# Patient Record
Sex: Female | Born: 1950
Health system: Southern US, Community
[De-identification: ages and names within clinical notes are randomized; demographics above are authoritative.]

## PROBLEM LIST (undated history)

## (undated) DIAGNOSIS — I4510 Unspecified right bundle-branch block: Secondary | ICD-10-CM

## (undated) DIAGNOSIS — J45909 Unspecified asthma, uncomplicated: Secondary | ICD-10-CM

## (undated) DIAGNOSIS — F419 Anxiety disorder, unspecified: Secondary | ICD-10-CM

## (undated) DIAGNOSIS — M1990 Unspecified osteoarthritis, unspecified site: Secondary | ICD-10-CM

## (undated) DIAGNOSIS — E039 Hypothyroidism, unspecified: Secondary | ICD-10-CM

## (undated) DIAGNOSIS — H1010 Acute atopic conjunctivitis, unspecified eye: Secondary | ICD-10-CM

## (undated) DIAGNOSIS — F330 Major depressive disorder, recurrent, mild: Secondary | ICD-10-CM

## (undated) DIAGNOSIS — G4730 Sleep apnea, unspecified: Secondary | ICD-10-CM

## (undated) DIAGNOSIS — G2581 Restless legs syndrome: Secondary | ICD-10-CM

## (undated) DIAGNOSIS — I10 Essential (primary) hypertension: Secondary | ICD-10-CM

## (undated) DIAGNOSIS — E7800 Pure hypercholesterolemia, unspecified: Secondary | ICD-10-CM

## (undated) DIAGNOSIS — K5792 Diverticulitis of intestine, part unspecified, without perforation or abscess without bleeding: Secondary | ICD-10-CM

## (undated) DIAGNOSIS — H409 Unspecified glaucoma: Secondary | ICD-10-CM

## (undated) DIAGNOSIS — G43909 Migraine, unspecified, not intractable, without status migrainosus: Secondary | ICD-10-CM

## (undated) DIAGNOSIS — H269 Unspecified cataract: Secondary | ICD-10-CM

## (undated) DIAGNOSIS — R413 Other amnesia: Secondary | ICD-10-CM

## (undated) DIAGNOSIS — C4490 Unspecified malignant neoplasm of skin, unspecified: Secondary | ICD-10-CM

## (undated) DIAGNOSIS — R079 Chest pain, unspecified: Secondary | ICD-10-CM

## (undated) DIAGNOSIS — K219 Gastro-esophageal reflux disease without esophagitis: Secondary | ICD-10-CM

## (undated) DIAGNOSIS — A64 Unspecified sexually transmitted disease: Secondary | ICD-10-CM

## (undated) DIAGNOSIS — R011 Cardiac murmur, unspecified: Secondary | ICD-10-CM

## (undated) DIAGNOSIS — L4050 Arthropathic psoriasis, unspecified: Secondary | ICD-10-CM

## (undated) DIAGNOSIS — F329 Major depressive disorder, single episode, unspecified: Secondary | ICD-10-CM

## (undated) DIAGNOSIS — M8580 Other specified disorders of bone density and structure, unspecified site: Secondary | ICD-10-CM

## (undated) DIAGNOSIS — F32A Depression, unspecified: Secondary | ICD-10-CM

## (undated) DIAGNOSIS — G4700 Insomnia, unspecified: Secondary | ICD-10-CM

## (undated) DIAGNOSIS — J309 Allergic rhinitis, unspecified: Secondary | ICD-10-CM

## (undated) HISTORY — DX: Essential (primary) hypertension: I10

## (undated) HISTORY — DX: Restless legs syndrome: G25.81

## (undated) HISTORY — DX: Gastro-esophageal reflux disease without esophagitis: K21.9

## (undated) HISTORY — DX: Chest pain, unspecified: R07.9

## (undated) HISTORY — PX: SKIN CANCER EXCISION: SHX779

## (undated) HISTORY — DX: Pure hypercholesterolemia, unspecified: E78.00

## (undated) HISTORY — DX: Anxiety disorder, unspecified: F41.9

## (undated) HISTORY — DX: Arthropathic psoriasis, unspecified: L40.50

## (undated) HISTORY — DX: Unspecified malignant neoplasm of skin, unspecified: C44.90

## (undated) HISTORY — DX: Unspecified sexually transmitted disease: A64

## (undated) HISTORY — DX: Unspecified osteoarthritis, unspecified site: M19.90

## (undated) HISTORY — DX: Migraine, unspecified, not intractable, without status migrainosus: G43.909

## (undated) HISTORY — DX: Hypothyroidism, unspecified: E03.9

## (undated) HISTORY — DX: Other amnesia: R41.3

## (undated) HISTORY — PX: GANGLION CYST EXCISION: SHX1691

## (undated) HISTORY — DX: Unspecified cataract: H26.9

## (undated) HISTORY — DX: Unspecified right bundle-branch block: I45.10

## (undated) HISTORY — DX: Major depressive disorder, recurrent, mild: F33.0

## (undated) HISTORY — DX: Unspecified glaucoma: H40.9

## (undated) HISTORY — PX: BELPHAROPTOSIS REPAIR: SHX369

## (undated) HISTORY — DX: Diverticulitis of intestine, part unspecified, without perforation or abscess without bleeding: K57.92

## (undated) HISTORY — DX: Other specified disorders of bone density and structure, unspecified site: M85.80

## (undated) HISTORY — DX: Major depressive disorder, single episode, unspecified: F32.9

## (undated) HISTORY — DX: Depression, unspecified: F32.A

## (undated) HISTORY — PX: BREAST SURGERY: SHX581

## (undated) HISTORY — DX: Acute atopic conjunctivitis, unspecified eye: H10.10

## (undated) HISTORY — DX: Sleep apnea, unspecified: G47.30

## (undated) HISTORY — PX: FOOT SURGERY: SHX648

## (undated) HISTORY — DX: Insomnia, unspecified: G47.00

## (undated) HISTORY — DX: Unspecified asthma, uncomplicated: J45.909

## (undated) HISTORY — PX: HEMORRHOID SURGERY: SHX153

## (undated) HISTORY — DX: Allergic rhinitis, unspecified: J30.9

---

## 1999-01-05 ENCOUNTER — Ambulatory Visit (HOSPITAL_COMMUNITY): Admission: RE | Admit: 1999-01-05 | Discharge: 1999-01-05 | Payer: Self-pay | Admitting: *Deleted

## 2000-06-07 ENCOUNTER — Ambulatory Visit (HOSPITAL_COMMUNITY): Admission: RE | Admit: 2000-06-07 | Discharge: 2000-06-07 | Payer: Self-pay | Admitting: Obstetrics and Gynecology

## 2000-06-07 ENCOUNTER — Other Ambulatory Visit: Admission: RE | Admit: 2000-06-07 | Discharge: 2000-06-07 | Payer: Self-pay | Admitting: Obstetrics and Gynecology

## 2000-06-07 ENCOUNTER — Encounter: Payer: Self-pay | Admitting: Obstetrics and Gynecology

## 2001-06-12 ENCOUNTER — Ambulatory Visit (HOSPITAL_COMMUNITY): Admission: RE | Admit: 2001-06-12 | Discharge: 2001-06-12 | Payer: Self-pay | Admitting: Obstetrics and Gynecology

## 2001-06-12 ENCOUNTER — Encounter: Payer: Self-pay | Admitting: Obstetrics and Gynecology

## 2001-06-19 ENCOUNTER — Other Ambulatory Visit: Admission: RE | Admit: 2001-06-19 | Discharge: 2001-06-19 | Payer: Self-pay | Admitting: Obstetrics and Gynecology

## 2001-10-01 ENCOUNTER — Encounter: Admission: RE | Admit: 2001-10-01 | Discharge: 2001-10-01 | Payer: Self-pay | Admitting: Obstetrics and Gynecology

## 2001-10-01 ENCOUNTER — Encounter: Payer: Self-pay | Admitting: Obstetrics and Gynecology

## 2001-10-08 ENCOUNTER — Ambulatory Visit (HOSPITAL_BASED_OUTPATIENT_CLINIC_OR_DEPARTMENT_OTHER): Admission: RE | Admit: 2001-10-08 | Discharge: 2001-10-08 | Payer: Self-pay | Admitting: General Surgery

## 2001-10-08 ENCOUNTER — Encounter (INDEPENDENT_AMBULATORY_CARE_PROVIDER_SITE_OTHER): Payer: Self-pay | Admitting: *Deleted

## 2002-07-24 ENCOUNTER — Other Ambulatory Visit: Admission: RE | Admit: 2002-07-24 | Discharge: 2002-07-24 | Payer: Self-pay | Admitting: Obstetrics and Gynecology

## 2002-10-02 ENCOUNTER — Ambulatory Visit (HOSPITAL_COMMUNITY): Admission: RE | Admit: 2002-10-02 | Discharge: 2002-10-02 | Payer: Self-pay | Admitting: Obstetrics and Gynecology

## 2002-10-02 ENCOUNTER — Encounter: Payer: Self-pay | Admitting: Obstetrics and Gynecology

## 2003-10-07 ENCOUNTER — Other Ambulatory Visit: Admission: RE | Admit: 2003-10-07 | Discharge: 2003-10-07 | Payer: Self-pay | Admitting: Obstetrics and Gynecology

## 2003-10-07 ENCOUNTER — Ambulatory Visit (HOSPITAL_COMMUNITY): Admission: RE | Admit: 2003-10-07 | Discharge: 2003-10-07 | Payer: Self-pay | Admitting: Obstetrics and Gynecology

## 2004-12-07 ENCOUNTER — Ambulatory Visit (HOSPITAL_COMMUNITY): Admission: RE | Admit: 2004-12-07 | Discharge: 2004-12-07 | Payer: Self-pay | Admitting: Obstetrics and Gynecology

## 2004-12-07 ENCOUNTER — Other Ambulatory Visit: Admission: RE | Admit: 2004-12-07 | Discharge: 2004-12-07 | Payer: Self-pay | Admitting: Obstetrics and Gynecology

## 2005-12-21 ENCOUNTER — Other Ambulatory Visit: Admission: RE | Admit: 2005-12-21 | Discharge: 2005-12-21 | Payer: Self-pay | Admitting: Obstetrics & Gynecology

## 2005-12-21 ENCOUNTER — Ambulatory Visit (HOSPITAL_COMMUNITY): Admission: RE | Admit: 2005-12-21 | Discharge: 2005-12-21 | Payer: Self-pay | Admitting: Obstetrics & Gynecology

## 2006-11-15 ENCOUNTER — Emergency Department (HOSPITAL_COMMUNITY): Admission: EM | Admit: 2006-11-15 | Discharge: 2006-11-16 | Payer: Self-pay | Admitting: Emergency Medicine

## 2007-05-30 ENCOUNTER — Other Ambulatory Visit: Admission: RE | Admit: 2007-05-30 | Discharge: 2007-05-30 | Payer: Self-pay | Admitting: Obstetrics & Gynecology

## 2007-09-10 ENCOUNTER — Ambulatory Visit (HOSPITAL_COMMUNITY): Admission: RE | Admit: 2007-09-10 | Discharge: 2007-09-10 | Payer: Self-pay | Admitting: Obstetrics & Gynecology

## 2008-06-05 ENCOUNTER — Other Ambulatory Visit: Admission: RE | Admit: 2008-06-05 | Discharge: 2008-06-05 | Payer: Self-pay | Admitting: Obstetrics & Gynecology

## 2008-10-02 ENCOUNTER — Ambulatory Visit (HOSPITAL_COMMUNITY): Admission: RE | Admit: 2008-10-02 | Discharge: 2008-10-02 | Payer: Self-pay | Admitting: Obstetrics & Gynecology

## 2009-10-06 ENCOUNTER — Ambulatory Visit (HOSPITAL_COMMUNITY): Admission: RE | Admit: 2009-10-06 | Discharge: 2009-10-06 | Payer: Self-pay | Admitting: Obstetrics & Gynecology

## 2009-10-22 ENCOUNTER — Ambulatory Visit (HOSPITAL_COMMUNITY): Admission: RE | Admit: 2009-10-22 | Discharge: 2009-10-22 | Payer: Self-pay | Admitting: Obstetrics & Gynecology

## 2010-10-29 ENCOUNTER — Other Ambulatory Visit: Payer: Self-pay | Admitting: Obstetrics & Gynecology

## 2010-10-29 DIAGNOSIS — Z1231 Encounter for screening mammogram for malignant neoplasm of breast: Secondary | ICD-10-CM

## 2010-11-25 ENCOUNTER — Ambulatory Visit (HOSPITAL_COMMUNITY)
Admission: RE | Admit: 2010-11-25 | Discharge: 2010-11-25 | Disposition: A | Discharge status: Home / Self Care | Payer: BC Managed Care – PPO | Source: Ambulatory Visit | Attending: Obstetrics & Gynecology | Admitting: Obstetrics & Gynecology

## 2010-11-25 DIAGNOSIS — Z1231 Encounter for screening mammogram for malignant neoplasm of breast: Secondary | ICD-10-CM | POA: Insufficient documentation

## 2011-01-07 NOTE — Op Note (Signed)
[place]. [facility]  Patient:    AMITY, ROES Visit Number: [id number] MRN: [id number]          Service Type: DSU Location: [facility] Attending Physician:  Janalyn Rouse Dictated by:   Rose Phi. Maple Hudson, M.D. Proc. Date: 10/08/01 Admit Date:  10/08/2001   CC:         Cynthia P. Ashley Royalty, M.D.                           Operative Report  PREOPERATIVE DIAGNOSIS:  Intraductal papilloma of the left breast.  POSTOPERATIVE DIAGNOSIS:  Intraductal papilloma of the left breast.  PROCEDURE:  Excision of duct system at the 6 oclock position of the left breast.  SURGEON:  Rose Phi. Maple Hudson, M.D.  ANESTHESIA:  MAC.  INDICATIONS:  The patient had presented with a nipple discharge from the 6 oclock position of the left breast and an intraductal papilloma, subareolar in position, had been identified on a ductogram.  She was scheduled for excision.  DESCRIPTION OF PROCEDURE:  The patient was placed on the operating table with the left arm extended on the arm board.  The left breast was prepped and draped in the usual fashion.  A circumareolar incision centered at the 6 oclock position was then outlined and infiltrated with 1% Xylocaine with adrenalin.  The incision was made and the duct system going to the 6 oclock position was excised.  One could then see the papilloma extruding from the duct.  No others could be identified.  Hemostasis was obtained with the cautery.  A subcuticular closure of 4-0 Monocryl and Steri-Strips were carried out.  Dressing was applied.  The patient was transferred to the recovery room in satisfactory condition having tolerated the procedure well. Dictated by: Rose Phi. Maple Hudson, M.D. Attending Physician:  Janalyn Rouse DD:  10/08/01 TD:  10/08/01 Job: [id number]

## 2012-11-16 ENCOUNTER — Telehealth: Payer: Self-pay | Admitting: Certified Nurse Midwife

## 2012-11-16 NOTE — Telephone Encounter (Signed)
PT IS REQUESTING REFILL FOR ESTRACE BE CALLED INTO A DIFFERENT PHARMACY PLEASE CALL THIS INTO [facility] @  [phone]

## 2012-11-16 NOTE — Telephone Encounter (Signed)
11/16/12 left message for patient to callback. Pt should be able to call previous pharmacy & just have rx transferred

## 2012-11-16 NOTE — Telephone Encounter (Signed)
11/16/12 @ 4:34pm left message for patient letting her know that she can call the pharmacy her rx is currently at & have it transferred to her pharmacy in [place]. Pt told to callback if she has any problems with this.

## 2012-11-21 ENCOUNTER — Telehealth: Payer: Self-pay | Admitting: Certified Nurse Midwife

## 2012-11-21 NOTE — Telephone Encounter (Signed)
Pt needs to have her MMG completed. She wants to go to [facility] in [place]. She needs an order put in to have it completed.

## 2012-11-22 NOTE — Telephone Encounter (Signed)
SEE NOTE BELOW, IS THIS OK TO FAX FORMS TO [facility]. CHART ON YOUR DESK. LAST MAMMOGRAM   11/2011. LAST AEX 02/14/2012  SUE

## 2012-11-23 ENCOUNTER — Telehealth: Payer: Self-pay | Admitting: *Deleted

## 2012-11-26 ENCOUNTER — Telehealth: Payer: Self-pay | Admitting: *Deleted

## 2012-11-26 NOTE — Telephone Encounter (Signed)
Patient request mammogram to be done at [facility] which is closer to her home. Signed order form per Presley Raddle, and faxed to [facility]. Fax confirmed 11/23/2012  sue

## 2012-11-26 NOTE — Telephone Encounter (Signed)
Patient scheduled for digital Screening Mammogram on 12/08/2012 @ 9:30am @ [facility]. Patient aware of this. Chart in file till report comes back. Olivia Cruz

## 2013-02-14 ENCOUNTER — Encounter: Payer: Self-pay | Admitting: *Deleted

## 2013-02-18 ENCOUNTER — Ambulatory Visit (INDEPENDENT_AMBULATORY_CARE_PROVIDER_SITE_OTHER): Payer: BC Managed Care – PPO | Admitting: Certified Nurse Midwife

## 2013-02-18 ENCOUNTER — Encounter: Payer: Self-pay | Admitting: Certified Nurse Midwife

## 2013-02-18 VITALS — BP 102/62 | HR 64 | Resp 16 | Ht 61.25 in | Wt 134.0 lb

## 2013-02-18 DIAGNOSIS — A6000 Herpesviral infection of urogenital system, unspecified: Secondary | ICD-10-CM

## 2013-02-18 DIAGNOSIS — N952 Postmenopausal atrophic vaginitis: Secondary | ICD-10-CM

## 2013-02-18 DIAGNOSIS — Z0000 Encounter for general adult medical examination without abnormal findings: Secondary | ICD-10-CM

## 2013-02-18 DIAGNOSIS — A6009 Herpesviral infection of other urogenital tract: Secondary | ICD-10-CM

## 2013-02-18 DIAGNOSIS — Z01419 Encounter for gynecological examination (general) (routine) without abnormal findings: Secondary | ICD-10-CM

## 2013-02-18 LAB — LIPID PANEL
Cholesterol: 123 mg/dL (ref 0–200)
HDL: 59 mg/dL (ref >39)
LDL Cholesterol: 48 mg/dL (ref 0–99)
Total CHOL/HDL Ratio: 2.1 Ratio
Triglycerides: 82 mg/dL (ref <150)
VLDL: 16 mg/dL (ref 0–40)

## 2013-02-18 LAB — COMPREHENSIVE METABOLIC PANEL
ALT: 16 U/L (ref 0–35)
AST: 26 U/L (ref 0–37)
Albumin: 4.3 g/dL (ref 3.5–5.2)
Alkaline Phosphatase: 73 U/L (ref 39–117)
BUN: 14 mg/dL (ref 6–23)
CO2: 27 mEq/L (ref 19–32)
Calcium: 9 mg/dL (ref 8.4–10.5)
Chloride: 106 mEq/L (ref 96–112)
Creat: 0.71 mg/dL (ref 0.50–1.10)
Glucose, Bld: 90 mg/dL (ref 70–99)
Potassium: 4.1 mEq/L (ref 3.5–5.3)
Sodium: 141 mEq/L (ref 135–145)
Total Bilirubin: 0.4 mg/dL (ref 0.3–1.2)
Total Protein: 6.6 g/dL (ref 6.0–8.3)

## 2013-02-18 LAB — POCT URINALYSIS DIPSTICK
Bilirubin, UA: NEGATIVE
Blood, UA: NEGATIVE
Glucose, UA: NEGATIVE
Ketones, UA: NEGATIVE
Leukocytes, UA: NEGATIVE
Nitrite, UA: NEGATIVE
Protein, UA: NEGATIVE
Urobilinogen, UA: NEGATIVE
pH, UA: 5

## 2013-02-18 LAB — TSH: TSH: 3.489 u[IU]/mL (ref 0.350–4.500)

## 2013-02-18 LAB — HEMOGLOBIN, FINGERSTICK: Hemoglobin, fingerstick: 14.6 g/dL (ref 12.0–16.0)

## 2013-02-18 MED ORDER — VALACYCLOVIR HCL 500 MG PO TABS: 500.0000 mg | ORAL_TABLET | Freq: Two times a day (BID) | ORAL | Status: DC

## 2013-02-18 MED ORDER — NITROFURANTOIN MONOHYD MACRO 100 MG PO CAPS: 100.0000 mg | ORAL_CAPSULE | ORAL | Status: DC | PRN

## 2013-02-18 MED ORDER — ESTRADIOL 0.1 MG/GM VA CREA: 1.0000 g | TOPICAL_CREAM | VAGINAL | Status: DC

## 2013-02-18 NOTE — Progress Notes (Signed)
62 y.o. G4P2 Married Caucasian Fe here for annual exam.  Menopausal no HRT. Denies vaginal bleeding.  Estrace cream working well for vaginal dryness and UTI prevention. Using Macrobid for post coital use with last UTI 8/13! Spouse with new diagnosis of diabetes, but patient working with him more since retiring this year. Patient had dog bite earlier this year with treatment(doing rescue dogs now), so saw PCP. No health issues today. Sees PCP prn only.  No LMP recorded. Patient is postmenopausal.          Sexually active: yes  The current method of family planning is vasectomy.    Exercising: yes  walking & bike Smoker:  no  Health Maintenance: Pap: 02-14-12 neg HPV HR NEG MMG:  12/04/12 normal Colonoscopy:  2004 BMD:   2011 TDaP:  4/14 Labs: Poct urine-neg , Hgb-14.6 Self breast exam: not done     Past Medical History  Diagnosis Date  . STD (sexually transmitted disease)     HSV II  . Insomnia   . Osteoarthritis   . Anxiety   . Depression   . Migraines     No past surgical history on file.  Current Outpatient Prescriptions  Medication Sig Dispense Refill  . calcium-vitamin D (OSCAL WITH D) 500-200 MG-UNIT per tablet Take 1 tablet by mouth.      . citalopram (CELEXA) 10 MG tablet Take 10 mg by mouth daily.      Marland Kitchen estradiol (ESTRACE) 0.1 MG/GM vaginal cream Place 2 g vaginally daily.      . fish oil-omega-3 fatty acids 1000 MG capsule Take 2 g by mouth daily.      . Multiple Vitamin (MULTIVITAMIN) capsule Take 1 capsule by mouth daily.      . nitrofurantoin, macrocrystal-monohydrate, (MACROBID) 100 MG capsule Take 100 mg by mouth 2 (two) times daily.      . TRAZODONE HCL PO Take by mouth.      . valACYclovir (VALTREX) 500 MG tablet Take 500 mg by mouth 2 (two) times daily.       No current facility-administered medications for this visit.    Family History  Problem Relation Age of Onset  . Thyroid disease Sister   . Cancer Maternal Grandmother     leukemia  . Multiple  births Paternal Grandmother     ROS:  Pertinent items are noted in HPI.  Otherwise, a comprehensive ROS was negative.  Exam:   There were no vitals taken for this visit.    Ht Readings from Last 3 Encounters:  No data found for Ht    General appearance: alert, cooperative and appears stated age Head: Normocephalic, without obvious abnormality, atraumatic Neck: no adenopathy, supple, symmetrical, trachea midline and thyroid normal to inspection and palpation Lungs: clear to auscultation bilaterally Breasts: normal appearance, no masses or tenderness, No nipple retraction or dimpling, No nipple discharge or bleeding, No axillary or supraclavicular adenopathy Heart: regular rate and rhythm Abdomen: soft, non-tender; no masses,  no organomegaly Extremities: extremities normal, atraumatic, no cyanosis or edema Skin: Skin color, texture, turgor normal. No rashes or lesions Lymph nodes: Cervical, supraclavicular, and axillary nodes normal. No abnormal inguinal nodes palpated Neurologic: Grossly normal   Pelvic: External genitalia:  no lesions              Urethra:  normal appearing urethra with no masses, tenderness or lesions              Bartholin's and Skene's: normal  Vagina: normal appearing vagina with normal color and discharge, no lesions              Cervix: normal, non tender              Pap taken: no Bimanual Exam:  Uterus:  normal size, contour, position, consistency, mobility, non-tender and anteflexed              Adnexa: normal adnexa and no mass, fullness, tenderness               Rectovaginal: Confirms               Anus:  normal sphincter tone, no lesions  A:  Well Woman with normal exam  Menopausal no HRT  Atrophic Vaginitis using Estrace cream with improvement  History of post-coital UTI, Macrobid working well  Family history of breast cancer(sister 60)  History of HSV II on valtrex for outbreak only needs Rx  Social stress with spouse  diabetes  P:  Reviewed health and wellness pertinent to exam  Aware of need to notify if any vaginal bleeding  Rx Estrace Cream see order  Discussed continued use post coital as needed  Rx Macrobid see order  Stressed importance of mammogram and self breast exam  Rx Valtrex  Discussed working with spouse and care provider. Seek support with  family  Fasting labs: CMP,Lipid panel, Vit.D, TSH   Pap smear as per guidelines Pap next year  Mammogram yearly  Due for colonoscopy this year has received her card to schedule,plans to call pap smear not taken today  counseled on breast self exam, mammography screening, menopause, adequate intake of calcium and vitamin D, diet and exercise  return annually or prn  An After Visit Summary was printed and given to the patient.

## 2013-02-18 NOTE — Patient Instructions (Signed)

## 2013-02-19 LAB — VITAMIN D 25 HYDROXY (VIT D DEFICIENCY, FRACTURES): Vit D, 25-Hydroxy: 60 ng/mL (ref 30–89)

## 2013-02-19 NOTE — Progress Notes (Signed)
Reviewed CPRomine, MD 

## 2013-02-20 ENCOUNTER — Telehealth: Payer: Self-pay

## 2013-02-20 NOTE — Telephone Encounter (Signed)
Message copied by Eliezer Bottom on Wed Feb 20, 2013  9:09 AM ------      Message from: Verner Chol      Created: Wed Feb 20, 2013  8:00 AM       Notify lipid panel normal, liver, kidney and glucose panel normal      TSH normal, Vitamin D normal ------

## 2013-02-20 NOTE — Telephone Encounter (Signed)
lmtcb

## 2013-02-20 NOTE — Telephone Encounter (Signed)
Patient notified of results.

## 2013-06-03 HISTORY — PX: COLONOSCOPY: SHX174

## 2013-11-25 ENCOUNTER — Encounter: Payer: Self-pay | Admitting: Gastroenterology

## 2013-11-25 ENCOUNTER — Telehealth: Payer: Self-pay | Admitting: Certified Nurse Midwife

## 2013-11-25 NOTE — Telephone Encounter (Signed)
Spoke with patient. Advised of Estrace cream savings card. Patient would like to try savings card before switching medications stating that "Estrace has really worked well for me but I just can't afford to pay for it every month." Advised she could try savings card and give [place] a call back if she would like to take a different route.Went on [employer] and printed out Estrace savings card for patient. Offered to leave at front desk for pick up or that patient could get one online. Patient states that she would try to come by today and pick it up from our office. Savings card left at front desk.   Routing to provider for final review. Patient agreeable to disposition. Will close encounter

## 2013-11-25 NOTE — Telephone Encounter (Signed)
Patient wants to know if there is another medication for estrace it is too high and she cant afford it.

## 2013-11-26 ENCOUNTER — Telehealth: Payer: Self-pay | Admitting: Emergency Medicine

## 2013-11-26 NOTE — Telephone Encounter (Signed)
Encounter opened erroneously.   Closed encounter.   

## 2013-11-26 NOTE — Telephone Encounter (Signed)
Agree with plan 

## 2013-11-27 ENCOUNTER — Telehealth: Payer: Self-pay | Admitting: Certified Nurse Midwife

## 2013-11-27 NOTE — Telephone Encounter (Signed)
There is nothing else that will be less expensive that I am aware of.

## 2013-11-27 NOTE — Telephone Encounter (Signed)
Called [employer] and [employer] savings plans. Patient has high deducible plan from [facility] with 11,000.00 deductible and has so far met $204.16 of deductible. Will advise patient that Estrace is a 90 day supply.   Message left to return call to [place] at [phone].

## 2013-11-27 NOTE — Telephone Encounter (Signed)
Pt called to see what kind of discount card we had for her. She went to the pharmacy with a discount card she printed from the Internet and was told this discount card is based off of the kind of insurance you have. The discount is still not enough because she will have to pay$159.00. What else can she use?

## 2013-11-27 NOTE — Telephone Encounter (Signed)
Regina Eck CNM, Is there a similar medication we could offer patient to reduce cost as the savings card we have for her is the same one she used at pharmacy?

## 2013-11-28 NOTE — Telephone Encounter (Signed)
Spoke with patient. Advised that we contacted both [employer]bs and [employer] and this choice with savings cards with be most cost effective as patient has high deductible plan. Advised that Estrace tube will be 90 supply and she will need to pay at cost until she meets deductible. She is agreeable to plan and will be seen for AEX to discuss further options with Regina Eck CNM  Routing to provider for final review. Patient agreeable to disposition. Will close encounter

## 2014-02-19 ENCOUNTER — Encounter: Payer: Self-pay | Admitting: Certified Nurse Midwife

## 2014-02-19 ENCOUNTER — Ambulatory Visit (INDEPENDENT_AMBULATORY_CARE_PROVIDER_SITE_OTHER): Payer: BC Managed Care – PPO | Admitting: Certified Nurse Midwife

## 2014-02-19 VITALS — BP 110/64 | HR 64 | Resp 16 | Ht 61.0 in | Wt 133.0 lb

## 2014-02-19 DIAGNOSIS — N393 Stress incontinence (female) (male): Secondary | ICD-10-CM

## 2014-02-19 DIAGNOSIS — IMO0002 Reserved for concepts with insufficient information to code with codable children: Secondary | ICD-10-CM

## 2014-02-19 DIAGNOSIS — N8111 Cystocele, midline: Secondary | ICD-10-CM

## 2014-02-19 DIAGNOSIS — N952 Postmenopausal atrophic vaginitis: Secondary | ICD-10-CM

## 2014-02-19 DIAGNOSIS — Z01419 Encounter for gynecological examination (general) (routine) without abnormal findings: Secondary | ICD-10-CM

## 2014-02-19 DIAGNOSIS — N390 Urinary tract infection, site not specified: Secondary | ICD-10-CM

## 2014-02-19 DIAGNOSIS — Z0000 Encounter for general adult medical examination without abnormal findings: Secondary | ICD-10-CM

## 2014-02-19 DIAGNOSIS — Z124 Encounter for screening for malignant neoplasm of cervix: Secondary | ICD-10-CM

## 2014-02-19 LAB — POCT URINALYSIS DIPSTICK
Bilirubin, UA: NEGATIVE
Blood, UA: NEGATIVE
Glucose, UA: NEGATIVE
Ketones, UA: NEGATIVE
Leukocytes, UA: NEGATIVE
Nitrite, UA: NEGATIVE
Protein, UA: NEGATIVE
Urobilinogen, UA: NEGATIVE
pH, UA: 5

## 2014-02-19 LAB — HEMOGLOBIN, FINGERSTICK: Hemoglobin, fingerstick: 14.2 g/dL (ref 12.0–16.0)

## 2014-02-19 MED ORDER — ESTRADIOL 0.1 MG/GM VA CREA: 1.0000 g | TOPICAL_CREAM | VAGINAL | Status: DC

## 2014-02-19 MED ORDER — NITROFURANTOIN MONOHYD MACRO 100 MG PO CAPS: 100.0000 mg | ORAL_CAPSULE | ORAL | Status: DC | PRN

## 2014-02-19 NOTE — Patient Instructions (Signed)

## 2014-02-19 NOTE — Progress Notes (Signed)
63 y.o. [id number] Married Caucasian Fe here for annual exam. Menopausal vaginal bleeding or vaginal dryness. Patient using Estrace cream with good results. Patient using Macrobid for post coital UTI prevention, with no UTI in 6 months. Patient now being treated for URI/sinus from visit with PCP. Sees PCP for labs and aex. Stress incontinence has increased with coughing only, usually just occasionally. She has been working on her kegel exercise to help and has noticed improvement..No other health issues today. Has new grand daughter in [place]!!  Patient's last menstrual period was 08/22/2005.          Sexually active: Yes.    The current method of family planning is vasectomy.    Exercising: Yes.    walking Smoker:  no  Health Maintenance: Pap: 02-14-12 neg HPV HR neg MMG: 01-22-14 density category c, bi-rads category 1: neg Colonoscopy:  2014 repeat 5 yrs BMD:   2011 TDaP:  4/14 Labs: Poct urine-neg, Hgb-14.2 Self breast exam: done every other month   reports that she has quit smoking. She does not have any smokeless tobacco history on file. She reports that she does not drink alcohol or use illicit drugs.  Past Medical History  Diagnosis Date  . STD (sexually transmitted disease)     HSV II  . Insomnia   . Osteoarthritis   . Anxiety   . Depression   . Migraines     Past Surgical History  Procedure Laterality Date  . Breast surgery Left     times 2  . Ganglion cyst excision Left     foot  . Hemorrhoid surgery      Current Outpatient Prescriptions  Medication Sig Dispense Refill  . Calcium Carbonate-Vitamin D (CALCIUM + D PO) Take 1,200 mg by mouth daily.      Marland Kitchen estradiol (ESTRACE) 0.1 MG/GM vaginal cream Place 5.46 Applicatorfuls vaginally 2 (two) times a week. Use 1 gram Twice a week  42.5 g  4  . Multiple Vitamin (MULTIVITAMIN) capsule Take 1 capsule by mouth daily.      . nitrofurantoin, macrocrystal-monohydrate, (MACROBID) 100 MG capsule Take 1 capsule (100 mg total) by  mouth as needed. One  post coital as directed  30 capsule  2  . valACYclovir (VALTREX) 500 MG tablet Take 1 tablet (500 mg total) by mouth 2 (two) times daily. Take bid x 3 days at onset  30 tablet  1   No current facility-administered medications for this visit.    Family History  Problem Relation Age of Onset  . Breast cancer Sister     lung  . Cancer Maternal Grandmother   . Multiple births Maternal Grandmother   . Multiple births Paternal Grandmother   . Cancer Paternal Grandmother   . Thyroid disease Mother   . Heart failure Mother   . Parkinson's disease Father     ROS:  Pertinent items are noted in HPI.  Otherwise, a comprehensive ROS was negative.  Exam:   BP 110/64  Pulse 64  Resp 16  Ht 5\' 1"  (1.549 m)  Wt 133 lb (60.328 kg)  BMI 25.14 kg/m2  LMP 08/22/2005 Height: 5\' 1"  (154.9 cm)  Ht Readings from Last 3 Encounters:  02/19/14 5\' 1"  (1.549 m)  02/18/13 5' 1.25" (1.556 m)    General appearance: alert, cooperative and appears stated age Head: Normocephalic, without obvious abnormality, atraumatic Neck: no adenopathy, supple, symmetrical, trachea midline and thyroid normal to inspection and palpation and non-palpable Lungs: clear to auscultation bilaterally Breasts: normal  appearance, no masses or tenderness, No nipple retraction or dimpling, No nipple discharge or bleeding, No axillary or supraclavicular adenopathy Heart: regular rate and rhythm Abdomen: soft, non-tender; no masses,  no organomegaly Extremities: extremities normal, atraumatic, no cyanosis or edema Skin: Skin color, texture, turgor normal. No rashes or lesions Lymph nodes: Cervical, supraclavicular, and axillary nodes normal. No abnormal inguinal nodes palpated Neurologic: Grossly normal   Pelvic: External genitalia:  no lesions              Urethra:  normal appearing urethra with no masses, tenderness or lesions              Bartholin's and Skene's: normal                 Vagina: normal  appearing vagina with normal color and discharge, no lesions, mild cystocele noted              Cervix: normal, non tender              Pap taken: Yes.   Bimanual Exam:  Uterus:  normal size, contour, position, consistency, mobility, non-tender and anteverted              Adnexa: normal adnexa and no mass, fullness, tenderness               Rectovaginal: Confirms               Anus:  normal sphincter tone, no lesions  A:  Well Woman with normal exam  Menopausal no HRT  Atrophic vaginitis using Estrace cream 2 x weekly with good success  Post Coital UTI history with Macrobid use with good response  Category C density breasts on recent mammogram  URI/sinusitis unde treatment with PCP  Mild cystocele  P:   Reviewed health and wellness pertinent to exam  Aware of need to evaluate if vaginal bleeding  Rx Estrace see order  Rx Macrobid see order  Discussed importance of 3 D mammogram for better visualization with dense breast and etiology of.  Continue follow up as indicated  Discussed findings and need to continue to work with kegels, which were appropriate support on exam today. Limit holding urine for long periods of time which increases stress incontinence. Will advise if problems  Pap smear taken today with HPV reflex  Plans BMD with next mammogram   counseled on breast self exam, mammography screening, adequate intake of calcium and vitamin D, diet and exercise, Kegel's exercises  return annually or prn  An After Visit Summary was printed and given to the patient.

## 2014-02-24 LAB — IPS PAP TEST WITH REFLEX TO HPV

## 2014-02-24 NOTE — Progress Notes (Signed)
Reviewed personally.  M. Suzanne Shresta Risden, MD.  

## 2014-06-23 ENCOUNTER — Encounter: Payer: Self-pay | Admitting: Certified Nurse Midwife

## 2014-08-18 ENCOUNTER — Other Ambulatory Visit: Payer: Self-pay | Admitting: Certified Nurse Midwife

## 2014-08-18 DIAGNOSIS — A6009 Herpesviral infection of other urogenital tract: Secondary | ICD-10-CM

## 2014-08-18 MED ORDER — VALACYCLOVIR HCL 500 MG PO TABS: 500.0000 mg | ORAL_TABLET | Freq: Two times a day (BID) | ORAL | Status: DC

## 2014-08-18 NOTE — Telephone Encounter (Signed)
Pt is requesting a refill for valtrex. Pt has some outdated medication and will not have insurance for the next 5 months so she would like to request a refill now.

## 2014-08-18 NOTE — Telephone Encounter (Signed)
Medication refill request: Valtrex 500 mg  Last AEX:  02/19/14 with Ms. Debbie  Next AEX: 02/24/15 with Ms. Debbie Last MMG (if hormonal medication request): N/A Refill authorized: #[id number] rfs?, please advise.  Routed to [facility] since DL is out office today.

## 2014-08-19 NOTE — Telephone Encounter (Signed)
LM on patient's voicemail that rx has been sent.

## 2015-02-24 ENCOUNTER — Ambulatory Visit: Payer: BC Managed Care – PPO | Admitting: Certified Nurse Midwife

## 2015-03-16 ENCOUNTER — Telehealth: Payer: Self-pay | Admitting: Certified Nurse Midwife

## 2015-03-16 NOTE — Telephone Encounter (Signed)
Spoke with patient. Patient states that her and her husband had intercourse yesterday which was mildly painful. Went to the restroom after intercourse and noticed "Dark red" bleeding. Denies any current discomfort or pain. States she is still having spotting today. Advised will need to be seen in office for further evaluation. Offered appointment today but patient declines due to a prior engagement she has scheduled. Appointment scheduled for tomorrow 7/26 at 12:45pm with Regina Eck CNM. Patient is agreeable to date and time.  Routing to provider for final review. Patient agreeable to disposition. Will close encounter.   Patient aware provider will review message and nurse will return call if any additional advice or change of disposition.

## 2015-03-16 NOTE — Telephone Encounter (Signed)
Patient calling with "post menopausal bleeding after sexual intercourse yesterday."

## 2015-03-17 ENCOUNTER — Ambulatory Visit (INDEPENDENT_AMBULATORY_CARE_PROVIDER_SITE_OTHER): Payer: PPO | Admitting: Certified Nurse Midwife

## 2015-03-17 ENCOUNTER — Encounter: Payer: Self-pay | Admitting: Certified Nurse Midwife

## 2015-03-17 VITALS — BP 112/78 | HR 70 | Resp 16 | Ht 61.0 in | Wt 136.0 lb

## 2015-03-17 DIAGNOSIS — Z124 Encounter for screening for malignant neoplasm of cervix: Secondary | ICD-10-CM | POA: Diagnosis not present

## 2015-03-17 DIAGNOSIS — N950 Postmenopausal bleeding: Secondary | ICD-10-CM | POA: Diagnosis not present

## 2015-03-17 NOTE — Progress Notes (Signed)
64 y.o.Olivia Cruz female complaining of 2 episodes of vaginal bleeding. Noted last one after sexual activity, just brown color 2 days ago. She describes it as spotting and no pain when occurs..  She is not  on  HRT .  She has not had previous episodes of menopausal bleeding. She has been menopausal for  9 years.. Patient has not noted any vaginal dryness, but spouse is on medication to help with ED. Patient recently treated for sinus infection and still has some fatigue with this. Started on Vesicare per PCP for urinary stress incontinence at times. Not happy with medication, although it has helped. Patient would like to have Ileana Roup information. No other health issues.    reports that she has quit smoking. She does not have any smokeless tobacco history on file. She reports that she does not drink alcohol or use illicit drugs.  There are no active problems to display for this patient.   Past Medical History  Diagnosis Date  . STD (sexually transmitted disease)     HSV II  . Insomnia   . Osteoarthritis   . Anxiety   . Depression   . Migraines   . Hypothyroid     Past Surgical History  Procedure Laterality Date  . Breast surgery Left     times 2  . Ganglion cyst excision Left     foot  . Hemorrhoid surgery      Current Outpatient Prescriptions  Medication Sig Dispense Refill  . Calcium Carbonate-Vitamin D (CALCIUM + D PO) Take 1,200 mg by mouth daily.    Marland Kitchen estradiol (ESTRACE) 0.1 MG/GM vaginal cream Place 0.25 Applicatorfuls vaginally 2 (two) times a week. Use 1 gram Twice a week 42.5 g 4  . levothyroxine (SYNTHROID, LEVOTHROID) 50 MCG tablet TK 1 T PO D  1  . Multiple Vitamin (MULTIVITAMIN) capsule Take 1 capsule by mouth daily.    . VESICARE 10 MG tablet TK 1 T PO D  5  . nitrofurantoin, macrocrystal-monohydrate, (MACROBID) 100 MG capsule Take 1 capsule (100 mg total) by mouth as needed. One  post coital as directed (Patient not taking: Reported on 03/17/2015) 30 capsule  2  . valACYclovir (VALTREX) 500 MG tablet Take 1 tablet (500 mg total) by mouth 2 (two) times daily. Take bid x 3 days at onset (Patient not taking: Reported on 03/17/2015) 60 tablet 6   No current facility-administered medications for this visit.    [id number] items are noted in HPI.  Exam:    BP 112/78 mmHg  Pulse 70  Resp 16  Ht 5\' 1"  (1.549 m)  Wt 136 lb (61.689 kg)  BMI 25.71 kg/m2  LMP 08/22/2005  General appearance: alert, cooperative and appears stated age Abdomen: non tender,no masses no inguinal nodes palpated  Pelvic: External genitalia:  no lesions and normal female              Bartholins and Skenes: Bartholin's, Urethra, Skene's normal                 Vagina: normal appearing vagina with normal color and blood noted in vagina,no lesions              Cervix: normal appearance and red blood noted from cervix and in posterior fornix of vagina, pap smear taken                      Bimanual Exam:  Uterus:  uterus is normal size, shape, consistency and nontender  Adnexa:    normal adnexa in size, nontender and no masses                                      Rectovaginal: Confirms                                      Anus:  defer exam  A:  Post menopausal bleeding Normal pelvic exam Stress incontinence requests PT information for evaluation  P: Discussed need for evaluation with endometrial biopsy and PUS. Questions addressed regarding evaluation.Patient agreeable. Patient aware she will be called with insurance info and scheduled. Warnings of vaginal bleeding given. Lab: Pap taken with HPV reflex Given Ileana Roup information at [facility] urology.  Rv as above           .

## 2015-03-17 NOTE — Patient Instructions (Signed)
Postmenopausal Bleeding  Postmenopausal bleeding is any bleeding a woman has after she has entered into menopause. Menopause is the end of a woman's fertile years. After menopause, a woman no longer ovulates or has menstrual periods.   Postmenopausal bleeding can be caused by various things. Any type of postmenopausal bleeding, even if it appears to be a typical menstrual period, is concerning. This should be evaluated by your health care provider. Any treatment will depend on the cause of the bleeding.  HOME CARE INSTRUCTIONS  Monitor your condition for any changes. The following actions may help to alleviate any discomfort you are experiencing:  · Avoid the use of tampons and douches as directed by your health care provider.   · Change your pads frequently.  · Get regular pelvic exams and Pap tests.  · Keep all follow-up appointments for diagnostic tests as directed by your health care provider.  SEEK MEDICAL CARE IF:   · Your bleeding lasts more than 1 week.  · You have abdominal pain.  · You have bleeding with sexual intercourse.  SEEK IMMEDIATE MEDICAL CARE IF:   · You have a fever, chills, headache, dizziness, muscle aches, and bleeding.  · You have severe pain with bleeding.  · You are passing blood clots.  · You have bleeding and need more than 1 pad an hour.  · You feel faint.  MAKE SURE YOU:  · Understand these instructions.  · Will watch your condition.  · Will get help right away if you are not doing well or get worse.  Document Released: 11/16/2005 Document Revised: 05/29/2013 Document Reviewed: 03/07/2013  ExitCare® Patient Information ©2015 [employer]. This information is not intended to replace advice given to you by your health care provider. Make sure you discuss any questions you have with your health care provider.

## 2015-03-18 ENCOUNTER — Telehealth: Payer: Self-pay | Admitting: Certified Nurse Midwife

## 2015-03-18 DIAGNOSIS — N950 Postmenopausal bleeding: Secondary | ICD-10-CM

## 2015-03-18 NOTE — Telephone Encounter (Signed)
Patient wanted to give alternate number for nurse when someone calls her to schedule tests. If calling in the morning please call her work number at [phone] and cell is ok after 12:00.

## 2015-03-18 NOTE — Telephone Encounter (Addendum)
Call to patient. She would like to schedule procedures ordered yesterday by Regina Eck CNM for evaluation of post menopausal bleeding. She has a grand baby coming in September and would like to plan early.   Scheduled Pelvic ultrasound  And Endometrial biopsy with Dr. Sabra Heck for tomorrow at 1430. Patient agreeable. She is advised she will receive return call from insurance department regarding coverage of procedure and patient agreeable.   Routing to provider for final review. Patient agreeable to disposition. Will close encounter.    cc Kerry Hough

## 2015-03-19 ENCOUNTER — Encounter: Payer: Self-pay | Admitting: Obstetrics and Gynecology

## 2015-03-19 ENCOUNTER — Ambulatory Visit (INDEPENDENT_AMBULATORY_CARE_PROVIDER_SITE_OTHER): Payer: PPO

## 2015-03-19 ENCOUNTER — Other Ambulatory Visit: Payer: PPO

## 2015-03-19 ENCOUNTER — Other Ambulatory Visit: Payer: Self-pay

## 2015-03-19 ENCOUNTER — Other Ambulatory Visit: Payer: PPO | Admitting: Obstetrics & Gynecology

## 2015-03-19 ENCOUNTER — Ambulatory Visit (INDEPENDENT_AMBULATORY_CARE_PROVIDER_SITE_OTHER): Payer: PPO | Admitting: Obstetrics and Gynecology

## 2015-03-19 VITALS — BP 100/60 | HR 80 | Resp 16 | Wt 136.0 lb

## 2015-03-19 DIAGNOSIS — N950 Postmenopausal bleeding: Secondary | ICD-10-CM

## 2015-03-19 MED ORDER — LORAZEPAM 1 MG PO TABS: ORAL_TABLET | ORAL | Status: DC

## 2015-03-19 MED ORDER — MISOPROSTOL 200 MCG PO TABS: ORAL_TABLET | ORAL | Status: DC

## 2015-03-19 NOTE — Progress Notes (Signed)
Reviewed personally.  M. Suzanne Cleatis Fandrich, MD.  

## 2015-03-19 NOTE — Patient Instructions (Signed)
Place the 2 tablets of misoprostol (cytotec) in your vagina 6-12 hours prior to your appointment, it may make you crampy or spot  Take ativan and ibuprofen 1 hour prior to your appointment  Eat before your appointment

## 2015-03-19 NOTE — Progress Notes (Signed)
      The patient reports 2 episodes of vaginal bleeding in the last several years. She recently had a 4 day episode of dark brown spotting. She had intercourse around the time it started, thinks she started bleeding before she had sex. She uses estrace, olive oil for lubrication. She had some entry pain with intercourse. Prior episode of bleeding was random, bright red blood.   O: normal external genitalia, normal vaginal mucosa, stenotic cervical os.  Attempted endometrial biopsy.  The risks of endometrial biopsy were reviewed and a consent was obtained.  A speculum was placed in the vagina and the cervix was cleansed with betadine. A tenaculum was placed on the cervix and the mini-pipelle was could not be placed into the endometrial cavity. Able to pass the smallest mini-dilator into the uterine cavity. Patient very tender with attempt to dilate with the next dilator. Paracervical block placed with 1% lidocaine, total of 10 cc placed at 2,4,8,10 o'clock. Still too uncomfortable to tolerate further dilation. The tenaculum and speculum were removed. There were no complications.     A/P Post menopausal bleeding, this is her second episode of bleeding in the last few years.  Thin stripe on ultrasound, but + fluid. Stenotic os, patient unable to tolerate dilation Reviewed options of pretreating with cytotec, ativan and ibuprofen and trying again, vs hysteroscopy, D&C in the OR She would like to try again in the office Will pre-treat with cytotec, ativan and ibuprofen  Cc: Melvia Heaps, CNM, Edwinna Areola, MD  Spent 15 minutes face to face in counseling with the patient (husband present for part of it)

## 2015-03-20 ENCOUNTER — Telehealth: Payer: Self-pay | Admitting: Obstetrics and Gynecology

## 2015-03-20 LAB — IPS PAP TEST WITH REFLEX TO HPV

## 2015-03-20 NOTE — Telephone Encounter (Signed)
Spoke with patient. Patient states that she has decided she does not want to try to have an EMB here is the office. Instead would like to proceed with hysteroscopy D&C in OR depending on the cost. Advised I will have to send a message to [place] and billing department for this to be checked for her. She will receive a phone call to discuss costs and we will go forward with getting scheduled if she would like. Patient is agreeable.  Cc: Ivar Drape

## 2015-03-20 NOTE — Telephone Encounter (Signed)
Left message to call Kaitlyn at [phone]. 

## 2015-03-20 NOTE — Telephone Encounter (Signed)
Patient calling to speak with nurse regarding having a procedure done at hospital versus having an endometrial biopsy done in office.

## 2015-03-20 NOTE — Telephone Encounter (Signed)
Is this in your wq yet?

## 2015-03-23 NOTE — Telephone Encounter (Signed)
The only procedure received for pre-cert was PUS w/endometrial biopsy. Completed and scheduled on 03/19/15. Appointment history shows later cancelled. Due to surgical nature, forwarding to [facility].

## 2015-03-23 NOTE — Telephone Encounter (Signed)
Spoke with patient this afternoon and explained benefits and payment requirements. Patient understood and agreeable. Patient will call tomorrow morning with payment to move forward with surgery scheduling. Surgery sheet held on referrals desk until patient calls.

## 2015-03-23 NOTE — Telephone Encounter (Signed)
Patient checking if insurance has been called regarding having procedure done at hospital.

## 2015-03-23 NOTE — Telephone Encounter (Signed)
Call to patient. Brief discussion on surgery date options. Patient prefers to proceed with first available date. Will schedule and call patient back. Patient is aware that precert for surgery benefits is in process.  Hysteroscopy/D&C at [facility]/Outpatient/Dr Miler.

## 2015-03-24 NOTE — Telephone Encounter (Signed)
Return call to Sally. °

## 2015-03-24 NOTE — Telephone Encounter (Signed)
Call to patient to review surgery date and instructions. Left message to call back.

## 2015-03-25 NOTE — Telephone Encounter (Signed)
Call to patient. Confirmed surgery scheduled for Monday 04-06-15 at 1045 at [facility]. Surgery instruction sheet reviewed and printed copy mailed to patient. See scanned copy. Consult with Dr Sabra Heck on 03-27-15.  Routing to provider for final review. Patient agreeable to disposition. Will close encounter.

## 2015-03-26 ENCOUNTER — Other Ambulatory Visit: Payer: Self-pay | Admitting: Obstetrics & Gynecology

## 2015-03-27 ENCOUNTER — Other Ambulatory Visit: Payer: Self-pay | Admitting: Obstetrics & Gynecology

## 2015-03-27 ENCOUNTER — Ambulatory Visit: Payer: PPO | Admitting: Obstetrics & Gynecology

## 2015-03-27 ENCOUNTER — Telehealth: Payer: Self-pay | Admitting: Emergency Medicine

## 2015-03-27 MED ORDER — DIAZEPAM 5 MG PO TABS: 5.0000 mg | ORAL_TABLET | Freq: Four times a day (QID) | ORAL | Status: DC | PRN

## 2015-03-27 MED ORDER — MISOPROSTOL 100 MCG PO TABS: ORAL_TABLET | ORAL | Status: DC

## 2015-03-27 NOTE — Telephone Encounter (Signed)
Patient on the line with Becky with insurance. Dr. Sabra Heck advised that patient can have option to have Endometrial biopsy in office with pretreatment medications. Patient was on the line with her insurance company who advised her that cost for procedure is $170.00.   Patient states "I know I can't do that in office again, I just simply can't take that." Advised per Dr. Sabra Heck we need a diagnostic biopsy.  Patient aware of this evaluation required. She verbalizes she would like to continue with plan for outpatient procedure and the line is passed back to [place] at this time.

## 2015-03-30 ENCOUNTER — Telehealth: Payer: Self-pay | Admitting: Obstetrics & Gynecology

## 2015-03-30 NOTE — Telephone Encounter (Signed)
Spoke with patient. Patient states that she went to pick up her rx for Cytotec for her EMB and there was one rx for 100 mcg and one for 200 mcg. Patient is unsure which to have filled. Rx sent by Dr.Miller is Cytotec 100 mcg one tablet vaginally one night before the procedure and one vaginal the morning of the procedure. Advised will check with Dr.Miller regarding dosage and return call. Patient is agreeable.

## 2015-03-30 NOTE — Telephone Encounter (Signed)
Patient says a prescription was sent by Dr.Jertson then [place] for the same prescription with different strengths. Patient believes the prescription Dr.Miller sent is the correct strength. Please call.

## 2015-04-02 NOTE — Telephone Encounter (Signed)
Patient called to confirm her endometrial biopsy 04/06/15. I told patient her appointment is 04/09/15. Patient said "Oh dear I already have Monday 04/06/15 off from work, but I think Thursday may be okay. I told patient I would have a nurse call her.

## 2015-04-02 NOTE — Telephone Encounter (Signed)
Call to patient. She reviewed her calendar and realized her dates were incorrect. She confirms appointment for 04/09/15 at 1230 arrive at 1215.  Reviewed cytotec instructions. One tab vaginally the night before and morning of the procedure.  Patient confirms she understands pre-procedure instructions. She will keep appointment as scheduled.  Routing to provider for final review. Patient agreeable to disposition. Will close encounter.

## 2015-04-05 MED ORDER — METRONIDAZOLE IN NACL 5-0.79 MG/ML-% IV SOLN
500.0000 mg | Freq: Once | INTRAVENOUS | Status: DC
  Filled 2015-04-05: qty 100

## 2015-04-06 ENCOUNTER — Encounter (HOSPITAL_COMMUNITY): Admission: RE | Payer: Self-pay | Source: Ambulatory Visit

## 2015-04-06 ENCOUNTER — Ambulatory Visit (HOSPITAL_COMMUNITY): Admission: RE | Admit: 2015-04-06 | Payer: PPO | Source: Ambulatory Visit | Admitting: Obstetrics & Gynecology

## 2015-04-06 SURGERY — DILATATION AND CURETTAGE /HYSTEROSCOPY
Anesthesia: Choice

## 2015-04-08 ENCOUNTER — Telehealth: Payer: Self-pay | Admitting: Obstetrics & Gynecology

## 2015-04-08 NOTE — Telephone Encounter (Signed)
Thank you for the update.  Encounter closed. 

## 2015-04-08 NOTE — Telephone Encounter (Signed)
Spoke with patient. Advised of message as seen below from [place]. Patient is agreeable and verbalizes understanding. States she was mistaken and the rx for Valium is 5 mg but received 10 tablets. Has nine tablets left. Patient will arrive tomorrow at 11:30am to sign consent forms and receive recommendations from [place] regarding dosage of Valium. Will take 800 mg of Motrin at 11:30 am as well. Patient will have her husband bring and take her home. Advised if Dr.Miller has any further recommendations prior to her appointment tomorrow I will call to let her know. Patient is agreeable.  Cc: Dr.Miller

## 2015-04-08 NOTE — Telephone Encounter (Signed)
Left message to call Kaitlyn at [phone]. 

## 2015-04-08 NOTE — Telephone Encounter (Signed)
Patient calling with questions for the nurse regarding medication she is taking for her procedure tomorrow.

## 2015-04-08 NOTE — Telephone Encounter (Signed)
Spoke with patient. Patient states that Dr.Miller prescribed her Valium to take before EMB scheduled for tomorrow. Patient states that she took Valium 10 mg the other night to see if it would help reduce her anxiety and states she felt no different. Is asking if she can increase the dosage to help reduce anxiety about the procedure tomorrow. Advised per note in EPIC MAR Valium was given as 5 mg tablets. Patient states she was given Valium 10 mg. Patient is very anxious about the procedure and states "I don't want to be so anxious that I get up there and don't have it done." Advised will need to speak with provider regarding premedication for appointment and return call. Patient is agreeable.  Routing to Dr.Silva for review as Dr.Miller is out of the office today.  Cc:Dr.Miller

## 2015-04-08 NOTE — Telephone Encounter (Signed)
Patient returning call.

## 2015-04-08 NOTE — Telephone Encounter (Signed)
My recommendation is for the patient to bring her valium Rx with her tomorrow to her visit and come at least one hour prior to her appointment time.  She will need to sign any consent forms prior to taking the medication, dosage to be defined by Dr. Sabra Heck in the office tomorrow, and she will need a driver to take her home. Take Motrin 800 mg po one hour prior to procedure if patient is able to take NSAIDs. I hope this is helpful for the patient.   Cc- Dr. Sabra Heck

## 2015-04-09 ENCOUNTER — Ambulatory Visit (INDEPENDENT_AMBULATORY_CARE_PROVIDER_SITE_OTHER): Payer: PPO | Admitting: Obstetrics & Gynecology

## 2015-04-09 VITALS — BP 102/62 | HR 72 | Resp 14 | Wt 136.2 lb

## 2015-04-09 DIAGNOSIS — N882 Stricture and stenosis of cervix uteri: Secondary | ICD-10-CM | POA: Diagnosis not present

## 2015-04-09 DIAGNOSIS — N950 Postmenopausal bleeding: Secondary | ICD-10-CM | POA: Diagnosis not present

## 2015-04-09 DIAGNOSIS — N952 Postmenopausal atrophic vaginitis: Secondary | ICD-10-CM | POA: Diagnosis not present

## 2015-04-09 HISTORY — DX: Stricture and stenosis of cervix uteri: N88.2

## 2015-04-09 HISTORY — DX: Postmenopausal atrophic vaginitis: N95.2

## 2015-04-09 MED ORDER — ESTRADIOL 0.1 MG/GM VA CREA: TOPICAL_CREAM | VAGINAL | Status: DC

## 2015-04-09 MED ORDER — NITROFURANTOIN MONOHYD MACRO 100 MG PO CAPS: 100.0000 mg | ORAL_CAPSULE | ORAL | Status: DC | PRN

## 2015-04-09 NOTE — Progress Notes (Signed)
64 y.o. G4P2 Married Caucasian female with hx of brownish, blood-like discharge in late July.  Was seen 03/19/15 by Dr. Talbert Nan who saw pt after PUS was performed showing uteru measuring 5.8 x 4.5 x 2.5cm with 15mm endometrium and sliver of endometrial fluid.  Attempt made at endometrial biopsy but was unsuccessful due to pt's inability to tolerate procedure.  Pt was scheduled for hysteroscopy with poss D&C but was uncomfortable with type of anesthesia she would need.  After discussing with pt, decided to try again.  Pretreated with Cytotec and Valium.  Pt does have a couple of unrelated questions.  Needs RFs for macrobid and estrace cream.  Pt did have MMG 03/17/14 at [facility].  RFs will be done.  Pt does have dense breasts and [facility] does not have 3D equipment.  D/w pt considering doing in [place] for next MMG.  Locations for 3D MMG discussed.  Pt will consider.      reports that she has quit smoking. She has never used smokeless tobacco. She reports that she does not drink alcohol or use illicit drugs.  There are no active problems to display for this patient.  ROS:A comprehensive review of systems was negative.  Exam:    BP 102/62 mmHg  Pulse 72  Resp 14  Wt 136 lb 3.2 oz (61.78 kg)  LMP 08/22/2005  General appearance: alert and cooperative Abdomen: soft, non-tender; bowel sounds normal; no masses,  no organomegaly Lynph:  No inguinal LAD noted  Pelvic: External genitalia:  no lesions              Bartholins and Skenes: Bartholin's, Urethra, Skene's normal                 Vagina: normal appearing vagina with normal color and discharge, no lesions              Cervix: almost flush with vagina except for small anterior lip                      Bimanual Exam:  Uterus:  uterus is normal size, shape, consistency and nontender                                      Adnexa:    no masses                                   Endometrial biopsy recommended.  Discussed with patient.  Verbal and  written consent obtained.   Procedure:  Speculum placed.  Cytotec removed from vagina (what was not dissolved).  Cervix visualized and cleansed with betadine prep.  A single toothed tenaculum was applied to the anterior lip of the cervix.  Dilation with milex and pratt dilators required.  Endometrial pipelle was advanced through the cervix into the endometrial cavity without difficulty.  Pipelle passed to 6.5cm.  Suction applied and pipelle removed with good tissue sample obtained.  Tenculum removed.  No bleeding noted.  Patient tolerated procedure well.  She did have cramping with the procedure but this resolved very quickly once procedure was ended.   A:  Post menopausal bleeding Stenotic cervical os Atrophic vaginal tissue Grade 3 breast density  P:  Endometrial biopsy completed Estrace vaginal cream rx to pharmacy.  1gm pv twice weekly. Macrobid 100mg  before intercourse.  #  30/3RF D/W pt 3D MMGs.  She will consider for next year.

## 2015-04-15 ENCOUNTER — Telehealth: Payer: Self-pay | Admitting: Obstetrics & Gynecology

## 2015-04-15 NOTE — Telephone Encounter (Signed)
Attempted to reach Olivia Cruz at [facility] [phone]. There was no answer and voicemail box does not verify name. Did not leave voicemail as I am unsure if the voicemail box is secure. Will try again later.

## 2015-04-15 NOTE — Telephone Encounter (Signed)
Patient's pharmacy calling requesting clarification on a diagnosis code related to a prior authorization for nitrofurantin.

## 2015-04-16 NOTE — Telephone Encounter (Signed)
Patient returned call. Will be in and out of the house this afternoon for a return call.

## 2015-04-16 NOTE — Telephone Encounter (Signed)
Lmtcb//kn 

## 2015-04-16 NOTE — Telephone Encounter (Signed)
-----   Message from Megan Salon, MD sent at 04/14/2015 12:29 PM EDT ----- Does she have a formulary book or way to access this on line?  If so, she should look to see if Premarin cream is on the list.  Can switch to this and dosage is very similar.

## 2015-04-17 MED ORDER — TRIMETHOPRIM 100 MG PO TABS: ORAL_TABLET | ORAL | Status: DC

## 2015-04-17 NOTE — Telephone Encounter (Signed)
Patient is returning a call to Kelly °

## 2015-04-17 NOTE — Addendum Note (Signed)
Addended by: Megan Salon on: 04/17/2015 05:00 PM   Modules accepted: Orders

## 2015-04-20 NOTE — Telephone Encounter (Signed)
Spoke with patient-see results note.//kn

## 2015-04-21 ENCOUNTER — Ambulatory Visit: Payer: PPO | Admitting: Obstetrics & Gynecology

## 2015-04-24 ENCOUNTER — Telehealth: Payer: Self-pay | Admitting: Obstetrics & Gynecology

## 2015-04-24 MED ORDER — ESTROGENS, CONJUGATED 0.625 MG/GM VA CREA: 1.0000 | TOPICAL_CREAM | Freq: Every day | VAGINAL | Status: DC

## 2015-04-24 NOTE — Telephone Encounter (Signed)
Spoke with patient. Advised of message as seen below from [place]. Patient is agreeable and would like to try Premarin cream at this time. Advised I will let Dr.Miller know so rx can be sent. Patient is agreeable. Premarin order pending. Dr.Miller please verify instructions for use.  Notes Recorded by Robley Fries, CMA on 04/21/2015 at 5:21 PM Left message on cell#, ok per DPR, with this information and that the Premarin does come in a 30 gram tube, so this will last awhile. Also, Dr Sabra Heck has sent in a RX for Trimethoprim and we are just waiting to hear if it is approved.//kn Notes Recorded by Megan Salon, MD on 04/20/2015 at 5:33 PM That is an anti-fungal and not a substitution for Premarin or estrace. I'm sorry. Notes Recorded by Robley Fries, CMA on 04/20/2015 at 4:55 PM Patient called back with coverage information for Premarin-it is a Tier III, 45.00 monthly, which is still a little too much for her. She was told that there is a generic for Premarin cream that is Terconazole 0.4% and it is only 4.00. Aware I do not think this is correct, but I will check with Dr Sabra Heck. Please advise.//kn

## 2015-04-24 NOTE — Telephone Encounter (Signed)
Order signed. Thanks.

## 2015-04-24 NOTE — Telephone Encounter (Signed)
Patient says she is returning a call to Kelly. No open tc note? °

## 2015-04-29 ENCOUNTER — Telehealth: Payer: Self-pay | Admitting: Obstetrics & Gynecology

## 2015-04-29 MED ORDER — ESTROGENS, CONJUGATED 0.625 MG/GM VA CREA: TOPICAL_CREAM | VAGINAL | Status: DC

## 2015-04-29 NOTE — Telephone Encounter (Signed)
Patient is asking to talk with a nurse regarding the prescription she picked up yesterday.

## 2015-04-29 NOTE — Telephone Encounter (Signed)
Spoke with patient. Advised of message as seen below from [place]. Patient is agreeable and verbalizes understanding of instructions. New rx for Premarin vaginal cream 1 gram per vagina at bedtime twice per week 30 g 3RF sent to pharmacy with note not to fill until notified by patient.   Routing to provider for final review. Patient agreeable to disposition. Will close encounter.

## 2015-04-29 NOTE — Telephone Encounter (Signed)
Left message to call Roddie Riegler at [phone]. 

## 2015-04-29 NOTE — Telephone Encounter (Signed)
I am sorry for the confusion for the patient.  Please correct the prescription with her and the pharmacy.  It should be Premarin vaginal cream, 1 gram, per vagina, at bedtime, twice per week.  Refill number as authorized by Dr. Sabra Heck. Please cancel any orders for the Estrace cream so that there is not confusion that we are prescribing both medications.   Thank you!

## 2015-04-29 NOTE — Telephone Encounter (Signed)
Spoke with patient. Patient states that she picked up Premarin yesterday and is unsure about how often to use the rx. "The directions say to use one applicator full daily. That will not last me very long." Advised most often rx is used once to twice per week. Advised I will need to speak with covering MD to clarify directions for use and return call. Patient is agreeable.

## 2015-06-22 ENCOUNTER — Telehealth: Payer: Self-pay | Admitting: Emergency Medicine

## 2015-06-22 NOTE — Telephone Encounter (Signed)
Message left to return call to [place] at [phone].   Calling patient with response from [facility]. Coverage for Macrobid through 08/22/15.

## 2015-09-03 DIAGNOSIS — N3942 Incontinence without sensory awareness: Secondary | ICD-10-CM | POA: Diagnosis not present

## 2015-09-03 DIAGNOSIS — Z0000 Encounter for general adult medical examination without abnormal findings: Secondary | ICD-10-CM | POA: Diagnosis not present

## 2015-09-03 DIAGNOSIS — N3946 Mixed incontinence: Secondary | ICD-10-CM | POA: Diagnosis not present

## 2015-09-03 DIAGNOSIS — N3020 Other chronic cystitis without hematuria: Secondary | ICD-10-CM | POA: Diagnosis not present

## 2015-09-12 DIAGNOSIS — R0602 Shortness of breath: Secondary | ICD-10-CM | POA: Diagnosis not present

## 2015-09-12 DIAGNOSIS — R05 Cough: Secondary | ICD-10-CM | POA: Diagnosis not present

## 2015-09-12 DIAGNOSIS — H60539 Acute contact otitis externa, unspecified ear: Secondary | ICD-10-CM | POA: Diagnosis not present

## 2015-09-17 DIAGNOSIS — J208 Acute bronchitis due to other specified organisms: Secondary | ICD-10-CM | POA: Diagnosis not present

## 2015-09-17 DIAGNOSIS — J410 Simple chronic bronchitis: Secondary | ICD-10-CM | POA: Diagnosis not present

## 2015-09-25 DIAGNOSIS — Z780 Asymptomatic menopausal state: Secondary | ICD-10-CM | POA: Diagnosis not present

## 2015-09-25 DIAGNOSIS — M8589 Other specified disorders of bone density and structure, multiple sites: Secondary | ICD-10-CM | POA: Diagnosis not present

## 2015-09-29 DIAGNOSIS — R0789 Other chest pain: Secondary | ICD-10-CM | POA: Insufficient documentation

## 2015-09-29 DIAGNOSIS — R0602 Shortness of breath: Secondary | ICD-10-CM | POA: Diagnosis not present

## 2015-09-29 DIAGNOSIS — I10 Essential (primary) hypertension: Secondary | ICD-10-CM | POA: Diagnosis not present

## 2015-09-29 DIAGNOSIS — R42 Dizziness and giddiness: Secondary | ICD-10-CM | POA: Diagnosis not present

## 2015-09-29 DIAGNOSIS — R0609 Other forms of dyspnea: Secondary | ICD-10-CM | POA: Diagnosis not present

## 2015-10-05 DIAGNOSIS — R42 Dizziness and giddiness: Secondary | ICD-10-CM | POA: Diagnosis not present

## 2015-10-05 DIAGNOSIS — R0789 Other chest pain: Secondary | ICD-10-CM | POA: Diagnosis not present

## 2015-10-05 DIAGNOSIS — Z87891 Personal history of nicotine dependence: Secondary | ICD-10-CM | POA: Diagnosis not present

## 2015-10-05 DIAGNOSIS — R0602 Shortness of breath: Secondary | ICD-10-CM | POA: Diagnosis not present

## 2015-10-05 DIAGNOSIS — I10 Essential (primary) hypertension: Secondary | ICD-10-CM | POA: Diagnosis not present

## 2015-10-05 DIAGNOSIS — Z79899 Other long term (current) drug therapy: Secondary | ICD-10-CM | POA: Diagnosis not present

## 2015-10-05 HISTORY — PX: CARDIAC CATHETERIZATION: SHX172

## 2015-10-08 DIAGNOSIS — R918 Other nonspecific abnormal finding of lung field: Secondary | ICD-10-CM | POA: Diagnosis not present

## 2015-10-08 DIAGNOSIS — R0602 Shortness of breath: Secondary | ICD-10-CM | POA: Diagnosis not present

## 2015-10-15 DIAGNOSIS — J45991 Cough variant asthma: Secondary | ICD-10-CM | POA: Diagnosis not present

## 2015-10-15 DIAGNOSIS — R05 Cough: Secondary | ICD-10-CM | POA: Diagnosis not present

## 2015-10-15 DIAGNOSIS — K219 Gastro-esophageal reflux disease without esophagitis: Secondary | ICD-10-CM | POA: Diagnosis not present

## 2015-10-20 DIAGNOSIS — R0789 Other chest pain: Secondary | ICD-10-CM | POA: Diagnosis not present

## 2015-11-02 DIAGNOSIS — J342 Deviated nasal septum: Secondary | ICD-10-CM | POA: Diagnosis not present

## 2015-11-02 DIAGNOSIS — R05 Cough: Secondary | ICD-10-CM | POA: Diagnosis not present

## 2015-11-02 DIAGNOSIS — R490 Dysphonia: Secondary | ICD-10-CM | POA: Diagnosis not present

## 2015-11-06 ENCOUNTER — Encounter: Payer: Self-pay | Admitting: Allergy and Immunology

## 2015-11-06 ENCOUNTER — Ambulatory Visit (INDEPENDENT_AMBULATORY_CARE_PROVIDER_SITE_OTHER): Payer: PPO | Admitting: Allergy and Immunology

## 2015-11-06 VITALS — BP 138/72 | HR 64 | Temp 98.0°F | Resp 18 | Ht 60.04 in | Wt 144.2 lb

## 2015-11-06 DIAGNOSIS — J387 Other diseases of larynx: Secondary | ICD-10-CM | POA: Diagnosis not present

## 2015-11-06 DIAGNOSIS — J4541 Moderate persistent asthma with (acute) exacerbation: Secondary | ICD-10-CM

## 2015-11-06 DIAGNOSIS — H1010 Acute atopic conjunctivitis, unspecified eye: Secondary | ICD-10-CM

## 2015-11-06 DIAGNOSIS — J4540 Moderate persistent asthma, uncomplicated: Secondary | ICD-10-CM | POA: Diagnosis not present

## 2015-11-06 DIAGNOSIS — J309 Allergic rhinitis, unspecified: Secondary | ICD-10-CM | POA: Diagnosis not present

## 2015-11-06 DIAGNOSIS — K219 Gastro-esophageal reflux disease without esophagitis: Secondary | ICD-10-CM

## 2015-11-06 MED ORDER — ALBUTEROL SULFATE HFA 108 (90 BASE) MCG/ACT IN AERS: INHALATION_SPRAY | RESPIRATORY_TRACT | Status: DC

## 2015-11-06 MED ORDER — RANITIDINE HCL 300 MG PO TABS: ORAL_TABLET | ORAL | Status: DC

## 2015-11-06 MED ORDER — OMEPRAZOLE 40 MG PO CPDR: DELAYED_RELEASE_CAPSULE | ORAL | Status: DC

## 2015-11-06 MED ORDER — BUDESONIDE-FORMOTEROL FUMARATE 160-4.5 MCG/ACT IN AERO: INHALATION_SPRAY | RESPIRATORY_TRACT | Status: DC

## 2015-11-06 NOTE — Progress Notes (Signed)
Dear Dr.Cox,  Thank you for referring Rich Number Rosenbach to the [facility] on 11/06/2015.   Below is a summation of this patient's evaluation and recommendations.  Thank you for your referral. I will keep you informed about this patient's response to treatment.   If you have any questions please to do hestitate to contact me.   Sincerely,  Jiles Prows, MD [place]   ______________________________________________________________________    NEW PATIENT NOTE  Referring Provider: Rochel Brome, MD Primary Provider: Rochel Brome, MD Date of office visit: 11/06/2015    Subjective:   Chief Complaint:  Olivia Cruz (DOB: 28-Feb-1951) is a 65 y.o. female with a chief complaint of Shortness of Breath; Hoarse; and Cough  who presents to the clinic on 11/06/2015 with the following problems:  HPI Comments: Perley presents this clinic in evaluation of multiple respiratory tract problems. She has a long history of intermittent asthma that would only present itself under specific circumstances usually when being exposed to some type of irritant exposure. She's had a dry hacking cough for the past 18 months and has had several bouts of "bronchitis" with one bout in October and another bout when visiting her daughter in December and another bout somewhere at the end of January or February. Her "bronchitis" is manifested as unrelenting coughing that's occasionally associated with clear sputum production and chest pain. Her chest pain was so significant at one time that she underwent a cardiac catheterization the spring which apparently was normal. She has had 3 chest x-rays all of which have been relatively normal. She was given Symbicort for the past month which may have helped her cough somewhat.  Her major complaints revolve around the fact that she does have this hacking dry cough but she also is  very hoarse and raspy. She has throat clearing and she has a tickle in her throat. She did see Dr. Gaylyn Cheers in evaluation of this issue who performed rhinoscopy and told her that there was some evidence reflux was playing a role and she was placed on omeprazole twice a day for the past month. She's not sure that this actually helped her very much but interestingly when she stopped this agent this week she developed heartburn. She's also been having a lot of hiccups for the past 3 months.  She also has had an issue with intermittent strangling when eating to the point where she has an explosive cough and expels her food. This happens about 1 time per month but she's had to these episodes in the past 2 weeks. She does not have any other neurological issues specifically, she does not have any weakness and she does not have any swallowing problems. Food does not get hung up in her chest. This appears to be more of a coordination issue because she does not really realize it is happening until she is in one of her strangling coughing episodes.  Kashlyn drinks on average about 4 cups of coffee a day.     Past Medical History  Diagnosis Date  . STD (sexually transmitted disease)     HSV II  . Insomnia   . Osteoarthritis   . Anxiety   . Depression   . Migraines   . Hypothyroid     Past Surgical History  Procedure Laterality Date  . Breast surgery Left     times 2  . Ganglion cyst excision Left  foot  . Hemorrhoid surgery     Current outpatient prescriptions:  .  budesonide-formoterol (SYMBICORT) 160-4.5 MCG/ACT inhaler, Inhale 2 puffs into the lungs 2 (two) times daily., Disp: , Rfl:  .  Cetirizine HCl (ZYRTEC ALLERGY PO), Take 10 mg by mouth daily., Disp: , Rfl:  .  citalopram (CELEXA) 20 MG tablet, Take 20 mg by mouth daily., Disp: , Rfl:  .  omeprazole (PRILOSEC) 20 MG capsule, Take 20 mg by mouth 2 (two) times daily., Disp: , Rfl:  .  Calcium Carbonate-Vitamin D (CALCIUM + D PO), Take 1,200  mg by mouth daily., Disp: , Rfl:  .  levothyroxine (SYNTHROID, LEVOTHROID) 50 MCG tablet, TK 1 T PO D, Disp: , Rfl: 1 .  Multiple Vitamin (MULTIVITAMIN) capsule, Take 1 capsule by mouth daily., Disp: , Rfl:  .  nitrofurantoin, macrocrystal-monohydrate, (MACROBID) 100 MG capsule, Take 1 capsule (100 mg total) by mouth as needed. One  post coital as directed, Disp: 30 capsule, Rfl: 2 .  valACYclovir (VALTREX) 500 MG tablet, Take 1 tablet (500 mg total) by mouth 2 (two) times daily. Take bid x 3 days at onset, Disp: 60 tablet, Rfl: 6 .  VESICARE 10 MG tablet, TK 1 T PO D, Disp: , Rfl: 5    Allergies  Allergen Reactions  . Augmentin [Amoxicillin-Pot Clavulanate] Nausea And Vomiting  . Codeine Nausea Only  . Oxycodone   . Prednisone   . Sulfa Antibiotics     Review of systems negative except as noted in HPI / PMHx or noted below:  Review of Systems  Constitutional: Negative.   HENT: Negative.   Eyes: Negative.   Respiratory: Negative.   Cardiovascular: Negative.   Gastrointestinal: Negative.   Genitourinary: Negative.   Musculoskeletal: Negative.   Skin: Negative.   Neurological: Negative.   Endo/Heme/Allergies: Negative.   Psychiatric/Behavioral: Negative.     Family History  Problem Relation Age of Onset  . Breast cancer Sister     lung  . Cancer Maternal Grandmother   . Multiple births Maternal Grandmother   . Multiple births Paternal Grandmother   . Cancer Paternal Grandmother   . Thyroid disease Mother   . Heart failure Mother   . Parkinson's disease Father     Social History   Social History  . Marital Status: Married    Spouse Name: N/A  . Number of Children: N/A  . Years of Education: N/A   Occupational History  . Not on file.   Social History Main Topics  . Smoking status: Former Research scientist (life sciences)  . Smokeless tobacco: Never Used  . Alcohol Use: No  . Drug Use: No  . Sexual Activity:    Partners: Male     Comment: husband vasectomy   Other Topics Concern    . Not on file   Social History Narrative    Environmental and Social history  Lives in a house with a dry environment, dogs located inside the household, carpeting in the bedroom, no plastic for the bed or pillow, and no smokers located inside the household. She works as a Marketing executive at the [employer]   Objective:   Filed Vitals:   11/06/15 0943  BP: 138/72  Pulse: 64  Temp: 98 F (36.7 C)  Resp: 18   Height: 5' 0.04" (152.5 cm) Weight: 144 lb 2.9 oz (65.4 kg)  Physical Exam  Constitutional: She is well-developed, well-nourished, and in no distress.  Raspy voice  HENT:  Head: Normocephalic.  Right Ear: Tympanic membrane, external  ear and ear canal normal.  Left Ear: Tympanic membrane, external ear and ear canal normal.  Nose: Nose normal. No mucosal edema or rhinorrhea.  Mouth/Throat: Uvula is midline, oropharynx is clear and moist and mucous membranes are normal. No oropharyngeal exudate.  Eyes: Conjunctivae are normal.  Neck: Trachea normal. No tracheal tenderness present. No tracheal deviation present. No thyromegaly present.  Cardiovascular: Normal rate, regular rhythm, S1 normal, S2 normal and normal heart sounds.   No murmur heard. Pulmonary/Chest: Breath sounds normal. No stridor. No respiratory distress. She has no wheezes. She has no rales.  Musculoskeletal: She exhibits no edema.  Lymphadenopathy:       Head (right side): No tonsillar adenopathy present.       Head (left side): No tonsillar adenopathy present.    She has no cervical adenopathy.    She has no axillary adenopathy.  Neurological: She is alert. Gait normal.  Skin: No rash noted. She is not diaphoretic. No erythema. Nails show no clubbing.  Psychiatric: Mood and affect normal.     Diagnostics: Allergy skin tests were performed. She demonstrated hypersensitivity against house dust mite and mold  Spirometry was performed and demonstrated an FEV1 of 2.36 @ 116 % of predicted.Following the  administration of nebulized albuterol her FEV1 did not change significantly  The patient had an Asthma Control Test with the following results:  .     Assessment and Plan:    1. Asthma, not well controlled, moderate persistent, with acute exacerbation   2. Allergic rhinoconjunctivitis   3. LPRD (laryngopharyngeal reflux disease)     1. Allergen avoidance measures  2. Treat and prevent inflammation:   A. Symbicort 160 2 inhalations twice a day with spacer  B. OTC Rhinocort one spray each nostril one time per day  3. Treat and prevent reflux:   A. dramatically consolidate caffeine use slowly over next 4 weeks  B. increase omeprazole 40 mg twice a day  C. start ranitidine 300 mg in evening  4. If needed:   A. ProAir HFA 2 puffs every 4-6 hours  C. over-the-counter antihistamine  5. Replace all throat clearing with swallowing maneuver  6. Review previous chest x-ray reports  7. Return to clinic in 4 weeks or earlier if problem  Arleny's history is consistent with significant irritation directed at her respiratory tract most likely from a reflux trigger. We'll get her to increase her therapy for reflux as noted above and she will need to perform behavioral modification including elimination of her caffeine use as this is no doubt contributing to her reflux. I'll keep her on anti-inflammatory medications for her respiratory tract, the possibility of some degree of eosinophilic inflammation. Her episodes of choking and strangling certainly suggest the possibility of a motor neuron disease or other neurological abnormality but at this point in time it does not warrant further investigation until we can see what type of response we get with therapy noted above directed against both inflammation and reflux. I'll regroup with her in a possibly 4 weeks or earlier if there is a problem.  Jiles Prows, MD [facility]

## 2015-11-06 NOTE — Patient Instructions (Addendum)
  1. Allergen avoidance measures  2. Treat and prevent inflammation:   A. Symbicort 160 2 inhalations twice a day with spacer  B. OTC Rhinocort one spray each nostril one time per day  3. Treat and prevent reflux:   A. dramatically consolidate caffeine use slowly over next 4 weeks  B. increase omeprazole 40 mg twice a day  C. start ranitidine 300 mg in evening  4. If needed:   A. ProAir HFA 2 puffs every 4-6 hours  C. over-the-counter antihistamine  5. Replace all throat clearing with swallowing maneuver  6. Review previous chest x-ray reports  7. Return to clinic in 4 weeks or earlier if problem

## 2015-11-19 ENCOUNTER — Ambulatory Visit: Payer: PPO | Admitting: Allergy and Immunology

## 2015-11-29 DIAGNOSIS — J209 Acute bronchitis, unspecified: Secondary | ICD-10-CM | POA: Diagnosis not present

## 2015-12-10 ENCOUNTER — Ambulatory Visit (INDEPENDENT_AMBULATORY_CARE_PROVIDER_SITE_OTHER): Payer: PPO | Admitting: Allergy and Immunology

## 2015-12-10 ENCOUNTER — Encounter: Payer: Self-pay | Admitting: Allergy and Immunology

## 2015-12-10 VITALS — BP 110/72 | HR 72 | Resp 12

## 2015-12-10 DIAGNOSIS — H1010 Acute atopic conjunctivitis, unspecified eye: Secondary | ICD-10-CM | POA: Diagnosis not present

## 2015-12-10 DIAGNOSIS — J387 Other diseases of larynx: Secondary | ICD-10-CM | POA: Diagnosis not present

## 2015-12-10 DIAGNOSIS — J4540 Moderate persistent asthma, uncomplicated: Secondary | ICD-10-CM | POA: Diagnosis not present

## 2015-12-10 DIAGNOSIS — J309 Allergic rhinitis, unspecified: Secondary | ICD-10-CM

## 2015-12-10 DIAGNOSIS — K219 Gastro-esophageal reflux disease without esophagitis: Secondary | ICD-10-CM

## 2015-12-10 NOTE — Progress Notes (Signed)
Follow-up Note  Referring Provider: Rochel Brome, MD Primary Provider: Rochel Brome, MD Date of Office Visit: 12/10/2015  Subjective:   Olivia Cruz (DOB: 08/13/1951) is a 65 y.o. female who returns to the [facility] on 12/10/2015 in re-evaluation of the following:  HPI: Donica returns to this clinic in reevaluation of her respiratory tract symptoms including a combination of asthma, LPR, and allergic rhinoconjunctivitis. She has had a rather dramatic response to medical therapy with improvement regarding both her lungs and throat. She no longer has any coughing and throat clearing and drainage in her throat have decreased dramatically. She's had very little issues with her upper airways. She has performed allergen avoidance measures directed against house dust mite and has consolidate her caffeine to 1/2 cup of coffee per day. She's been consistently using Symbicort and Rhinocort and omeprazole and ranitidine.    Medication List           albuterol 108 (90 Base) MCG/ACT inhaler  Commonly known as:  PROAIR HFA  INHALE TWO PUFFS EVERY 4-6 HOURS IF NEEDED FOR COUGH OR WHEEZE     budesonide-formoterol 160-4.5 MCG/ACT inhaler  Commonly known as:  SYMBICORT  INHALE TWO PUFFS TWICE DAILY TO PREVENT COUGH OR WHEEZE. RINSE MOUTH AFTER USE.     CALCIUM + D PO  Take 1,200 mg by mouth daily.     citalopram 20 MG tablet  Commonly known as:  CELEXA  Take 10 mg by mouth daily.     levothyroxine 50 MCG tablet  Commonly known as:  SYNTHROID, LEVOTHROID  TK 1 T PO D     multivitamin capsule  Take 1 capsule by mouth daily.     nitrofurantoin (macrocrystal-monohydrate) 100 MG capsule  Commonly known as:  MACROBID  Take 1 capsule (100 mg total) by mouth as needed. One  post coital as directed     omeprazole 40 MG capsule  Commonly known as:  PRILOSEC  TAKE ONE CAPSULE TWICE DAILY     ranitidine 300 MG tablet  Commonly known as:  ZANTAC  TAKE ONE TABLET EACH  EVENING     valACYclovir 500 MG tablet  Commonly known as:  VALTREX  Take 1 tablet (500 mg total) by mouth 2 (two) times daily. Take bid x 3 days at onset        Past Medical History  Diagnosis Date  . STD (sexually transmitted disease)     HSV II  . Insomnia   . Osteoarthritis   . Anxiety   . Depression   . Migraines   . Hypothyroid     Past Surgical History  Procedure Laterality Date  . Breast surgery Left     times 2  . Ganglion cyst excision Left     foot  . Hemorrhoid surgery      Allergies  Allergen Reactions  . Augmentin [Amoxicillin-Pot Clavulanate] Nausea And Vomiting  . Codeine Nausea Only  . Oxycodone   . Sulfa Antibiotics     Review of systems negative except as noted in HPI / PMHx or noted below:  Review of Systems  Constitutional: Negative.   HENT: Negative.   Eyes: Negative.   Respiratory: Negative.   Cardiovascular: Negative.   Gastrointestinal: Negative.   Genitourinary: Negative.   Musculoskeletal: Negative.   Skin: Negative.   Neurological: Negative.   Endo/Heme/Allergies: Negative.   Psychiatric/Behavioral: Negative.      Objective:   Filed Vitals:   12/10/15 1126  BP: 110/72  Pulse:  72  Resp: 12          Physical Exam  Constitutional: She is well-developed, well-nourished, and in no distress.  HENT:  Head: Normocephalic.  Right Ear: Tympanic membrane, external ear and ear canal normal.  Left Ear: Tympanic membrane, external ear and ear canal normal.  Nose: Nose normal. No mucosal edema or rhinorrhea.  Mouth/Throat: Uvula is midline, oropharynx is clear and moist and mucous membranes are normal. No oropharyngeal exudate.  Eyes: Conjunctivae are normal.  Neck: Trachea normal. No tracheal tenderness present. No tracheal deviation present. No thyromegaly present.  Cardiovascular: Normal rate, regular rhythm, S1 normal, S2 normal and normal heart sounds.   No murmur heard. Pulmonary/Chest: Breath sounds normal. No stridor.  No respiratory distress. She has no wheezes. She has no rales.  Musculoskeletal: She exhibits no edema.  Lymphadenopathy:       Head (right side): No tonsillar adenopathy present.       Head (left side): No tonsillar adenopathy present.    She has no cervical adenopathy.  Neurological: She is alert. Gait normal.  Skin: No rash noted. She is not diaphoretic. No erythema. Nails show no clubbing.  Psychiatric: Mood and affect normal.    Diagnostics:    Spirometry was performed and demonstrated an FEV1 of 2.25 at 110 % of predicted.  The patient had an Asthma Control Test with the following results: ACT Total Score: 17.    Assessment and Plan:   1. Asthma, moderate persistent, well-controlled   2. Allergic rhinoconjunctivitis   3. LPRD (laryngopharyngeal reflux disease)     1. Continue Allergen avoidance measures  2. Continue to Treat and prevent inflammation:   A. Symbicort 160 2 inhalations twice a day with spacer  B. OTC Rhinocort one spray each nostril one time per day  3. Continue to Treat and prevent reflux:   A. consolidate caffeine use slowly over next 4 weeks  B. continue omeprazole 40 mg twice a day  C. continue ranitidine 300 mg in evening  4. If needed:   A. ProAir HFA 2 puffs every 4-6 hours  C. over-the-counter antihistamine  5. Return to clinic in 8 weeks or earlier if problem  Shelvia has had a very good response to medical therapy I would like to keep her on this plan for a full 12 weeks and thus I'll see her back in this clinic in 8 weeks. If she continues to do well there may be an opportunity to consolidate her medical therapy without visit.  Allena Katz, MD [place]

## 2015-12-10 NOTE — Patient Instructions (Addendum)
   1. Continue Allergen avoidance measures  2. Continue to Treat and prevent inflammation:   A. Symbicort 160 2 inhalations twice a day with spacer  B. OTC Rhinocort one spray each nostril one time per day  3. Continue to Treat and prevent reflux:   A. consolidate caffeine use slowly over next 4 weeks  B. continue omeprazole 40 mg twice a day  C. continue ranitidine 300 mg in evening  4. If needed:   A. ProAir HFA 2 puffs every 4-6 hours  C. over-the-counter antihistamine  5. Return to clinic in 8 weeks or earlier if problem

## 2015-12-24 ENCOUNTER — Telehealth: Payer: Self-pay | Admitting: *Deleted

## 2015-12-24 ENCOUNTER — Other Ambulatory Visit: Payer: Self-pay

## 2015-12-24 MED ORDER — DEXLANSOPRAZOLE 60 MG PO CPDR: 60.0000 mg | DELAYED_RELEASE_CAPSULE | Freq: Every morning | ORAL | Status: DC

## 2015-12-24 NOTE — Telephone Encounter (Signed)
Dr .Kozlow, please advise.  

## 2015-12-24 NOTE — Telephone Encounter (Signed)
PT IS WONDERING IF THERE IS ANY OTHER ACID REFLUX MEDICATION THAT IS STRONGER THAN WHAT SHE IS CURRENTLY TAKING (OMPERAZOLE 40MG  BID). PH[facility]ACY [facility] [place].

## 2015-12-24 NOTE — Telephone Encounter (Signed)
Left message for patient to call the office. Rx sent to [facility].

## 2015-12-24 NOTE — Telephone Encounter (Signed)
Please informed patient that she can try Dexilant 60 mg one tablet one time per day plus continue ranitidine

## 2015-12-25 NOTE — Telephone Encounter (Signed)
ADVISED PT OF INSTRUCTIONS

## 2015-12-28 ENCOUNTER — Telehealth: Payer: Self-pay

## 2015-12-28 NOTE — Telephone Encounter (Signed)
Pt states that Dexilant is not helping at all. She is just as bad as before with cough and voice hoarsness. Had [employer] last week and it set the reflux issue off.

## 2015-12-28 NOTE — Telephone Encounter (Signed)
Can try dexilant two times a day and let us know how this works in a week or so.

## 2015-12-28 NOTE — Telephone Encounter (Signed)
PT ADVISED OF INSTRUCTIONS

## 2016-01-07 DIAGNOSIS — R5383 Other fatigue: Secondary | ICD-10-CM | POA: Diagnosis not present

## 2016-01-07 DIAGNOSIS — E663 Overweight: Secondary | ICD-10-CM | POA: Diagnosis not present

## 2016-01-07 DIAGNOSIS — E038 Other specified hypothyroidism: Secondary | ICD-10-CM | POA: Diagnosis not present

## 2016-01-12 DIAGNOSIS — J45991 Cough variant asthma: Secondary | ICD-10-CM | POA: Diagnosis not present

## 2016-01-12 DIAGNOSIS — E038 Other specified hypothyroidism: Secondary | ICD-10-CM | POA: Diagnosis not present

## 2016-01-12 DIAGNOSIS — F320 Major depressive disorder, single episode, mild: Secondary | ICD-10-CM | POA: Diagnosis not present

## 2016-01-12 DIAGNOSIS — K219 Gastro-esophageal reflux disease without esophagitis: Secondary | ICD-10-CM | POA: Diagnosis not present

## 2016-01-12 DIAGNOSIS — F5101 Primary insomnia: Secondary | ICD-10-CM | POA: Diagnosis not present

## 2016-01-14 DIAGNOSIS — N3942 Incontinence without sensory awareness: Secondary | ICD-10-CM | POA: Diagnosis not present

## 2016-01-14 DIAGNOSIS — Z0000 Encounter for general adult medical examination without abnormal findings: Secondary | ICD-10-CM | POA: Diagnosis not present

## 2016-01-14 DIAGNOSIS — N3946 Mixed incontinence: Secondary | ICD-10-CM | POA: Diagnosis not present

## 2016-01-26 DIAGNOSIS — R32 Unspecified urinary incontinence: Secondary | ICD-10-CM | POA: Diagnosis not present

## 2016-01-26 DIAGNOSIS — R350 Frequency of micturition: Secondary | ICD-10-CM | POA: Diagnosis not present

## 2016-01-26 DIAGNOSIS — N3942 Incontinence without sensory awareness: Secondary | ICD-10-CM | POA: Diagnosis not present

## 2016-02-04 ENCOUNTER — Encounter: Payer: Self-pay | Admitting: Allergy and Immunology

## 2016-02-04 ENCOUNTER — Ambulatory Visit (INDEPENDENT_AMBULATORY_CARE_PROVIDER_SITE_OTHER): Payer: PPO | Admitting: Allergy and Immunology

## 2016-02-04 VITALS — BP 128/82 | HR 84 | Resp 20

## 2016-02-04 DIAGNOSIS — J4540 Moderate persistent asthma, uncomplicated: Secondary | ICD-10-CM | POA: Diagnosis not present

## 2016-02-04 DIAGNOSIS — K219 Gastro-esophageal reflux disease without esophagitis: Secondary | ICD-10-CM

## 2016-02-04 DIAGNOSIS — J309 Allergic rhinitis, unspecified: Secondary | ICD-10-CM

## 2016-02-04 DIAGNOSIS — J387 Other diseases of larynx: Secondary | ICD-10-CM

## 2016-02-04 DIAGNOSIS — H1010 Acute atopic conjunctivitis, unspecified eye: Secondary | ICD-10-CM | POA: Diagnosis not present

## 2016-02-04 DIAGNOSIS — G479 Sleep disorder, unspecified: Secondary | ICD-10-CM | POA: Diagnosis not present

## 2016-02-04 NOTE — Patient Instructions (Signed)
   1. Continue Allergen avoidance measures  2. Continue to Treat and prevent inflammation:   A. Hold off on using Symbicort   2. OTC Rhinocort one spray each nostril one time per day  3. Continue to Treat and prevent reflux:   A. Continue to remain away from caffeine and chocolate  B. continue Dexilant 60 mg in AM  C. continue ranitidine 300 mg in evening  4. If needed:   A. ProAir HFA 2 puffs every 4-6 hours  C. over-the-counter antihistamine  5. Obtain a sleep study  6. Upper endoscopy with Dr. Lyndel Safe.  7. Nissen fundoplication?  8. Return to clinic in 12 weeks or earlier if problem

## 2016-02-04 NOTE — Progress Notes (Signed)
Follow-up Note  Referring Provider: Rochel Brome, MD Primary Provider: Rochel Brome, MD Date of Office Visit: 02/04/2016  Subjective:   Olivia Cruz (DOB: 01/14/1951) is a 65 y.o. female who returns to the [facility] on 02/04/2016 in re-evaluation of the following:  HPI: Olivia Cruz returns to this clinic in reevaluation of her asthma, LPR, and allergic rhinoconjunctivitis. I last saw her in his clinic in April 2017.  Her asthma has been under excellent control and she's had very little issues with shortness of breath or chest tightness or need to use a bronchodilator. She has stopped her Symbicort over the course the past 2 weeks and she does not really think that there is been any difference with her airway with discontinuation of this agent.  However, she still coughs, and her cough is certainly tied up with lots of throat clearing and intermittent raspy voice and hoarseness. In review, she did have evaluation by Dr. Gaylyn Cheers who felt that her laryngeal appearance was very consistent with reflux-induced respiratory disease. She's now using a combination of Dexilant and ranitidine which is definitely working a lot better regarding her classic reflux symptoms and may also be helping her throat somewhat. However, it should be noted that she has very bad reflux and if she eats the wrong type of food she can have regurgitation up into her throat. This can happen several times a week. She is no longer drinking any caffeine or eating any chocolate.  Her nose has been doing quite well. She's had very little problems with her airway and has not required any antibiotic to treat an infection.  The other issue that we discussed today was her sleep. She has very poor maintenance of sleep. She is up 12 times a night for some unknown reason. She finds it hard to go back to sleep. She feels unrefreshed in the morning and is sleepy through the day. She will take a two-hour nap occasionally after  work.    Medication List           albuterol 108 (90 Base) MCG/ACT inhaler  Commonly known as:  PROAIR HFA  INHALE TWO PUFFS EVERY 4-6 HOURS IF NEEDED FOR COUGH OR WHEEZE     budesonide-formoterol 160-4.5 MCG/ACT inhaler  Commonly known as:  SYMBICORT  INHALE TWO PUFFS TWICE DAILY TO PREVENT COUGH OR WHEEZE. RINSE MOUTH AFTER USE.     CALCIUM + D PO  Take 1,200 mg by mouth daily.     citalopram 20 MG tablet  Commonly known as:  CELEXA  Take 10 mg by mouth daily.     dexlansoprazole 60 MG capsule  Commonly known as:  DEXILANT  Take 1 capsule (60 mg total) by mouth every morning.     eszopiclone 1 MG Tabs tablet  Commonly known as:  LUNESTA  Take 1 tablet by mouth at bedtime.     levothyroxine 50 MCG tablet  Commonly known as:  SYNTHROID, LEVOTHROID  TK 1 T PO D     multivitamin capsule  Take 1 capsule by mouth daily.     MYRBETRIQ 25 MG Tb24 tablet  Generic drug:  mirabegron ER  Take 25 mg by mouth daily.     ranitidine 300 MG tablet  Commonly known as:  ZANTAC  TAKE ONE TABLET EACH EVENING     RHINOCORT ALLERGY 32 MCG/ACT nasal spray  Generic drug:  budesonide  Place 1 spray into both nostrils daily. Reported on 02/04/2016  valACYclovir 500 MG tablet  Commonly known as:  VALTREX  Take 1 tablet (500 mg total) by mouth 2 (two) times daily. Take bid x 3 days at onset        Past Medical History  Diagnosis Date  . STD (sexually transmitted disease)     HSV II  . Insomnia   . Osteoarthritis   . Anxiety   . Depression   . Migraines   . Hypothyroid     Past Surgical History  Procedure Laterality Date  . Breast surgery Left     times 2  . Ganglion cyst excision Left     foot  . Hemorrhoid surgery      Allergies  Allergen Reactions  . Augmentin [Amoxicillin-Pot Clavulanate] Nausea And Vomiting  . Codeine Nausea Only  . Oxycodone   . Sulfa Antibiotics     Review of systems negative except as noted in HPI / PMHx or noted below:  Review of  Systems  Constitutional: Negative.   HENT: Negative.   Eyes: Negative.   Respiratory: Negative.   Cardiovascular: Negative.   Gastrointestinal: Negative.   Genitourinary: Negative.   Musculoskeletal: Negative.   Skin: Negative.   Neurological: Negative.   Endo/Heme/Allergies: Negative.   Psychiatric/Behavioral: Negative.      Objective:   Filed Vitals:   02/04/16 1102  BP: 128/82  Pulse: 84  Resp: 20          Physical Exam  Constitutional: She is well-developed, well-nourished, and in no distress.  Raspy, throat clearing  HENT:  Head: Normocephalic.  Right Ear: Tympanic membrane, external ear and ear canal normal.  Left Ear: Tympanic membrane, external ear and ear canal normal.  Nose: Nose normal. No mucosal edema or rhinorrhea.  Mouth/Throat: Uvula is midline, oropharynx is clear and moist and mucous membranes are normal. No oropharyngeal exudate.  Eyes: Conjunctivae are normal.  Neck: Trachea normal. No tracheal tenderness present. No tracheal deviation present. No thyromegaly present.  Cardiovascular: Normal rate, regular rhythm, S1 normal, S2 normal and normal heart sounds.   No murmur heard. Pulmonary/Chest: Breath sounds normal. No stridor. No respiratory distress. She has no wheezes. She has no rales.  Musculoskeletal: She exhibits no edema.  Lymphadenopathy:       Head (right side): No tonsillar adenopathy present.       Head (left side): No tonsillar adenopathy present.    She has no cervical adenopathy.  Neurological: She is alert. Gait normal.  Skin: No rash noted. She is not diaphoretic. No erythema. Nails show no clubbing.  Psychiatric: Mood and affect normal.    Diagnostics:    Spirometry was performed and demonstrated an FEV1 of 2.41 at 120 % of predicted.  The patient had an Asthma Control Test with the following results: ACT Total Score: 18.    Assessment and Plan:   1. Asthma, moderate persistent, well-controlled   2. Allergic  rhinoconjunctivitis   3. LPRD (laryngopharyngeal reflux disease)   4. Sleep disorder     1. Continue Allergen avoidance measures  2. Continue to Treat and prevent inflammation:   A. Hold off on using Symbicort   2. OTC Rhinocort one spray each nostril one time per day  3. Continue to Treat and prevent reflux:   A. Continue to remain away from caffeine and chocolate  B. continue Dexilant 60 mg in AM  C. continue ranitidine 300 mg in evening  4. If needed:   A. ProAir HFA 2 puffs every 4-6 hours  C.  over-the-counter antihistamine  5. Obtain a sleep study  6. Upper endoscopy with Dr. Lyndel Safe.  7. Nissen fundoplication?  8. Return to clinic in 12 weeks or earlier if problem  Olivia Cruz has 2 issues. I think that her reflux and her reflux-induced respiratory disease is still active and I would like for her to regroup to see Dr. Lyndel Safe to consider the possibility of using a Nissen fundoplication to address this issue. Even in the face of very good behavioral modification and using 2 agents for reflux it still is an active issue. The second major issue that needs to be addressed is the fact that she probably has sleep apnea and we'll obtain a sleep study to investigate this possibility. Concerning her asthma, I'm going to see how she does without a controller agent at this point in time as I suspect that a fair amount of her respiratory tract symptoms that were present over the course of the past several years are probably tied up with reflux more than anything else. Certainly if this is inaccurate then we will need to have her restart a controller agent and will just see what happens as we go through the next several weeks to months.  Allena Katz, MD [place]

## 2016-03-09 ENCOUNTER — Ambulatory Visit (HOSPITAL_BASED_OUTPATIENT_CLINIC_OR_DEPARTMENT_OTHER): Payer: PPO | Attending: Allergy and Immunology | Admitting: Internal Medicine

## 2016-03-09 VITALS — Ht 61.0 in | Wt 140.0 lb

## 2016-03-09 DIAGNOSIS — I493 Ventricular premature depolarization: Secondary | ICD-10-CM | POA: Insufficient documentation

## 2016-03-09 DIAGNOSIS — R0683 Snoring: Secondary | ICD-10-CM | POA: Insufficient documentation

## 2016-03-09 DIAGNOSIS — R5383 Other fatigue: Secondary | ICD-10-CM | POA: Insufficient documentation

## 2016-03-09 DIAGNOSIS — Z79899 Other long term (current) drug therapy: Secondary | ICD-10-CM | POA: Diagnosis not present

## 2016-03-13 DIAGNOSIS — R5383 Other fatigue: Secondary | ICD-10-CM

## 2016-03-13 NOTE — Procedures (Signed)
   Patient Name: Olivia Cruz, Olivia Cruz Date: 03/09/2016 Gender: Female D.O.B: May 16, 1951 Age (years): 65 Referring Provider: Allena Katz Height (inches): 61 Interpreting Physician: Baird Lyons MD, ABSM Weight (lbs): 140 RPSGT: Carolin Coy BMI: 26 MRN: [id number] Neck Size: 13.50 CLINICAL INFORMATION Sleep Study Type: NPSG   Indication for sleep study: Fatigue   Epworth Sleepiness Score: 14   SLEEP STUDY TECHNIQUE As per the AASM Manual for the Scoring of Sleep and Associated Events v2.3 (April 2016) with a hypopnea requiring 4% desaturations. The channels recorded and monitored were frontal, central and occipital EEG, electrooculogram (EOG), submentalis EMG (chin), nasal and oral airflow, thoracic and abdominal wall motion, anterior tibialis EMG, snore microphone, electrocardiogram, and pulse oximetry.  MEDICATIONS Patient's medications include: charted for review Medications self-administered by patient during sleep study : .MELATONIN, calicium D, celex, myrbetriq, DAILY MULTIPLE VITAMIN, ZANTAC.  SLEEP ARCHITECTURE The study was initiated at 10:55:21 PM and ended at 4:50:07 AM. Sleep onset time was 30.7 minutes and the sleep efficiency was 77.8%. The total sleep time was 276.0 minutes. Stage REM latency was 181.0 minutes. The patient spent 11.41% of the night in stage N1 sleep, 78.08% in stage N2 sleep, 0.00% in stage N3 and 10.51% in REM. Alpha intrusion was absent. Supine sleep was 51.11%. Wake after sleep onset 48 minutes  RESPIRATORY PARAMETERS The overall apnea/hypopnea index (AHI) was 2.0 per hour. There were 2 total apneas, including 0 obstructive, 1 central and 1 mixed apneas. There were 7 hypopneas and 34 RERAs. The AHI during Stage REM sleep was 0.0 per hour. AHI while supine was 3.4 per hour. The mean oxygen saturation was 92.62%. The minimum SpO2 during sleep was 88.00%. Moderate snoring was noted during this study.  CARDIAC DATA The 2 lead EKG  demonstrated sinus rhythm. The mean heart rate was 67.04 beats per minute. Other EKG findings include: PVCs.  LEG MOVEMENT DATA The total PLMS were 0 with a resulting PLMS index of 0.00. Associated arousal with leg movement index was 0.0 .  IMPRESSIONS - No significant obstructive sleep apnea occurred during this study (AHI = 2.0/h). - No significant central sleep apnea occurred during this study (CAI = 0.2/h). - The patient had minimal or no oxygen desaturation during the study (Min O2 = 88.00%) - The patient snored with Moderate snoring volume. - EKG findings include PVCs. - Clinically significant periodic limb movements did not occur during sleep. No significant associated arousals.  DIAGNOSIS - Normal study - Primary Snoring (786.09 [R06.83 ICD-10])  RECOMMENDATIONS - Avoid alcohol, sedatives and other CNS depressants that may worsen sleep apnea and disrupt normal sleep architecture. - Sleep hygiene should be reviewed to assess factors that may improve sleep quality. - Weight management and regular exercise should be initiated or continued if appropriate.  [Electronically signed] 03/13/2016 05:32 PM  Baird Lyons MD, [place], American Board of Sleep Medicine   NPI: [id number]  [place], American Board of Sleep Medicine  ELECTRONICALLY SIGNED ON:  03/13/2016, 5:27 PM [place] PH: [phone]   FX: [phone] [place]

## 2016-03-14 NOTE — Progress Notes (Signed)
Please inform patient that sleep study did not identify any significant sleep apnea

## 2016-03-22 DIAGNOSIS — K219 Gastro-esophageal reflux disease without esophagitis: Secondary | ICD-10-CM | POA: Diagnosis not present

## 2016-03-22 DIAGNOSIS — R0789 Other chest pain: Secondary | ICD-10-CM | POA: Diagnosis not present

## 2016-03-23 ENCOUNTER — Encounter: Payer: Self-pay | Admitting: Gastroenterology

## 2016-03-23 ENCOUNTER — Telehealth: Payer: Self-pay

## 2016-03-23 DIAGNOSIS — Z79899 Other long term (current) drug therapy: Secondary | ICD-10-CM | POA: Diagnosis not present

## 2016-03-23 DIAGNOSIS — K219 Gastro-esophageal reflux disease without esophagitis: Secondary | ICD-10-CM | POA: Diagnosis not present

## 2016-03-23 DIAGNOSIS — E039 Hypothyroidism, unspecified: Secondary | ICD-10-CM | POA: Diagnosis not present

## 2016-03-23 DIAGNOSIS — D131 Benign neoplasm of stomach: Secondary | ICD-10-CM | POA: Diagnosis not present

## 2016-03-23 DIAGNOSIS — K2900 Acute gastritis without bleeding: Secondary | ICD-10-CM | POA: Diagnosis not present

## 2016-03-23 DIAGNOSIS — R0789 Other chest pain: Secondary | ICD-10-CM | POA: Diagnosis not present

## 2016-03-23 DIAGNOSIS — F418 Other specified anxiety disorders: Secondary | ICD-10-CM | POA: Diagnosis not present

## 2016-03-23 DIAGNOSIS — K317 Polyp of stomach and duodenum: Secondary | ICD-10-CM | POA: Diagnosis not present

## 2016-03-23 DIAGNOSIS — K2950 Unspecified chronic gastritis without bleeding: Secondary | ICD-10-CM | POA: Diagnosis not present

## 2016-03-23 HISTORY — PX: UPPER GI ENDOSCOPY: SHX6162

## 2016-03-23 NOTE — Telephone Encounter (Signed)
Pt called. Stated that she had her Upper GI with Dr. Lyndel Safe. He reported to her that there were several small polyps for which he biopsied, otherwise no erosions or abnormalities. What would be the next step? Still not able to sleep at night.

## 2016-03-23 NOTE — Telephone Encounter (Signed)
Pt advised that sleep study showed no significant sleep abnormalities per Dr. Neldon Mc.

## 2016-03-24 DIAGNOSIS — N3942 Incontinence without sensory awareness: Secondary | ICD-10-CM | POA: Diagnosis not present

## 2016-03-24 DIAGNOSIS — R351 Nocturia: Secondary | ICD-10-CM | POA: Diagnosis not present

## 2016-03-28 MED ORDER — CYPROHEPTADINE HCL 4 MG PO TABS: ORAL_TABLET | ORAL | 3 refills | Status: DC

## 2016-03-28 NOTE — Telephone Encounter (Signed)
Please have patient start Periactin at nighttime. 4 mg tablet. May start with one half tablet and build up to 2 tablets at bedtime. Needs to contact us in 2 weeks with the response.

## 2016-03-28 NOTE — Telephone Encounter (Signed)
Patient advised of instructions and rx sent 

## 2016-04-05 ENCOUNTER — Other Ambulatory Visit: Payer: Self-pay | Admitting: *Deleted

## 2016-04-05 ENCOUNTER — Telehealth: Payer: Self-pay | Admitting: Allergy and Immunology

## 2016-04-05 MED ORDER — DEXLANSOPRAZOLE 60 MG PO CPDR: 60.0000 mg | DELAYED_RELEASE_CAPSULE | Freq: Every morning | ORAL | 1 refills | Status: DC

## 2016-04-05 MED ORDER — RANITIDINE HCL 300 MG PO TABS: ORAL_TABLET | ORAL | 1 refills | Status: DC

## 2016-04-05 NOTE — Telephone Encounter (Signed)
Rx sent to pharmacy   

## 2016-04-05 NOTE — Telephone Encounter (Signed)
Needs 90 day supply of ranitidine and dexlansoprazole.

## 2016-04-07 ENCOUNTER — Other Ambulatory Visit: Payer: Self-pay | Admitting: *Deleted

## 2016-04-07 MED ORDER — OMEPRAZOLE 40 MG PO CPDR
DELAYED_RELEASE_CAPSULE | ORAL | 1 refills | Status: DC
Start: 2016-04-07 — End: 2016-11-17

## 2016-04-21 DIAGNOSIS — H2513 Age-related nuclear cataract, bilateral: Secondary | ICD-10-CM | POA: Diagnosis not present

## 2016-05-02 ENCOUNTER — Ambulatory Visit (INDEPENDENT_AMBULATORY_CARE_PROVIDER_SITE_OTHER): Payer: PPO | Admitting: Allergy and Immunology

## 2016-05-02 ENCOUNTER — Encounter: Payer: Self-pay | Admitting: Allergy and Immunology

## 2016-05-02 VITALS — BP 110/80 | HR 80 | Resp 18

## 2016-05-02 DIAGNOSIS — H1010 Acute atopic conjunctivitis, unspecified eye: Secondary | ICD-10-CM | POA: Diagnosis not present

## 2016-05-02 DIAGNOSIS — J309 Allergic rhinitis, unspecified: Secondary | ICD-10-CM | POA: Diagnosis not present

## 2016-05-02 DIAGNOSIS — J4540 Moderate persistent asthma, uncomplicated: Secondary | ICD-10-CM | POA: Diagnosis not present

## 2016-05-02 DIAGNOSIS — J387 Other diseases of larynx: Secondary | ICD-10-CM | POA: Diagnosis not present

## 2016-05-02 DIAGNOSIS — G479 Sleep disorder, unspecified: Secondary | ICD-10-CM

## 2016-05-02 DIAGNOSIS — K219 Gastro-esophageal reflux disease without esophagitis: Secondary | ICD-10-CM

## 2016-05-02 NOTE — Progress Notes (Signed)
Follow-up Note  Referring Provider: Rochel Brome, MD Primary Provider: Rochel Brome, MD Date of Office Visit: 05/02/2016  Subjective:   Olivia Cruz (DOB: 04-22-1951) is a 66 y.o. female who returns to the [facility] on 05/02/2016 in re-evaluation of the following:  HPI: Olivia Cruz returns to this clinic in evaluation of her asthma and LPR and allergic rhinoconjunctivitis and sleep dysfunction. I've not seen her in his clinic since June 2017.  Her nose has doing very well at this point in time. She has not had any issues involving her asthma and does not use a short acting bronchodilator and can exercise without any difficulty although she did have some musculoskeletal issues involving her knees that has limited her ability to exercise.  Her throat is doing much better at this point in time. She occasionally has some issues with intermittent throat clearing and some hoarseness. If she eats chocolate she gets very hoarse. She's had no coughing at all.  Olivia Cruz did visit with Dr. Lyndel Safe who performed an upper endoscopy and felt as though there was no significant sequela from her reflux disease and did not change her therapy. Apparently she did have some gastric polyps which were benign.  Her sleep is going quite well at this point in time. She is now using Periactin 4 mg tablet one half tablet at bedtime. Her daytime sleepiness is much less as well now that she's sleeping better.    Medication List      albuterol 108 (90 Base) MCG/ACT inhaler Commonly known as:  PROAIR HFA INHALE TWO PUFFS EVERY 4-6 HOURS IF NEEDED FOR COUGH OR WHEEZE   ALLEGRA ALLERGY 180 MG tablet Generic drug:  fexofenadine Take 180 mg by mouth as needed for allergies or rhinitis.   budesonide-formoterol 160-4.5 MCG/ACT inhaler Commonly known as:  SYMBICORT INHALE TWO PUFFS TWICE DAILY TO PREVENT COUGH OR WHEEZE. RINSE MOUTH AFTER USE.   CALCIUM + D PO Take 1,200 mg by mouth daily.     citalopram 20 MG tablet Commonly known as:  CELEXA Take 10 mg by mouth daily.   cyproheptadine 4 MG tablet Commonly known as:  PERIACTIN Take 1/2 up to 2 tablets at bedtime as directed   HAIR SKIN NAILS PO Take by mouth.   levothyroxine 50 MCG tablet Commonly known as:  SYNTHROID, LEVOTHROID TK 1 T PO D   multivitamin capsule Take 1 capsule by mouth daily.   MYRBETRIQ 50 MG Tb24 tablet Generic drug:  mirabegron ER   nitroGLYCERIN 0.4 MG SL tablet Commonly known as:  NITROSTAT Place 0.4 mg under the tongue.   omeprazole 40 MG capsule Commonly known as:  PRILOSEC Take one capsule twice daily   ranitidine 300 MG tablet Commonly known as:  ZANTAC TAKE ONE TABLET EACH EVENING   RHINOCORT ALLERGY 32 MCG/ACT nasal spray Generic drug:  budesonide Place 1 spray into both nostrils daily. Reported on 02/04/2016   valACYclovir 500 MG tablet Commonly known as:  VALTREX Take 1 tablet (500 mg total) by mouth 2 (two) times daily. Take bid x 3 days at onset       Past Medical History:  Diagnosis Date  . Allergic rhinoconjunctivitis   . Anxiety   . Asthma   . Depression   . Hypothyroid   . Insomnia   . Laryngopharyngeal reflux (LPR)   . Migraines   . Osteoarthritis   . STD (sexually transmitted disease)    HSV II    Past Surgical History:  Procedure Laterality Date  . BREAST SURGERY Left    times 2  . GANGLION CYST EXCISION Left    foot  . HEMORRHOID SURGERY      Allergies  Allergen Reactions  . Codeine Anaphylaxis and Nausea Only  . Augmentin [Amoxicillin-Pot Clavulanate] Nausea And Vomiting  . Oxycodone   . Sulfa Antibiotics     Review of systems negative except as noted in HPI / PMHx or noted below:  Review of Systems  Constitutional: Negative.   HENT: Negative.   Eyes: Negative.   Respiratory: Negative.   Cardiovascular: Negative.   Gastrointestinal: Negative.   Genitourinary: Negative.   Musculoskeletal: Negative.   Skin: Negative.    Neurological: Negative.   Endo/Heme/Allergies: Negative.   Psychiatric/Behavioral: Negative.      Objective:   Vitals:   05/02/16 1646  BP: 110/80  Pulse: 80  Resp: 18          Physical Exam  Constitutional: She is well-developed, well-nourished, and in no distress.  HENT:  Head: Normocephalic.  Right Ear: Tympanic membrane, external ear and ear canal normal.  Left Ear: Tympanic membrane, external ear and ear canal normal.  Nose: Nose normal. No mucosal edema or rhinorrhea.  Mouth/Throat: Uvula is midline, oropharynx is clear and moist and mucous membranes are normal. No oropharyngeal exudate.  Eyes: Conjunctivae are normal.  Neck: Trachea normal. No tracheal tenderness present. No tracheal deviation present. No thyromegaly present.  Cardiovascular: Normal rate, regular rhythm, S1 normal, S2 normal and normal heart sounds.   No murmur heard. Pulmonary/Chest: Breath sounds normal. No stridor. No respiratory distress. She has no wheezes. She has no rales.  Musculoskeletal: She exhibits no edema.  Lymphadenopathy:       Head (right side): No tonsillar adenopathy present.       Head (left side): No tonsillar adenopathy present.    She has no cervical adenopathy.  Neurological: She is alert. Gait normal.  Skin: No rash noted. She is not diaphoretic. No erythema. Nails show no clubbing.  Psychiatric: Mood and affect normal.    Diagnostics: Results of a sleep study obtained on 03/09/2016 did not identify any sleep apnea.   Spirometry was performed and demonstrated an FEV1 of 2.31 at 115 % of predicted.  The patient had an Asthma Control Test with the following results: ACT Total Score: 23.    Assessment and Plan:   1. Asthma, moderate persistent, well-controlled   2. Allergic rhinoconjunctivitis   3. LPRD (laryngopharyngeal reflux disease)   4. Sleep disorder      1. Continue Allergen avoidance measures  2. Continue to Treat and prevent inflammation:   A. OTC  Rhinocort one spray each nostril one time per day  3. Continue to Treat and prevent reflux:   A. Continue to remain away from caffeine and chocolate  B. continue Dexilant 60 mg in AM  C. continue ranitidine 300 mg in evening  4. Continue to treat sleep dysfunction:   A. Periactin 4 mg tablet - 1/2 to 1 tablet at bedtime  5. If needed:   A. ProAir HFA 2 puffs every 4-6 hours  C. over-the-counter antihistamine  6  Obtain fall flu vaccine  7. Return to clinic in 12 weeks or earlier if problem  Infantof is doing quite well at this point in time while consistently using therapy directed against inflammation of her upper airway and reflux and Periactin for her sleep dysfunction. I will continue to have her use this therapy and see her back  in this clinic in approximately 6 months or earlier if there is a problem. There is no need for having her use her rescue medications at this point as she has done well without the use of any anti-inflammatory medications delivered to her lower respiratory tract.  Allena Katz, MD [place]

## 2016-05-02 NOTE — Patient Instructions (Signed)
   1. Continue Allergen avoidance measures  2. Continue to Treat and prevent inflammation:   A. OTC Rhinocort one spray each nostril one time per day  3. Continue to Treat and prevent reflux:   A. Continue to remain away from caffeine and chocolate  B. continue Dexilant 60 mg in AM  C. continue ranitidine 300 mg in evening  4. Continue to treat sleep dysfunction:   A. Periactin 4 mg tablet - 1/2 to 1 tablet at bedtime  5. If needed:   A. ProAir HFA 2 puffs every 4-6 hours  C. over-the-counter antihistamine  6  Obtain fall flu vaccine  7. Return to clinic in 12 weeks or earlier if problem

## 2016-05-23 DIAGNOSIS — Z1231 Encounter for screening mammogram for malignant neoplasm of breast: Secondary | ICD-10-CM | POA: Diagnosis not present

## 2016-06-14 DIAGNOSIS — M25562 Pain in left knee: Secondary | ICD-10-CM | POA: Diagnosis not present

## 2016-07-19 DIAGNOSIS — D2239 Melanocytic nevi of other parts of face: Secondary | ICD-10-CM | POA: Diagnosis not present

## 2016-07-19 DIAGNOSIS — L301 Dyshidrosis [pompholyx]: Secondary | ICD-10-CM | POA: Diagnosis not present

## 2016-07-19 DIAGNOSIS — D225 Melanocytic nevi of trunk: Secondary | ICD-10-CM | POA: Diagnosis not present

## 2016-07-19 DIAGNOSIS — L820 Inflamed seborrheic keratosis: Secondary | ICD-10-CM | POA: Diagnosis not present

## 2016-07-19 DIAGNOSIS — L821 Other seborrheic keratosis: Secondary | ICD-10-CM | POA: Diagnosis not present

## 2016-07-22 ENCOUNTER — Ambulatory Visit: Payer: PPO | Admitting: Obstetrics & Gynecology

## 2016-08-08 DIAGNOSIS — R413 Other amnesia: Secondary | ICD-10-CM | POA: Diagnosis not present

## 2016-08-08 DIAGNOSIS — R51 Headache: Secondary | ICD-10-CM | POA: Diagnosis not present

## 2016-08-08 DIAGNOSIS — R202 Paresthesia of skin: Secondary | ICD-10-CM | POA: Diagnosis not present

## 2016-08-09 DIAGNOSIS — R413 Other amnesia: Secondary | ICD-10-CM | POA: Diagnosis not present

## 2016-08-09 DIAGNOSIS — R51 Headache: Secondary | ICD-10-CM | POA: Diagnosis not present

## 2016-08-12 DIAGNOSIS — J018 Other acute sinusitis: Secondary | ICD-10-CM | POA: Diagnosis not present

## 2016-08-25 DIAGNOSIS — R200 Anesthesia of skin: Secondary | ICD-10-CM | POA: Diagnosis not present

## 2016-08-29 DIAGNOSIS — R202 Paresthesia of skin: Secondary | ICD-10-CM | POA: Diagnosis not present

## 2016-08-29 DIAGNOSIS — Z6828 Body mass index (BMI) 28.0-28.9, adult: Secondary | ICD-10-CM | POA: Diagnosis not present

## 2016-08-29 DIAGNOSIS — Z0001 Encounter for general adult medical examination with abnormal findings: Secondary | ICD-10-CM | POA: Diagnosis not present

## 2016-08-29 DIAGNOSIS — Z23 Encounter for immunization: Secondary | ICD-10-CM | POA: Diagnosis not present

## 2016-08-29 DIAGNOSIS — R413 Other amnesia: Secondary | ICD-10-CM | POA: Diagnosis not present

## 2016-08-30 ENCOUNTER — Telehealth: Payer: Self-pay | Admitting: Allergy and Immunology

## 2016-08-30 NOTE — Telephone Encounter (Signed)
Left message for patient to call office.  

## 2016-08-30 NOTE — Telephone Encounter (Signed)
Please inform Chie that the side effects although possibly from Periactin are somewhat unusual for Periactin use. The only way to determine if they are secondary to Periactin is to stop this medication. In fact, it would probably be best not to use any medication that affects sleep until she can determine whether or not Periactin or similar medications are responsible for her issues.

## 2016-08-30 NOTE — Telephone Encounter (Signed)
Patient informed of Dr. Bruna Potter instructions. She will try to discontinue the Periactin to see if that helps.

## 2016-08-30 NOTE — Telephone Encounter (Signed)
Olivia Cruz went to Dr. Tobie Poet office yesterday for a physical. She talked to Dr.Cox about having some confusion and tingling in her feet.   Dr. Tobie Poet said that periactin has these side effects. Dr. Tobie Poet took Olivia Cruz off this medication for 2 weeks.   Olivia Cruz is concerned because periactin helps her sleep and she doesn't want to start missing sleep like before.   Also Olivia Cruz said she would give Dr. Tobie Poet an update in 2 weeks but wants to know if she should let Dr.Kozlow know what's going on since he prescribed the medication.

## 2016-09-05 ENCOUNTER — Encounter: Payer: Self-pay | Admitting: Obstetrics & Gynecology

## 2016-09-05 ENCOUNTER — Ambulatory Visit (INDEPENDENT_AMBULATORY_CARE_PROVIDER_SITE_OTHER): Payer: PPO | Admitting: Obstetrics & Gynecology

## 2016-09-05 VITALS — BP 122/76 | HR 70 | Resp 16 | Ht 61.5 in | Wt 146.0 lb

## 2016-09-05 DIAGNOSIS — Z205 Contact with and (suspected) exposure to viral hepatitis: Secondary | ICD-10-CM

## 2016-09-05 DIAGNOSIS — Z0000 Encounter for general adult medical examination without abnormal findings: Secondary | ICD-10-CM

## 2016-09-05 DIAGNOSIS — A6009 Herpesviral infection of other urogenital tract: Secondary | ICD-10-CM

## 2016-09-05 DIAGNOSIS — Z124 Encounter for screening for malignant neoplasm of cervix: Secondary | ICD-10-CM

## 2016-09-05 DIAGNOSIS — Z01419 Encounter for gynecological examination (general) (routine) without abnormal findings: Secondary | ICD-10-CM

## 2016-09-05 LAB — POCT URINALYSIS DIPSTICK
Bilirubin, UA: NEGATIVE
Blood, UA: NEGATIVE
Glucose, UA: NEGATIVE
Ketones, UA: NEGATIVE
Leukocytes, UA: NEGATIVE
Nitrite, UA: NEGATIVE
Protein, UA: NEGATIVE
Urobilinogen, UA: NEGATIVE
pH, UA: 5

## 2016-09-05 LAB — HEPATITIS C ANTIBODY: HCV Ab: NEGATIVE

## 2016-09-05 MED ORDER — NITROFURANTOIN MONOHYD MACRO 100 MG PO CAPS: 100.0000 mg | ORAL_CAPSULE | ORAL | 2 refills | Status: DC | PRN

## 2016-09-05 MED ORDER — VALACYCLOVIR HCL 500 MG PO TABS: 500.0000 mg | ORAL_TABLET | Freq: Every day | ORAL | 2 refills | Status: DC

## 2016-09-05 NOTE — Progress Notes (Signed)
66 y.o. [id number] Married Caucasian F here for annual exam.  Doing well, now.  Had chest pain issues thi spast year and after "all sorts of testing" she was diagnosed with reflux related asthma.  Had cardiac, pulmonology, ENT, and allergy evaluation.  Also saw PCP several times this year.    Patient's last menstrual period was 08/22/2005.          Sexually active: No.  The current method of family planning is vasectomy.    Exercising: Yes.    walking, gym workouts Smoker:  Former smoker  Health Maintenance: Pap:  03/18/15 negative History of abnormal Pap:  yes MMG:  10/17 at [facility] per patient normal  Colonoscopy:  2014 per patient- repeat 5 years BMD:   10/23/09, pt thinks she's had another one TDaP:  12/03/12  Pneumonia vaccine(s):  2016 & 1/18 with PCP Zostavax:   02/19/11  Hep C testing: drawn today Screening Labs: PCP, Hb today: PCP, Urine today: normal    reports that she has quit smoking. She has never used smokeless tobacco. She reports that she does not drink alcohol or use drugs.  Past Medical History:  Diagnosis Date  . Allergic rhinoconjunctivitis   . Anxiety   . Asthma   . Depression   . Hypothyroid   . Insomnia   . Laryngopharyngeal reflux (LPR)   . Migraines   . Osteoarthritis   . STD (sexually transmitted disease)    HSV II    Past Surgical History:  Procedure Laterality Date  . BREAST SURGERY Left    times 2  . GANGLION CYST EXCISION Left    foot  . HEMORRHOID SURGERY      Current Outpatient Prescriptions  Medication Sig Dispense Refill  . budesonide (RHINOCORT ALLERGY) 32 MCG/ACT nasal spray Place 1 spray into both nostrils daily. Reported on 02/04/2016    . Calcium Carbonate-Vitamin D (CALCIUM + D PO) Take 1,200 mg by mouth daily.    . citalopram (CELEXA) 20 MG tablet Take 10 mg by mouth daily.     . fexofenadine (ALLEGRA ALLERGY) 180 MG tablet Take 180 mg by mouth as needed for allergies or rhinitis.    Marland Kitchen levothyroxine (SYNTHROID, LEVOTHROID)  50 MCG tablet TK 1 T PO D  1  . Multiple Vitamin (MULTIVITAMIN) capsule Take 1 capsule by mouth daily.    . Multiple Vitamins-Minerals (HAIR SKIN NAILS PO) Take by mouth.    Marland Kitchen MYRBETRIQ 50 MG TB24 tablet     . omeprazole (PRILOSEC) 40 MG capsule Take one capsule twice daily 180 capsule 1  . ranitidine (ZANTAC) 300 MG tablet TAKE ONE TABLET EACH EVENING 90 tablet 1  . valACYclovir (VALTREX) 500 MG tablet Take 1 tablet (500 mg total) by mouth 2 (two) times daily. Take bid x 3 days at onset 60 tablet 6   No current facility-administered medications for this visit.     Family History  Problem Relation Age of Onset  . Breast cancer Sister     lung  . Cancer Maternal Grandmother   . Multiple births Maternal Grandmother   . Multiple births Paternal Grandmother   . Cancer Paternal Grandmother   . Thyroid disease Mother   . Heart failure Mother   . Parkinson's disease Father     ROS:  Pertinent items are noted in HPI.  Otherwise, a comprehensive ROS was negative.  Exam:   BP 122/76 (BP Location: Right Arm, Patient Position: Sitting, Cuff Size: Normal)   Pulse 70  Resp 16   Ht 5' 1.5" (1.562 m)   Wt 146 lb (66.2 kg)   LMP 08/22/2005   BMI 27.14 kg/m   Height: 5' 1.5" (156.2 cm)  Ht Readings from Last 3 Encounters:  09/05/16 5' 1.5" (1.562 m)  03/09/16 5\' 1"  (1.549 m)  11/06/15 5' 0.04" (1.525 m)    General appearance: alert, cooperative and appears stated age Head: Normocephalic, without obvious abnormality, atraumatic Neck: no adenopathy, supple, symmetrical, trachea midline and thyroid normal to inspection and palpation Lungs: clear to auscultation bilaterally Breasts: normal appearance, no masses or tenderness Heart: regular rate and rhythm Abdomen: soft, non-tender; bowel sounds normal; no masses,  no organomegaly Extremities: extremities normal, atraumatic, no cyanosis or edema Skin: Skin color, texture, turgor normal. No rashes or lesions Lymph nodes: Cervical,  supraclavicular, and axillary nodes normal. No abnormal inguinal nodes palpated Neurologic: Grossly normal   Pelvic: External genitalia:  no lesions              Urethra:  normal appearing urethra with no masses, tenderness or lesions              Bartholins and Skenes: normal                 Vagina: normal appearing vagina with normal color and discharge, no lesions              Cervix: no lesions              Pap taken: Yes.   Bimanual Exam:  Uterus:  normal size, contour, position, consistency, mobility, non-tender              Adnexa: normal adnexa and no mass, fullness, tenderness               Rectovaginal: Confirms               Anus:  normal sphincter tone, no lesions  Chaperone was present for exam.  A:  Well Woman with normal exam PMP, no HRT Atrophic vaginitis, using estrace cream twice weekly H/O recurrent UTI OAB with urge incontinence Dense breast tissue Cystocele Reflux induced asthma  P:        Mammogram guidelines reviewed Pap obtained today Valtrex 500mg  bid x 3 days with outbreaks.  Rx given to pt today Macrobid 100mg  pox coitally.  #30/3 Hep c antibody obtained today Other blood work done with Pap AEX 1 year or follow up prn

## 2016-09-07 LAB — IPS PAP SMEAR ONLY

## 2016-10-19 DIAGNOSIS — R2681 Unsteadiness on feet: Secondary | ICD-10-CM | POA: Diagnosis not present

## 2016-10-19 DIAGNOSIS — F320 Major depressive disorder, single episode, mild: Secondary | ICD-10-CM | POA: Diagnosis not present

## 2016-10-19 DIAGNOSIS — K219 Gastro-esophageal reflux disease without esophagitis: Secondary | ICD-10-CM | POA: Diagnosis not present

## 2016-10-19 DIAGNOSIS — E038 Other specified hypothyroidism: Secondary | ICD-10-CM | POA: Diagnosis not present

## 2016-10-19 DIAGNOSIS — F5101 Primary insomnia: Secondary | ICD-10-CM | POA: Diagnosis not present

## 2016-10-19 DIAGNOSIS — R911 Solitary pulmonary nodule: Secondary | ICD-10-CM | POA: Diagnosis not present

## 2016-10-19 DIAGNOSIS — J45991 Cough variant asthma: Secondary | ICD-10-CM | POA: Diagnosis not present

## 2016-10-20 ENCOUNTER — Other Ambulatory Visit: Payer: Self-pay | Admitting: Allergy and Immunology

## 2016-10-31 ENCOUNTER — Ambulatory Visit: Payer: PPO | Admitting: Allergy and Immunology

## 2016-11-07 ENCOUNTER — Ambulatory Visit: Payer: PPO | Admitting: Allergy and Immunology

## 2016-11-10 ENCOUNTER — Ambulatory Visit: Payer: PPO | Admitting: Allergy and Immunology

## 2016-11-15 DIAGNOSIS — R938 Abnormal findings on diagnostic imaging of other specified body structures: Secondary | ICD-10-CM | POA: Diagnosis not present

## 2016-11-15 DIAGNOSIS — R918 Other nonspecific abnormal finding of lung field: Secondary | ICD-10-CM | POA: Diagnosis not present

## 2016-11-16 DIAGNOSIS — E034 Atrophy of thyroid (acquired): Secondary | ICD-10-CM | POA: Diagnosis not present

## 2016-11-16 DIAGNOSIS — E782 Mixed hyperlipidemia: Secondary | ICD-10-CM | POA: Diagnosis not present

## 2016-11-16 DIAGNOSIS — R5383 Other fatigue: Secondary | ICD-10-CM | POA: Diagnosis not present

## 2016-11-16 DIAGNOSIS — E038 Other specified hypothyroidism: Secondary | ICD-10-CM | POA: Diagnosis not present

## 2016-11-16 DIAGNOSIS — F5101 Primary insomnia: Secondary | ICD-10-CM | POA: Diagnosis not present

## 2016-11-17 ENCOUNTER — Ambulatory Visit (INDEPENDENT_AMBULATORY_CARE_PROVIDER_SITE_OTHER): Payer: PPO | Admitting: Allergy and Immunology

## 2016-11-17 ENCOUNTER — Encounter: Payer: Self-pay | Admitting: Allergy and Immunology

## 2016-11-17 VITALS — BP 120/82 | HR 68 | Resp 14

## 2016-11-17 DIAGNOSIS — K219 Gastro-esophageal reflux disease without esophagitis: Secondary | ICD-10-CM | POA: Diagnosis not present

## 2016-11-17 DIAGNOSIS — J3089 Other allergic rhinitis: Secondary | ICD-10-CM

## 2016-11-17 DIAGNOSIS — J4520 Mild intermittent asthma, uncomplicated: Secondary | ICD-10-CM

## 2016-11-17 MED ORDER — MUPIROCIN 2 % EX OINT: 1.0000 "application " | TOPICAL_OINTMENT | Freq: Three times a day (TID) | CUTANEOUS | 0 refills | Status: DC

## 2016-11-17 MED ORDER — OMEPRAZOLE 40 MG PO CPDR: DELAYED_RELEASE_CAPSULE | ORAL | 1 refills | Status: DC

## 2016-11-17 NOTE — Progress Notes (Signed)
Follow-up Note  Referring Provider: Rochel Brome, MD Primary Provider: Rochel Brome, MD Date of Office Visit: 11/17/2016  Subjective:   Olivia Cruz (DOB: 1950-11-13) is a 66 y.o. female who returns to the [facility] on 11/17/2016 in re-evaluation of the following:  HPI: Olivia Cruz presents to this clinic in evaluation of her asthma and allergic rhinoconjunctivitis and LPR and sleep dysfunction. I've not seen her in this clinic since September 2017.  She was doing wonderful regarding her respiratory tract until she visited [place] a few weeks back and developed very significant sneezing and nose blowing and coughing. Although she is better now her nose is really raw and bleeding and she notices that some of the mucus coming out is very thick. Prior to this point in time she rarely had any issues with coughing and her throat was doing quite well and her nose was doing well while consistently using therapy directed against inflammation of her upper airway with a nasal steroid and utilizing aggressive therapy directed against laryngopharyngeal reflux. Rarely if ever does she use a short acting bronchodilator and she has no problems exercising.  As well, she apparently developed a "foggy head" and memory problems and feet burning sometime this winter that apparently was evaluated with a head CT scan which was normal and it was recommended that she discontinue her Periactin. She did discontinue that agent and maybe her feet got better but her memory issue did not and she could not sleep without Periactin and thus she was placed on Lunesta by her primary care doctor. She is sleeping a little bit better but she never really did clear her memory issue.  She did obtain the flu vaccine this year.  Allergies as of 11/17/2016      Reactions   Codeine Anaphylaxis, Nausea Only   Ambien [zolpidem Tartrate] Other (See Comments)   Memory loss per patient   Augmentin [amoxicillin-pot  Clavulanate] Nausea And Vomiting   Oxycodone    Sulfa Antibiotics       Medication List      ALLEGRA ALLERGY 180 MG tablet Generic drug:  fexofenadine Take 180 mg by mouth as needed for allergies or rhinitis.   CALCIUM + D PO Take 1,200 mg by mouth daily.   citalopram 20 MG tablet Commonly known as:  CELEXA Take 10 mg by mouth daily.   eszopiclone 2 MG Tabs tablet Commonly known as:  LUNESTA   HAIR SKIN NAILS PO Take by mouth.   levothyroxine 50 MCG tablet Commonly known as:  SYNTHROID, LEVOTHROID TK 1 T PO D   multivitamin capsule Take 1 capsule by mouth daily.   mupirocin ointment 2 % Commonly known as:  BACTROBAN Place 1 application into the nose 3 (three) times daily.   MYRBETRIQ 50 MG Tb24 tablet Generic drug:  mirabegron ER   nitrofurantoin (macrocrystal-monohydrate) 100 MG capsule Commonly known as:  MACROBID Take 1 capsule (100 mg total) by mouth as needed.   omeprazole 40 MG capsule Commonly known as:  PRILOSEC Take one capsule twice daily   ranitidine 300 MG tablet Commonly known as:  ZANTAC TAKE ONE TABLET BY MOUTH EVERY EVENING   RHINOCORT ALLERGY 32 MCG/ACT nasal spray Generic drug:  budesonide Place 1 spray into both nostrils daily. Reported on 02/04/2016   valACYclovir 500 MG tablet Commonly known as:  VALTREX Take 1 tablet (500 mg total) by mouth daily.       Past Medical History:  Diagnosis Date  . Allergic  rhinoconjunctivitis   . Anxiety   . Asthma   . Depression   . Hypothyroid   . Insomnia   . Laryngopharyngeal reflux (LPR)   . Migraines   . Osteoarthritis   . STD (sexually transmitted disease)    HSV II    Past Surgical History:  Procedure Laterality Date  . BREAST SURGERY Left    times 2  . GANGLION CYST EXCISION Left    foot  . HEMORRHOID SURGERY      Review of systems negative except as noted in HPI / PMHx or noted below:  Review of Systems  Constitutional: Negative.   HENT: Negative.   Eyes: Negative.     Respiratory: Negative.   Cardiovascular: Negative.   Gastrointestinal: Negative.   Genitourinary: Negative.   Musculoskeletal: Negative.   Skin: Negative.   Neurological: Negative.   Endo/Heme/Allergies: Negative.   Psychiatric/Behavioral: Negative.      Objective:   Vitals:   11/17/16 1549  BP: 120/82  Pulse: 68  Resp: 14          Physical Exam  Constitutional: She is well-developed, well-nourished, and in no distress.  HENT:  Head: Normocephalic.  Right Ear: Tympanic membrane, external ear and ear canal normal.  Left Ear: Tympanic membrane, external ear and ear canal normal.  Nose: Mucosal edema (Erythematous, diffuse areas of punctate bleeding, thick purulent coating) present.  Mouth/Throat: Uvula is midline, oropharynx is clear and moist and mucous membranes are normal. No oropharyngeal exudate.  Eyes: Conjunctivae are normal.  Neck: Trachea normal. No tracheal tenderness present. No tracheal deviation present. No thyromegaly present.  Cardiovascular: Normal rate, regular rhythm, S1 normal, S2 normal and normal heart sounds.   No murmur heard. Pulmonary/Chest: Breath sounds normal. No stridor. No respiratory distress. She has no wheezes. She has no rales.  Musculoskeletal: She exhibits no edema.  Lymphadenopathy:       Head (right side): No tonsillar adenopathy present.       Head (left side): No tonsillar adenopathy present.    She has no cervical adenopathy.  Neurological: She is alert. Gait normal.  Skin: No rash noted. She is not diaphoretic. No erythema. Nails show no clubbing.  Psychiatric: Mood and affect normal.    Diagnostics:    Spirometry was performed and demonstrated an FEV1 of 2.29 at 105 % of predicted.  Assessment and Plan:   1. Asthma, mild intermittent, well-controlled   2. Other allergic rhinitis   3. LPRD (laryngopharyngeal reflux disease)      1. Continue Allergen avoidance measures  2. Continue to Treat and prevent  inflammation:   A. OTC Rhinocort one spray each nostril 1-7 times per week  3. Continue to Treat and prevent reflux:   A. Continue to remain away from caffeine and chocolate  B. DECREASE omeprazole 40 mg in AM  C. continue ranitidine 300 mg in evening  4. For recent nasal issue, use nasal saline followed by bactroban ointment three times a day until nose better (usually 7-10 days)  5. If needed:   A. ProAir HFA 2 puffs every 4-6 hours  C. over-the-counter antihistamine  6. Return to clinic in 6 months or earlier if problem  I think that Olivia Cruz contracted a viral respiratory tract infection while she was visiting [place] that appears to be improving but unfortunately she appears have developed some significant rhinitis which may actually be bacterial in nature and I'm going to treat her with topical Bactroban at the same time that she stops her  Rhinocort for at least a week or so. We will see if she can decrease her omeprazole to 1 time per day use. I'll see her back in this clinic in 6 months or earlier if there is a problem.  Allena Katz, MD Allergy / Immunology [place]

## 2016-11-17 NOTE — Patient Instructions (Addendum)
   1. Continue Allergen avoidance measures  2. Continue to Treat and prevent inflammation:   A. OTC Rhinocort one spray each nostril 1-7 times per week  3. Continue to Treat and prevent reflux:   A. Continue to remain away from caffeine and chocolate  B. DECREASE omeprazole 40 mg in AM  C. continue ranitidine 300 mg in evening  4. For recent nasal issue, use nasal saline followed by bactroban ointment three times a day until nose better (usually 7-10 days)  5. If needed:   A. ProAir HFA 2 puffs every 4-6 hours  C. over-the-counter antihistamine  6. Return to clinic in 6 months or earlier if problem

## 2016-11-22 DIAGNOSIS — H1132 Conjunctival hemorrhage, left eye: Secondary | ICD-10-CM | POA: Diagnosis not present

## 2016-12-19 ENCOUNTER — Ambulatory Visit (INDEPENDENT_AMBULATORY_CARE_PROVIDER_SITE_OTHER): Payer: PPO | Admitting: Allergy and Immunology

## 2016-12-19 ENCOUNTER — Encounter: Payer: Self-pay | Admitting: Allergy and Immunology

## 2016-12-19 VITALS — BP 124/78 | HR 80 | Resp 18

## 2016-12-19 DIAGNOSIS — J0100 Acute maxillary sinusitis, unspecified: Secondary | ICD-10-CM | POA: Diagnosis not present

## 2016-12-19 DIAGNOSIS — J3089 Other allergic rhinitis: Secondary | ICD-10-CM | POA: Diagnosis not present

## 2016-12-19 DIAGNOSIS — K219 Gastro-esophageal reflux disease without esophagitis: Secondary | ICD-10-CM | POA: Diagnosis not present

## 2016-12-19 DIAGNOSIS — J4520 Mild intermittent asthma, uncomplicated: Secondary | ICD-10-CM | POA: Diagnosis not present

## 2016-12-19 NOTE — Patient Instructions (Signed)
   1. Continue Allergen avoidance measures  2. Continue to Treat and prevent inflammation:   A. OTC Rhinocort one spray each nostril 1-7 times per week  3. Continue to Treat and prevent reflux:   A. Continue to remain away from caffeine and chocolate  B. omeprazole 40 mg in AM  C. ranitidine 300 mg in evening  4. For recent nasal issue, use the following:   A. nasal saline several times per day  B. OTC Mucinex DM 2 tablets twice a day  C. increase omeprazole to twice a day  5. If needed:   A. ProAir HFA 2 puffs every 4-6 hours  C. over-the-counter antihistamine  6. Return to clinic in 6 months or earlier if problem

## 2016-12-19 NOTE — Progress Notes (Signed)
Follow-up Note  Referring Provider: Rochel Brome, MD Primary Provider: Rochel Brome, MD Date of Office Visit: 12/19/2016  Subjective:   Olivia Cruz (DOB: 1951/02/01) is a 66 y.o. female who returns to the [facility] on 12/19/2016 in re-evaluation of the following:  HPI: Olivia Cruz presents to this clinic in evaluation of an event that occurred over the course of the past 3 days. She is followed in this clinic for asthma and allergic rhinoconjunctivitis and LPR and sleep dysfunction and her last visit to this clinic was on 11/17/2016 at which point in time she was doing relatively well on a large collection of medical therapy utilized to address these issues.  This past Friday she developed runny nose and blowing and sneezing and developed a nosebleed. She has had some cough and some dark postnasal drip came out of her throat this morning. Her nasal congestion and her cough is disturbing her sleep. She has not had any high fever. She has not had any unusual exposure. She believes that her reflux is under pretty good control. Her asthma has not been bothering her and she has not had to use a short acting bronchodilator. She can smell and taste without any problem. She did developed these symptoms while visiting a [place] environment and staying in a mobile home that might of had mold contamination.  Allergies as of 12/19/2016      Reactions   Codeine Anaphylaxis, Nausea Only   Ambien [zolpidem Tartrate] Other (See Comments)   Memory loss per patient   Augmentin [amoxicillin-pot Clavulanate] Nausea And Vomiting   Oxycodone    Sulfa Antibiotics       Medication List      ALLEGRA ALLERGY 180 MG tablet Generic drug:  fexofenadine Take 180 mg by mouth as needed for allergies or rhinitis.   CALCIUM + D PO Take 1,200 mg by mouth daily.   citalopram 20 MG tablet Commonly known as:  CELEXA Take 10 mg by mouth daily.   eszopiclone 2 MG Tabs tablet Commonly known as:   LUNESTA   HAIR SKIN NAILS PO Take by mouth.   levothyroxine 50 MCG tablet Commonly known as:  SYNTHROID, LEVOTHROID TK 1 T PO D   multivitamin capsule Take 1 capsule by mouth daily.   mupirocin ointment 2 % Commonly known as:  BACTROBAN Place 1 application into the nose 3 (three) times daily.   nitrofurantoin (macrocrystal-monohydrate) 100 MG capsule Commonly known as:  MACROBID Take 1 capsule (100 mg total) by mouth as needed.   omeprazole 40 MG capsule Commonly known as:  PRILOSEC Take one capsule by mouth every morning.   PROAIR HFA 108 (90 Base) MCG/ACT inhaler Generic drug:  albuterol Inhale 2 puffs into the lungs every 4 (four) hours as needed for wheezing or shortness of breath.   ranitidine 300 MG tablet Commonly known as:  ZANTAC TAKE ONE TABLET BY MOUTH EVERY EVENING   RHINOCORT ALLERGY 32 MCG/ACT nasal spray Generic drug:  budesonide Place 1 spray into both nostrils daily. Reported on 02/04/2016   valACYclovir 500 MG tablet Commonly known as:  VALTREX Take 1 tablet (500 mg total) by mouth daily.       Past Medical History:  Diagnosis Date  . Allergic rhinoconjunctivitis   . Anxiety   . Asthma   . Depression   . Hypothyroid   . Insomnia   . Laryngopharyngeal reflux (LPR)   . Migraines   . Osteoarthritis   . STD (sexually transmitted  disease)    HSV II    Past Surgical History:  Procedure Laterality Date  . BREAST SURGERY Left    times 2  . GANGLION CYST EXCISION Left    foot  . HEMORRHOID SURGERY      Review of systems negative except as noted in HPI / PMHx or noted below:  Review of Systems  Constitutional: Negative.   HENT: Negative.   Eyes: Negative.   Respiratory: Negative.   Cardiovascular: Negative.   Gastrointestinal: Negative.   Genitourinary: Negative.   Musculoskeletal: Negative.   Skin: Negative.   Neurological: Negative.   Endo/Heme/Allergies: Negative.   Psychiatric/Behavioral: Negative.      Objective:    Vitals:   12/19/16 1534  BP: 124/78  Pulse: 80  Resp: 18          Physical Exam  Constitutional: She is well-developed, well-nourished, and in no distress.  HENT:  Head: Normocephalic.  Right Ear: Tympanic membrane, external ear and ear canal normal.  Left Ear: Tympanic membrane, external ear and ear canal normal.  Nose: Mucosal edema (erythematous) present. No rhinorrhea.  Mouth/Throat: Uvula is midline, oropharynx is clear and moist and mucous membranes are normal. No oropharyngeal exudate.  Eyes: Conjunctivae are normal.  Neck: Trachea normal. No tracheal tenderness present. No tracheal deviation present. No thyromegaly present.  Cardiovascular: Normal rate, regular rhythm, S1 normal, S2 normal and normal heart sounds.   No murmur heard. Pulmonary/Chest: Breath sounds normal. No stridor. No respiratory distress. She has no wheezes. She has no rales.  Musculoskeletal: She exhibits no edema.  Lymphadenopathy:       Head (right side): No tonsillar adenopathy present.       Head (left side): No tonsillar adenopathy present.    She has no cervical adenopathy.  Neurological: She is alert. Gait normal.  Skin: No rash noted. She is not diaphoretic. No erythema. Nails show no clubbing.  Psychiatric: Mood and affect normal.    Diagnostics: none   Assessment and Plan:   1. Asthma, mild intermittent, well-controlled   2. Other allergic rhinitis   3. LPRD (laryngopharyngeal reflux disease)   4. Acute non-recurrent maxillary sinusitis      1. Continue Allergen avoidance measures  2. Continue to Treat and prevent inflammation:   A. OTC Rhinocort one spray each nostril 1-7 times per week  3. Continue to Treat and prevent reflux:   A. Continue to remain away from caffeine and chocolate  B. omeprazole 40 mg in AM  C. ranitidine 300 mg in evening  4. For recent nasal issue, use the following:   A. nasal saline several times per day  B. OTC Mucinex DM 2 tablets twice a  day  C. increase omeprazole to twice a day  5. If needed:   A. ProAir HFA 2 puffs every 4-6 hours  C. over-the-counter antihistamine  6. Return to clinic in 6 months or earlier if problem  I think that Olivia Cruz has contracted a viral respiratory tract infection and we will treat her conservatively as noted above and assume she will do well and she will contact me should she have significant problems in the face of this therapy. I have asked her to increase her omeprazole to twice a day given the fact that we know she has LPR. I will keep her on anti-inflammatory agents as noted above for her respiratory tract inflammation. If she does well I will see her back in this clinic in 6 months or earlier if there is  a problem.  Allena Katz, MD Allergy / Immunology [place]

## 2016-12-30 DIAGNOSIS — N3946 Mixed incontinence: Secondary | ICD-10-CM | POA: Diagnosis not present

## 2016-12-30 DIAGNOSIS — N3020 Other chronic cystitis without hematuria: Secondary | ICD-10-CM | POA: Diagnosis not present

## 2017-01-06 DIAGNOSIS — J209 Acute bronchitis, unspecified: Secondary | ICD-10-CM | POA: Diagnosis not present

## 2017-01-09 DIAGNOSIS — J111 Influenza due to unidentified influenza virus with other respiratory manifestations: Secondary | ICD-10-CM | POA: Diagnosis not present

## 2017-01-09 DIAGNOSIS — J208 Acute bronchitis due to other specified organisms: Secondary | ICD-10-CM | POA: Diagnosis not present

## 2017-01-17 ENCOUNTER — Other Ambulatory Visit: Payer: Self-pay | Admitting: Allergy and Immunology

## 2017-01-17 ENCOUNTER — Encounter: Payer: Self-pay | Admitting: Allergy

## 2017-01-17 ENCOUNTER — Ambulatory Visit (INDEPENDENT_AMBULATORY_CARE_PROVIDER_SITE_OTHER): Payer: PPO | Admitting: Allergy

## 2017-01-17 VITALS — BP 130/84 | HR 68 | Temp 98.6°F | Resp 18

## 2017-01-17 DIAGNOSIS — J3089 Other allergic rhinitis: Secondary | ICD-10-CM

## 2017-01-17 DIAGNOSIS — K219 Gastro-esophageal reflux disease without esophagitis: Secondary | ICD-10-CM | POA: Diagnosis not present

## 2017-01-17 DIAGNOSIS — J040 Acute laryngitis: Secondary | ICD-10-CM

## 2017-01-17 DIAGNOSIS — J4520 Mild intermittent asthma, uncomplicated: Secondary | ICD-10-CM

## 2017-01-17 MED ORDER — AZELASTINE HCL 0.1 % NA SOLN: 2.0000 | Freq: Two times a day (BID) | NASAL | 5 refills | Status: DC | PRN

## 2017-01-17 NOTE — Patient Instructions (Signed)
   1. Continue Allergen avoidance measures  2. Continue to Treat and prevent inflammation:   A. OTC Rhinocort one spray each nostril 1-7 times per week  B.  Start Astelin antihistamine nasal spray 2 sprays each nostril twice a day (if nose become to dry decrease to once a day use)  3. Continue to Treat and prevent reflux:   A. Continue to remain away from caffeine and chocolate  B. omeprazole 40 mg in AM  C. ranitidine 300 mg in evening  4. For recent nasal and throat issues, use the following:   A. nasal saline several times per day  B. OTC Mucinex DM 2 tablets twice a day  C. increase omeprazole to twice a day  D. Warm salt water gargling at least daily and drink warm/hot teas with honey, soups to help soothe throat           E.  Rest voice as much as possible and drink plenty of fluids  5. If needed:   A. ProAir HFA 2 puffs every 4-6 hours  C. over-the-counter antihistamine  6. Return to clinic in 6 months or earlier if problem

## 2017-01-17 NOTE — Progress Notes (Signed)
Follow-up Note  RE: Olivia Cruz MRN: [id number] DOB: 06-07-1951 Date of Office Visit: 01/17/2017   History of present illness: Olivia Cruz is a 66 y.o. female presenting today for sick visit.  She was last seen in the office on 12/19/16 by Dr. Neldon Mc at which time it was thought she had a viral URI.  She reports she improved from that illness.   She states that she has been sick since around the 18th of this month.  She was seen at an urgent care for issues with cough and hoarseness and was swabbed and was told she was positive for flu and was treated with Tamiflu which she doesn't feel any difference. She was also recommended to take ibuprofen and continue Mucinex. She followed up with her PCP who told her to continue what she was doing and also prescribed a Z-Pak which she completed yesterday.  She feels like she has something stuck in her throat.  She is having hoarseness which is exacerbated by talking.  She reports if she laughs or talks loud she starts coughing and then she gets short of breath due to the cough.  Symptoms are worse at night.  She has been having fatigue with this as well.  She called out sick at the end of last week and was able to rest which she reports did help. She denies any fevers. She is still taking Mucinex, omeprazole twice a day, nasal saline rinse twice a day, rhinocort 3 times a week (due to nose bleeds).  She is also taking a cough syrup with phenergan that she had left over from about a year ago.    Review of systems: Review of Systems  Constitutional: Negative for chills, fever and malaise/fatigue.  HENT: Positive for congestion and sore throat. Negative for ear discharge, ear pain, nosebleeds, sinus pain and tinnitus.   Eyes: Negative for discharge and redness.  Respiratory: Positive for cough and shortness of breath. Negative for sputum production, wheezing and stridor.   Cardiovascular: Negative for chest pain.  Gastrointestinal: Negative for  abdominal pain, diarrhea, heartburn, nausea and vomiting.  Musculoskeletal: Negative for joint pain and myalgias.  Skin: Negative for itching and rash.  Neurological: Negative for headaches.    All other systems negative unless noted above in HPI  Past medical/social/surgical/family history have been reviewed and are unchanged unless specifically indicated below.  No changes  Medication List: Allergies as of 01/17/2017      Reactions   Codeine Anaphylaxis, Nausea Only   Ambien [zolpidem Tartrate] Other (See Comments)   Memory loss per patient   Augmentin [amoxicillin-pot Clavulanate] Nausea And Vomiting   Oxycodone    Sulfa Antibiotics       Medication List       Accurate as of 01/17/17 12:14 PM. Always use your most recent med list.          ALLEGRA ALLERGY 180 MG tablet Generic drug:  fexofenadine Take 180 mg by mouth as needed for allergies or rhinitis.   azelastine 0.1 % nasal spray Commonly known as:  ASTELIN Place 2 sprays into both nostrils 2 (two) times daily as needed for rhinitis. Use in each nostril as directed   CALCIUM + D PO Take 1,200 mg by mouth daily.   citalopram 20 MG tablet Commonly known as:  CELEXA Take 10 mg by mouth daily.   eszopiclone 2 MG Tabs tablet Commonly known as:  LUNESTA   guaiFENesin 600 MG 12 hr tablet Commonly known  as:  MUCINEX Take 600 mg by mouth 2 (two) times daily as needed.   HAIR SKIN NAILS PO Take by mouth.   levothyroxine 50 MCG tablet Commonly known as:  SYNTHROID, LEVOTHROID TK 1 T PO D   multivitamin capsule Take 1 capsule by mouth daily.   nitrofurantoin (macrocrystal-monohydrate) 100 MG capsule Commonly known as:  MACROBID Take 1 capsule (100 mg total) by mouth as needed.   omeprazole 40 MG capsule Commonly known as:  PRILOSEC Take one capsule by mouth every morning.   PROAIR HFA 108 (90 Base) MCG/ACT inhaler Generic drug:  albuterol Inhale 2 puffs into the lungs every 4 (four) hours as needed  for wheezing or shortness of breath.   promethazine 6.25 MG/5ML syrup Commonly known as:  PHENERGAN Take 6.25 mg by mouth 2 (two) times daily as needed for nausea or vomiting.   ranitidine 300 MG tablet Commonly known as:  ZANTAC TAKE ONE TABLET BY MOUTH EVERY EVENING   RHINOCORT ALLERGY 32 MCG/ACT nasal spray Generic drug:  budesonide Place 1 spray into both nostrils daily. Reported on 02/04/2016   valACYclovir 500 MG tablet Commonly known as:  VALTREX Take 1 tablet (500 mg total) by mouth daily.       Known medication allergies: Allergies  Allergen Reactions  . Codeine Anaphylaxis and Nausea Only  . Ambien [Zolpidem Tartrate] Other (See Comments)    Memory loss per patient  . Augmentin [Amoxicillin-Pot Clavulanate] Nausea And Vomiting  . Oxycodone   . Sulfa Antibiotics      Physical examination: Blood pressure 130/84, pulse 68, temperature 98.6 F (37 C), temperature source Tympanic, resp. rate 18, last menstrual period 08/22/2005.  General: Alert, interactive, in no acute distress. HEENT: PERRLA, TMs pearly gray, turbinates moderately edematous with erythemawithout discharge, post-pharynx mildly erythematous with significant clear drainage in the oropharynx Neck: Supple without lymphadenopathy. Lungs: Clear to auscultation without wheezing, rhonchi or rales. {no increased work of breathing. CV: Normal S1, S2 without murmurs. Abdomen: Nondistended, nontender. Skin: Warm and dry, without lesions or rashes. Extremities:  No clubbing, cyanosis or edema. Neuro:   Grossly intact.  Diagnositics/Labs:  Spirometry: FEV1: 2.37L  109%, FVC: 2.79L  97%, ratio consistent with non-obstructive pattern  Assessment and plan:    Acute laryngitis  Allergic rhinitis  LPRD  Mild intermittent asthma, well-controlled  Believe the majority of her symptoms are related to postnasal drainage either from recent URI or from allergies.  She was treated with a Z-Pak and Tamiflu within  the past several weeks which have not seemed to alter symptoms thus do not feel she currently has a bacterial or viral illness at this time that requires additional treatment. He does appear that she has significant postnasal drainage and thus will put on Astelin to her Rhinocort to help decrease this drainage and hopefully decrease irritation in the back of the throat and an vocal cords.  Recommend she rest her voice as much as possible.   Do not feel she has an asthma exacerbation or lower respiratory tract infection as she has normal spirometry and denies any wheezing and she has other reasons to have cough.    1. Continue Allergen avoidance measures  2. Continue to Treat and prevent inflammation:   A. OTC Rhinocort one spray each nostril 1-7 times per week  B.  Start Astelin antihistamine nasal spray 2 sprays each nostril twice a day (if nose become to dry decrease to once a day use)  3. Continue to Treat and prevent reflux:  A. Continue to remain away from caffeine and chocolate  B. omeprazole 40 mg in AM  C. ranitidine 300 mg in evening  4. For recent nasal and throat issues, use the following:   A. nasal saline several times per day  B. OTC Mucinex DM 2 tablets twice a day  C. increase omeprazole to twice a day  D. Warm salt water gargling at least daily and drink warm/hot teas with honey, soups to help soothe throat           E.  Rest voice as much as possible and drink plenty of fluids  5. If needed:   A. ProAir HFA 2 puffs every 4-6 hours  C. over-the-counter antihistamine  6. Return to clinic in 6 months or earlier if problem   I appreciate the opportunity to take part in Kayann's care. Please do not hesitate to contact me with questions.  Sincerely,   Prudy Feeler, MD Allergy/Immunology [facility]

## 2017-03-23 ENCOUNTER — Other Ambulatory Visit: Payer: Self-pay | Admitting: Allergy and Immunology

## 2017-03-27 ENCOUNTER — Other Ambulatory Visit: Payer: Self-pay | Admitting: Allergy and Immunology

## 2017-03-27 MED ORDER — OMEPRAZOLE 40 MG PO CPDR: 40.0000 mg | DELAYED_RELEASE_CAPSULE | Freq: Every morning | ORAL | 1 refills | Status: DC

## 2017-03-27 NOTE — Telephone Encounter (Signed)
Prescription has been sent. Will notify patient.

## 2017-03-27 NOTE — Telephone Encounter (Signed)
Needs a refill on her PRILOSEC.

## 2017-03-28 ENCOUNTER — Other Ambulatory Visit: Payer: Self-pay | Admitting: *Deleted

## 2017-03-28 MED ORDER — OMEPRAZOLE 40 MG PO CPDR: 40.0000 mg | DELAYED_RELEASE_CAPSULE | Freq: Two times a day (BID) | ORAL | 0 refills | Status: DC

## 2017-04-26 ENCOUNTER — Encounter: Payer: Self-pay | Admitting: Podiatry

## 2017-04-26 ENCOUNTER — Ambulatory Visit (INDEPENDENT_AMBULATORY_CARE_PROVIDER_SITE_OTHER): Payer: PPO | Admitting: Podiatry

## 2017-04-26 ENCOUNTER — Ambulatory Visit (INDEPENDENT_AMBULATORY_CARE_PROVIDER_SITE_OTHER): Payer: PPO

## 2017-04-26 DIAGNOSIS — M205X1 Other deformities of toe(s) (acquired), right foot: Secondary | ICD-10-CM | POA: Diagnosis not present

## 2017-04-26 DIAGNOSIS — M79671 Pain in right foot: Secondary | ICD-10-CM

## 2017-04-26 DIAGNOSIS — M7751 Other enthesopathy of right foot: Secondary | ICD-10-CM

## 2017-04-26 NOTE — Progress Notes (Signed)
Patient ID: Olivia Cruz, female   DOB: 28-Dec-1950, 66 y.o.   MRN: [id number]   HPI: C58-year-old otherwise healthy female presents the office today for evaluation of a lump to the right forefoot. Patient weighs she has a bunion that has been painful for several years now. She is tried conservative modalities including shoe gear modifications and they have not alleviate any symptoms with the patient. She presents today for further treatment and evaluation. Patient denies trauma. Aggravated by tight shoe gear and excessive walking. Alleviated by rest and Tylenol     Past Medical History:  Diagnosis Date  . Allergic rhinoconjunctivitis   . Anxiety   . Asthma   . Depression   . Hypothyroid   . Insomnia   . Laryngopharyngeal reflux (LPR)   . Migraines   . Osteoarthritis   . STD (sexually transmitted disease)    HSV II     Physical Exam: General: The patient is alert and oriented x3 in no acute distress.  Dermatology: Skin is warm, dry and supple bilateral lower extremities. Negative for open lesions or macerations.  Vascular: Palpable pedal pulses bilaterally. No edema or erythema noted. Capillary refill within normal limits.  Neurological: Epicritic and protective threshold grossly intact bilaterally.   Musculoskeletal Exam: There is a large prominent spurring noted to the dorsal and medial aspect of the metatarsal head right foot. Range of motion within normal limits to all pedal and ankle joints bilateral. Muscle strength 5/5 in all groups bilateral.   Radiographic Exam:  Normal osseous mineralization. Joint space narrowing with bone spur formation periarticular noted to the first MTPJ right foot. No fracture identified.  Assessment: 1. DJD with bone spur formation right first MPJ   Plan of Care:  1. Patient was evaluated. X-rays reviewed today today we discussed conservative versus surgical management of the presenting pathology. The patient opts for surgical  intervention. All possible complications and details the procedure were explained. No guarantees were expressed or implied. All patient questions answered. 2. Authorization for surgery initiated today. Surgery will consist of cheilectomy first MPJ right foot. 3. Postoperative shoe dispensed today 4. Return to clinic 1 week postop   Edrick Kins, DPM [facility]  Dr. Edrick Kins, DPM    [address], [place] [address]                Office [phone]  Fax [phone]

## 2017-04-26 NOTE — Patient Instructions (Signed)
Pre-Operative Instructions  Congratulations, you have decided to take an important step towards improving your quality of life.  You can be assured that the doctors and staff at [facility] will be with you every step of the way.  Here are some important things you should know:  1. Plan to be at the surgery center/hospital at least 1 (one) hour prior to your scheduled time, unless otherwise directed by the surgical center/hospital staff.  You must have a responsible adult accompany you, remain during the surgery and drive you home.  Make sure you have directions to the surgical center/hospital to ensure you arrive on time. 2. If you are having surgery at [facility] or [facility] hospitals, you will need a copy of your medical history and physical form from your family physician within one month prior to the date of surgery. We will give you a form for your primary physician to complete.  3. We make every effort to accommodate the date you request for surgery.  However, there are times where surgery dates or times have to be moved.  We will contact you as soon as possible if a change in schedule is required.   4. No aspirin/ibuprofen for one week before surgery.  If you are on aspirin, any non-steroidal anti-inflammatory medications (Mobic, Aleve, Ibuprofen) should not be taken seven (7) days prior to your surgery.  You make take Tylenol for pain prior to surgery.  5. Medications - If you are taking daily heart and blood pressure medications, seizure, reflux, allergy, asthma, anxiety, pain or diabetes medications, make sure you notify the surgery center/hospital before the day of surgery so they can tell you which medications you should take or avoid the day of surgery. 6. No food or drink after midnight the night before surgery unless directed otherwise by surgical center/hospital staff. 7. No alcoholic beverages 24-hours prior to surgery.  No smoking 24-hours prior or 24-hours after  surgery. 8. Wear loose pants or shorts. They should be loose enough to fit over bandages, boots, and casts. 9. Don't wear slip-on shoes. Sneakers are preferred. 10. Bring your boot with you to the surgery center/hospital.  Also bring crutches or a walker if your physician has prescribed it for you.  If you do not have this equipment, it will be provided for you after surgery. 11. If you have not been contacted by the surgery center/hospital by the day before your surgery, call to confirm the date and time of your surgery. 12. Leave-time from work may vary depending on the type of surgery you have.  Appropriate arrangements should be made prior to surgery with your employer. 13. Prescriptions will be provided immediately following surgery by your doctor.  Fill these as soon as possible after surgery and take the medication as directed. Pain medications will not be refilled on weekends and must be approved by the doctor. 14. Remove nail polish on the operative foot and avoid getting pedicures prior to surgery. 15. Wash the night before surgery.  The night before surgery wash the foot and leg well with water and the antibacterial soap provided. Be sure to pay special attention to beneath the toenails and in between the toes.  Wash for at least three (3) minutes. Rinse thoroughly with water and dry well with a towel.  Perform this wash unless told not to do so by your physician.  Enclosed: 1 Ice pack (please put in freezer the night before surgery)   1 Hibiclens skin cleaner     Pre-op instructions  If you have any questions regarding the instructions, please do not hesitate to call our office.  [place]: [address], [place], [place] [address] -- [phone]  [place]: [address] [place], [place] [address] -- [phone]  [place]: [address]  [place], [place] [address] -- [phone]  [place]: [address], [address], [place], [place] [address] -- [phone]  Website:  [email or web address] 

## 2017-04-26 NOTE — Progress Notes (Signed)
   Subjective:    Patient ID: Olivia Cruz, female    DOB: July 18, 1951, 66 y.o.   MRN: [id number]  HPI    Review of Systems  Respiratory: Positive for cough and shortness of breath.   Musculoskeletal: Positive for arthralgias.  All other systems reviewed and are negative.      Objective:   Physical Exam        Assessment & Plan:

## 2017-04-27 ENCOUNTER — Telehealth: Payer: Self-pay | Admitting: *Deleted

## 2017-04-27 NOTE — Telephone Encounter (Signed)
I was there yesterday and scheduled surgery for October 4.  I want to reschedule it to November 1.  I realized last night that I have a busy schedule with church the month of October."  November 1 is available, I will get rescheduled.

## 2017-05-01 ENCOUNTER — Telehealth: Payer: Self-pay | Admitting: *Deleted

## 2017-05-01 NOTE — Telephone Encounter (Signed)
"  I need to reschedule my surgery."  I spoke to patient on 04/28/2017.  Surgery was moved from 05/25/2017 to 06/22/2017.

## 2017-05-04 DIAGNOSIS — K5901 Slow transit constipation: Secondary | ICD-10-CM | POA: Diagnosis not present

## 2017-05-04 DIAGNOSIS — K5732 Diverticulitis of large intestine without perforation or abscess without bleeding: Secondary | ICD-10-CM | POA: Diagnosis not present

## 2017-05-15 ENCOUNTER — Telehealth: Payer: Self-pay | Admitting: Obstetrics & Gynecology

## 2017-05-15 NOTE — Telephone Encounter (Signed)
error 

## 2017-05-17 DIAGNOSIS — K5730 Diverticulosis of large intestine without perforation or abscess without bleeding: Secondary | ICD-10-CM | POA: Diagnosis not present

## 2017-05-17 DIAGNOSIS — Z23 Encounter for immunization: Secondary | ICD-10-CM | POA: Diagnosis not present

## 2017-05-22 ENCOUNTER — Ambulatory Visit: Payer: PPO | Admitting: Allergy and Immunology

## 2017-05-22 DIAGNOSIS — K5732 Diverticulitis of large intestine without perforation or abscess without bleeding: Secondary | ICD-10-CM | POA: Diagnosis not present

## 2017-05-22 DIAGNOSIS — N3001 Acute cystitis with hematuria: Secondary | ICD-10-CM | POA: Diagnosis not present

## 2017-05-22 DIAGNOSIS — R10814 Left lower quadrant abdominal tenderness: Secondary | ICD-10-CM | POA: Diagnosis not present

## 2017-05-25 ENCOUNTER — Telehealth: Payer: Self-pay | Admitting: *Deleted

## 2017-05-25 NOTE — Telephone Encounter (Signed)
"  Please give me a call about my surgery I have scheduled on November 1."

## 2017-05-26 NOTE — Telephone Encounter (Signed)
I'm returning your call.  How can I help you?  "Never mind, I am going to keep my surgery scheduled for November 1.  My co-workers were giving me a time about being off then and wanted me to change it but my boss told them no, because I had put in for my time first.  So, leave me for November 1."

## 2017-06-19 ENCOUNTER — Encounter: Payer: Self-pay | Admitting: Allergy and Immunology

## 2017-06-19 ENCOUNTER — Ambulatory Visit (INDEPENDENT_AMBULATORY_CARE_PROVIDER_SITE_OTHER): Payer: PPO | Admitting: Allergy and Immunology

## 2017-06-19 VITALS — BP 142/80 | HR 68 | Resp 16

## 2017-06-19 DIAGNOSIS — J3089 Other allergic rhinitis: Secondary | ICD-10-CM

## 2017-06-19 DIAGNOSIS — K219 Gastro-esophageal reflux disease without esophagitis: Secondary | ICD-10-CM | POA: Diagnosis not present

## 2017-06-19 DIAGNOSIS — J4520 Mild intermittent asthma, uncomplicated: Secondary | ICD-10-CM | POA: Diagnosis not present

## 2017-06-19 MED ORDER — OMEPRAZOLE 40 MG PO CPDR: DELAYED_RELEASE_CAPSULE | ORAL | 1 refills | Status: DC

## 2017-06-19 NOTE — Progress Notes (Signed)
Follow-up Note  Referring Provider: Rochel Brome, MD Primary Provider: Rochel Brome, MD Date of Office Visit: 06/19/2017  Subjective:   Olivia Cruz (DOB: 23-May-1951) is a 66 y.o. female who returns to the [facility] on 06/19/2017 in re-evaluation of the following:  HPI: Olivia Cruz returns to this clinic in reevaluation of her LPR and allergic rhinitis and mild intermittent asthma. I have not seen her in this clinic since April 2018.  In May 2018 she contracted influenza and after a two-week episode resolved that issue and for the most part has done well since that point in time and has not required a systemic steroid to treat an exacerbation of asthma and rarely uses a short acting bronchodilator and can exercise without any problem.  Her nose is doing relatively well while utilizing Rhinocort just a few times per week.  Her reflux and her throat issue is under pretty good control as long she is very careful about caffeine and chocolate consumption and continues on omeprazole and ranitidine.  She did receive the flu vaccine this year.  Allergies as of 06/19/2017      Reactions   Codeine Anaphylaxis, Nausea Only   Ambien [zolpidem Tartrate] Other (See Comments)   Memory loss per patient   Augmentin [amoxicillin-pot Clavulanate] Nausea And Vomiting   Oxycodone    Sulfa Antibiotics       Medication List      ALLEGRA ALLERGY 180 MG tablet Generic drug:  fexofenadine Take 180 mg by mouth as needed for allergies or rhinitis.   CALCIUM + D PO Take 1,200 mg by mouth daily.   HAIR SKIN NAILS PO Take by mouth.   levothyroxine 50 MCG tablet Commonly known as:  SYNTHROID, LEVOTHROID TK 1 T PO D   multivitamin capsule Take 1 capsule by mouth daily.   nitrofurantoin (macrocrystal-monohydrate) 100 MG capsule Commonly known as:  MACROBID Take 1 capsule (100 mg total) by mouth as needed.   omeprazole 40 MG capsule Commonly known as:  PRILOSEC Take 1  capsule (40 mg total) by mouth 2 (two) times daily.   PROAIR HFA 108 (90 Base) MCG/ACT inhaler Generic drug:  albuterol Inhale 2 puffs into the lungs every 4 (four) hours as needed for wheezing or shortness of breath.   ranitidine 300 MG tablet Commonly known as:  ZANTAC TAKE ONE TABLET BY MOUTH EVERY EVENING   RHINOCORT ALLERGY 32 MCG/ACT nasal spray Generic drug:  budesonide Place 1 spray into both nostrils daily. Reported on 02/04/2016   valACYclovir 500 MG tablet Commonly known as:  VALTREX Take 1 tablet (500 mg total) by mouth daily.       Past Medical History:  Diagnosis Date  . Allergic rhinoconjunctivitis   . Anxiety   . Asthma   . Depression   . Diverticulitis   . Hypothyroid   . Insomnia   . Laryngopharyngeal reflux (LPR)   . Migraines   . Osteoarthritis   . STD (sexually transmitted disease)    HSV II    Past Surgical History:  Procedure Laterality Date  . BREAST SURGERY Left    times 2  . GANGLION CYST EXCISION Left    foot  . HEMORRHOID SURGERY      Review of systems negative except as noted in HPI / PMHx or noted below:  Review of Systems  Constitutional: Negative.   HENT: Negative.   Eyes: Negative.   Respiratory: Negative.   Cardiovascular: Negative.   Gastrointestinal: Negative.  Genitourinary: Negative.   Musculoskeletal: Negative.   Skin: Negative.   Neurological: Negative.   Endo/Heme/Allergies: Negative.   Psychiatric/Behavioral: Negative.      Objective:   Vitals:   06/19/17 1716  BP: (!) 142/80  Pulse: 68  Resp: 16          Physical Exam  Constitutional: She is well-developed, well-nourished, and in no distress.  HENT:  Head: Normocephalic.  Right Ear: Tympanic membrane, external ear and ear canal normal.  Left Ear: Tympanic membrane, external ear and ear canal normal.  Nose: Nose normal. No mucosal edema or rhinorrhea.  Mouth/Throat: Uvula is midline, oropharynx is clear and moist and mucous membranes are  normal. No oropharyngeal exudate.  Eyes: Conjunctivae are normal.  Neck: Trachea normal. No tracheal tenderness present. No tracheal deviation present. No thyromegaly present.  Cardiovascular: Normal rate, regular rhythm, S1 normal, S2 normal and normal heart sounds.   No murmur heard. Pulmonary/Chest: Breath sounds normal. No stridor. No respiratory distress. She has no wheezes. She has no rales.  Musculoskeletal: She exhibits no edema.  Lymphadenopathy:       Head (right side): No tonsillar adenopathy present.       Head (left side): No tonsillar adenopathy present.    She has no cervical adenopathy.  Neurological: She is alert. Gait normal.  Skin: No rash noted. She is not diaphoretic. No erythema. Nails show no clubbing.  Psychiatric: Mood and affect normal.    Diagnostics:    Spirometry was performed and demonstrated an FEV1 of 2.34 at 109 % of predicted.  Assessment and Plan:   1. Asthma, mild intermittent, well-controlled   2. Other allergic rhinitis   3. LPRD (laryngopharyngeal reflux disease)      1. Continue Allergen avoidance measures  2. Continue to Treat and prevent inflammation:   A. OTC Rhinocort one spray each nostril 1-7 times per week  3. Continue to Treat and prevent reflux:   A. Continue to remain away from caffeine and chocolate  B. omeprazole 40 mg in AM  C. ranitidine 300 mg in evening  4. If needed:   A. ProAir HFA 2 puffs every 4-6 hours  B. OTC Mucinex DM 2 tablets twice a day  C. over-the-counter antihistamine  5. Return to clinic in 6 months or earlier if problem  Overall Anberlin has done relatively well on her current plan and she will continue to address airway inflammation and reflux-induced respiratory disease as noted above and I will see her back in this clinic in 6 months or earlier if there is a problem.    Allena Katz, MD Allergy / Immunology [facility]

## 2017-06-19 NOTE — Patient Instructions (Signed)
   1. Continue Allergen avoidance measures  2. Continue to Treat and prevent inflammation:   A. OTC Rhinocort one spray each nostril 1-7 times per week  3. Continue to Treat and prevent reflux:   A. Continue to remain away from caffeine and chocolate  B. omeprazole 40 mg in AM  C. ranitidine 300 mg in evening  4. If needed:   A. ProAir HFA 2 puffs every 4-6 hours  B. OTC Mucinex DM 2 tablets twice a day  C. over-the-counter antihistamine  5. Return to clinic in 6 months or earlier if problem

## 2017-06-20 ENCOUNTER — Telehealth: Payer: Self-pay | Admitting: *Deleted

## 2017-06-20 NOTE — Telephone Encounter (Signed)
"  I'm scheduled for surgery on Thursday.  I take a thyroid medicine and an acid reflux medication.  Is it okay for me to take it the morning of surgery?"  Someone from the surgical center normally answers that question.  Someone from the surgical normally calls you a day or two prior to the surgery date.  "I am not supposed to be there until 11 am.  That's a long time to go without eating or drinking anything."  They normally tell people nothing to eat or drink 6-8 hours prior to surgery time.  "Okay, that sounds better but you don't know about the medication?"  No, I do not.

## 2017-06-22 ENCOUNTER — Encounter: Payer: Self-pay | Admitting: Podiatry

## 2017-06-22 DIAGNOSIS — K219 Gastro-esophageal reflux disease without esophagitis: Secondary | ICD-10-CM | POA: Diagnosis not present

## 2017-06-22 DIAGNOSIS — M2021 Hallux rigidus, right foot: Secondary | ICD-10-CM | POA: Diagnosis not present

## 2017-06-22 DIAGNOSIS — M205X1 Other deformities of toe(s) (acquired), right foot: Secondary | ICD-10-CM | POA: Diagnosis not present

## 2017-06-22 DIAGNOSIS — M2011 Hallux valgus (acquired), right foot: Secondary | ICD-10-CM | POA: Diagnosis not present

## 2017-06-27 ENCOUNTER — Telehealth: Payer: Self-pay | Admitting: Podiatry

## 2017-06-27 NOTE — Telephone Encounter (Signed)
Dr. Amalia Hailey did my foot surgery last Thursday. I'm supposed to come in tomorrow for my first post-operative appointment. However, this morning I noticed that of the 4 toes sticking out of the dressing, three of them are purple. I didn't know if that was a bad thing or if they are bruised from surgery. Please call me back at [phone].

## 2017-06-27 NOTE — Telephone Encounter (Signed)
I informed pt the bruising was not unusual and was from the surgery ace pushing the blood out of the foot, so Dr. Amalia Hailey could see, then the tourniquet released and the blood rushing in, breaking the tiny capillaries, and the darkness is the red blood cells dying and turning dark will eventually turn greenish yellow then proceed to the normal color, but as long as the toes are the same temperature as the others that is fine. Pt states understanding.

## 2017-06-28 ENCOUNTER — Encounter: Payer: Self-pay | Admitting: Podiatry

## 2017-06-28 ENCOUNTER — Ambulatory Visit (INDEPENDENT_AMBULATORY_CARE_PROVIDER_SITE_OTHER): Payer: PPO | Admitting: Podiatry

## 2017-06-28 ENCOUNTER — Ambulatory Visit (INDEPENDENT_AMBULATORY_CARE_PROVIDER_SITE_OTHER): Payer: PPO

## 2017-06-28 VITALS — BP 135/86 | HR 60 | Temp 99.4°F | Resp 16

## 2017-06-28 DIAGNOSIS — Z9889 Other specified postprocedural states: Secondary | ICD-10-CM

## 2017-06-28 DIAGNOSIS — M205X1 Other deformities of toe(s) (acquired), right foot: Secondary | ICD-10-CM

## 2017-06-28 DIAGNOSIS — M7751 Other enthesopathy of right foot: Secondary | ICD-10-CM

## 2017-07-02 NOTE — Progress Notes (Signed)
   Subjective:  Patient presents today status post cheilectomy of the right foot. DOS: 06/22/2017. She reports she is doing well overall. She states the pain is tolerable with pain medications. She reports associated nausea for the past two days. She denies fever or chills.    Past Medical History:  Diagnosis Date  . Allergic rhinoconjunctivitis   . Anxiety   . Asthma   . Depression   . Diverticulitis   . Hypothyroid   . Insomnia   . Laryngopharyngeal reflux (LPR)   . Migraines   . Osteoarthritis   . STD (sexually transmitted disease)    HSV II      Objective/Physical Exam Skin incisions appear to be well coapted with sutures and staples intact. No sign of infectious process noted. No dehiscence. No active bleeding noted. Moderate edema noted to the surgical extremity.  Radiographic Exam:  Osteotomies sites appear to be stable with routine healing.  Assessment: 1. s/p cheilectomy right foot. DOS: 06/22/2017   Plan of Care:  1. Patient was evaluated. X-rays reviewed. 2. Dressing changed. 3. Continue wearing post op shoe. 4. Return to clinic in 1 week for staple removal.    Edrick Kins, DPM [facility]  Dr. Edrick Kins, [address]                                        [place], [place] [address]                Office [phone]  Fax [phone]

## 2017-07-04 ENCOUNTER — Telehealth: Payer: Self-pay | Admitting: Allergy and Immunology

## 2017-07-04 ENCOUNTER — Other Ambulatory Visit: Payer: Self-pay | Admitting: *Deleted

## 2017-07-04 MED ORDER — OMEPRAZOLE 40 MG PO CPDR: DELAYED_RELEASE_CAPSULE | ORAL | 1 refills | Status: DC

## 2017-07-04 NOTE — Telephone Encounter (Signed)
Olivia Cruz called in and stated she would like a [facility].

## 2017-07-04 NOTE — Telephone Encounter (Signed)
Rx sent 

## 2017-07-05 ENCOUNTER — Ambulatory Visit (INDEPENDENT_AMBULATORY_CARE_PROVIDER_SITE_OTHER): Payer: PPO | Admitting: Podiatry

## 2017-07-05 DIAGNOSIS — Z9889 Other specified postprocedural states: Secondary | ICD-10-CM

## 2017-07-06 ENCOUNTER — Telehealth: Payer: Self-pay | Admitting: Podiatry

## 2017-07-06 NOTE — Telephone Encounter (Signed)
I was seen yesterday for a post op visit and had my staples removed. I'm in a lot of pain and its very tender. I did walk on it some at the drug store after I left yesterday. I wanted to make sure I'm doing everything correct and of course I'm still in my boot with the big dressing. He said I could take a shower tomorrow and take all that off. I don't want to do anything to make it hurt worse and I'm paranoid because I've never really had surgery. If you would please call me back at [phone]. Thank you.

## 2017-07-06 NOTE — Telephone Encounter (Signed)
Left message informing pt, that probably a combination of having the staples removed, and being up on the foot yesterday more has all the nerves, firing, to continue in the boot, back down on her weight bearing time to about 30 minutes/hour, ice and elevate, that she would have varying degrees of swelling for 6-9 months and swelling brought pain, I told pt to take her pain medication as needed. I told pt that if she was instructed to take a shower tomorrow to get the foot what we called shower wet, not standing around in water, or bathtub bath, quick as possible complete bath, pat the area dry and cover with dry gauze, and other dressing recommended by her doctor. I told pt to call with concerns.

## 2017-07-07 NOTE — Telephone Encounter (Signed)
Pt states she has not gotten in to the shower yet, and asked is she just to wash quickly and her hair and rinse and get out. I told pt yes and pat the area dry. Pt states Dr. Amalia Hailey told her to put neosporin on the area and a big bandaid. I told pt she could use gauze, but do what Dr. Amalia Hailey directed. Pt states she was told to start wearing the athletic shoe. I told pt the easiest way to get use to the athletic shoe was to wear the surgical shoe/boot when out of the house whether she was familiar with the area or not, and wear the athletic shoe in the house as long as it was comfortable, and once uncomfortable go back in to the boot, and she could switch back and forth for comfort and our goal was to have her in the regular shoe for her entire work day. Pt states her foot has a crawly type feeling and I told her it could be the superficial nerve rejuvenating to their normal sensation. Pt states she is allergic to a lot of medications and is taking tylenol for pain.

## 2017-07-07 NOTE — Telephone Encounter (Signed)
Olivia Cruz, I got your message yesterday. I just have a couple more questions that I should have asked the doctor when I was in the other day but didn't. I don't go back to see him for another 4 weeks. I haven't taken that shower yet today like we talked about yesterday. I was wondering how long I would continue to do that? The only pain medication I have is tylenol because I was having a reaction to the other pain medications. If you would please call me back at [phone]. Thanks.

## 2017-07-09 NOTE — Progress Notes (Signed)
   Subjective:  Patient presents today status post cheilectomy of the right foot. DOS: 06/22/2017. She reports she is doing well overall. She denies any new complaints at this time. She is here for further evaluation and treatment.     Past Medical History:  Diagnosis Date  . Allergic rhinoconjunctivitis   . Anxiety   . Asthma   . Depression   . Diverticulitis   . Hypothyroid   . Insomnia   . Laryngopharyngeal reflux (LPR)   . Migraines   . Osteoarthritis   . STD (sexually transmitted disease)    HSV II      Objective/Physical Exam Skin incisions appear to be well coapted with sutures and staples intact. No sign of infectious process noted. No dehiscence. No active bleeding noted. Moderate edema noted to the surgical extremity.  Assessment: 1. s/p cheilectomy right foot. DOS: 06/22/2017   Plan of Care:  1. Patient was evaluated.  2. Staples removed. 3. May begin getting foot wet. 4. Continue wearing post op shoe x 1 week, then transition into sneakers. 5. Return to clinic in 4 weeks.   Edrick Kins, DPM [facility]  Dr. Edrick Kins, [address]                                        [place], [place] [address]                Office [phone]  Fax [phone]

## 2017-07-12 ENCOUNTER — Encounter: Payer: Self-pay | Admitting: Podiatry

## 2017-07-12 ENCOUNTER — Telehealth: Payer: Self-pay | Admitting: Podiatry

## 2017-07-12 ENCOUNTER — Other Ambulatory Visit: Payer: Self-pay

## 2017-07-12 ENCOUNTER — Ambulatory Visit (INDEPENDENT_AMBULATORY_CARE_PROVIDER_SITE_OTHER): Payer: PPO

## 2017-07-12 ENCOUNTER — Ambulatory Visit (INDEPENDENT_AMBULATORY_CARE_PROVIDER_SITE_OTHER): Payer: PPO | Admitting: Podiatry

## 2017-07-12 DIAGNOSIS — Z9889 Other specified postprocedural states: Secondary | ICD-10-CM

## 2017-07-12 DIAGNOSIS — M205X1 Other deformities of toe(s) (acquired), right foot: Secondary | ICD-10-CM

## 2017-07-12 DIAGNOSIS — R0602 Shortness of breath: Secondary | ICD-10-CM | POA: Insufficient documentation

## 2017-07-12 MED ORDER — NONFORMULARY OR COMPOUNDED ITEM: 0 refills | Status: DC

## 2017-07-12 MED ORDER — MELOXICAM 15 MG PO TABS: 15.0000 mg | ORAL_TABLET | Freq: Every day | ORAL | 1 refills | Status: AC

## 2017-07-12 MED ORDER — MELOXICAM 15 MG PO TABS: 15.0000 mg | ORAL_TABLET | Freq: Every day | ORAL | 1 refills | Status: DC

## 2017-07-12 NOTE — Telephone Encounter (Signed)
I saw Dr. Amalia Hailey today. He said he was going to prescribe Mobic to my pharmacy. [facility] [address] [facility] [place]. They do not have anything from him yet. I wanted to make sure it got sent to the correct pharmacy. They are going to send a fax request and they wanted me to follow up so that's what I'm doing. You can call me back at [phone]. Thank you.

## 2017-07-12 NOTE — Telephone Encounter (Signed)
I re-sent RX to correct pharmacy.

## 2017-07-17 ENCOUNTER — Telehealth: Payer: Self-pay | Admitting: *Deleted

## 2017-07-17 NOTE — Telephone Encounter (Signed)
Refill request refill meloxicam. Dr. Amalia Hailey states refill +1, pt needs an appt prior to future refills.

## 2017-07-18 NOTE — Progress Notes (Signed)
   Subjective:  Patient presents today status post cheilectomy of the right foot. DOS: 06/22/2017. She reports some pain around her incision site. She reports a small blister in the area. Bending her toe causes significant pain. She states she heard a popping sound in the foot yesterday. She has been taking Dilaudid for pain. She is here for further evaluation and treatment.     Past Medical History:  Diagnosis Date  . Allergic rhinoconjunctivitis   . Anxiety   . Asthma   . Depression   . Diverticulitis   . Hypothyroid   . Insomnia   . Laryngopharyngeal reflux (LPR)   . Migraines   . Osteoarthritis   . STD (sexually transmitted disease)    HSV II      Objective/Physical Exam Skin incisions appear to be well coapted. No sign of infectious process noted. No dehiscence. No active bleeding noted. Moderate edema noted to the surgical extremity.  Radiographic Exam:  Orthopedic hardware and osteotomies sites appear to be stable with routine healing.  Assessment: 1. s/p cheilectomy right foot. DOS: 06/22/2017   Plan of Care:  1. Patient was evaluated. X-Rays reviewed. 2. Prescription for Meloxicam 15 mg provided for patient. 3. Compression anklet dispensed.  4. Prescription for neuropathy pain cream to be dispensed by [facility].  5. Return to clinic in 4 weeks.   Edrick Kins, DPM [facility]  Dr. Edrick Kins, [address]                                        [place], [place] [address]                Office [phone]  Fax [phone]

## 2017-07-19 DIAGNOSIS — K5904 Chronic idiopathic constipation: Secondary | ICD-10-CM | POA: Diagnosis not present

## 2017-07-19 DIAGNOSIS — R1032 Left lower quadrant pain: Secondary | ICD-10-CM | POA: Diagnosis not present

## 2017-07-19 DIAGNOSIS — R203 Hyperesthesia: Secondary | ICD-10-CM | POA: Diagnosis not present

## 2017-07-24 ENCOUNTER — Telehealth: Payer: Self-pay | Admitting: Allergy and Immunology

## 2017-07-24 ENCOUNTER — Other Ambulatory Visit: Payer: Self-pay | Admitting: *Deleted

## 2017-07-24 MED ORDER — RANITIDINE HCL 300 MG PO TABS: ORAL_TABLET | ORAL | 1 refills | Status: DC

## 2017-07-24 NOTE — Telephone Encounter (Signed)
RX for Ranitidine sent

## 2017-07-24 NOTE — Telephone Encounter (Signed)
Patient would [facility] [address] 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

## 2017-07-27 DIAGNOSIS — K581 Irritable bowel syndrome with constipation: Secondary | ICD-10-CM | POA: Diagnosis not present

## 2017-08-01 DIAGNOSIS — R109 Unspecified abdominal pain: Secondary | ICD-10-CM | POA: Diagnosis not present

## 2017-08-01 DIAGNOSIS — K529 Noninfective gastroenteritis and colitis, unspecified: Secondary | ICD-10-CM | POA: Diagnosis not present

## 2017-08-01 DIAGNOSIS — K5792 Diverticulitis of intestine, part unspecified, without perforation or abscess without bleeding: Secondary | ICD-10-CM | POA: Diagnosis not present

## 2017-08-02 ENCOUNTER — Encounter: Payer: Self-pay | Admitting: Podiatry

## 2017-08-02 ENCOUNTER — Ambulatory Visit (INDEPENDENT_AMBULATORY_CARE_PROVIDER_SITE_OTHER): Payer: PPO | Admitting: Podiatry

## 2017-08-02 DIAGNOSIS — Z9889 Other specified postprocedural states: Secondary | ICD-10-CM

## 2017-08-06 NOTE — Progress Notes (Signed)
   Subjective:  Patient presents today status post cheilectomy of the right foot. DOS: 06/22/2017.  She reports tingling at the base of the right great toe.  She reports improved range of motion.  She has been taking meloxicam, wearing compression anklet and using pain cream dispensed by [facility].  Patient is here for further evaluation and treatment.   Past Medical History:  Diagnosis Date  . Allergic rhinoconjunctivitis   . Anxiety   . Asthma   . Depression   . Diverticulitis   . Hypothyroid   . Insomnia   . Laryngopharyngeal reflux (LPR)   . Migraines   . Osteoarthritis   . STD (sexually transmitted disease)    HSV II      Objective/Physical Exam Skin incisions appear to be well coapted. No sign of infectious process noted. No dehiscence. No active bleeding noted. Moderate edema noted to the surgical extremity.  Assessment: 1. s/p cheilectomy right foot. DOS: 06/22/2017   Plan of Care:  1.  Patient was evaluated.  2.  Return to full activity with no restrictions. 3.  Note for work provided. 4.  Return to clinic as needed.   Edrick Kins, DPM [facility]  Dr. Edrick Kins, [address]                                        [place], [place] [address]                Office [phone]  Fax [phone]

## 2017-08-09 DIAGNOSIS — K5732 Diverticulitis of large intestine without perforation or abscess without bleeding: Secondary | ICD-10-CM | POA: Diagnosis not present

## 2017-08-23 DIAGNOSIS — K5730 Diverticulosis of large intestine without perforation or abscess without bleeding: Secondary | ICD-10-CM | POA: Diagnosis not present

## 2017-08-23 DIAGNOSIS — J00 Acute nasopharyngitis [common cold]: Secondary | ICD-10-CM | POA: Diagnosis not present

## 2017-09-18 ENCOUNTER — Encounter: Payer: Self-pay | Admitting: Allergy and Immunology

## 2017-09-18 ENCOUNTER — Ambulatory Visit (INDEPENDENT_AMBULATORY_CARE_PROVIDER_SITE_OTHER): Payer: Medicare HMO | Admitting: Allergy and Immunology

## 2017-09-18 VITALS — BP 132/80 | HR 80 | Resp 18

## 2017-09-18 DIAGNOSIS — J4521 Mild intermittent asthma with (acute) exacerbation: Secondary | ICD-10-CM

## 2017-09-18 DIAGNOSIS — J3089 Other allergic rhinitis: Secondary | ICD-10-CM | POA: Diagnosis not present

## 2017-09-18 DIAGNOSIS — K219 Gastro-esophageal reflux disease without esophagitis: Secondary | ICD-10-CM | POA: Diagnosis not present

## 2017-09-18 NOTE — Progress Notes (Signed)
Follow-up Note  Referring Provider: Rochel Brome, MD Primary Provider: Rochel Brome, MD Date of Office Visit: 09/18/2017  Subjective:   Olivia Cruz (DOB: 16-Jun-1951) is a 67 y.o. female who returns to the [facility] on 09/18/2017 in re-evaluation of the following:  HPI: Olivia Cruz presents to this clinic in evaluation of asthma and allergic rhinitis and LPR.  Her last visit to this clinic was 19 June 2017.  She has really done very well with her respiratory tract and has not required a systemic steroid or an antibiotic or the need for short acting bronchodilator.  She can exercise without any problem.  She consistently uses anti-inflammatory agents for upper airway and aggressive therapy directed against reflux.  However, while on a cruise last week, she developed problems with unrelenting cough without any significant upper airway symptoms or throat symptoms or reflux symptoms and without any fever or ugly nasal discharge or ugly sputum production or chest pain.  She is slightly better today.  She did receive the flu vaccine.  Allergies as of 09/18/2017      Reactions   Codeine Anaphylaxis, Nausea Only   Ambien [zolpidem Tartrate] Other (See Comments)   Memory loss per patient   Augmentin [amoxicillin-pot Clavulanate] Nausea And Vomiting   Dilaudid [hydromorphone]    Oxycodone    Sulfa Antibiotics       Medication List      ALLEGRA ALLERGY 180 MG tablet Generic drug:  fexofenadine Take 180 mg by mouth as needed for allergies or rhinitis.   CALCIUM + D PO Take 1,200 mg by mouth daily.   gabapentin 100 MG capsule Commonly known as:  NEURONTIN Take 100 mg by mouth 2 (two) times daily.   HAIR SKIN NAILS PO Take by mouth.   levothyroxine 50 MCG tablet Commonly known as:  SYNTHROID, LEVOTHROID TK 1 T PO D   multivitamin capsule Take 1 capsule by mouth daily.   nitrofurantoin (macrocrystal-monohydrate) 100 MG capsule Commonly known as:   MACROBID Take 1 capsule (100 mg total) by mouth as needed.   NONFORMULARY OR COMPOUNDED ITEM [facility]  Peripheral Neuropathy Cream- Bupivacaine 1%, Doxepin 3%, Gabapentin 6%, Pentoxifylline 3%, Topiramate 1% Apply 1-2 grams to affected area 3-4 times daily Qty. 120 gm 3 refills   omeprazole 40 MG capsule Commonly known as:  PRILOSEC Take one capsule by mouth every morning as directed.   PROAIR HFA 108 (90 Base) MCG/ACT inhaler Generic drug:  albuterol Inhale 2 puffs into the lungs every 4 (four) hours as needed for wheezing or shortness of breath.   promethazine-dextromethorphan 6.25-15 MG/5ML syrup Commonly known as:  PROMETHAZINE-DM TAKE 1 TEASPOONFUL BY MOUTH TWICE DAILY   ranitidine 300 MG tablet Commonly known as:  ZANTAC Take one tablet every evening as directed   RHINOCORT ALLERGY 32 MCG/ACT nasal spray Generic drug:  budesonide Place 1 spray into both nostrils daily. Reported on 02/04/2016   valACYclovir 500 MG tablet Commonly known as:  VALTREX Take 1 tablet (500 mg total) by mouth daily.       Past Medical History:  Diagnosis Date  . Allergic rhinoconjunctivitis   . Anxiety   . Asthma   . Depression   . Diverticulitis   . Hypothyroid   . Insomnia   . Laryngopharyngeal reflux (LPR)   . Migraines   . Osteoarthritis   . STD (sexually transmitted disease)    HSV II    Past Surgical History:  Procedure Laterality Date  .  BREAST SURGERY Left    times 2  . FOOT SURGERY Right    Removed bone spur  . GANGLION CYST EXCISION Left    foot  . HEMORRHOID SURGERY      Review of systems negative except as noted in HPI / PMHx or noted below:  Review of Systems  Constitutional: Negative.   HENT: Negative.   Eyes: Negative.   Respiratory: Negative.   Cardiovascular: Negative.   Gastrointestinal: Negative.   Genitourinary: Negative.   Musculoskeletal: Negative.   Skin: Negative.   Neurological: Negative.   Endo/Heme/Allergies: Negative.     Psychiatric/Behavioral: Negative.      Objective:   Vitals:   09/18/17 1346  BP: 132/80  Pulse: 80  Resp: 18          Physical Exam  Constitutional: She is well-developed, well-nourished, and in no distress.  HENT:  Head: Normocephalic.  Right Ear: Tympanic membrane, external ear and ear canal normal.  Left Ear: Tympanic membrane, external ear and ear canal normal.  Nose: Nose normal. No mucosal edema or rhinorrhea.  Mouth/Throat: Uvula is midline, oropharynx is clear and moist and mucous membranes are normal. No oropharyngeal exudate.  Eyes: Conjunctivae are normal.  Neck: Trachea normal. No tracheal tenderness present. No tracheal deviation present. No thyromegaly present.  Cardiovascular: Normal rate, regular rhythm, S1 normal, S2 normal and normal heart sounds.  No murmur heard. Pulmonary/Chest: Breath sounds normal. No stridor. No respiratory distress. She has no wheezes. She has no rales.  Musculoskeletal: She exhibits no edema.  Lymphadenopathy:       Head (right side): No tonsillar adenopathy present.       Head (left side): No tonsillar adenopathy present.    She has no cervical adenopathy.  Neurological: She is alert. Gait normal.  Skin: No rash noted. She is not diaphoretic. No erythema. Nails show no clubbing.  Psychiatric: Mood and affect normal.    Diagnostics:    Spirometry was performed and demonstrated an FEV1 of 2.10 at 98 % of predicted.  The patient had an Asthma Control Test with the following results: ACT Total Score: 13.    Assessment and Plan:   1. Asthma, not well controlled, mild intermittent, with acute exacerbation   2. Other allergic rhinitis   3. LPRD (laryngopharyngeal reflux disease)      1. Continue to Treat and prevent inflammation:   A. OTC Rhinocort one spray each nostril 1-7 times per week  2. Continue to Treat and prevent reflux:   A. Continue to remain away from caffeine and chocolate  B. omeprazole 40 mg in AM  C.  ranitidine 300 mg in evening  3. If needed:   A. ProAir HFA 2 puffs every 4-6 hours  B. OTC Mucinex DM 2 tablets twice a day  C. over-the-counter antihistamine  4. Prednisone 10mg  two tablets one time per day for 5 days only  5. Return to clinic in 6 months or earlier if problem  Olivia Cruz appears to have developed some type of respiratory tract inflammatory condition most likely secondary to an infectious disease.  Because she is somewhat better today we will hold off on significant evaluation and treatment and just have her use a systemic anti-inflammatory agent for the next several days.  Overall she has really done well on her plan of therapy directed against respiratory tract inflammation and reflux and we will continue this plan and see her back in this clinic in 6 months or earlier if there is a problem.  Allena Katz, MD [facility] [place]

## 2017-09-18 NOTE — Patient Instructions (Addendum)
   1. Continue to Treat and prevent inflammation:   A. OTC Rhinocort one spray each nostril 1-7 times per week  2. Continue to Treat and prevent reflux:   A. Continue to remain away from caffeine and chocolate  B. omeprazole 40 mg in AM  C. ranitidine 300 mg in evening  3. If needed:   A. ProAir HFA 2 puffs every 4-6 hours  B. OTC Mucinex DM 2 tablets twice a day  C. over-the-counter antihistamine  4. Prednisone 10mg  two tablets one time per day for 5 days only  5. Return to clinic in 6 months or earlier if problem

## 2017-09-19 ENCOUNTER — Encounter: Payer: Self-pay | Admitting: Allergy and Immunology

## 2017-09-19 DIAGNOSIS — L570 Actinic keratosis: Secondary | ICD-10-CM | POA: Diagnosis not present

## 2017-09-19 DIAGNOSIS — D225 Melanocytic nevi of trunk: Secondary | ICD-10-CM | POA: Diagnosis not present

## 2017-09-19 DIAGNOSIS — L728 Other follicular cysts of the skin and subcutaneous tissue: Secondary | ICD-10-CM | POA: Diagnosis not present

## 2017-09-19 DIAGNOSIS — D2239 Melanocytic nevi of other parts of face: Secondary | ICD-10-CM | POA: Diagnosis not present

## 2017-09-19 DIAGNOSIS — L814 Other melanin hyperpigmentation: Secondary | ICD-10-CM | POA: Diagnosis not present

## 2017-09-21 ENCOUNTER — Telehealth: Payer: Self-pay | Admitting: Allergy and Immunology

## 2017-09-21 MED ORDER — AZITHROMYCIN 500 MG PO TABS: 500.0000 mg | ORAL_TABLET | Freq: Every day | ORAL | 0 refills | Status: DC

## 2017-09-21 NOTE — Telephone Encounter (Signed)
Please inform patient that we will use an antibiotic, azithromycin 500mg  one time per day for three days, and she can add Tussionex 2 mls - 5 mls every 12 hours if needed. Tussionex has a narcotic so use lowest dose needed to hopefully provide cough suppression without GI upset or CNS side effect (20mls - no refill)

## 2017-09-21 NOTE — Telephone Encounter (Signed)
Patient called back and I advised her Dr Neldon Mc instructions.  She advised to hold off on Tussionex due to possible reaction to hydrocodone years ago and if she decides she wants Rx will contact office.  [facility] ahead [facility]

## 2017-09-21 NOTE — Telephone Encounter (Signed)
LM for patient to contact office.

## 2017-09-21 NOTE — Telephone Encounter (Signed)
Olivia Cruz called in and stated she is still coughing and her co-workers told her that her cough sounded really bad and she feels the Prednisone is not working and would like to know what to do?  Olivia Cruz stated she didn't want to get stuck over the weekend having to go to urgent care.  Please advise.

## 2017-09-22 DIAGNOSIS — J028 Acute pharyngitis due to other specified organisms: Secondary | ICD-10-CM | POA: Diagnosis not present

## 2017-09-22 DIAGNOSIS — J101 Influenza due to other identified influenza virus with other respiratory manifestations: Secondary | ICD-10-CM | POA: Diagnosis not present

## 2017-09-25 DIAGNOSIS — M545 Low back pain: Secondary | ICD-10-CM | POA: Diagnosis not present

## 2017-09-25 DIAGNOSIS — J028 Acute pharyngitis due to other specified organisms: Secondary | ICD-10-CM | POA: Diagnosis not present

## 2017-09-25 NOTE — Progress Notes (Signed)
DOS 06/22/17 Cheilectomy bone spur resection Rt

## 2017-09-27 ENCOUNTER — Other Ambulatory Visit: Payer: Self-pay | Admitting: *Deleted

## 2017-09-27 MED ORDER — HYDROCOD POLST-CPM POLST ER 10-8 MG/5ML PO SUER: ORAL | 0 refills | Status: DC

## 2017-09-29 DIAGNOSIS — J0100 Acute maxillary sinusitis, unspecified: Secondary | ICD-10-CM | POA: Diagnosis not present

## 2017-09-29 DIAGNOSIS — J209 Acute bronchitis, unspecified: Secondary | ICD-10-CM | POA: Diagnosis not present

## 2017-10-25 ENCOUNTER — Encounter: Payer: Self-pay | Admitting: Gastroenterology

## 2017-11-01 DIAGNOSIS — Z0001 Encounter for general adult medical examination with abnormal findings: Secondary | ICD-10-CM | POA: Diagnosis not present

## 2017-11-01 DIAGNOSIS — R69 Illness, unspecified: Secondary | ICD-10-CM | POA: Diagnosis not present

## 2017-11-01 DIAGNOSIS — Z6827 Body mass index (BMI) 27.0-27.9, adult: Secondary | ICD-10-CM | POA: Diagnosis not present

## 2017-11-01 DIAGNOSIS — N958 Other specified menopausal and perimenopausal disorders: Secondary | ICD-10-CM | POA: Diagnosis not present

## 2017-11-01 DIAGNOSIS — R5383 Other fatigue: Secondary | ICD-10-CM | POA: Diagnosis not present

## 2017-11-01 DIAGNOSIS — Z1231 Encounter for screening mammogram for malignant neoplasm of breast: Secondary | ICD-10-CM | POA: Diagnosis not present

## 2017-11-09 DIAGNOSIS — H2513 Age-related nuclear cataract, bilateral: Secondary | ICD-10-CM | POA: Diagnosis not present

## 2017-11-10 DIAGNOSIS — Z809 Family history of malignant neoplasm, unspecified: Secondary | ICD-10-CM | POA: Diagnosis not present

## 2017-11-10 DIAGNOSIS — F4321 Adjustment disorder with depressed mood: Secondary | ICD-10-CM | POA: Diagnosis not present

## 2017-11-10 DIAGNOSIS — G4700 Insomnia, unspecified: Secondary | ICD-10-CM | POA: Diagnosis not present

## 2017-11-10 DIAGNOSIS — Z823 Family history of stroke: Secondary | ICD-10-CM | POA: Diagnosis not present

## 2017-11-10 DIAGNOSIS — R030 Elevated blood-pressure reading, without diagnosis of hypertension: Secondary | ICD-10-CM | POA: Diagnosis not present

## 2017-11-10 DIAGNOSIS — E039 Hypothyroidism, unspecified: Secondary | ICD-10-CM | POA: Diagnosis not present

## 2017-11-10 DIAGNOSIS — J45909 Unspecified asthma, uncomplicated: Secondary | ICD-10-CM | POA: Diagnosis not present

## 2017-11-10 DIAGNOSIS — R69 Illness, unspecified: Secondary | ICD-10-CM | POA: Diagnosis not present

## 2017-11-10 DIAGNOSIS — R32 Unspecified urinary incontinence: Secondary | ICD-10-CM | POA: Diagnosis not present

## 2017-11-10 DIAGNOSIS — K219 Gastro-esophageal reflux disease without esophagitis: Secondary | ICD-10-CM | POA: Diagnosis not present

## 2017-11-14 ENCOUNTER — Encounter: Payer: Self-pay | Admitting: Gastroenterology

## 2017-11-14 ENCOUNTER — Ambulatory Visit (INDEPENDENT_AMBULATORY_CARE_PROVIDER_SITE_OTHER): Payer: Medicare HMO | Admitting: Gastroenterology

## 2017-11-14 VITALS — BP 110/68 | HR 72 | Ht 61.0 in | Wt 145.2 lb

## 2017-11-14 DIAGNOSIS — Z1211 Encounter for screening for malignant neoplasm of colon: Secondary | ICD-10-CM | POA: Diagnosis not present

## 2017-11-14 DIAGNOSIS — R110 Nausea: Secondary | ICD-10-CM | POA: Diagnosis not present

## 2017-11-14 DIAGNOSIS — Z8719 Personal history of other diseases of the digestive system: Secondary | ICD-10-CM

## 2017-11-14 MED ORDER — OMEPRAZOLE 40 MG PO CPDR: 40.0000 mg | DELAYED_RELEASE_CAPSULE | Freq: Two times a day (BID) | ORAL | 0 refills | Status: DC

## 2017-11-14 MED ORDER — SOD PICOSULFATE-MAG OX-CIT ACD 10-3.5-12 MG-GM -GM/160ML PO SOLN: 1.0000 | Freq: Once | ORAL | 0 refills | Status: AC

## 2017-11-14 NOTE — Progress Notes (Signed)
Olivia Cruz     IMPRESSION and PLAN:    #1.  History of diverticulitis, here for colorectal cancer screening. (Previous colonoscopy 04/17/2003-negative except for mild sigmoid diverticulosis) -Would proceed with colonoscopy with a 2-day preparation for colorectal cancer screening continue high-fiber diet. -Continue Metamucil.     #2.  Hoarseness -Increase omeprazole 40 mg p.o. twice daily for 2 weeks and then can decrease omeprazole to once a day.  Continue Zantac 150 milligrams p.o. nightly. -If still with problems, recommend reevaluation by Dr. Gaylyn Cheers ENT. -I have discussed above in detail with the Olivia Cruz and Olivia Cruz's husband.      HPI:    Chief Complaint:    Olivia Cruz is a 67 year old very pleasant white female who was diagnosed as having diverticulitis by means of CT scan on 08/01/2017 when she presented to the emergency room with left lower quadrant abdominal pain.  The CT scan did not show any abscesses.  She was treated with Cipro and Flagyl with good results.  She has been sent to the GI clinic for further evaluation by means of a colonoscopy.  At the present time, she denies having any significant GI complaints no diarrhea or constipation.  No melena or hematochezia.  She does complain of chronic hoarseness for which she has been followed by Dr. Gaylyn Cheers from ENT. She will also occasionally have some heartburn.  She had undergone EGD on 03/23/2016 which did not show any evidence of esophagitis.  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  She denies having any dysphagia or odynophagia. Sh also has been followed by Dr. Neldon Mc (allergy specialist)    Review of systems:       Past Medical History:  Diagnosis Date  . Allergic rhinoconjunctivitis   . Anxiety   . Asthma   . Depression   . Diverticulitis   . Hypothyroid   . Insomnia   . Laryngopharyngeal reflux (LPR)   . Migraines   . Osteoarthritis   . STD (sexually transmitted  disease)    HSV II    Current Outpatient Medications  Medication Sig Dispense Refill  . albuterol (PROAIR HFA) 108 (90 Base) MCG/ACT inhaler Inhale 2 puffs into the lungs every 4 (four) hours as needed for wheezing or shortness of breath.    . budesonide (RHINOCORT ALLERGY) 32 MCG/ACT nasal spray Place 1 spray into both nostrils daily. Reported on 02/04/2016    . Calcium Carbonate-Vitamin D (CALCIUM + D PO) Take 1,200 mg by mouth daily.    . chlorpheniramine-HYDROcodone (TUSSIONEX PENNKINETIC ER) 10-8 MG/5ML SUER Take one half to one teaspoon every 12 hours if needed for cough 60 mL 0  . levothyroxine (SYNTHROID, LEVOTHROID) 50 MCG tablet TK 1 T PO D  1  . Multiple Vitamin (MULTIVITAMIN) capsule Take 1 capsule by mouth daily.    . Multiple Vitamins-Minerals (HAIR SKIN NAILS PO) Take by mouth.    . nitrofurantoin, macrocrystal-monohydrate, (MACROBID) 100 MG capsule Take 1 capsule (100 mg total) by mouth as needed. 30 capsule 2  . NONFORMULARY OR COMPOUNDED ITEM [facility]  Peripheral Neuropathy Cream- Bupivacaine 1%, Doxepin 3%, Gabapentin 6%, Pentoxifylline 3%, Topiramate 1% Apply 1-2 grams to affected area 3-4 times daily Qty. 120 gm 3 refills 1 each 0  . omeprazole (PRILOSEC) 40 MG capsule Take one capsule by mouth every morning as directed. 90 capsule 1  . ranitidine (ZANTAC) 300 MG tablet Take one tablet every evening as directed 90 tablet 1  . valACYclovir (VALTREX)  500 MG tablet Take 1 tablet (500 mg total) by mouth daily. 30 tablet 2   No current facility-administered medications for this visit.     Olivia Cruz's surgical history, family medical history, social history and allergies were all reviewed in Epic    Physical Exam:     BP 110/68   Pulse 72   Ht 5\' 1"  (1.549 m)   Wt 145 lb 4 oz (65.9 kg)   LMP 08/22/2005   SpO2 94%   BMI 27.44 kg/m   GENERAL:  NAD PSYCH: :Pleasant, cooperative, normal affect EENT:  conjunctiva pink, mucous membranes moist, neck supple  without masses CARDIAC:  RRR, No murmur heard, + peripheral edema PULM: Normal respiratory effort, lungs CTA bilaterally, no wheezing ABDOMEN:  Nondistended, soft, nontender. No obvious masses, no hepatomegaly,  normal bowel sounds SKIN:  turgor, no lesions seen Musculoskeletal:  Normal muscle tone, normal strength NEURO: Alert and oriented x 3, no focal neurologic deficits.  Cc Dr Tobie Poet   11/14/2017, 2:53 PM

## 2017-11-14 NOTE — Patient Instructions (Signed)
If you are age 68 or older, your body mass index should be between 23-30. Your Body mass index is 27.44 kg/m. If this is out of the aforementioned range listed, please consider follow up with your Primary Care Provider.  If you are age 8 or younger, your body mass index should be between 19-25. Your Body mass index is 27.44 kg/m. If this is out of the aformentioned range listed, please consider follow up with your Primary Care Provider.   We have sent the following medications to your pharmacy for you to pick up at your convenience: Omeprazole 40mg  twice a day for 14 days.  Clenpiq  You have been scheduled for a colonoscopy. Please follow written instructions given to you at your visit today.  Please pick up your prep supplies at the pharmacy within the next 1-3 days. If you use inhalers (even only as needed), please bring them with you on the day of your procedure. Your physician has requested that you go to www.startemmi.com and enter the access code given to you at your visit today. This web site gives a general overview about your procedure. However, you should still follow specific instructions given to you by our office regarding your preparation for the procedure.  Thank you,  Dr. Jackquline Denmark

## 2017-11-16 DIAGNOSIS — H524 Presbyopia: Secondary | ICD-10-CM | POA: Diagnosis not present

## 2017-11-18 DIAGNOSIS — Z0100 Encounter for examination of eyes and vision without abnormal findings: Secondary | ICD-10-CM | POA: Diagnosis not present

## 2017-11-24 ENCOUNTER — Encounter: Payer: Self-pay | Admitting: Gastroenterology

## 2017-12-01 DIAGNOSIS — M85851 Other specified disorders of bone density and structure, right thigh: Secondary | ICD-10-CM | POA: Diagnosis not present

## 2017-12-01 DIAGNOSIS — Z1231 Encounter for screening mammogram for malignant neoplasm of breast: Secondary | ICD-10-CM | POA: Diagnosis not present

## 2017-12-01 DIAGNOSIS — N959 Unspecified menopausal and perimenopausal disorder: Secondary | ICD-10-CM | POA: Diagnosis not present

## 2017-12-05 ENCOUNTER — Ambulatory Visit: Payer: PPO | Admitting: Obstetrics & Gynecology

## 2017-12-07 ENCOUNTER — Ambulatory Visit (AMBULATORY_SURGERY_CENTER): Payer: Medicare HMO | Admitting: Gastroenterology

## 2017-12-07 ENCOUNTER — Other Ambulatory Visit: Payer: Self-pay

## 2017-12-07 ENCOUNTER — Encounter: Payer: Self-pay | Admitting: Gastroenterology

## 2017-12-07 VITALS — BP 124/74 | HR 62 | Temp 98.2°F | Resp 11 | Ht 61.0 in | Wt 145.0 lb

## 2017-12-07 DIAGNOSIS — Z8719 Personal history of other diseases of the digestive system: Secondary | ICD-10-CM

## 2017-12-07 DIAGNOSIS — Z1211 Encounter for screening for malignant neoplasm of colon: Secondary | ICD-10-CM | POA: Diagnosis not present

## 2017-12-07 MED ORDER — SODIUM CHLORIDE 0.9 % IV SOLN: 500.0000 mL | Freq: Once | INTRAVENOUS | Status: DC

## 2017-12-07 NOTE — Progress Notes (Signed)
A and O x3. Report to RN. Tolerated MAC anesthesia well.

## 2017-12-07 NOTE — Patient Instructions (Signed)
Information on hemorrhoids given.  YOU HAD AN ENDOSCOPIC PROCEDURE TODAY AT [facility]:   Refer to the procedure report that was given to you for any specific questions about what was found during the examination.  If the procedure report does not answer your questions, please call your gastroenterologist to clarify.  If you requested that your care partner not be given the details of your procedure findings, then the procedure report has been included in a sealed envelope for you to review at your convenience later.  YOU SHOULD EXPECT: Some feelings of bloating in the abdomen. Passage of more gas than usual.  Walking can help get rid of the air that was put into your GI tract during the procedure and reduce the bloating. If you had a lower endoscopy (such as a colonoscopy or flexible sigmoidoscopy) you may notice spotting of blood in your stool or on the toilet paper. If you underwent a bowel prep for your procedure, you may not have a normal bowel movement for a few days.  Please Note:  You might notice some irritation and congestion in your nose or some drainage.  This is from the oxygen used during your procedure.  There is no need for concern and it should clear up in a day or so.  SYMPTOMS TO REPORT IMMEDIATELY:   Following lower endoscopy (colonoscopy or flexible sigmoidoscopy):  Excessive amounts of blood in the stool  Significant tenderness or worsening of abdominal pains  Swelling of the abdomen that is new, acute  Fever of 100F or higher   For urgent or emergent issues, a gastroenterologist can be reached at any hour by calling [phone].   DIET:  We do recommend a small meal at first, but then you may proceed to your regular diet.  Drink plenty of fluids but you should avoid alcoholic beverages for 24 hours.  ACTIVITY:  You should plan to take it easy for the rest of today and you should NOT DRIVE or use heavy machinery until tomorrow (because of the  sedation medicines used during the test).    FOLLOW UP: Our staff will call the number listed on your records the next business day following your procedure to check on you and address any questions or concerns that you may have regarding the information given to you following your procedure. If we do not reach you, we will leave a message.  However, if you are feeling well and you are not experiencing any problems, there is no need to return our call.  We will assume that you have returned to your regular daily activities without incident.  If any biopsies were taken you will be contacted by phone or by letter within the next 1-3 weeks.  Please call us at [phone] if you have not heard about the biopsies in 3 weeks.    SIGNATURES/CONFIDENTIALITY: You and/or your care partner have signed paperwork which will be entered into your electronic medical record.  These signatures attest to the fact that that the information above on your After Visit Summary has been reviewed and is understood.  Full responsibility of the confidentiality of this discharge information lies with you and/or your care-partner.

## 2017-12-07 NOTE — Op Note (Signed)
[place] Patient Name: Olivia Cruz Procedure Date: 12/07/2017 2:00 PM MRN: [id number] Endoscopist: Jackquline Denmark MD, MD Age: 67 Referring MD:  Date of Birth: 09/27/1950 Gender: Female Account #: [id number] Procedure:                Colonoscopy Indications:              Screening for colorectal malignant neoplasm.                            history of diverticulitis. Medicines:                Monitored Anesthesia Care Procedure:                Pre-Anesthesia Assessment:                           - Prior to the procedure, a History and Physical                            was performed, and patient medications and                            allergies were reviewed. The patient is competent.                            The risks and benefits of the procedure and the                            sedation options and risks were discussed with the                            patient. All questions were answered and informed                            consent was obtained. Patient identification and                            proposed procedure were verified by the physician                            in the procedure room. Mental Status Examination:                            alert and oriented. Prophylactic Antibiotics: The                            patient does not require prophylactic antibiotics.                            Prior Anticoagulants: The patient has taken no                            previous anticoagulant or antiplatelet agents. ASA  Grade Assessment: II - A patient with mild systemic                            disease. After reviewing the risks and benefits,                            the patient was deemed in satisfactory condition to                            undergo the procedure. The anesthesia plan was to                            use monitored anesthesia care (MAC). Immediately                            prior to  administration of medications, the patient                            was re-assessed for adequacy to receive sedatives.                            The heart rate, respiratory rate, oxygen                            saturations, blood pressure, adequacy of pulmonary                            ventilation, and response to care were monitored                            throughout the procedure. The physical status of                            the patient was re-assessed after the procedure.                           After obtaining informed consent, the colonoscope                            was passed under direct vision. Throughout the                            procedure, the patient's blood pressure, pulse, and                            oxygen saturations were monitored continuously. The                            Colonoscope was introduced through the anus and                            advanced to the 2 cm into the ileum. The  colonoscopy was performed without difficulty. The                            patient tolerated the procedure well. The quality                            of the bowel preparation was good. Scope In: 2:15:40 PM Scope Out: 2:27:53 PM Scope Withdrawal Time: 0 hours 6 minutes 47 seconds  Total Procedure Duration: 0 hours 12 minutes 13 seconds  Findings:                 Hemorrhoids were found on perianal exam.                           Multiple medium-mouthed diverticula were found in                            the sigmoid colon. It did give sigmoid "Swiss                            cheese appearance". There was some luminal                            narrowing consistent with muscular hypertrophy. Few                            small diverticula were visualized in the transverse                            colon and descending colon.                           The terminal ileum appeared normal. Complications:            No immediate  complications. Estimated Blood Loss:     Estimated blood loss: none. Impression:               - Moderate to severe sigmoid diverticulosis.                           - Small internal hemorrhoids                           - Otherwise normal colonoscopy to terminal ileum. Recommendation:           - Patient has a contact number available for                            emergencies. The signs and symptoms of potential                            delayed complications were discussed with the                            patient. Return to normal activities tomorrow.  Written discharge instructions were provided to the                            patient.                           - High fiber diet.                           - Continue present medications.                           - Start fiber supplements and formal Benefiber 1                            tablespoon by mouth every day. Jackquline Denmark MD, MD 12/07/2017 2:37:17 PM This report has been signed electronically.

## 2017-12-07 NOTE — Progress Notes (Signed)
Pt's states no medical or surgical changes since previsit or office visit. 

## 2017-12-11 ENCOUNTER — Telehealth: Payer: Self-pay

## 2017-12-11 NOTE — Telephone Encounter (Signed)
  Follow up Call-  Call back number 12/07/2017  Post procedure Call Back phone  # [phone]  Permission to leave phone message Yes  Some recent data might be hidden     Patient questions:  Do you have a fever, pain , or abdominal swelling? No. Pain Score  4 *  Have you tolerated food without any problems? Yes.    Have you been able to return to your normal activities? Yes.    Do you have any questions about your discharge instructions: Diet   No. Medications  No. Follow up visit  No.  Do you have questions or concerns about your Care? Yes.    Actions: * If pain score is 4 or above: No action needed, pain <4.  Pt reported she ate a large meal after her procedure and she had a bm on her way back to [place].  She said she should have started with a light meal.  Pt also reports abdominal cramping still rating it 4 out of 10 on the pain scale.  Cramping in intermittent.  Pt started back on metamucil.  I asked pt to please call us back if cramping does not resolve with next 2 days.  Pt said she would. maw

## 2018-01-01 ENCOUNTER — Encounter: Payer: Self-pay | Admitting: Gastroenterology

## 2018-01-02 ENCOUNTER — Encounter: Payer: Self-pay | Admitting: Gastroenterology

## 2018-01-02 ENCOUNTER — Ambulatory Visit (INDEPENDENT_AMBULATORY_CARE_PROVIDER_SITE_OTHER): Payer: Medicare HMO | Admitting: Gastroenterology

## 2018-01-02 VITALS — BP 118/66 | HR 64 | Ht 61.0 in | Wt 143.0 lb

## 2018-01-02 DIAGNOSIS — Z8719 Personal history of other diseases of the digestive system: Secondary | ICD-10-CM | POA: Diagnosis not present

## 2018-01-02 DIAGNOSIS — R490 Dysphonia: Secondary | ICD-10-CM

## 2018-01-02 MED ORDER — DICYCLOMINE HCL 10 MG PO CAPS: 10.0000 mg | ORAL_CAPSULE | Freq: Two times a day (BID) | ORAL | 1 refills | Status: DC

## 2018-01-02 MED ORDER — PANTOPRAZOLE SODIUM 40 MG PO TBEC: 40.0000 mg | DELAYED_RELEASE_TABLET | Freq: Every day | ORAL | 6 refills | Status: DC

## 2018-01-02 NOTE — Progress Notes (Signed)
IMPRESSION and PLAN:    #1.  History of diverticulitis, here for colorectal cancer screening. (colon 12/07/2017- mod sig diverticulosis, colonoscopy 04/17/2003-negative except for mild sigmoid diverticulosis)-having some diarrhea ever since colonoscopy.  No clinical evidence of diverticulitis.  -Would proceed with colonoscopy with a 2-day preparation for colorectal cancer screening continue high-fiber diet. -Bentyl 10mg  po bid. -If still with problems, would consider repeating CT scan of the abdomen and pelvis. Pt will call us and let us know.  Watch for any fever chills or night sweats or increasing abdominal pain.     #2.  Hoarseness (H/O LPR, EGD 03/2016, followed by Dr Neldon Mc, has asthma) - Switch from omeprazole to protonix 40mg  po qd. - Continue Zantac 150 milligrams p.o. nightly. - Recommend reevaluation by Dr. Gaylyn Cheers ENT. - If still with problems, proceed with EGD. - I have discussed above in detail with the patient in detail.      HPI:    Chief Complaint:  for FU. Had colonoscopy 12/07/2017 showing moderate to severe sigmoid diverticulosis. Had diarrhea after colonoscopy and has been having lower abdominal crampy pain without any fever or chills. Still with hoarseness despite of increasing omeprazole to  twice daily. No heartburn   From previous note: She does complain of chronic hoarseness for which she has been followed by Dr. Gaylyn Cheers from ENT. She will also occasionally have some heartburn.  She had undergone EGD on 03/23/2016 which did not show any evidence of esophagitis.  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  She denies having any dysphagia or odynophagia. Sh also has been followed by Dr. Neldon Mc (allergy specialist)    Review of systems:           Past Medical History:  Diagnosis Date  . Allergic rhinoconjunctivitis   . Anxiety   . Asthma   . Depression   . Diverticulitis   . Hypothyroid   .  Insomnia   . Laryngopharyngeal reflux (LPR)   . Migraines   . Osteoarthritis   . STD (sexually transmitted disease)    HSV II          Current Outpatient Medications  Medication Sig Dispense Refill  . albuterol (PROAIR HFA) 108 (90 Base) MCG/ACT inhaler Inhale 2 puffs into the lungs every 4 (four) hours as needed for wheezing or shortness of breath.    . budesonide (RHINOCORT ALLERGY) 32 MCG/ACT nasal spray Place 1 spray into both nostrils daily. Reported on 02/04/2016    . Calcium Carbonate-Vitamin D (CALCIUM + D PO) Take 1,200 mg by mouth daily.    . chlorpheniramine-HYDROcodone (TUSSIONEX PENNKINETIC ER) 10-8 MG/5ML SUER Take one half to one teaspoon every 12 hours if needed for cough 60 mL 0  . levothyroxine (SYNTHROID, LEVOTHROID) 50 MCG tablet TK 1 T PO D  1  . Multiple Vitamin (MULTIVITAMIN) capsule Take 1 capsule by mouth daily.    . Multiple Vitamins-Minerals (HAIR SKIN NAILS PO) Take by mouth.    . nitrofurantoin, macrocrystal-monohydrate, (MACROBID) 100 MG capsule Take 1 capsule (100 mg total) by mouth as needed. 30 capsule 2  . NONFORMULARY OR COMPOUNDED ITEM [facility]  Peripheral Neuropathy Cream- Bupivacaine 1%, Doxepin 3%, Gabapentin 6%, Pentoxifylline 3%, Topiramate 1% Apply 1-2 grams to affected area 3-4 times daily Qty. 120 gm 3 refills 1 each 0  . omeprazole (PRILOSEC) 40 MG capsule Take one capsule by mouth every morning as directed. 90 capsule 1  . ranitidine (ZANTAC) 300 MG tablet  Take one tablet every evening as directed 90 tablet 1  . valACYclovir (VALTREX) 500 MG tablet Take 1 tablet (500 mg total) by mouth daily. 30 tablet 2   No current facility-administered medications for this visit.     Patient's surgical history, family medical history, social history and allergies were all reviewed in Epic    Physical Exam:     BP 110/68   Pulse 72   Ht 5\' 1"  (1.549 m)   Wt 145 lb 4 oz (65.9 kg)   LMP 08/22/2005   SpO2 94%    BMI 27.44 kg/m   GENERAL:  NAD PSYCH: :Pleasant, cooperative, normal affect EENT:  conjunctiva pink, mucous membranes moist, neck supple without masses CARDIAC:  RRR, No murmur heard, + peripheral edema PULM: Normal respiratory effort, lungs CTA bilaterally, no wheezing ABDOMEN:  Nondistended, soft, nontender. No obvious masses, no hepatomegaly,  normal bowel sounds SKIN:  turgor, no lesions seen Musculoskeletal:  Normal muscle tone, normal strength NEURO: Alert and oriented x 3, no focal neurologic deficits.  Cc Dr Tobie Poet

## 2018-01-02 NOTE — Patient Instructions (Signed)
If you are age 68 or older, your body mass index should be between 23-30. Your Body mass index is 27.02 kg/m. If this is out of the aforementioned range listed, please consider follow up with your Primary Care Provider.  If you are age 58 or younger, your body mass index should be between 19-25. Your Body mass index is 27.02 kg/m. If this is out of the aformentioned range listed, please consider follow up with your Primary Care Provider.   We have sent the following medications to your pharmacy for you to pick up at your convenience: Protonix 40 mg every morning.  Bentyl 10 mg twice daily 1/2 hour before breakfast and bedtime.   Please follow up with Dr. Gaylyn Cheers.  Thank you,  Dr. Jackquline Denmark

## 2018-01-14 ENCOUNTER — Other Ambulatory Visit: Payer: Self-pay | Admitting: Allergy and Immunology

## 2018-02-02 ENCOUNTER — Telehealth: Payer: Self-pay | Admitting: Gastroenterology

## 2018-02-02 NOTE — Telephone Encounter (Signed)
Patient states medication is working and Pt reschedule appt with Dr.Ma ENT to 6.24.19.Marland KitchenMarland KitchenFYI

## 2018-02-02 NOTE — Telephone Encounter (Signed)
FYI Dr. Gupta.

## 2018-02-12 DIAGNOSIS — J342 Deviated nasal septum: Secondary | ICD-10-CM | POA: Diagnosis not present

## 2018-02-12 DIAGNOSIS — J45991 Cough variant asthma: Secondary | ICD-10-CM | POA: Diagnosis not present

## 2018-02-12 DIAGNOSIS — J387 Other diseases of larynx: Secondary | ICD-10-CM | POA: Diagnosis not present

## 2018-02-12 DIAGNOSIS — R490 Dysphonia: Secondary | ICD-10-CM | POA: Diagnosis not present

## 2018-02-12 DIAGNOSIS — J45909 Unspecified asthma, uncomplicated: Secondary | ICD-10-CM | POA: Diagnosis not present

## 2018-02-12 DIAGNOSIS — K219 Gastro-esophageal reflux disease without esophagitis: Secondary | ICD-10-CM | POA: Diagnosis not present

## 2018-02-14 ENCOUNTER — Telehealth: Payer: Self-pay | Admitting: Gastroenterology

## 2018-02-14 MED ORDER — DICYCLOMINE HCL 10 MG PO CAPS: 10.0000 mg | ORAL_CAPSULE | Freq: Two times a day (BID) | ORAL | 3 refills | Status: DC

## 2018-02-14 NOTE — Telephone Encounter (Signed)
Sent refills to patients pharmacy.  

## 2018-04-02 DIAGNOSIS — R51 Headache: Secondary | ICD-10-CM | POA: Diagnosis not present

## 2018-04-02 DIAGNOSIS — I10 Essential (primary) hypertension: Secondary | ICD-10-CM | POA: Diagnosis not present

## 2018-04-02 DIAGNOSIS — J984 Other disorders of lung: Secondary | ICD-10-CM | POA: Diagnosis not present

## 2018-04-02 DIAGNOSIS — R410 Disorientation, unspecified: Secondary | ICD-10-CM | POA: Diagnosis not present

## 2018-04-02 DIAGNOSIS — M1990 Unspecified osteoarthritis, unspecified site: Secondary | ICD-10-CM | POA: Diagnosis not present

## 2018-04-10 DIAGNOSIS — M1711 Unilateral primary osteoarthritis, right knee: Secondary | ICD-10-CM | POA: Diagnosis not present

## 2018-04-11 DIAGNOSIS — I161 Hypertensive emergency: Secondary | ICD-10-CM | POA: Diagnosis not present

## 2018-04-11 DIAGNOSIS — R05 Cough: Secondary | ICD-10-CM | POA: Diagnosis not present

## 2018-04-11 DIAGNOSIS — I10 Essential (primary) hypertension: Secondary | ICD-10-CM | POA: Diagnosis not present

## 2018-04-11 DIAGNOSIS — Z8673 Personal history of transient ischemic attack (TIA), and cerebral infarction without residual deficits: Secondary | ICD-10-CM | POA: Diagnosis not present

## 2018-04-13 ENCOUNTER — Other Ambulatory Visit: Payer: Self-pay | Admitting: Allergy and Immunology

## 2018-04-13 DIAGNOSIS — I6789 Other cerebrovascular disease: Secondary | ICD-10-CM | POA: Diagnosis not present

## 2018-04-13 DIAGNOSIS — I10 Essential (primary) hypertension: Secondary | ICD-10-CM | POA: Diagnosis not present

## 2018-04-13 DIAGNOSIS — Z8673 Personal history of transient ischemic attack (TIA), and cerebral infarction without residual deficits: Secondary | ICD-10-CM | POA: Diagnosis not present

## 2018-04-13 DIAGNOSIS — I6523 Occlusion and stenosis of bilateral carotid arteries: Secondary | ICD-10-CM | POA: Diagnosis not present

## 2018-04-13 DIAGNOSIS — R200 Anesthesia of skin: Secondary | ICD-10-CM | POA: Diagnosis not present

## 2018-04-13 DIAGNOSIS — E785 Hyperlipidemia, unspecified: Secondary | ICD-10-CM | POA: Diagnosis not present

## 2018-04-16 DIAGNOSIS — N3001 Acute cystitis with hematuria: Secondary | ICD-10-CM | POA: Diagnosis not present

## 2018-05-07 DIAGNOSIS — R69 Illness, unspecified: Secondary | ICD-10-CM | POA: Diagnosis not present

## 2018-05-08 ENCOUNTER — Telehealth: Payer: Self-pay | Admitting: Gastroenterology

## 2018-05-08 ENCOUNTER — Other Ambulatory Visit: Payer: Self-pay | Admitting: Allergy and Immunology

## 2018-05-08 MED ORDER — FAMOTIDINE 40 MG PO TABS: 40.0000 mg | ORAL_TABLET | Freq: Every day | ORAL | 6 refills | Status: DC

## 2018-05-08 NOTE — Telephone Encounter (Signed)
Patient states her allergy doctor prescribes her ranitidine but pt wants to know if Dr.Gupta could start refilling it for her, if she should be taking it. Patient states she heard from a friend that it might cause cancer, and since she already take medication omeprazole, which Dr.Gupta does  Prescribe does she need both? Patient requesting to speak with nurse further.

## 2018-05-08 NOTE — Telephone Encounter (Signed)
Pt states she takes zantac 300mg  q hs that is prescribed by Dr. Carmelina Peal. He will not refill the med for her unless she sees him for an OV. Pt wants to know if Dr. Lyndel Safe will fill the med for her since she is seeing him now for reflux. Please advise.

## 2018-05-08 NOTE — Telephone Encounter (Signed)
Spoke with pt and she is aware. Script sent to pharmacy. 

## 2018-05-08 NOTE — Telephone Encounter (Signed)
Since there are some reports regarding Zantac being contaminated with cancer causing nitrosamines Lets switch to Pepcid 40 mg p.o. Nightly 30.  With 6 refills.

## 2018-05-17 DIAGNOSIS — G4709 Other insomnia: Secondary | ICD-10-CM | POA: Diagnosis not present

## 2018-05-17 DIAGNOSIS — Z23 Encounter for immunization: Secondary | ICD-10-CM | POA: Diagnosis not present

## 2018-05-17 DIAGNOSIS — E038 Other specified hypothyroidism: Secondary | ICD-10-CM | POA: Diagnosis not present

## 2018-05-17 DIAGNOSIS — J45991 Cough variant asthma: Secondary | ICD-10-CM | POA: Diagnosis not present

## 2018-05-17 DIAGNOSIS — K219 Gastro-esophageal reflux disease without esophagitis: Secondary | ICD-10-CM | POA: Diagnosis not present

## 2018-05-17 DIAGNOSIS — I10 Essential (primary) hypertension: Secondary | ICD-10-CM | POA: Diagnosis not present

## 2018-05-17 DIAGNOSIS — R7309 Other abnormal glucose: Secondary | ICD-10-CM | POA: Diagnosis not present

## 2018-05-17 DIAGNOSIS — R5383 Other fatigue: Secondary | ICD-10-CM | POA: Diagnosis not present

## 2018-05-22 DIAGNOSIS — M1712 Unilateral primary osteoarthritis, left knee: Secondary | ICD-10-CM | POA: Diagnosis not present

## 2018-05-22 DIAGNOSIS — M1711 Unilateral primary osteoarthritis, right knee: Secondary | ICD-10-CM | POA: Diagnosis not present

## 2018-05-24 ENCOUNTER — Ambulatory Visit (INDEPENDENT_AMBULATORY_CARE_PROVIDER_SITE_OTHER): Payer: Medicare HMO | Admitting: Allergy and Immunology

## 2018-05-24 ENCOUNTER — Encounter: Payer: Self-pay | Admitting: Allergy and Immunology

## 2018-05-24 VITALS — BP 114/72 | HR 72 | Resp 16

## 2018-05-24 DIAGNOSIS — R0609 Other forms of dyspnea: Secondary | ICD-10-CM

## 2018-05-24 DIAGNOSIS — J4520 Mild intermittent asthma, uncomplicated: Secondary | ICD-10-CM

## 2018-05-24 DIAGNOSIS — J3089 Other allergic rhinitis: Secondary | ICD-10-CM | POA: Diagnosis not present

## 2018-05-24 DIAGNOSIS — K219 Gastro-esophageal reflux disease without esophagitis: Secondary | ICD-10-CM

## 2018-05-24 DIAGNOSIS — R0600 Dyspnea, unspecified: Secondary | ICD-10-CM

## 2018-05-24 MED ORDER — ALBUTEROL SULFATE HFA 108 (90 BASE) MCG/ACT IN AERS: INHALATION_SPRAY | RESPIRATORY_TRACT | 1 refills | Status: DC

## 2018-05-24 NOTE — Progress Notes (Signed)
Follow-up Note  Referring Provider: Rochel Brome, MD Primary Provider: Rochel Brome, MD Date of Office Visit: 05/24/2018  Subjective:   Olivia Cruz (DOB: 1951-08-16) is a 67 y.o. female who returns to the [facility] on 05/24/2018 in re-evaluation of the following:  HPI: Olivia Cruz presents to this clinic in evaluation of asthma and allergic rhinitis and LPR.  Her last visit to this clinic was 18 September 2017.  She has been doing very well since her last visit with her asthma and her allergic rhinitis and her reflux.  It does not sound as though she required a systemic steroid or antibiotic to treat any type of respiratory tract issue and she rarely used a short acting bronchodilator and she can breathe through her nose and her throat and her stomach are doing quite well.  However, there is an issue that has developed this summer.  She went to [place] and did some strenuous hiking and she developed very significant dyspnea on exertion to the point where she could only walk for a minute or 2 and then she had to rest to catch her breath.  It would take her several minutes to catch her breath.  As well, after returning home from [place] she did attempt to hike at [facility] and this exact same scenario developed where she would completely lose her breath when she exerted herself and it would take several minutes for recovery.  She never had any associated chest pain or other symptoms suggesting cardiac disease.  She never developed any coughing or wheezing.  She did not have a short acting bronchodilator to use.  Olivia Cruz did have an issue with what sounds like some type of transient neurological event on 02 April 2018 where she developed a rather significant headache in the morning followed by left arm numbness and word finding problem and when she was evaluated in emergency room she had a very high blood pressure.  Multiple scans did not identify any significant  neurological abnormality and everything resolved within several hours of observation.  She is now taking aspirin and lisinopril.  Allergies as of 05/24/2018      Reactions   Codeine Anaphylaxis, Nausea Only   Ambien [zolpidem Tartrate] Other (See Comments)   Memory loss per patient   Augmentin [amoxicillin-pot Clavulanate] Nausea And Vomiting   Dilaudid [hydromorphone]    Oxycodone    Sulfa Antibiotics       Medication List      CALCIUM + D PO Take 1,200 mg by mouth daily.   dicyclomine 10 MG capsule Commonly known as:  BENTYL Take 1 capsule (10 mg total) by mouth 2 (two) times daily at 8 am and 10 pm.   famotidine 40 MG tablet Commonly known as:  PEPCID Take 1 tablet (40 mg total) by mouth at bedtime.   fexofenadine 180 MG tablet Commonly known as:  ALLEGRA Take 180 mg by mouth daily.   HAIR SKIN NAILS PO Take by mouth.   levothyroxine 50 MCG tablet Commonly known as:  SYNTHROID, LEVOTHROID TK 1 T PO D   lisinopril 5 MG tablet Commonly known as:  PRINIVIL,ZESTRIL Take 5 mg by mouth daily.   multivitamin capsule Take 1 capsule by mouth daily.   nitrofurantoin (macrocrystal-monohydrate) 100 MG capsule Commonly known as:  MACROBID Take 1 capsule (100 mg total) by mouth as needed.   pantoprazole 40 MG tablet Commonly known as:  PROTONIX Take 1 tablet (40 mg total) by mouth daily.  PROAIR HFA 108 (90 Base) MCG/ACT inhaler Generic drug:  albuterol Inhale 2 puffs into the lungs every 4 (four) hours as needed for wheezing or shortness of breath.   RHINOCORT ALLERGY 32 MCG/ACT nasal spray Generic drug:  budesonide Place 1 spray into both nostrils daily. Reported on 02/04/2016   valACYclovir 500 MG tablet Commonly known as:  VALTREX Take 1 tablet (500 mg total) by mouth daily.       Past Medical History:  Diagnosis Date  . Allergic rhinoconjunctivitis   . Anxiety   . Asthma   . Depression   . Diverticulitis   . GERD (gastroesophageal reflux disease)     . Hypothyroid   . Insomnia   . Laryngopharyngeal reflux (LPR)   . Migraines   . Osteoarthritis   . STD (sexually transmitted disease)    HSV II    Past Surgical History:  Procedure Laterality Date  . BREAST SURGERY Left    times 2  . COLONOSCOPY  06/03/2013   Moderate predominantly sigmoid diverticulosis. Small interal hemorroids  . FOOT SURGERY Right    Removed bone spur  . GANGLION CYST EXCISION Left    foot  . HEMORRHOID SURGERY    . UPPER GI ENDOSCOPY  03/23/2016   Mild gastritis, gastric polyps bxbenign squamous mucosa with no abnormaility, fundic glad poly in setting of mild chron gastritis.    Review of systems negative except as noted in HPI / PMHx or noted below:  Review of Systems  Constitutional: Negative.   HENT: Negative.   Eyes: Negative.   Respiratory: Negative.   Cardiovascular: Negative.   Gastrointestinal: Negative.   Genitourinary: Negative.   Musculoskeletal: Negative.   Skin: Negative.   Neurological: Negative.   Endo/Heme/Allergies: Negative.   Psychiatric/Behavioral: Negative.      Objective:   Vitals:   05/24/18 1639  BP: 114/72  Pulse: 72  Resp: 16  SpO2: 95%          Physical Exam  HENT:  Head: Normocephalic.  Right Ear: Tympanic membrane, external ear and ear canal normal.  Left Ear: Tympanic membrane, external ear and ear canal normal.  Nose: Nose normal. No mucosal edema or rhinorrhea.  Mouth/Throat: Uvula is midline, oropharynx is clear and moist and mucous membranes are normal. No oropharyngeal exudate.  Eyes: Conjunctivae are normal.  Neck: Trachea normal. No tracheal tenderness present. No tracheal deviation present. No thyromegaly present.  Cardiovascular: Normal rate, regular rhythm, S1 normal, S2 normal and normal heart sounds.  No murmur heard. Pulmonary/Chest: Breath sounds normal. No stridor. No respiratory distress. She has no wheezes. She has no rales.  Musculoskeletal: She exhibits no edema.   Lymphadenopathy:       Head (right side): No tonsillar adenopathy present.       Head (left side): No tonsillar adenopathy present.    She has no cervical adenopathy.  Neurological: She is alert.  Skin: No rash noted. She is not diaphoretic. No erythema. Nails show no clubbing.    Diagnostics:    Spirometry was performed and demonstrated an FEV1 of 2.25 at 106 % of predicted.  The patient had an Asthma Control Test with the following results: ACT Total Score: 18.    Oxygen saturation at rest on room air was 95%.  With walking up and down the hallway her oxygen saturation was 97% on room air.  Assessment and Plan:   1. Asthma, mild intermittent, well-controlled   2. Other allergic rhinitis   3. LPRD (laryngopharyngeal reflux disease)  4. Dyspnea on exertion      1. Continue to Treat and prevent inflammation:   A. OTC Rhinocort one spray each nostril 1-7 times per week  2. Continue to Treat and prevent reflux:   A. Continue to remain away from caffeine and chocolate  B.  Pantoprazole 40 mg in AM  C. Famotidine 40mg  in evening  3. If needed:   A. ProAir HFA 2 puffs every 4-6 hours  B. OTC Mucinex DM 2 tablets twice a day  C. over-the-counter antihistamine  4.  Evaluation with cardiologist for dyspnea on exertion  5. Return to clinic in 6 months or earlier if problem  6. Obtain fall flu vaccine  I think that Olivia Cruz is doing well from a standpoint of asthma and allergic rhinitis and her reflux induced respiratory disease on her current therapy and she will continue to utilize this plan.  However, I think she needs to have evaluation with a cardiologist to have an exercise test performed.  Her story of developing very significant shortness of breath when she exerted herself out in [place] and at [employer] recently warrants further evaluation for clinically significant coronary artery disease.  If she checks out okay from a cardiology standpoint then she may  possibly have an issue affecting her lungs giving rise to this issue or this may all just be an issue associated with cardiopulmonary deconditioning.    Allena Katz, MD Allergy / Immunology [place]

## 2018-05-24 NOTE — Patient Instructions (Addendum)
   1. Continue to Treat and prevent inflammation:   A. OTC Rhinocort one spray each nostril 1-7 times per week  2. Continue to Treat and prevent reflux:   A. Continue to remain away from caffeine and chocolate  B.  Pantoprazole 40 mg in AM  C. Famotidine 40mg  in evening  3. If needed:   A. ProAir HFA 2 puffs every 4-6 hours  B. OTC Mucinex DM 2 tablets twice a day  C. over-the-counter antihistamine  4.  Evaluation with cardiologist for dyspnea on exertion  5. Return to clinic in 6 months or earlier if problem  6. Obtain fall flu vaccine

## 2018-05-27 ENCOUNTER — Encounter: Payer: Self-pay | Admitting: Allergy and Immunology

## 2018-05-29 ENCOUNTER — Telehealth: Payer: Self-pay | Admitting: Allergy and Immunology

## 2018-05-29 ENCOUNTER — Other Ambulatory Visit: Payer: Self-pay | Admitting: *Deleted

## 2018-05-29 ENCOUNTER — Telehealth: Payer: Self-pay

## 2018-05-29 MED ORDER — ALBUTEROL SULFATE HFA 108 (90 BASE) MCG/ACT IN AERS: INHALATION_SPRAY | RESPIRATORY_TRACT | 1 refills | Status: DC

## 2018-05-29 NOTE — Telephone Encounter (Signed)
Referral has been sent to their office.  Thanks [employer]

## 2018-05-29 NOTE — Telephone Encounter (Signed)
RX sent

## 2018-05-29 NOTE — Telephone Encounter (Signed)
-----   Message from Vickii Penna, [place] sent at 05/29/2018 12:00 PM EDT ----- Naoma Diener and Lelan Pons:-)  Can y'all refer [name] to see Dr. Agustin Cree at [facility] in [place]?  She is a previous patient at that practice. Diagnosis is dyspnea upon exertion..  Thank you very much:-)  Girtha Rm

## 2018-05-29 NOTE — Telephone Encounter (Signed)
Olivia Cruz would like either Ventolin [facility]il [facility] [address] as replacement for PROAIR since her insurance will not cover it.

## 2018-06-12 DIAGNOSIS — J060 Acute laryngopharyngitis: Secondary | ICD-10-CM | POA: Diagnosis not present

## 2018-06-17 DIAGNOSIS — J209 Acute bronchitis, unspecified: Secondary | ICD-10-CM | POA: Diagnosis not present

## 2018-07-20 DIAGNOSIS — N3001 Acute cystitis with hematuria: Secondary | ICD-10-CM | POA: Diagnosis not present

## 2018-07-20 DIAGNOSIS — N3091 Cystitis, unspecified with hematuria: Secondary | ICD-10-CM | POA: Diagnosis not present

## 2018-07-20 DIAGNOSIS — R300 Dysuria: Secondary | ICD-10-CM | POA: Diagnosis not present

## 2018-07-23 ENCOUNTER — Other Ambulatory Visit: Payer: Self-pay

## 2018-07-23 ENCOUNTER — Encounter: Payer: Self-pay | Admitting: Obstetrics & Gynecology

## 2018-07-23 ENCOUNTER — Ambulatory Visit: Payer: Medicare HMO | Admitting: Obstetrics & Gynecology

## 2018-07-23 VITALS — BP 126/80 | HR 80 | Resp 16 | Ht 61.0 in | Wt 140.2 lb

## 2018-07-23 DIAGNOSIS — R300 Dysuria: Secondary | ICD-10-CM | POA: Diagnosis not present

## 2018-07-23 DIAGNOSIS — N898 Other specified noninflammatory disorders of vagina: Secondary | ICD-10-CM

## 2018-07-23 LAB — POCT URINALYSIS DIPSTICK
Bilirubin, UA: NEGATIVE
Blood, UA: NEGATIVE
Glucose, UA: NEGATIVE
Ketones, UA: NEGATIVE
Leukocytes, UA: NEGATIVE
Nitrite, UA: NEGATIVE
Protein, UA: NEGATIVE
Urobilinogen, UA: 0.2 E.U./dL
pH, UA: 7 (ref 5.0–8.0)

## 2018-07-23 MED ORDER — NITROFURANTOIN MONOHYD MACRO 100 MG PO CAPS: 100.0000 mg | ORAL_CAPSULE | ORAL | 1 refills | Status: DC | PRN

## 2018-07-23 NOTE — Progress Notes (Signed)
GYNECOLOGY  VISIT  CC:  Urinary Urgency  HPI: 67 y.o. G4P0022 Married White or Caucasian female here for urgency and urinary frequency that started on Thursday.  Symptoms worsened on Friday and she went to Urgent Care in [place].  He treated her for a UTI with Ceftin bid.  Urine culture was negative (she was called today).    She reports she did have intercourse on Tuesday previous and She did have intercourse on Tuesday.  Denies vaginal bleeding.  Has not had any blood in urine.    GYNECOLOGIC HISTORY: Patient's last menstrual period was 08/22/2005. Contraception: PMP Menopausal hormone therapy: none  Patient Active Problem List   Diagnosis Date Noted  . Shortness of breath 07/12/2017  . Chest tightness 09/29/2015  . Cervical os stenosis 04/09/2015  . Postmenopausal atrophic vaginitis 04/09/2015    Past Medical History:  Diagnosis Date  . Allergic rhinoconjunctivitis   . Anxiety   . Asthma   . Depression   . Diverticulitis   . GERD (gastroesophageal reflux disease)   . Hypothyroid   . Insomnia   . Laryngopharyngeal reflux (LPR)   . Migraines   . Osteoarthritis   . STD (sexually transmitted disease)    HSV II    Past Surgical History:  Procedure Laterality Date  . BREAST SURGERY Left    times 2  . COLONOSCOPY  06/03/2013   Moderate predominantly sigmoid diverticulosis. Small interal hemorroids  . FOOT SURGERY Right    Removed bone spur  . GANGLION CYST EXCISION Left    foot  . HEMORRHOID SURGERY    . UPPER GI ENDOSCOPY  03/23/2016   Mild gastritis, gastric polyps bxbenign squamous mucosa with no abnormaility, fundic glad poly in setting of mild chron gastritis.    MEDS:   Current Outpatient Medications on File Prior to Visit  Medication Sig Dispense Refill  . albuterol (PROAIR HFA) 108 (90 Base) MCG/ACT inhaler Inhale two puffs every four to six hours as needed for cough or wheeze. 1 Inhaler 1  . budesonide (RHINOCORT ALLERGY) 32 MCG/ACT nasal spray Place 1  spray into both nostrils daily. Reported on 02/04/2016    . Calcium Carbonate-Vitamin D (CALCIUM + D PO) Take 1,200 mg by mouth daily.    . cefUROXime (CEFTIN) 250 MG tablet Take 1 tablet by mouth 2 (two) times daily.    . Collagen 500 MG CAPS Take by mouth daily.    Marland Kitchen dicyclomine (BENTYL) 10 MG capsule Take 1 capsule (10 mg total) by mouth 2 (two) times daily at 8 am and 10 pm. 60 capsule 3  . famotidine (PEPCID) 40 MG tablet Take 1 tablet (40 mg total) by mouth at bedtime. 30 tablet 6  . levothyroxine (SYNTHROID, LEVOTHROID) 50 MCG tablet TK 1 T PO D  1  . lisinopril (PRINIVIL,ZESTRIL) 5 MG tablet Take 5 mg by mouth daily.  2  . Multiple Vitamin (MULTIVITAMIN) capsule Take 1 capsule by mouth daily.    . Multiple Vitamins-Minerals (HAIR SKIN NAILS PO) Take by mouth.    . nitrofurantoin, macrocrystal-monohydrate, (MACROBID) 100 MG capsule Take 1 capsule (100 mg total) by mouth as needed. 30 capsule 2  . pantoprazole (PROTONIX) 40 MG tablet Take 1 tablet (40 mg total) by mouth daily. 30 tablet 6  . valACYclovir (VALTREX) 500 MG tablet Take 1 tablet (500 mg total) by mouth daily. 30 tablet 2   No current facility-administered medications on file prior to visit.     ALLERGIES: Codeine; Ambien [zolpidem tartrate]; Augmentin [  amoxicillin-pot clavulanate]; Dilaudid [hydromorphone]; Oxycodone; Sulfa antibiotics; and Tramadol  Family History  Problem Relation Age of Onset  . Breast cancer Sister        lung  . Cancer Maternal Grandmother   . Multiple births Maternal Grandmother   . Multiple births Paternal Grandmother   . Cancer Paternal Grandmother   . Thyroid disease Mother   . Heart failure Mother   . Parkinson's disease Father     SH:  Married, non smoker  Review of Systems  Genitourinary: Positive for frequency and urgency.       Night urination   All other systems reviewed and are negative.   PHYSICAL EXAMINATION:    BP 126/80 (BP Location: Left Arm, Patient Position: Sitting,  Cuff Size: Normal)   Pulse 80   Resp 16   Ht 5\' 1"  (1.549 m)   Wt 140 lb 3.2 oz (63.6 kg)   LMP 08/22/2005   BMI 26.49 kg/m     General appearance: alert, cooperative and appears stated age Lymph:  no inguinal LAD noted  Pelvic: External genitalia:  no lesions              Urethra:  normal appearing urethra with no masses, tenderness or lesions              Bartholins and Skenes: normal                 Vagina: Significant vaginal atrophic changes, no discharge, no lesions              Cervix: no lesions              Bimanual Exam:  Uterus:  normal size, contour, position, consistency, mobility, non-tender              Adnexa: no mass, fullness, tenderness  Chaperone was present for exam.  Assessment: Urinary urgency Vaginal atrophic changes  Plan: Urine culture and urine micro obtained today. Affirm pending Will switch antibiotics to macrobid 100mg  bid until culture is back

## 2018-07-24 LAB — URINALYSIS, MICROSCOPIC ONLY
Bacteria, UA: NONE SEEN
Casts: NONE SEEN /lpf
RBC, UA: NONE SEEN /hpf (ref 0–2)
WBC, UA: NONE SEEN /hpf (ref 0–5)

## 2018-07-24 LAB — VAGINITIS/VAGINOSIS, DNA PROBE
Candida Species: NEGATIVE
Gardnerella vaginalis: NEGATIVE
Trichomonas vaginosis: NEGATIVE

## 2018-07-24 LAB — URINE CULTURE

## 2018-07-26 ENCOUNTER — Telehealth: Payer: Self-pay | Admitting: *Deleted

## 2018-07-26 NOTE — Telephone Encounter (Signed)
-----   Message from Megan Salon, MD sent at 07/25/2018 12:24 PM EST ----- Please let patient know her vaginitis testing and urine culture were both negative.  I think her symptoms are coming from lack of estrogen.  This can be treated with a vaginal estrogen cream like Estrace, 1 g PV twice weekly.  It could also be treated with something like vitamin E vaginal suppositories, 1 PV 2-3 times weekly.  The estrogen will work faster but she may not want to use this because it is an estrogen.  I will send in a prescription wants to know what she desires.  Thank you.

## 2018-07-26 NOTE — Telephone Encounter (Signed)
Left voice mail to call back 

## 2018-07-30 ENCOUNTER — Telehealth: Payer: Self-pay | Admitting: Gastroenterology

## 2018-07-30 MED ORDER — NONFORMULARY OR COMPOUNDED ITEM: 3 refills | Status: DC

## 2018-07-30 NOTE — Telephone Encounter (Signed)
Patient says [facility] is not able fill the vit E suppositories. They told her to try [facility] [address].

## 2018-07-30 NOTE — Telephone Encounter (Signed)
Pt notified. Verbalized understanding. Patient is going to try Vit E first.  Rx sent to [facility]. Per patient request.

## 2018-07-30 NOTE — Telephone Encounter (Signed)
Called and spoke with patient-patient reports she went to the ENT a few months ago but was told it was not likely due to ENT; does she need to be seen by allergy MD or by GI MD?  Patient reports the hoarseness has gotten worse since changing PPI; but states she is not/has not been "sick"; patient would like to know if the Pepcid could be the cause of the hoarseness or could it be from her allergies -which doctor does she need to be seen by?  Please advise

## 2018-07-30 NOTE — Telephone Encounter (Signed)
Rx faxed to [facility]

## 2018-07-30 NOTE — Telephone Encounter (Signed)
Pt has questions regarding medication (pepcid)

## 2018-07-30 NOTE — Telephone Encounter (Signed)
LM for pt to call back. Second attempt  

## 2018-07-31 NOTE — Telephone Encounter (Signed)
Lets send her to voice center at [facility] (RE: extreme hoarseness) I believe it is Dr Addison Bailey (ENT). Pl call his office [phone]). If problems, pl let me know

## 2018-08-01 NOTE — Telephone Encounter (Signed)
Referral faxed/called in to Dr. Addison Bailey (ENT at [facility]); patient is scheduled for 09/12/18 appt at 9:40 am for Dr, Joya Gaskins and 10:00 am for speech pathologist; new patient packet will be mailed to patient from Dr. Bettina Gavia office; if patient unable to make appt or needs to reschedule appt- patient must call [phone];  Called and spoke with patient-patient stated she would "call back when she is able to write these instructions down"; please relay this message to her when she calls;

## 2018-08-02 NOTE — Telephone Encounter (Signed)
Patient returned call to office -patient informed of appt information for Dr. Joya Gaskins (ENT); patient verbalized understanding of information/instructions; patient advised to call back if questions/concerns arise;

## 2018-08-09 DIAGNOSIS — J189 Pneumonia, unspecified organism: Secondary | ICD-10-CM | POA: Diagnosis not present

## 2018-08-23 DIAGNOSIS — R69 Illness, unspecified: Secondary | ICD-10-CM | POA: Diagnosis not present

## 2018-09-10 ENCOUNTER — Telehealth: Payer: Self-pay | Admitting: Gastroenterology

## 2018-09-10 NOTE — Telephone Encounter (Signed)
Would you like to give refills, if so directions and how many?

## 2018-09-10 NOTE — Telephone Encounter (Signed)
Patient requesting refill on [facility] [place]

## 2018-09-11 MED ORDER — PANTOPRAZOLE SODIUM 40 MG PO TBEC: 40.0000 mg | DELAYED_RELEASE_TABLET | Freq: Every day | ORAL | 11 refills | Status: DC

## 2018-09-11 NOTE — Telephone Encounter (Signed)
Protonix 40 mg p.o. once a day, 30 with 11 refills

## 2018-09-11 NOTE — Telephone Encounter (Signed)
Sent refills to patients pharmacy.  

## 2018-09-12 DIAGNOSIS — K219 Gastro-esophageal reflux disease without esophagitis: Secondary | ICD-10-CM | POA: Insufficient documentation

## 2018-09-12 DIAGNOSIS — Z87891 Personal history of nicotine dependence: Secondary | ICD-10-CM | POA: Diagnosis not present

## 2018-09-12 DIAGNOSIS — J383 Other diseases of vocal cords: Secondary | ICD-10-CM | POA: Insufficient documentation

## 2018-09-12 DIAGNOSIS — Z79899 Other long term (current) drug therapy: Secondary | ICD-10-CM | POA: Diagnosis not present

## 2018-09-12 DIAGNOSIS — R490 Dysphonia: Secondary | ICD-10-CM | POA: Insufficient documentation

## 2018-09-12 DIAGNOSIS — J384 Edema of larynx: Secondary | ICD-10-CM | POA: Diagnosis not present

## 2018-09-12 HISTORY — DX: Dysphonia: R49.0

## 2018-09-12 HISTORY — DX: Other diseases of vocal cords: J38.3

## 2018-09-18 DIAGNOSIS — E663 Overweight: Secondary | ICD-10-CM | POA: Diagnosis not present

## 2018-09-18 DIAGNOSIS — R7301 Impaired fasting glucose: Secondary | ICD-10-CM | POA: Diagnosis not present

## 2018-09-18 DIAGNOSIS — E038 Other specified hypothyroidism: Secondary | ICD-10-CM | POA: Diagnosis not present

## 2018-09-19 DIAGNOSIS — E038 Other specified hypothyroidism: Secondary | ICD-10-CM | POA: Diagnosis not present

## 2018-09-19 DIAGNOSIS — R69 Illness, unspecified: Secondary | ICD-10-CM | POA: Diagnosis not present

## 2018-09-19 DIAGNOSIS — K219 Gastro-esophageal reflux disease without esophagitis: Secondary | ICD-10-CM | POA: Diagnosis not present

## 2018-09-19 DIAGNOSIS — J45991 Cough variant asthma: Secondary | ICD-10-CM | POA: Diagnosis not present

## 2018-09-19 DIAGNOSIS — G2581 Restless legs syndrome: Secondary | ICD-10-CM | POA: Diagnosis not present

## 2018-09-19 DIAGNOSIS — I10 Essential (primary) hypertension: Secondary | ICD-10-CM | POA: Diagnosis not present

## 2018-09-19 DIAGNOSIS — R5383 Other fatigue: Secondary | ICD-10-CM | POA: Diagnosis not present

## 2018-09-19 DIAGNOSIS — G4709 Other insomnia: Secondary | ICD-10-CM | POA: Diagnosis not present

## 2018-09-19 DIAGNOSIS — R7309 Other abnormal glucose: Secondary | ICD-10-CM | POA: Diagnosis not present

## 2018-09-20 DIAGNOSIS — D2239 Melanocytic nevi of other parts of face: Secondary | ICD-10-CM | POA: Diagnosis not present

## 2018-09-20 DIAGNOSIS — L570 Actinic keratosis: Secondary | ICD-10-CM | POA: Diagnosis not present

## 2018-09-20 DIAGNOSIS — L918 Other hypertrophic disorders of the skin: Secondary | ICD-10-CM | POA: Diagnosis not present

## 2018-09-20 DIAGNOSIS — D1801 Hemangioma of skin and subcutaneous tissue: Secondary | ICD-10-CM | POA: Diagnosis not present

## 2018-09-20 DIAGNOSIS — D225 Melanocytic nevi of trunk: Secondary | ICD-10-CM | POA: Diagnosis not present

## 2018-10-17 DIAGNOSIS — R490 Dysphonia: Secondary | ICD-10-CM | POA: Diagnosis not present

## 2018-10-17 DIAGNOSIS — K219 Gastro-esophageal reflux disease without esophagitis: Secondary | ICD-10-CM | POA: Diagnosis not present

## 2018-10-17 DIAGNOSIS — J383 Other diseases of vocal cords: Secondary | ICD-10-CM | POA: Diagnosis not present

## 2018-11-06 ENCOUNTER — Other Ambulatory Visit: Payer: Self-pay | Admitting: Gastroenterology

## 2018-11-06 DIAGNOSIS — Z0001 Encounter for general adult medical examination with abnormal findings: Secondary | ICD-10-CM | POA: Diagnosis not present

## 2018-11-06 DIAGNOSIS — N958 Other specified menopausal and perimenopausal disorders: Secondary | ICD-10-CM | POA: Diagnosis not present

## 2018-11-06 DIAGNOSIS — Z6827 Body mass index (BMI) 27.0-27.9, adult: Secondary | ICD-10-CM | POA: Diagnosis not present

## 2018-11-06 DIAGNOSIS — Z1231 Encounter for screening mammogram for malignant neoplasm of breast: Secondary | ICD-10-CM | POA: Diagnosis not present

## 2018-11-06 DIAGNOSIS — R69 Illness, unspecified: Secondary | ICD-10-CM | POA: Diagnosis not present

## 2018-11-06 DIAGNOSIS — R05 Cough: Secondary | ICD-10-CM | POA: Diagnosis not present

## 2018-11-06 DIAGNOSIS — L650 Telogen effluvium: Secondary | ICD-10-CM | POA: Diagnosis not present

## 2018-12-03 DIAGNOSIS — R69 Illness, unspecified: Secondary | ICD-10-CM | POA: Diagnosis not present

## 2018-12-03 DIAGNOSIS — K219 Gastro-esophageal reflux disease without esophagitis: Secondary | ICD-10-CM | POA: Diagnosis not present

## 2018-12-03 DIAGNOSIS — I10 Essential (primary) hypertension: Secondary | ICD-10-CM | POA: Diagnosis not present

## 2018-12-03 DIAGNOSIS — J45991 Cough variant asthma: Secondary | ICD-10-CM | POA: Diagnosis not present

## 2018-12-03 DIAGNOSIS — G2581 Restless legs syndrome: Secondary | ICD-10-CM | POA: Diagnosis not present

## 2018-12-13 DIAGNOSIS — H524 Presbyopia: Secondary | ICD-10-CM | POA: Diagnosis not present

## 2018-12-13 DIAGNOSIS — Z0100 Encounter for examination of eyes and vision without abnormal findings: Secondary | ICD-10-CM | POA: Diagnosis not present

## 2018-12-25 DIAGNOSIS — M25561 Pain in right knee: Secondary | ICD-10-CM | POA: Diagnosis not present

## 2018-12-25 DIAGNOSIS — M170 Bilateral primary osteoarthritis of knee: Secondary | ICD-10-CM | POA: Diagnosis not present

## 2018-12-25 DIAGNOSIS — G8929 Other chronic pain: Secondary | ICD-10-CM | POA: Diagnosis not present

## 2018-12-25 DIAGNOSIS — M11161 Familial chondrocalcinosis, right knee: Secondary | ICD-10-CM | POA: Diagnosis not present

## 2018-12-25 DIAGNOSIS — M11162 Familial chondrocalcinosis, left knee: Secondary | ICD-10-CM | POA: Diagnosis not present

## 2018-12-25 DIAGNOSIS — M25562 Pain in left knee: Secondary | ICD-10-CM | POA: Diagnosis not present

## 2018-12-25 DIAGNOSIS — M25461 Effusion, right knee: Secondary | ICD-10-CM

## 2018-12-25 HISTORY — DX: Effusion, right knee: M25.461

## 2019-01-08 DIAGNOSIS — L728 Other follicular cysts of the skin and subcutaneous tissue: Secondary | ICD-10-CM | POA: Diagnosis not present

## 2019-01-08 DIAGNOSIS — C44622 Squamous cell carcinoma of skin of right upper limb, including shoulder: Secondary | ICD-10-CM | POA: Diagnosis not present

## 2019-01-08 DIAGNOSIS — C44519 Basal cell carcinoma of skin of other part of trunk: Secondary | ICD-10-CM | POA: Diagnosis not present

## 2019-01-18 DIAGNOSIS — R69 Illness, unspecified: Secondary | ICD-10-CM | POA: Diagnosis not present

## 2019-01-18 DIAGNOSIS — I10 Essential (primary) hypertension: Secondary | ICD-10-CM | POA: Diagnosis not present

## 2019-01-18 DIAGNOSIS — G2581 Restless legs syndrome: Secondary | ICD-10-CM | POA: Diagnosis not present

## 2019-02-07 DIAGNOSIS — C44629 Squamous cell carcinoma of skin of left upper limb, including shoulder: Secondary | ICD-10-CM | POA: Diagnosis not present

## 2019-02-13 DIAGNOSIS — Z1231 Encounter for screening mammogram for malignant neoplasm of breast: Secondary | ICD-10-CM | POA: Diagnosis not present

## 2019-02-18 DIAGNOSIS — K219 Gastro-esophageal reflux disease without esophagitis: Secondary | ICD-10-CM | POA: Diagnosis not present

## 2019-02-18 DIAGNOSIS — R490 Dysphonia: Secondary | ICD-10-CM | POA: Diagnosis not present

## 2019-02-18 DIAGNOSIS — J383 Other diseases of vocal cords: Secondary | ICD-10-CM | POA: Diagnosis not present

## 2019-02-18 DIAGNOSIS — R05 Cough: Secondary | ICD-10-CM | POA: Diagnosis not present

## 2019-02-19 DIAGNOSIS — G8929 Other chronic pain: Secondary | ICD-10-CM | POA: Diagnosis not present

## 2019-02-19 DIAGNOSIS — M170 Bilateral primary osteoarthritis of knee: Secondary | ICD-10-CM | POA: Diagnosis not present

## 2019-02-19 DIAGNOSIS — M25562 Pain in left knee: Secondary | ICD-10-CM | POA: Diagnosis not present

## 2019-02-19 DIAGNOSIS — M25561 Pain in right knee: Secondary | ICD-10-CM | POA: Diagnosis not present

## 2019-03-07 DIAGNOSIS — R7309 Other abnormal glucose: Secondary | ICD-10-CM | POA: Diagnosis not present

## 2019-03-07 DIAGNOSIS — G4709 Other insomnia: Secondary | ICD-10-CM | POA: Diagnosis not present

## 2019-03-07 DIAGNOSIS — E038 Other specified hypothyroidism: Secondary | ICD-10-CM | POA: Diagnosis not present

## 2019-03-07 DIAGNOSIS — G2581 Restless legs syndrome: Secondary | ICD-10-CM | POA: Diagnosis not present

## 2019-03-07 DIAGNOSIS — R5383 Other fatigue: Secondary | ICD-10-CM | POA: Diagnosis not present

## 2019-03-07 DIAGNOSIS — R69 Illness, unspecified: Secondary | ICD-10-CM | POA: Diagnosis not present

## 2019-03-07 DIAGNOSIS — I10 Essential (primary) hypertension: Secondary | ICD-10-CM | POA: Diagnosis not present

## 2019-03-07 DIAGNOSIS — K219 Gastro-esophageal reflux disease without esophagitis: Secondary | ICD-10-CM | POA: Diagnosis not present

## 2019-03-07 DIAGNOSIS — I83813 Varicose veins of bilateral lower extremities with pain: Secondary | ICD-10-CM | POA: Diagnosis not present

## 2019-03-11 DIAGNOSIS — R69 Illness, unspecified: Secondary | ICD-10-CM | POA: Diagnosis not present

## 2019-03-15 ENCOUNTER — Other Ambulatory Visit: Payer: Self-pay | Admitting: Obstetrics & Gynecology

## 2019-03-15 DIAGNOSIS — A6009 Herpesviral infection of other urogenital tract: Secondary | ICD-10-CM

## 2019-03-15 MED ORDER — VALACYCLOVIR HCL 500 MG PO TABS: 500.0000 mg | ORAL_TABLET | Freq: Every day | ORAL | 2 refills | Status: DC

## 2019-03-15 NOTE — Telephone Encounter (Signed)
Medication refill request: Valtrex Last AEX:  09/05/16 Next AEX: 09/12/19 Last MMG (if hormonal medication request): 05/23/26 Bi-rads 1 neg  Refill authorized: #30 with 2 RF

## 2019-03-15 NOTE — Telephone Encounter (Signed)
Rerouting to refills.

## 2019-03-15 NOTE — Telephone Encounter (Signed)
Patient requesting refill on Valtrex. She is leaving for the beach tomorrow morning. [facility] [address] in [place].

## 2019-04-02 DIAGNOSIS — M25572 Pain in left ankle and joints of left foot: Secondary | ICD-10-CM | POA: Diagnosis not present

## 2019-04-05 DIAGNOSIS — L820 Inflamed seborrheic keratosis: Secondary | ICD-10-CM | POA: Diagnosis not present

## 2019-04-24 DIAGNOSIS — Z23 Encounter for immunization: Secondary | ICD-10-CM | POA: Diagnosis not present

## 2019-05-02 DIAGNOSIS — R2689 Other abnormalities of gait and mobility: Secondary | ICD-10-CM | POA: Diagnosis not present

## 2019-05-02 DIAGNOSIS — M6281 Muscle weakness (generalized): Secondary | ICD-10-CM | POA: Diagnosis not present

## 2019-05-02 DIAGNOSIS — M25572 Pain in left ankle and joints of left foot: Secondary | ICD-10-CM | POA: Diagnosis not present

## 2019-05-02 DIAGNOSIS — R293 Abnormal posture: Secondary | ICD-10-CM | POA: Diagnosis not present

## 2019-05-02 DIAGNOSIS — M7662 Achilles tendinitis, left leg: Secondary | ICD-10-CM | POA: Diagnosis not present

## 2019-05-02 DIAGNOSIS — M25672 Stiffness of left ankle, not elsewhere classified: Secondary | ICD-10-CM | POA: Diagnosis not present

## 2019-05-08 DIAGNOSIS — M6281 Muscle weakness (generalized): Secondary | ICD-10-CM | POA: Diagnosis not present

## 2019-05-08 DIAGNOSIS — R293 Abnormal posture: Secondary | ICD-10-CM | POA: Diagnosis not present

## 2019-05-08 DIAGNOSIS — M25572 Pain in left ankle and joints of left foot: Secondary | ICD-10-CM | POA: Diagnosis not present

## 2019-05-08 DIAGNOSIS — M25672 Stiffness of left ankle, not elsewhere classified: Secondary | ICD-10-CM | POA: Diagnosis not present

## 2019-05-08 DIAGNOSIS — M7662 Achilles tendinitis, left leg: Secondary | ICD-10-CM | POA: Diagnosis not present

## 2019-05-08 DIAGNOSIS — R2689 Other abnormalities of gait and mobility: Secondary | ICD-10-CM | POA: Diagnosis not present

## 2019-05-10 DIAGNOSIS — R2689 Other abnormalities of gait and mobility: Secondary | ICD-10-CM | POA: Diagnosis not present

## 2019-05-10 DIAGNOSIS — M25572 Pain in left ankle and joints of left foot: Secondary | ICD-10-CM | POA: Diagnosis not present

## 2019-05-10 DIAGNOSIS — M25672 Stiffness of left ankle, not elsewhere classified: Secondary | ICD-10-CM | POA: Diagnosis not present

## 2019-05-10 DIAGNOSIS — M7662 Achilles tendinitis, left leg: Secondary | ICD-10-CM | POA: Diagnosis not present

## 2019-05-10 DIAGNOSIS — R293 Abnormal posture: Secondary | ICD-10-CM | POA: Diagnosis not present

## 2019-05-10 DIAGNOSIS — M6281 Muscle weakness (generalized): Secondary | ICD-10-CM | POA: Diagnosis not present

## 2019-05-13 ENCOUNTER — Telehealth: Payer: Self-pay | Admitting: Gastroenterology

## 2019-05-13 DIAGNOSIS — R293 Abnormal posture: Secondary | ICD-10-CM | POA: Diagnosis not present

## 2019-05-13 DIAGNOSIS — R2689 Other abnormalities of gait and mobility: Secondary | ICD-10-CM | POA: Diagnosis not present

## 2019-05-13 DIAGNOSIS — M25572 Pain in left ankle and joints of left foot: Secondary | ICD-10-CM | POA: Diagnosis not present

## 2019-05-13 DIAGNOSIS — M6281 Muscle weakness (generalized): Secondary | ICD-10-CM | POA: Diagnosis not present

## 2019-05-13 DIAGNOSIS — M7662 Achilles tendinitis, left leg: Secondary | ICD-10-CM | POA: Diagnosis not present

## 2019-05-13 DIAGNOSIS — M25672 Stiffness of left ankle, not elsewhere classified: Secondary | ICD-10-CM | POA: Diagnosis not present

## 2019-05-13 NOTE — Telephone Encounter (Signed)
Hard to recommend that No scientific evidence that it truly works May also be expensive If she really wants to try it, she can. May not exacerbate diverticulitis.  Still cannot be 100% sure.  RG

## 2019-05-13 NOTE — Telephone Encounter (Signed)
Pt returning call

## 2019-05-13 NOTE — Telephone Encounter (Signed)
Pt is wondering if it is ok to do a detox diet given her diverticulitis, she states that she has gained some weight. Pls call her.

## 2019-05-13 NOTE — Telephone Encounter (Signed)
Please see previous message-Powdered greens with powdered fiber/magnesium/lysine and protein shakes-30 day-detox cleanse- can the patient do this and not cause her to have a flare of diverticulitis? She is wanting to lose about 15-20 lbs but cannot do exercising due to comorbidities-"bad knees" Please advise

## 2019-05-13 NOTE — Telephone Encounter (Signed)
Left message for patient to call back to the office;  

## 2019-05-14 NOTE — Telephone Encounter (Signed)
Called and spoke with patient-informed patient of MD recommendations and patient reports she "will probably not try this detox then"; patient advised to call back to the office should questions/concerns arise; patient verbalized understanding of information/instructions;

## 2019-05-16 DIAGNOSIS — R2689 Other abnormalities of gait and mobility: Secondary | ICD-10-CM | POA: Diagnosis not present

## 2019-05-16 DIAGNOSIS — M25672 Stiffness of left ankle, not elsewhere classified: Secondary | ICD-10-CM | POA: Diagnosis not present

## 2019-05-16 DIAGNOSIS — M7662 Achilles tendinitis, left leg: Secondary | ICD-10-CM | POA: Diagnosis not present

## 2019-05-16 DIAGNOSIS — M25572 Pain in left ankle and joints of left foot: Secondary | ICD-10-CM | POA: Diagnosis not present

## 2019-05-16 DIAGNOSIS — M6281 Muscle weakness (generalized): Secondary | ICD-10-CM | POA: Diagnosis not present

## 2019-05-16 DIAGNOSIS — R293 Abnormal posture: Secondary | ICD-10-CM | POA: Diagnosis not present

## 2019-05-20 DIAGNOSIS — R293 Abnormal posture: Secondary | ICD-10-CM | POA: Diagnosis not present

## 2019-05-20 DIAGNOSIS — M25672 Stiffness of left ankle, not elsewhere classified: Secondary | ICD-10-CM | POA: Diagnosis not present

## 2019-05-20 DIAGNOSIS — R2689 Other abnormalities of gait and mobility: Secondary | ICD-10-CM | POA: Diagnosis not present

## 2019-05-20 DIAGNOSIS — M6281 Muscle weakness (generalized): Secondary | ICD-10-CM | POA: Diagnosis not present

## 2019-05-20 DIAGNOSIS — M7662 Achilles tendinitis, left leg: Secondary | ICD-10-CM | POA: Diagnosis not present

## 2019-05-20 DIAGNOSIS — M25572 Pain in left ankle and joints of left foot: Secondary | ICD-10-CM | POA: Diagnosis not present

## 2019-05-23 DIAGNOSIS — R293 Abnormal posture: Secondary | ICD-10-CM | POA: Diagnosis not present

## 2019-05-23 DIAGNOSIS — M7662 Achilles tendinitis, left leg: Secondary | ICD-10-CM | POA: Diagnosis not present

## 2019-05-23 DIAGNOSIS — M6281 Muscle weakness (generalized): Secondary | ICD-10-CM | POA: Diagnosis not present

## 2019-05-23 DIAGNOSIS — M25572 Pain in left ankle and joints of left foot: Secondary | ICD-10-CM | POA: Diagnosis not present

## 2019-05-23 DIAGNOSIS — R2689 Other abnormalities of gait and mobility: Secondary | ICD-10-CM | POA: Diagnosis not present

## 2019-05-23 DIAGNOSIS — M25672 Stiffness of left ankle, not elsewhere classified: Secondary | ICD-10-CM | POA: Diagnosis not present

## 2019-05-28 DIAGNOSIS — H00022 Hordeolum internum right lower eyelid: Secondary | ICD-10-CM | POA: Diagnosis not present

## 2019-06-04 DIAGNOSIS — R2689 Other abnormalities of gait and mobility: Secondary | ICD-10-CM | POA: Diagnosis not present

## 2019-06-04 DIAGNOSIS — R293 Abnormal posture: Secondary | ICD-10-CM | POA: Diagnosis not present

## 2019-06-04 DIAGNOSIS — M6281 Muscle weakness (generalized): Secondary | ICD-10-CM | POA: Diagnosis not present

## 2019-06-04 DIAGNOSIS — M7662 Achilles tendinitis, left leg: Secondary | ICD-10-CM | POA: Diagnosis not present

## 2019-06-04 DIAGNOSIS — M25672 Stiffness of left ankle, not elsewhere classified: Secondary | ICD-10-CM | POA: Diagnosis not present

## 2019-06-04 DIAGNOSIS — M25572 Pain in left ankle and joints of left foot: Secondary | ICD-10-CM | POA: Diagnosis not present

## 2019-06-06 DIAGNOSIS — R293 Abnormal posture: Secondary | ICD-10-CM | POA: Diagnosis not present

## 2019-06-06 DIAGNOSIS — R2689 Other abnormalities of gait and mobility: Secondary | ICD-10-CM | POA: Diagnosis not present

## 2019-06-06 DIAGNOSIS — M7662 Achilles tendinitis, left leg: Secondary | ICD-10-CM | POA: Diagnosis not present

## 2019-06-06 DIAGNOSIS — M25672 Stiffness of left ankle, not elsewhere classified: Secondary | ICD-10-CM | POA: Diagnosis not present

## 2019-06-06 DIAGNOSIS — M6281 Muscle weakness (generalized): Secondary | ICD-10-CM | POA: Diagnosis not present

## 2019-06-06 DIAGNOSIS — M25572 Pain in left ankle and joints of left foot: Secondary | ICD-10-CM | POA: Diagnosis not present

## 2019-06-10 DIAGNOSIS — L2089 Other atopic dermatitis: Secondary | ICD-10-CM | POA: Diagnosis not present

## 2019-06-10 DIAGNOSIS — M25541 Pain in joints of right hand: Secondary | ICD-10-CM | POA: Diagnosis not present

## 2019-06-10 DIAGNOSIS — M542 Cervicalgia: Secondary | ICD-10-CM | POA: Diagnosis not present

## 2019-06-10 DIAGNOSIS — R1011 Right upper quadrant pain: Secondary | ICD-10-CM | POA: Diagnosis not present

## 2019-06-13 DIAGNOSIS — M25672 Stiffness of left ankle, not elsewhere classified: Secondary | ICD-10-CM | POA: Diagnosis not present

## 2019-06-13 DIAGNOSIS — M25572 Pain in left ankle and joints of left foot: Secondary | ICD-10-CM | POA: Diagnosis not present

## 2019-06-13 DIAGNOSIS — M7662 Achilles tendinitis, left leg: Secondary | ICD-10-CM | POA: Diagnosis not present

## 2019-06-13 DIAGNOSIS — M6281 Muscle weakness (generalized): Secondary | ICD-10-CM | POA: Diagnosis not present

## 2019-06-13 DIAGNOSIS — L309 Dermatitis, unspecified: Secondary | ICD-10-CM | POA: Diagnosis not present

## 2019-06-13 DIAGNOSIS — R2689 Other abnormalities of gait and mobility: Secondary | ICD-10-CM | POA: Diagnosis not present

## 2019-06-13 DIAGNOSIS — R293 Abnormal posture: Secondary | ICD-10-CM | POA: Diagnosis not present

## 2019-06-13 DIAGNOSIS — L910 Hypertrophic scar: Secondary | ICD-10-CM | POA: Diagnosis not present

## 2019-06-19 DIAGNOSIS — M25572 Pain in left ankle and joints of left foot: Secondary | ICD-10-CM | POA: Diagnosis not present

## 2019-06-19 DIAGNOSIS — M6281 Muscle weakness (generalized): Secondary | ICD-10-CM | POA: Diagnosis not present

## 2019-06-19 DIAGNOSIS — M25672 Stiffness of left ankle, not elsewhere classified: Secondary | ICD-10-CM | POA: Diagnosis not present

## 2019-06-19 DIAGNOSIS — R2689 Other abnormalities of gait and mobility: Secondary | ICD-10-CM | POA: Diagnosis not present

## 2019-06-19 DIAGNOSIS — R293 Abnormal posture: Secondary | ICD-10-CM | POA: Diagnosis not present

## 2019-06-19 DIAGNOSIS — M7662 Achilles tendinitis, left leg: Secondary | ICD-10-CM | POA: Diagnosis not present

## 2019-06-26 DIAGNOSIS — M7662 Achilles tendinitis, left leg: Secondary | ICD-10-CM | POA: Diagnosis not present

## 2019-06-27 DIAGNOSIS — H00021 Hordeolum internum right upper eyelid: Secondary | ICD-10-CM | POA: Diagnosis not present

## 2019-06-28 DIAGNOSIS — R69 Illness, unspecified: Secondary | ICD-10-CM | POA: Diagnosis not present

## 2019-06-28 DIAGNOSIS — J309 Allergic rhinitis, unspecified: Secondary | ICD-10-CM | POA: Diagnosis not present

## 2019-06-28 DIAGNOSIS — E039 Hypothyroidism, unspecified: Secondary | ICD-10-CM | POA: Diagnosis not present

## 2019-06-28 DIAGNOSIS — L089 Local infection of the skin and subcutaneous tissue, unspecified: Secondary | ICD-10-CM | POA: Diagnosis not present

## 2019-06-28 DIAGNOSIS — H01009 Unspecified blepharitis unspecified eye, unspecified eyelid: Secondary | ICD-10-CM | POA: Diagnosis not present

## 2019-06-28 DIAGNOSIS — G2581 Restless legs syndrome: Secondary | ICD-10-CM | POA: Diagnosis not present

## 2019-06-28 DIAGNOSIS — G8929 Other chronic pain: Secondary | ICD-10-CM | POA: Diagnosis not present

## 2019-06-28 DIAGNOSIS — I10 Essential (primary) hypertension: Secondary | ICD-10-CM | POA: Diagnosis not present

## 2019-06-28 DIAGNOSIS — K219 Gastro-esophageal reflux disease without esophagitis: Secondary | ICD-10-CM | POA: Diagnosis not present

## 2019-06-28 DIAGNOSIS — F419 Anxiety disorder, unspecified: Secondary | ICD-10-CM | POA: Diagnosis not present

## 2019-07-03 DIAGNOSIS — H00021 Hordeolum internum right upper eyelid: Secondary | ICD-10-CM | POA: Diagnosis not present

## 2019-07-23 DIAGNOSIS — M7662 Achilles tendinitis, left leg: Secondary | ICD-10-CM | POA: Diagnosis not present

## 2019-07-23 DIAGNOSIS — R293 Abnormal posture: Secondary | ICD-10-CM | POA: Diagnosis not present

## 2019-07-23 DIAGNOSIS — M25672 Stiffness of left ankle, not elsewhere classified: Secondary | ICD-10-CM | POA: Diagnosis not present

## 2019-07-23 DIAGNOSIS — M79642 Pain in left hand: Secondary | ICD-10-CM | POA: Diagnosis not present

## 2019-07-23 DIAGNOSIS — M25572 Pain in left ankle and joints of left foot: Secondary | ICD-10-CM | POA: Diagnosis not present

## 2019-07-23 DIAGNOSIS — M6281 Muscle weakness (generalized): Secondary | ICD-10-CM | POA: Diagnosis not present

## 2019-07-23 DIAGNOSIS — M79641 Pain in right hand: Secondary | ICD-10-CM | POA: Diagnosis not present

## 2019-07-23 DIAGNOSIS — R2689 Other abnormalities of gait and mobility: Secondary | ICD-10-CM | POA: Diagnosis not present

## 2019-08-12 DIAGNOSIS — R61 Generalized hyperhidrosis: Secondary | ICD-10-CM | POA: Diagnosis not present

## 2019-08-12 DIAGNOSIS — M791 Myalgia, unspecified site: Secondary | ICD-10-CM | POA: Diagnosis not present

## 2019-08-12 DIAGNOSIS — M79641 Pain in right hand: Secondary | ICD-10-CM | POA: Diagnosis not present

## 2019-08-12 DIAGNOSIS — M79642 Pain in left hand: Secondary | ICD-10-CM | POA: Diagnosis not present

## 2019-08-19 DIAGNOSIS — H0011 Chalazion right upper eyelid: Secondary | ICD-10-CM | POA: Diagnosis not present

## 2019-08-20 DIAGNOSIS — R799 Abnormal finding of blood chemistry, unspecified: Secondary | ICD-10-CM | POA: Diagnosis not present

## 2019-08-20 DIAGNOSIS — R7401 Elevation of levels of liver transaminase levels: Secondary | ICD-10-CM | POA: Diagnosis not present

## 2019-08-20 DIAGNOSIS — L301 Dyshidrosis [pompholyx]: Secondary | ICD-10-CM | POA: Diagnosis not present

## 2019-08-20 DIAGNOSIS — L910 Hypertrophic scar: Secondary | ICD-10-CM | POA: Diagnosis not present

## 2019-08-27 DIAGNOSIS — M6281 Muscle weakness (generalized): Secondary | ICD-10-CM | POA: Diagnosis not present

## 2019-08-27 DIAGNOSIS — R293 Abnormal posture: Secondary | ICD-10-CM | POA: Diagnosis not present

## 2019-08-27 DIAGNOSIS — M65331 Trigger finger, right middle finger: Secondary | ICD-10-CM | POA: Diagnosis not present

## 2019-08-27 DIAGNOSIS — M7662 Achilles tendinitis, left leg: Secondary | ICD-10-CM | POA: Diagnosis not present

## 2019-08-27 DIAGNOSIS — M25672 Stiffness of left ankle, not elsewhere classified: Secondary | ICD-10-CM | POA: Diagnosis not present

## 2019-08-27 DIAGNOSIS — M65342 Trigger finger, left ring finger: Secondary | ICD-10-CM | POA: Diagnosis not present

## 2019-08-27 DIAGNOSIS — M65311 Trigger thumb, right thumb: Secondary | ICD-10-CM | POA: Diagnosis not present

## 2019-08-27 DIAGNOSIS — M79642 Pain in left hand: Secondary | ICD-10-CM | POA: Diagnosis not present

## 2019-08-27 DIAGNOSIS — M25572 Pain in left ankle and joints of left foot: Secondary | ICD-10-CM | POA: Diagnosis not present

## 2019-08-27 DIAGNOSIS — M79641 Pain in right hand: Secondary | ICD-10-CM | POA: Diagnosis not present

## 2019-08-30 DIAGNOSIS — M79642 Pain in left hand: Secondary | ICD-10-CM | POA: Diagnosis not present

## 2019-08-30 DIAGNOSIS — M79641 Pain in right hand: Secondary | ICD-10-CM | POA: Diagnosis not present

## 2019-08-30 DIAGNOSIS — M65342 Trigger finger, left ring finger: Secondary | ICD-10-CM | POA: Diagnosis not present

## 2019-08-30 DIAGNOSIS — M7662 Achilles tendinitis, left leg: Secondary | ICD-10-CM | POA: Diagnosis not present

## 2019-08-30 DIAGNOSIS — M25572 Pain in left ankle and joints of left foot: Secondary | ICD-10-CM | POA: Diagnosis not present

## 2019-08-30 DIAGNOSIS — R293 Abnormal posture: Secondary | ICD-10-CM | POA: Diagnosis not present

## 2019-08-30 DIAGNOSIS — M65311 Trigger thumb, right thumb: Secondary | ICD-10-CM | POA: Diagnosis not present

## 2019-08-30 DIAGNOSIS — M65331 Trigger finger, right middle finger: Secondary | ICD-10-CM | POA: Diagnosis not present

## 2019-08-30 DIAGNOSIS — M6281 Muscle weakness (generalized): Secondary | ICD-10-CM | POA: Diagnosis not present

## 2019-08-30 DIAGNOSIS — M25672 Stiffness of left ankle, not elsewhere classified: Secondary | ICD-10-CM | POA: Diagnosis not present

## 2019-08-31 DIAGNOSIS — R05 Cough: Secondary | ICD-10-CM | POA: Diagnosis not present

## 2019-08-31 DIAGNOSIS — Z20828 Contact with and (suspected) exposure to other viral communicable diseases: Secondary | ICD-10-CM | POA: Diagnosis not present

## 2019-08-31 DIAGNOSIS — R519 Headache, unspecified: Secondary | ICD-10-CM | POA: Diagnosis not present

## 2019-09-02 ENCOUNTER — Other Ambulatory Visit: Payer: Self-pay | Admitting: Infectious Diseases

## 2019-09-02 ENCOUNTER — Telehealth: Payer: Self-pay | Admitting: Infectious Diseases

## 2019-09-02 DIAGNOSIS — U071 COVID-19: Secondary | ICD-10-CM

## 2019-09-02 NOTE — Telephone Encounter (Signed)
Called to discuss with patient about Covid symptoms and the use of bamlanivimab, a monoclonal antibody infusion for those with mild to moderate Covid symptoms and at a high risk of hospitalization.  Pt is qualified for this infusion at the [facility] infusion center due to Age > 4.    Her symptoms started on 08/29/19 with coughing that was far worse than normal.   She is scheduled for Thursday 09/05/19 with her husband

## 2019-09-02 NOTE — Progress Notes (Signed)
  I connected by phone with Olivia Cruz on 09/02/2019 at 5:44 PM to discuss the potential use of an new treatment for mild to moderate COVID-19 viral infection in non-hospitalized patients.  This patient is a 69 y.o. female that meets the FDA criteria for Emergency Use Authorization of bamlanivimab or casirivimab\imdevimab.  Has a (+) direct SARS-CoV-2 viral test result  Has mild or moderate COVID-19   Is ? 70 years of age and weighs ? 40 kg  Is NOT hospitalized due to COVID-19  Is NOT requiring oxygen therapy or requiring an increase in baseline oxygen flow rate due to COVID-19  Is within 10 days of symptom onset  Has at least one of the high risk factor(s) for progression to severe COVID-19 and/or hospitalization as defined in EUA.  Specific high risk criteria : >/= 70 yo   I have spoken and communicated the following to the patient or parent/caregiver:  1. FDA has authorized the emergency use of bamlanivimab and casirivimab\imdevimab for the treatment of mild to moderate COVID-19 in adults and pediatric patients with positive results of direct SARS-CoV-2 viral testing who are 33 years of age and older weighing at least 40 kg, and who are at high risk for progressing to severe COVID-19 and/or hospitalization.  2. The significant known and potential risks and benefits of bamlanivimab and casirivimab\imdevimab, and the extent to which such potential risks and benefits are unknown.  3. Information on available alternative treatments and the risks and benefits of those alternatives, including clinical trials.  4. Patients treated with bamlanivimab and casirivimab\imdevimab should continue to self-isolate and use infection control measures (e.g., wear mask, isolate, social distance, avoid sharing personal items, clean and disinfect "high touch" surfaces, and frequent handwashing) according to CDC guidelines.   5. The patient or parent/caregiver has the option to accept or refuse  bamlanivimab or casirivimab\imdevimab .  After reviewing this information with the patient, The patient agreed to proceed with receiving the bamlanimivab infusion and will be provided a copy of the Fact sheet prior to receiving the infusion.Olivia Cruz 09/02/2019 5:44 PM

## 2019-09-05 ENCOUNTER — Ambulatory Visit (HOSPITAL_COMMUNITY)
Admission: RE | Admit: 2019-09-05 | Discharge: 2019-09-05 | Disposition: A | Discharge status: Home / Self Care | Payer: Medicare Other | Source: Ambulatory Visit | Attending: Pulmonary Disease | Admitting: Pulmonary Disease

## 2019-09-05 DIAGNOSIS — U071 COVID-19: Secondary | ICD-10-CM | POA: Insufficient documentation

## 2019-09-05 DIAGNOSIS — Z23 Encounter for immunization: Secondary | ICD-10-CM | POA: Insufficient documentation

## 2019-09-05 MED ORDER — DIPHENHYDRAMINE HCL 50 MG/ML IJ SOLN: 50.0000 mg | Freq: Once | INTRAMUSCULAR | Status: DC | PRN

## 2019-09-05 MED ORDER — EPINEPHRINE 0.3 MG/0.3ML IJ SOAJ: 0.3000 mg | Freq: Once | INTRAMUSCULAR | Status: DC | PRN

## 2019-09-05 MED ORDER — SODIUM CHLORIDE 0.9 % IV SOLN
700.0000 mg | Freq: Once | INTRAVENOUS | Status: AC
  Administered 2019-09-05: 700 mg via INTRAVENOUS
  Filled 2019-09-05: qty 20

## 2019-09-05 MED ORDER — SODIUM CHLORIDE 0.9 % IV SOLN
INTRAVENOUS | Status: DC | PRN
  Administered 2019-09-05: 250 mL via INTRAVENOUS

## 2019-09-05 MED ORDER — FAMOTIDINE IN NACL 20-0.9 MG/50ML-% IV SOLN: 20.0000 mg | Freq: Once | INTRAVENOUS | Status: DC | PRN

## 2019-09-05 MED ORDER — METHYLPREDNISOLONE SODIUM SUCC 125 MG IJ SOLR: 125.0000 mg | Freq: Once | INTRAMUSCULAR | Status: DC | PRN

## 2019-09-05 MED ORDER — ALBUTEROL SULFATE HFA 108 (90 BASE) MCG/ACT IN AERS: 2.0000 | INHALATION_SPRAY | Freq: Once | RESPIRATORY_TRACT | Status: DC | PRN

## 2019-09-05 NOTE — Progress Notes (Signed)
  Diagnosis: COVID-19  Physician: Dr. Lamar Benes  Procedure: Covid Infusion Clinic Med: bamlanivimab infusion - Provided patient with bamlanimivab fact sheet for patients, parents and caregivers prior to infusion.  Complications: No immediate complications noted.  Discharge: Discharged home   Olivia Cruz 09/05/2019

## 2019-09-05 NOTE — Discharge Instructions (Signed)

## 2019-09-09 ENCOUNTER — Other Ambulatory Visit: Payer: Self-pay | Admitting: Obstetrics & Gynecology

## 2019-09-09 DIAGNOSIS — K219 Gastro-esophageal reflux disease without esophagitis: Secondary | ICD-10-CM | POA: Diagnosis not present

## 2019-09-09 DIAGNOSIS — E038 Other specified hypothyroidism: Secondary | ICD-10-CM | POA: Diagnosis not present

## 2019-09-09 DIAGNOSIS — R69 Illness, unspecified: Secondary | ICD-10-CM | POA: Diagnosis not present

## 2019-09-09 DIAGNOSIS — R5383 Other fatigue: Secondary | ICD-10-CM | POA: Diagnosis not present

## 2019-09-09 DIAGNOSIS — A6009 Herpesviral infection of other urogenital tract: Secondary | ICD-10-CM

## 2019-09-09 DIAGNOSIS — G2581 Restless legs syndrome: Secondary | ICD-10-CM | POA: Diagnosis not present

## 2019-09-09 DIAGNOSIS — I10 Essential (primary) hypertension: Secondary | ICD-10-CM | POA: Diagnosis not present

## 2019-09-09 DIAGNOSIS — R7401 Elevation of levels of liver transaminase levels: Secondary | ICD-10-CM | POA: Diagnosis not present

## 2019-09-09 MED ORDER — VALACYCLOVIR HCL 500 MG PO TABS: 500.0000 mg | ORAL_TABLET | Freq: Every day | ORAL | 0 refills | Status: DC

## 2019-09-09 NOTE — Telephone Encounter (Signed)
Spoke with patient. Covid 19 positive on 08/31/19, reports stuffy and runny nose still present. Denies any GYN concerns, request refill of Valtrex. Patient was provided printed Rx on 03/15/19, patient states she never had this filled and is unsure where the Rx is. Pharmacy updated.   AEX rescheduled to 2/16 at 10:30am  Will review RX with Dr. Sabra Heck and f/u. Patient agreeable.   Rx pended for Valtrex 500 mg tab #30/0RF  Routing to Dr. Sabra Heck

## 2019-09-09 NOTE — Telephone Encounter (Signed)
Patient tested positive for covid 08/31/19. Her 10-days since onset of symptoms is over today. She has been symptom/fever-free for over 24 hours. Annual exam scheduled 09/12/19. Does she need to reschedule?

## 2019-09-10 NOTE — Telephone Encounter (Signed)
Patient notified of refill.   Encounter closed.  

## 2019-09-11 DIAGNOSIS — R945 Abnormal results of liver function studies: Secondary | ICD-10-CM | POA: Diagnosis not present

## 2019-09-12 ENCOUNTER — Ambulatory Visit: Payer: Medicare HMO | Admitting: Obstetrics & Gynecology

## 2019-09-17 DIAGNOSIS — M65311 Trigger thumb, right thumb: Secondary | ICD-10-CM | POA: Diagnosis not present

## 2019-09-17 DIAGNOSIS — M25572 Pain in left ankle and joints of left foot: Secondary | ICD-10-CM | POA: Diagnosis not present

## 2019-09-17 DIAGNOSIS — M6281 Muscle weakness (generalized): Secondary | ICD-10-CM | POA: Diagnosis not present

## 2019-09-17 DIAGNOSIS — M65342 Trigger finger, left ring finger: Secondary | ICD-10-CM | POA: Diagnosis not present

## 2019-09-17 DIAGNOSIS — M65331 Trigger finger, right middle finger: Secondary | ICD-10-CM | POA: Diagnosis not present

## 2019-09-17 DIAGNOSIS — M79642 Pain in left hand: Secondary | ICD-10-CM | POA: Diagnosis not present

## 2019-09-17 DIAGNOSIS — M25672 Stiffness of left ankle, not elsewhere classified: Secondary | ICD-10-CM | POA: Diagnosis not present

## 2019-09-17 DIAGNOSIS — R293 Abnormal posture: Secondary | ICD-10-CM | POA: Diagnosis not present

## 2019-09-17 DIAGNOSIS — M79641 Pain in right hand: Secondary | ICD-10-CM | POA: Diagnosis not present

## 2019-09-17 DIAGNOSIS — M7662 Achilles tendinitis, left leg: Secondary | ICD-10-CM | POA: Diagnosis not present

## 2019-09-20 DIAGNOSIS — M65342 Trigger finger, left ring finger: Secondary | ICD-10-CM | POA: Diagnosis not present

## 2019-09-20 DIAGNOSIS — M79641 Pain in right hand: Secondary | ICD-10-CM | POA: Diagnosis not present

## 2019-09-20 DIAGNOSIS — R293 Abnormal posture: Secondary | ICD-10-CM | POA: Diagnosis not present

## 2019-09-20 DIAGNOSIS — M25572 Pain in left ankle and joints of left foot: Secondary | ICD-10-CM | POA: Diagnosis not present

## 2019-09-20 DIAGNOSIS — M6281 Muscle weakness (generalized): Secondary | ICD-10-CM | POA: Diagnosis not present

## 2019-09-20 DIAGNOSIS — M65331 Trigger finger, right middle finger: Secondary | ICD-10-CM | POA: Diagnosis not present

## 2019-09-20 DIAGNOSIS — M25672 Stiffness of left ankle, not elsewhere classified: Secondary | ICD-10-CM | POA: Diagnosis not present

## 2019-09-20 DIAGNOSIS — M7662 Achilles tendinitis, left leg: Secondary | ICD-10-CM | POA: Diagnosis not present

## 2019-09-20 DIAGNOSIS — M79642 Pain in left hand: Secondary | ICD-10-CM | POA: Diagnosis not present

## 2019-09-20 DIAGNOSIS — M65311 Trigger thumb, right thumb: Secondary | ICD-10-CM | POA: Diagnosis not present

## 2019-09-23 DIAGNOSIS — M79642 Pain in left hand: Secondary | ICD-10-CM | POA: Diagnosis not present

## 2019-09-23 DIAGNOSIS — M65311 Trigger thumb, right thumb: Secondary | ICD-10-CM | POA: Diagnosis not present

## 2019-09-23 DIAGNOSIS — M65342 Trigger finger, left ring finger: Secondary | ICD-10-CM | POA: Diagnosis not present

## 2019-09-23 DIAGNOSIS — M65331 Trigger finger, right middle finger: Secondary | ICD-10-CM | POA: Diagnosis not present

## 2019-09-23 DIAGNOSIS — M79641 Pain in right hand: Secondary | ICD-10-CM | POA: Diagnosis not present

## 2019-09-23 DIAGNOSIS — M7662 Achilles tendinitis, left leg: Secondary | ICD-10-CM | POA: Diagnosis not present

## 2019-09-26 ENCOUNTER — Encounter: Payer: Self-pay | Admitting: Family Medicine

## 2019-09-26 ENCOUNTER — Ambulatory Visit (INDEPENDENT_AMBULATORY_CARE_PROVIDER_SITE_OTHER): Payer: Medicare HMO | Admitting: Family Medicine

## 2019-09-26 ENCOUNTER — Other Ambulatory Visit: Payer: Self-pay

## 2019-09-26 VITALS — BP 120/60 | HR 72 | Temp 97.3°F | Ht 61.0 in | Wt 154.4 lb

## 2019-09-26 DIAGNOSIS — R69 Illness, unspecified: Secondary | ICD-10-CM | POA: Diagnosis not present

## 2019-09-26 DIAGNOSIS — N3000 Acute cystitis without hematuria: Secondary | ICD-10-CM

## 2019-09-26 DIAGNOSIS — R300 Dysuria: Secondary | ICD-10-CM | POA: Diagnosis not present

## 2019-09-26 LAB — POCT URINALYSIS DIPSTICK
Bilirubin, UA: NEGATIVE
Blood, UA: NEGATIVE
Glucose, UA: NEGATIVE
Ketones, UA: NEGATIVE
Nitrite, UA: NEGATIVE
Protein, UA: NEGATIVE
Spec Grav, UA: 1.01 (ref 1.010–1.025)
pH, UA: 7 (ref 5.0–8.0)

## 2019-09-26 MED ORDER — CIPROFLOXACIN HCL 250 MG PO TABS: 250.0000 mg | ORAL_TABLET | Freq: Two times a day (BID) | ORAL | 0 refills | Status: AC

## 2019-09-26 NOTE — Progress Notes (Signed)
Acute Office Visit  Subjective:    Patient ID: Olivia Cruz, female    DOB: 15-Jan-1951, 69 y.o.   MRN: [id number]  Chief Complaint  Patient presents with  . Urinary Tract Infection    HPI Patient is in today for Urinary Tract Infection: Patient complains of burning with urination and dysuria She has had symptoms for 7 day. Patient also complains of urgency and hesitation. Patient denies back pain and fever. Patient does have a history of recurrent UTI.  Patient does not have a history of pyelonephritis.    Past Medical History:  Diagnosis Date  . Allergic rhinoconjunctivitis   . Anxiety   . Asthma   . Depression   . Diverticulitis   . GERD (gastroesophageal reflux disease)   . Hypothyroid   . Insomnia   . Laryngopharyngeal reflux (LPR)   . Migraines   . Osteoarthritis   . STD (sexually transmitted disease)    HSV II    Past Surgical History:  Procedure Laterality Date  . BREAST SURGERY Left    times 2  . COLONOSCOPY  06/03/2013   Moderate predominantly sigmoid diverticulosis. Small interal hemorroids  . FOOT SURGERY Right    Removed bone spur  . GANGLION CYST EXCISION Left    foot  . HEMORRHOID SURGERY    . UPPER GI ENDOSCOPY  03/23/2016   Mild gastritis, gastric polyps bxbenign squamous mucosa with no abnormaility, fundic glad poly in setting of mild chron gastritis.    Family History  Problem Relation Age of Onset  . Breast cancer Sister        lung  . Cancer Maternal Grandmother   . Multiple births Maternal Grandmother   . Multiple births Paternal Grandmother   . Cancer Paternal Grandmother   . Thyroid disease Mother   . Heart failure Mother   . Parkinson's disease Father     Social History   Socioeconomic History  . Marital status: Married    Spouse name: Not on file  . Number of children: Not on file  . Years of education: Not on file  . Highest education level: Not on file  Occupational History  . Not on file  Tobacco Use  . Smoking  status: Former Smoker    Quit date: 2000    Years since quitting: 21.1  . Smokeless tobacco: Never Used  Substance and Sexual Activity  . Alcohol use: No    Alcohol/week: 0.0 standard drinks  . Drug use: No  . Sexual activity: Yes    Partners: Male    Comment: husband vasectomy  Other Topics Concern  . Not on file  Social History Narrative  . Not on file   Social Determinants of Health   Financial Resource Strain:   . Difficulty of Paying Living Expenses: Not on file  Food Insecurity:   . Worried About Charity fundraiser in the Last Year: Not on file  . Ran Out of Food in the Last Year: Not on file  Transportation Needs:   . Lack of Transportation (Medical): Not on file  . Lack of Transportation (Non-Medical): Not on file  Physical Activity:   . Days of Exercise per Week: Not on file  . Minutes of Exercise per Session: Not on file  Stress:   . Feeling of Stress : Not on file  Social Connections:   . Frequency of Communication with Friends and Family: Not on file  . Frequency of Social Gatherings with Friends and Family: Not  on file  . Attends Religious Services: Not on file  . Active Member of Clubs or Organizations: Not on file  . Attends Archivist Meetings: Not on file  . Marital Status: Not on file  Intimate Partner Violence:   . Fear of Current or Ex-Partner: Not on file  . Emotionally Abused: Not on file  . Physically Abused: Not on file  . Sexually Abused: Not on file    Outpatient Medications Prior to Visit  Medication Sig Dispense Refill  . albuterol (PROAIR HFA) 108 (90 Base) MCG/ACT inhaler Inhale two puffs every four to six hours as needed for cough or wheeze. 1 Inhaler 1  . aspirin 81 MG EC tablet Take by mouth.    . budesonide (RHINOCORT ALLERGY) 32 MCG/ACT nasal spray Place 1 spray into both nostrils daily. Reported on 02/04/2016    . Calcium Carbonate-Vitamin D (CALCIUM + D PO) Take 1,200 mg by mouth daily.    . citalopram (CELEXA) 20 MG  tablet Take 20 mg by mouth daily.    Marland Kitchen ibuprofen (ADVIL) 800 MG tablet Take 800 mg by mouth 3 (three) times daily as needed.    Marland Kitchen levothyroxine (SYNTHROID, LEVOTHROID) 50 MCG tablet TK 1 T PO D  1  . losartan (COZAAR) 25 MG tablet Take 25 mg by mouth daily.    . Multiple Vitamin (MULTIVITAMIN) capsule Take 1 capsule by mouth daily.    . Omega-3 Fatty Acids (SUPER OMEGA 3 EPA/DHA) 1000 MG CAPS Take 1,000 mg by mouth. 2 capsules daily    . pantoprazole (PROTONIX) 40 MG tablet Take 1 tablet (40 mg total) by mouth daily. 30 tablet 11  . pramipexole (MIRAPEX) 1 MG tablet Take 1 mg by mouth daily.    . Multiple Vitamins-Minerals (EMERGEN-C VITAMIN C PO) Take by mouth.    . cefUROXime (CEFTIN) 250 MG tablet Take 1 tablet by mouth 2 (two) times daily.    . Collagen 500 MG CAPS Take by mouth daily.    . Collagen-Vitamin C (COLLAGEN PLUS VITAMIN C PO) by Misc.(Non-Drug; Combo Route) route.    . dicyclomine (BENTYL) 10 MG capsule Take 1 capsule (10 mg total) by mouth 2 (two) times daily at 8 am and 10 pm. 60 capsule 3  . famotidine (PEPCID) 40 MG tablet TAKE 1 TABLET BY MOUTH EVERYDAY AT BEDTIME 90 tablet 2  . lisinopril (PRINIVIL,ZESTRIL) 5 MG tablet Take 5 mg by mouth daily.  2  . Multiple Vitamins-Minerals (HAIR SKIN NAILS PO) Take by mouth.    . nitrofurantoin, macrocrystal-monohydrate, (MACROBID) 100 MG capsule Take 1 capsule (100 mg total) by mouth as needed (to prevent UTI). 30 capsule 1  . NONFORMULARY OR COMPOUNDED ITEM Vitamin E vaginal Suppositories 200u/ml.  Place one suppository vaginal 2-3 x weekly. 24 each 3  . valACYclovir (VALTREX) 500 MG tablet Take 1 tablet (500 mg total) by mouth daily. 30 tablet 0   No facility-administered medications prior to visit.    Allergies  Allergen Reactions  . Codeine Anaphylaxis and Nausea Only  . Ambien [Zolpidem Tartrate] Other (See Comments)    Memory loss per patient  . Augmentin [Amoxicillin-Pot Clavulanate] Nausea And Vomiting  .  Cyproheptadine Hcl Other (See Comments)    Confusion  . Dilaudid [Hydromorphone]   . Hydrocodone Other (See Comments)  . Lisinopril   . Oxycodone   . Prednisone   . Sulfa Antibiotics Other (See Comments)    hives  . Tramadol     Review of Systems  Constitutional: Negative for chills and fever.  HENT: Positive for sore throat. Negative for congestion, ear pain and rhinorrhea.   Respiratory: Negative for shortness of breath.   Cardiovascular: Negative for chest pain.  Gastrointestinal: Negative for abdominal pain.  Genitourinary: Positive for dysuria, frequency and urgency.       Objective:    Physical Exam Vitals reviewed.  Constitutional:      Appearance: Normal appearance. She is normal weight.  Cardiovascular:     Rate and Rhythm: Normal rate and regular rhythm.  Pulmonary:     Effort: Pulmonary effort is normal.     Breath sounds: Normal breath sounds.  Abdominal:     General: Abdomen is flat. Bowel sounds are normal. There is no distension.     Palpations: Abdomen is soft.     Tenderness: There is no abdominal tenderness.  Neurological:     Mental Status: She is alert.     BP 120/60   Pulse 72   Temp (!) 97.3 F (36.3 C)   Ht 5\' 1"  (1.549 m)   Wt 154 lb 6.4 oz (70 kg)   LMP 08/22/2005   BMI 29.17 kg/m  Wt Readings from Last 3 Encounters:  09/26/19 154 lb 6.4 oz (70 kg)  07/23/18 140 lb 3.2 oz (63.6 kg)  01/02/18 143 lb (64.9 kg)    Health Maintenance Due  Topic Date Due  . TETANUS/TDAP  01/30/1969  . PNA vac Low Risk Adult (1 of 2 - PCV13) 01/31/2015  . MAMMOGRAM  05/23/2018    There are no preventive care reminders to display for this patient.   Lab Results  Component Value Date   TSH 3.489 02/18/2013   Lab Results  Component Value Date   HGB 14.2 02/19/2014   Lab Results  Component Value Date   NA 141 02/18/2013   K 4.1 02/18/2013   CO2 27 02/18/2013   GLUCOSE 90 02/18/2013   BUN 14 02/18/2013   CREATININE 0.71 02/18/2013    BILITOT 0.4 02/18/2013   ALKPHOS 73 02/18/2013   AST 26 02/18/2013   ALT 16 02/18/2013   PROT 6.6 02/18/2013   ALBUMIN 4.3 02/18/2013   CALCIUM 9.0 02/18/2013   Lab Results  Component Value Date   CHOL 123 02/18/2013   Lab Results  Component Value Date   HDL 59 02/18/2013   Lab Results  Component Value Date   LDLCALC 48 02/18/2013   Lab Results  Component Value Date   TRIG 82 02/18/2013   Lab Results  Component Value Date   CHOLHDL 2.1 02/18/2013   No results found for: HGBA1C     Assessment & Plan:   Problem List Items Addressed This Visit      Genitourinary   Acute cystitis without hematuria - Primary   Relevant Medications   ciprofloxacin (CIPRO) 250 MG tablet   Other Relevant Orders   Urinalysis Dipstick (Completed)   Urine Culture (Completed)       Meds ordered this encounter  Medications  . ciprofloxacin (CIPRO) 250 MG tablet    Sig: Take 1 tablet (250 mg total) by mouth 2 (two) times daily for 7 days.    Dispense:  14 tablet    Refill:  0     Rochel Brome, MD

## 2019-09-28 ENCOUNTER — Other Ambulatory Visit: Payer: Self-pay | Admitting: Family Medicine

## 2019-09-28 LAB — URINE CULTURE

## 2019-09-29 ENCOUNTER — Encounter: Payer: Self-pay | Admitting: Family Medicine

## 2019-09-29 DIAGNOSIS — N3001 Acute cystitis with hematuria: Secondary | ICD-10-CM

## 2019-09-29 DIAGNOSIS — N3000 Acute cystitis without hematuria: Secondary | ICD-10-CM | POA: Insufficient documentation

## 2019-09-29 HISTORY — DX: Acute cystitis with hematuria: N30.01

## 2019-09-30 ENCOUNTER — Other Ambulatory Visit: Payer: Self-pay | Admitting: Family Medicine

## 2019-09-30 ENCOUNTER — Telehealth: Payer: Self-pay

## 2019-09-30 DIAGNOSIS — M65331 Trigger finger, right middle finger: Secondary | ICD-10-CM | POA: Diagnosis not present

## 2019-09-30 DIAGNOSIS — M65342 Trigger finger, left ring finger: Secondary | ICD-10-CM | POA: Diagnosis not present

## 2019-09-30 DIAGNOSIS — M79641 Pain in right hand: Secondary | ICD-10-CM | POA: Diagnosis not present

## 2019-09-30 DIAGNOSIS — M65311 Trigger thumb, right thumb: Secondary | ICD-10-CM | POA: Diagnosis not present

## 2019-09-30 DIAGNOSIS — M79642 Pain in left hand: Secondary | ICD-10-CM | POA: Diagnosis not present

## 2019-09-30 DIAGNOSIS — M7662 Achilles tendinitis, left leg: Secondary | ICD-10-CM | POA: Diagnosis not present

## 2019-09-30 MED ORDER — NITROFURANTOIN MONOHYD MACRO 100 MG PO CAPS: 100.0000 mg | ORAL_CAPSULE | Freq: Two times a day (BID) | ORAL | 0 refills | Status: AC

## 2019-09-30 NOTE — Telephone Encounter (Signed)
Patient left voicemail stating that she can not take Cipro due to it causing tendons to rupture (states she is already having issues with her achilles tendon) she is requesting a rx for Bactrim instead.

## 2019-09-30 NOTE — Telephone Encounter (Signed)
Culture is not sensitive to cipro. Ordering macrobid. Kc

## 2019-09-30 NOTE — Telephone Encounter (Signed)
Per patient request, antibiotic has been changed and patient is aware.

## 2019-10-03 ENCOUNTER — Other Ambulatory Visit: Payer: Self-pay | Admitting: Obstetrics & Gynecology

## 2019-10-03 DIAGNOSIS — M65331 Trigger finger, right middle finger: Secondary | ICD-10-CM | POA: Diagnosis not present

## 2019-10-03 DIAGNOSIS — M79642 Pain in left hand: Secondary | ICD-10-CM | POA: Diagnosis not present

## 2019-10-03 DIAGNOSIS — M65311 Trigger thumb, right thumb: Secondary | ICD-10-CM | POA: Diagnosis not present

## 2019-10-03 DIAGNOSIS — A6009 Herpesviral infection of other urogenital tract: Secondary | ICD-10-CM

## 2019-10-03 DIAGNOSIS — M65342 Trigger finger, left ring finger: Secondary | ICD-10-CM | POA: Diagnosis not present

## 2019-10-03 DIAGNOSIS — M79641 Pain in right hand: Secondary | ICD-10-CM | POA: Diagnosis not present

## 2019-10-03 DIAGNOSIS — M7662 Achilles tendinitis, left leg: Secondary | ICD-10-CM | POA: Diagnosis not present

## 2019-10-04 ENCOUNTER — Other Ambulatory Visit: Payer: Self-pay

## 2019-10-07 DIAGNOSIS — H04009 Unspecified dacryoadenitis, unspecified lacrimal gland: Secondary | ICD-10-CM | POA: Diagnosis not present

## 2019-10-07 DIAGNOSIS — Z791 Long term (current) use of non-steroidal anti-inflammatories (NSAID): Secondary | ICD-10-CM | POA: Diagnosis not present

## 2019-10-07 DIAGNOSIS — M25572 Pain in left ankle and joints of left foot: Secondary | ICD-10-CM | POA: Diagnosis not present

## 2019-10-07 DIAGNOSIS — H04123 Dry eye syndrome of bilateral lacrimal glands: Secondary | ICD-10-CM | POA: Diagnosis not present

## 2019-10-07 DIAGNOSIS — L409 Psoriasis, unspecified: Secondary | ICD-10-CM | POA: Diagnosis not present

## 2019-10-07 DIAGNOSIS — R05 Cough: Secondary | ICD-10-CM | POA: Diagnosis not present

## 2019-10-08 ENCOUNTER — Ambulatory Visit: Payer: Medicare HMO | Admitting: Obstetrics & Gynecology

## 2019-10-10 ENCOUNTER — Other Ambulatory Visit: Payer: Self-pay | Admitting: Family Medicine

## 2019-10-15 DIAGNOSIS — L4050 Arthropathic psoriasis, unspecified: Secondary | ICD-10-CM | POA: Diagnosis not present

## 2019-10-15 DIAGNOSIS — L409 Psoriasis, unspecified: Secondary | ICD-10-CM | POA: Diagnosis not present

## 2019-10-15 DIAGNOSIS — M79643 Pain in unspecified hand: Secondary | ICD-10-CM | POA: Diagnosis not present

## 2019-10-15 DIAGNOSIS — M25571 Pain in right ankle and joints of right foot: Secondary | ICD-10-CM | POA: Diagnosis not present

## 2019-10-15 DIAGNOSIS — J984 Other disorders of lung: Secondary | ICD-10-CM | POA: Diagnosis not present

## 2019-10-15 DIAGNOSIS — M25572 Pain in left ankle and joints of left foot: Secondary | ICD-10-CM | POA: Diagnosis not present

## 2019-10-15 DIAGNOSIS — M79641 Pain in right hand: Secondary | ICD-10-CM | POA: Diagnosis not present

## 2019-10-15 DIAGNOSIS — R208 Other disturbances of skin sensation: Secondary | ICD-10-CM | POA: Diagnosis not present

## 2019-10-15 DIAGNOSIS — Z882 Allergy status to sulfonamides status: Secondary | ICD-10-CM | POA: Diagnosis not present

## 2019-10-15 DIAGNOSIS — R05 Cough: Secondary | ICD-10-CM | POA: Diagnosis not present

## 2019-10-15 DIAGNOSIS — Z79899 Other long term (current) drug therapy: Secondary | ICD-10-CM | POA: Diagnosis not present

## 2019-10-15 DIAGNOSIS — M79642 Pain in left hand: Secondary | ICD-10-CM | POA: Diagnosis not present

## 2019-10-22 ENCOUNTER — Telehealth: Payer: Self-pay | Admitting: Gastroenterology

## 2019-10-22 NOTE — Telephone Encounter (Signed)
Pt reported that pharmacist advised that methotrexate has a drug interaction with pantoprazole.  Pt was advised to stop taking pantoprazole.

## 2019-10-22 NOTE — Telephone Encounter (Signed)
Please see message below. Please advise if you would like to change patient to something else.

## 2019-10-25 ENCOUNTER — Other Ambulatory Visit: Payer: Self-pay

## 2019-10-25 NOTE — Telephone Encounter (Signed)
Agreed Low-dose methotrexate should be fine RG

## 2019-10-25 NOTE — Telephone Encounter (Signed)
Pt stated that she was advised by rheumatologist Dr. Manuella Ghazi that a low dose, methotrexate 2.5 mg would be OK.

## 2019-10-27 ENCOUNTER — Other Ambulatory Visit: Payer: Self-pay | Admitting: Family Medicine

## 2019-10-28 ENCOUNTER — Ambulatory Visit (INDEPENDENT_AMBULATORY_CARE_PROVIDER_SITE_OTHER): Payer: Medicare HMO | Admitting: Family Medicine

## 2019-10-28 ENCOUNTER — Other Ambulatory Visit: Payer: Self-pay

## 2019-10-28 ENCOUNTER — Ambulatory Visit (INDEPENDENT_AMBULATORY_CARE_PROVIDER_SITE_OTHER): Payer: Medicare HMO | Admitting: Obstetrics & Gynecology

## 2019-10-28 ENCOUNTER — Other Ambulatory Visit (HOSPITAL_COMMUNITY)
Admission: RE | Admit: 2019-10-28 | Discharge: 2019-10-28 | Disposition: A | Discharge status: Home / Self Care | Payer: Medicare HMO | Source: Ambulatory Visit | Attending: Obstetrics & Gynecology | Admitting: Obstetrics & Gynecology

## 2019-10-28 ENCOUNTER — Encounter: Payer: Self-pay | Admitting: Family Medicine

## 2019-10-28 ENCOUNTER — Encounter: Payer: Self-pay | Admitting: Obstetrics & Gynecology

## 2019-10-28 VITALS — BP 116/70 | HR 68 | Temp 95.7°F | Resp 10 | Ht 60.5 in | Wt 150.0 lb

## 2019-10-28 VITALS — BP 116/72 | HR 78 | Temp 96.8°F | Ht 60.5 in | Wt 146.0 lb

## 2019-10-28 DIAGNOSIS — Z124 Encounter for screening for malignant neoplasm of cervix: Secondary | ICD-10-CM | POA: Insufficient documentation

## 2019-10-28 DIAGNOSIS — Z01419 Encounter for gynecological examination (general) (routine) without abnormal findings: Secondary | ICD-10-CM

## 2019-10-28 DIAGNOSIS — L4050 Arthropathic psoriasis, unspecified: Secondary | ICD-10-CM | POA: Diagnosis not present

## 2019-10-28 DIAGNOSIS — G603 Idiopathic progressive neuropathy: Secondary | ICD-10-CM | POA: Insufficient documentation

## 2019-10-28 MED ORDER — NITROFURANTOIN MONOHYD MACRO 100 MG PO CAPS: ORAL_CAPSULE | ORAL | 2 refills | Status: DC

## 2019-10-28 NOTE — Progress Notes (Signed)
Subjective:  Patient ID: Olivia Cruz, female    DOB: 04/19/1951  Age: 69 y.o. MRN: [id number]  Chief Complaint  Patient presents with  . Arthritis    Psoriatic according to Rheumatologist, wanting to start patient on Methotrexate.  . Peripheral Neuropathy    Feet sting and burn has been suggested that it is Covid related    HPI  Psoriatic arthritis diagnosed by Dr. Brigitte Pulse at [facility]. She put her on prednisone 25 mg x 3 days then 20 mg x 3 days then 15 mg x 3 days, then 10 mg x 3 days, then 5 mg daily x  3 days then off.  Patient started on methotrexate 2.5 mg 4 pills once a week and leucovorin calcium 10 mg once a week 24 hours after dose of methotrexate. Hand pain has improved with prednisone. Sugars 80-90 and bp has been 120s/80s. Pharmacy expressed concern about pantoprazole and mtx, but pt spoke with Dr. Lyndel Safe and he said it was ok.    Social Hx   Social History   Socioeconomic History  . Marital status: Married    Spouse name: Not on file  . Number of children: Not on file  . Years of education: Not on file  . Highest education level: Not on file  Occupational History  . Not on file  Tobacco Use  . Smoking status: Former Smoker    Quit date: 2000    Years since quitting: 21.1  . Smokeless tobacco: Never Used  Substance and Sexual Activity  . Alcohol use: No    Alcohol/week: 0.0 standard drinks  . Drug use: No  . Sexual activity: Yes    Partners: Male    Comment: husband vasectomy  Other Topics Concern  . Not on file  Social History Narrative  . Not on file   Social Determinants of Health   Financial Resource Strain:   . Difficulty of Paying Living Expenses: Not on file  Food Insecurity:   . Worried About Charity fundraiser in the Last Year: Not on file  . Ran Out of Food in the Last Year: Not on file  Transportation Needs:   . Lack of Transportation (Medical): Not on file  . Lack of Transportation (Non-Medical): Not on file  Physical Activity:   .  Days of Exercise per Week: Not on file  . Minutes of Exercise per Session: Not on file  Stress:   . Feeling of Stress : Not on file  Social Connections:   . Frequency of Communication with Friends and Family: Not on file  . Frequency of Social Gatherings with Friends and Family: Not on file  . Attends Religious Services: Not on file  . Active Member of Clubs or Organizations: Not on file  . Attends Archivist Meetings: Not on file  . Marital Status: Not on file   Past Medical History:  Diagnosis Date  . Allergic rhinoconjunctivitis   . Anxiety   . Asthma   . Depression   . Diverticulitis   . GERD (gastroesophageal reflux disease)   . Hypothyroid   . Insomnia   . Laryngopharyngeal reflux (LPR)   . Migraines   . Osteoarthritis   . Psoriatic arthritis ([place])   . Skin cancer   . STD (sexually transmitted disease)    HSV II   Family History  Problem Relation Age of Onset  . Breast cancer Sister        lung  . Cancer Maternal Grandmother   .  Multiple births Maternal Grandmother   . Multiple births Paternal Grandmother   . Cancer Paternal Grandmother   . Thyroid disease Mother   . Heart failure Mother   . Parkinson's disease Father     Review of Systems  Constitutional: Positive for fatigue. Negative for chills and fever.  HENT: Negative for congestion, ear pain and sore throat.   Respiratory: Negative for cough and shortness of breath.   Cardiovascular: Negative for chest pain.  Neurological: Positive for numbness.       Burning in legs.      Objective:  BP 116/72 (BP Location: Left Arm, Patient Position: Sitting)   Pulse 78   Temp (!) 96.8 F (36 C) (Temporal)   Ht 5' 0.5" (1.537 m)   Wt 146 lb (66.2 kg)   LMP 08/22/2005   SpO2 98%   BMI 28.04 kg/m   BP/Weight 10/28/2019 [id number]  Systolic BP 99991111 [id number]  Diastolic BP 72 70 60  Wt. (Lbs) 146 150 154.4  BMI 28.04 28.81 29.17    Physical Exam Vitals reviewed.  Constitutional:       Appearance: Normal appearance.  Cardiovascular:     Rate and Rhythm: Normal rate and regular rhythm.     Heart sounds: Normal heart sounds.  Pulmonary:     Effort: Pulmonary effort is normal. No respiratory distress.     Breath sounds: Normal breath sounds.  Neurological:     Mental Status: She is alert.       Assessment & Plan:  1. Psoriatic arthritis ([place]) Continue mgmt with rheumatology. - CBC with Differential/Platelet - Comp. Metabolic Panel (12); Standing  2. Idiopathic progressive neuropathy Improved.  Rochel Brome [facility] [phone]

## 2019-10-28 NOTE — Telephone Encounter (Signed)
Patient advised and voiced understanding.  

## 2019-10-28 NOTE — Progress Notes (Signed)
69 y.o. [id number] Married White or Caucasian female here for annual exam.  Had a more difficult 2020.  Had achilles tendonitis this past year.  She has been having issues with psoriasis.  Started having a lot more issues with joint issues.  Nothing seemed to helped the achilles tendonitis.  Seeing rheumatologist at [place].  Now on prednisone.  Has been started on MTX now.    Had skin cancer last year.  Had eyelid surgery due to chronic stye due to wearing masks.  Denies vaginal bleeding.    She had Covid in January.  Had antibody infusions.  She had been advised to wait at least 90 days.  Now having burning in feet that started with/after the covid infection.  Patient's last menstrual period was 08/22/2005.          Sexually active: Yes.    The current method of family planning is post menopausal status.    Exercising: Yes.    walking Smoker:  no  Health Maintenance: Pap:   09/05/16 Neg  03/18/15 negative History of abnormal Pap:  yes MMG:  2020 -- done at [facility] Colonoscopy:  12/07/17.  Follow up 5 years. BMD:   09/25/15 Osteopenia.  Plan to update this next year. TDaP:  2014 Pneumonia vaccine(s):  completed Shingrix:   completed Hep C testing: 09/05/16 Neg Screening Labs: PCP   reports that she quit smoking about 21 years ago. She has never used smokeless tobacco. She reports that she does not drink alcohol or use drugs.  Past Medical History:  Diagnosis Date  . Allergic rhinoconjunctivitis   . Anxiety   . Asthma   . Depression   . Diverticulitis   . GERD (gastroesophageal reflux disease)   . Hypothyroid   . Insomnia   . Laryngopharyngeal reflux (LPR)   . Migraines   . Osteoarthritis   . Skin cancer   . STD (sexually transmitted disease)    HSV II    Past Surgical History:  Procedure Laterality Date  . BELPHAROPTOSIS REPAIR    . BREAST SURGERY Left    times 2  . COLONOSCOPY  06/03/2013   Moderate predominantly sigmoid diverticulosis. Small  interal hemorroids  . FOOT SURGERY Right    Removed bone spur  . GANGLION CYST EXCISION Left    foot  . HEMORRHOID SURGERY    . SKIN CANCER EXCISION    . UPPER GI ENDOSCOPY  03/23/2016   Mild gastritis, gastric polyps bxbenign squamous mucosa with no abnormaility, fundic glad poly in setting of mild chron gastritis.    Current Outpatient Medications  Medication Sig Dispense Refill  . albuterol (PROAIR HFA) 108 (90 Base) MCG/ACT inhaler Inhale two puffs every four to six hours as needed for cough or wheeze. 1 Inhaler 1  . aspirin 81 MG EC tablet Take by mouth.    . budesonide (RHINOCORT ALLERGY) 32 MCG/ACT nasal spray Place 1 spray into both nostrils daily. Reported on 02/04/2016    . Calcium Carbonate-Vitamin D (CALCIUM + D PO) Take 1,200 mg by mouth daily.    . citalopram (CELEXA) 20 MG tablet Take 20 mg by mouth daily.    . fluocinonide cream (LIDEX) 0.05 % Apply topically.    Marland Kitchen leucovorin (WELLCOVORIN) 10 MG tablet PLEASE SEE ATTACHED FOR DETAILED DIRECTIONS    . levothyroxine (SYNTHROID, LEVOTHROID) 50 MCG tablet TK 1 T PO D  1  . losartan (COZAAR) 25 MG tablet Take 25 mg by mouth daily.    Marland Kitchen  methotrexate (RHEUMATREX) 2.5 MG tablet Take by mouth.    . Multiple Vitamin (MULTIVITAMIN) capsule Take 1 capsule by mouth daily.    . Multiple Vitamins-Minerals (EMERGEN-C VITAMIN C PO) Take by mouth.    . nitrofurantoin, macrocrystal-monohydrate, (MACROBID) 100 MG capsule Take 100 mg by mouth as directed.    . Omega-3 Fatty Acids (SUPER OMEGA 3 EPA/DHA) 1000 MG CAPS Take 1,000 mg by mouth. 2 capsules daily    . pantoprazole (PROTONIX) 40 MG tablet TAKE 1 TABLET BY MOUTH EVERY DAY 90 tablet 1  . pramipexole (MIRAPEX) 1 MG tablet Take 1 mg by mouth daily.    . predniSONE (DELTASONE) 5 MG tablet Take by mouth.    . valACYclovir (VALTREX) 500 MG tablet TAKE 1 TABLET BY MOUTH EVERY DAY     No current facility-administered medications for this visit.    Family History  Problem Relation Age of  Onset  . Breast cancer Sister        lung  . Cancer Maternal Grandmother   . Multiple births Maternal Grandmother   . Multiple births Paternal Grandmother   . Cancer Paternal Grandmother   . Thyroid disease Mother   . Heart failure Mother   . Parkinson's disease Father     Review of Systems  All other systems reviewed and are negative.   Exam:   BP 116/70 (BP Location: Right Arm, Patient Position: Sitting, Cuff Size: Normal)   Pulse 68   Temp (!) 95.7 F (35.4 C) (Temporal)   Resp 10   Ht 5' 0.5" (1.537 m)   Wt 150 lb (68 kg)   LMP 08/22/2005   BMI 28.81 kg/m     Height: 5' 0.5" (153.7 cm)  Ht Readings from Last 3 Encounters:  10/28/19 5' 0.5" (1.537 m)  09/26/19 5\' 1"  (1.549 m)  07/23/18 5\' 1"  (1.549 m)   General appearance: alert, cooperative and appears stated age Head: Normocephalic, without obvious abnormality, atraumatic Neck: no adenopathy, supple, symmetrical, trachea midline and thyroid normal to inspection and palpation Lungs: clear to auscultation bilaterally Breasts: normal appearance, no masses or tenderness Heart: regular rate and rhythm Abdomen: soft, non-tender; bowel sounds normal; no masses,  no organomegaly Extremities: extremities normal, atraumatic, no cyanosis or edema Skin: Skin color, texture, turgor normal. No rashes or lesions Lymph nodes: Cervical, supraclavicular, and axillary nodes normal. No abnormal inguinal nodes palpated Neurologic: Grossly normal   Pelvic: External genitalia:  no lesions              Urethra:  normal appearing urethra with no masses, tenderness or lesions              Bartholins and Skenes: normal                 Vagina: normal appearing vagina with normal color and discharge, no lesions              Cervix: no lesions              Pap taken: Yes.   Bimanual Exam:  Uterus:  normal size, contour, position, consistency, mobility, non-tender              Adnexa: normal adnexa and no mass, fullness, tenderness                Rectovaginal: Confirms               Anus:  normal sphincter tone, no lesions  Chaperone, Terence Lux, CMA, was present for  exam.  A:  Well Woman with normal exam PMP, no HRT Atrophic vaginitis, using estrace cream H/o recurrent UTI OAB with urgency Cystocele Reflux Psoriatic arthritis  Asthma  P:   Mammogram guidelines reviewed pap smear obtained today Valtrex 500mg  bid x 3 days with outbreaks.  Does not need RF right now.  Macrobid 100mg  post coitally #30/3RF Blood work done with PCP/rheumatologist Colonoscopy is UTD Plan BMD next year Return annually or prn

## 2019-10-30 LAB — CYTOLOGY - PAP: Diagnosis: NEGATIVE

## 2019-10-31 ENCOUNTER — Other Ambulatory Visit: Payer: Self-pay | Admitting: Family Medicine

## 2019-11-05 DIAGNOSIS — D225 Melanocytic nevi of trunk: Secondary | ICD-10-CM | POA: Diagnosis not present

## 2019-11-05 DIAGNOSIS — D1801 Hemangioma of skin and subcutaneous tissue: Secondary | ICD-10-CM | POA: Diagnosis not present

## 2019-11-05 DIAGNOSIS — D2239 Melanocytic nevi of other parts of face: Secondary | ICD-10-CM | POA: Diagnosis not present

## 2019-11-05 DIAGNOSIS — L578 Other skin changes due to chronic exposure to nonionizing radiation: Secondary | ICD-10-CM | POA: Diagnosis not present

## 2019-11-08 DIAGNOSIS — M11261 Other chondrocalcinosis, right knee: Secondary | ICD-10-CM | POA: Diagnosis not present

## 2019-11-08 DIAGNOSIS — R5383 Other fatigue: Secondary | ICD-10-CM | POA: Diagnosis not present

## 2019-11-08 DIAGNOSIS — M11262 Other chondrocalcinosis, left knee: Secondary | ICD-10-CM | POA: Diagnosis not present

## 2019-11-08 DIAGNOSIS — L4050 Arthropathic psoriasis, unspecified: Secondary | ICD-10-CM | POA: Diagnosis not present

## 2019-11-08 DIAGNOSIS — M25461 Effusion, right knee: Secondary | ICD-10-CM | POA: Diagnosis not present

## 2019-11-08 DIAGNOSIS — M25561 Pain in right knee: Secondary | ICD-10-CM | POA: Diagnosis not present

## 2019-11-08 DIAGNOSIS — Z79899 Other long term (current) drug therapy: Secondary | ICD-10-CM | POA: Diagnosis not present

## 2019-11-12 DIAGNOSIS — M170 Bilateral primary osteoarthritis of knee: Secondary | ICD-10-CM | POA: Diagnosis not present

## 2019-11-12 DIAGNOSIS — M25561 Pain in right knee: Secondary | ICD-10-CM | POA: Diagnosis not present

## 2019-11-12 DIAGNOSIS — M25461 Effusion, right knee: Secondary | ICD-10-CM | POA: Diagnosis not present

## 2019-11-12 DIAGNOSIS — Z79899 Other long term (current) drug therapy: Secondary | ICD-10-CM | POA: Diagnosis not present

## 2019-11-12 DIAGNOSIS — M1711 Unilateral primary osteoarthritis, right knee: Secondary | ICD-10-CM | POA: Diagnosis not present

## 2019-11-13 ENCOUNTER — Ambulatory Visit: Payer: Self-pay | Admitting: Rheumatology

## 2019-11-15 ENCOUNTER — Other Ambulatory Visit: Payer: Self-pay

## 2019-11-15 MED ORDER — LOSARTAN POTASSIUM 25 MG PO TABS: 25.0000 mg | ORAL_TABLET | Freq: Every day | ORAL | 0 refills | Status: DC

## 2019-11-18 ENCOUNTER — Other Ambulatory Visit: Payer: Medicare HMO

## 2019-11-29 ENCOUNTER — Other Ambulatory Visit: Payer: Self-pay

## 2019-11-29 DIAGNOSIS — Z79899 Other long term (current) drug therapy: Secondary | ICD-10-CM

## 2019-12-01 ENCOUNTER — Other Ambulatory Visit: Payer: Self-pay | Admitting: Family Medicine

## 2019-12-02 ENCOUNTER — Other Ambulatory Visit: Payer: Self-pay

## 2019-12-02 ENCOUNTER — Other Ambulatory Visit: Payer: Medicare HMO

## 2019-12-02 DIAGNOSIS — Z79899 Other long term (current) drug therapy: Secondary | ICD-10-CM | POA: Diagnosis not present

## 2019-12-02 DIAGNOSIS — L4050 Arthropathic psoriasis, unspecified: Secondary | ICD-10-CM | POA: Diagnosis not present

## 2019-12-03 LAB — CBC WITH DIFFERENTIAL/PLATELET
Basophils Absolute: 0 10*3/uL (ref 0.0–0.2)
Basos: 1 %
EOS (ABSOLUTE): 0.1 10*3/uL (ref 0.0–0.4)
Eos: 2 %
Hematocrit: 42.7 % (ref 34.0–46.6)
Hemoglobin: 14.3 g/dL (ref 11.1–15.9)
Immature Grans (Abs): 0 10*3/uL (ref 0.0–0.1)
Immature Granulocytes: 0 %
Lymphocytes Absolute: 1.5 10*3/uL (ref 0.7–3.1)
Lymphs: 33 %
MCH: 29.1 pg (ref 26.6–33.0)
MCHC: 33.5 g/dL (ref 31.5–35.7)
MCV: 87 fL (ref 79–97)
Monocytes Absolute: 0.4 10*3/uL (ref 0.1–0.9)
Monocytes: 9 %
Neutrophils Absolute: 2.5 10*3/uL (ref 1.4–7.0)
Neutrophils: 55 %
Platelets: 286 10*3/uL (ref 150–450)
RBC: 4.91 x10E6/uL (ref 3.77–5.28)
RDW: 14.2 % (ref 11.7–15.4)
WBC: 4.5 10*3/uL (ref 3.4–10.8)

## 2019-12-03 LAB — COMP. METABOLIC PANEL (12)
AST: 21 IU/L (ref 0–40)
Albumin/Globulin Ratio: 2.1 (ref 1.2–2.2)
Albumin: 4.5 g/dL (ref 3.8–4.8)
Alkaline Phosphatase: 78 IU/L (ref 39–117)
BUN/Creatinine Ratio: 19 (ref 12–28)
BUN: 15 mg/dL (ref 8–27)
Bilirubin Total: 0.3 mg/dL (ref 0.0–1.2)
Calcium: 9.7 mg/dL (ref 8.7–10.3)
Chloride: 105 mmol/L (ref 96–106)
Creatinine, Ser: 0.81 mg/dL (ref 0.57–1.00)
GFR calc Af Amer: 86 mL/min/{1.73_m2} (ref >59)
GFR calc non Af Amer: 74 mL/min/{1.73_m2} (ref >59)
Globulin, Total: 2.1 g/dL (ref 1.5–4.5)
Glucose: 112 mg/dL — ABNORMAL HIGH (ref 65–99)
Potassium: 4.1 mmol/L (ref 3.5–5.2)
Sodium: 141 mmol/L (ref 134–144)
Total Protein: 6.6 g/dL (ref 6.0–8.5)

## 2019-12-03 LAB — CREATININE WITH EST GFR
Creatinine, Ser: 0.82 mg/dL (ref 0.57–1.00)
GFR calc Af Amer: 84 mL/min/{1.73_m2} (ref >59)
GFR calc non Af Amer: 73 mL/min/{1.73_m2} (ref >59)

## 2019-12-03 LAB — HEPATIC FUNCTION PANEL
ALT: 15 IU/L (ref 0–32)
AST: 22 IU/L (ref 0–40)
Albumin: 4.4 g/dL (ref 3.8–4.8)
Alkaline Phosphatase: 74 IU/L (ref 39–117)
Bilirubin Total: 0.3 mg/dL (ref 0.0–1.2)
Bilirubin, Direct: 0.1 mg/dL (ref 0.00–0.40)
Total Protein: 6.6 g/dL (ref 6.0–8.5)

## 2019-12-10 NOTE — Progress Notes (Signed)
Established Patient Office Visit  Subjective:  Patient ID: Olivia Cruz, female    DOB: 12-18-1950  Age: 69 y.o. MRN: [id number]  CC:  Chief Complaint  Patient presents with  . Hypothyroidism  . RLS  . Anxiety    HPI Olivia Cruz presents for Prediabete and hypertension. Has been losing weight with weight watchers. Just started walking outside. Currently, on Losartan 25 mg once daily for htn. She does not feel she needs this medicine. For her hyperlipidemia she is on fish oil 1 gm 2 capsules daily.   Hypothyroidism - taking synthroid 50 mcg once daily.   RLS - pramipexole has helped. Burning in feet has resolved.   Psoriatic arthritis - increasing MTX 2.5 mg 6 pills daily and folate. Has helped a lot. Managed by Rheumatology.   Past Medical History:  Diagnosis Date  . Allergic rhinoconjunctivitis   . Anxiety   . Asthma   . Depression   . Diverticulitis   . GERD (gastroesophageal reflux disease)   . Hypothyroid   . Insomnia   . Laryngopharyngeal reflux (LPR)   . Migraines   . Osteoarthritis   . Psoriatic arthritis ([place])   . Skin cancer   . STD (sexually transmitted disease)    HSV II    Past Surgical History:  Procedure Laterality Date  . BELPHAROPTOSIS REPAIR    . BREAST SURGERY Left    times 2  . COLONOSCOPY  06/03/2013   Moderate predominantly sigmoid diverticulosis. Small interal hemorroids  . FOOT SURGERY Right    Removed bone spur  . GANGLION CYST EXCISION Left    foot  . HEMORRHOID SURGERY    . SKIN CANCER EXCISION    . UPPER GI ENDOSCOPY  03/23/2016   Mild gastritis, gastric polyps bxbenign squamous mucosa with no abnormaility, fundic glad poly in setting of mild chron gastritis.    Family History  Problem Relation Age of Onset  . Breast cancer Sister        lung  . Cancer Maternal Grandmother   . Multiple births Maternal Grandmother   . Multiple births Paternal Grandmother   . Cancer Paternal Grandmother   . Thyroid disease  Mother   . Heart failure Mother   . Parkinson's disease Father     Social History   Socioeconomic History  . Marital status: Married    Spouse name: Not on file  . Number of children: Not on file  . Years of education: Not on file  . Highest education level: Not on file  Occupational History  . Not on file  Tobacco Use  . Smoking status: Former Smoker    Quit date: 2000    Years since quitting: 21.3  . Smokeless tobacco: Never Used  Substance and Sexual Activity  . Alcohol use: No    Alcohol/week: 0.0 standard drinks  . Drug use: No  . Sexual activity: Yes    Partners: Male    Comment: husband vasectomy  Other Topics Concern  . Not on file  Social History Narrative  . Not on file   Social Determinants of Health   Financial Resource Strain:   . Difficulty of Paying Living Expenses:   Food Insecurity:   . Worried About Charity fundraiser in the Last Year:   . Arboriculturist in the Last Year:   Transportation Needs:   . Film/video editor (Medical):   Marland Kitchen Lack of Transportation (Non-Medical):   Physical Activity:   .  Days of Exercise per Week:   . Minutes of Exercise per Session:   Stress:   . Feeling of Stress :   Social Connections:   . Frequency of Communication with Friends and Family:   . Frequency of Social Gatherings with Friends and Family:   . Attends Religious Services:   . Active Member of Clubs or Organizations:   . Attends Archivist Meetings:   Marland Kitchen Marital Status:   Intimate Partner Violence:   . Fear of Current or Ex-Partner:   . Emotionally Abused:   Marland Kitchen Physically Abused:   . Sexually Abused:     Outpatient Medications Prior to Visit  Medication Sig Dispense Refill  . albuterol (PROAIR HFA) 108 (90 Base) MCG/ACT inhaler Inhale two puffs every four to six hours as needed for cough or wheeze. 1 Inhaler 1  . budesonide (RHINOCORT ALLERGY) 32 MCG/ACT nasal spray Place 1 spray into both nostrils daily. Reported on 02/04/2016    .  Calcium Carbonate-Vitamin D (CALCIUM + D PO) Take 1,200 mg by mouth daily.    . citalopram (CELEXA) 20 MG tablet TAKE 1 TABLET BY MOUTH EVERY DAY AS DIRECTED 90 tablet 0  . folic acid (FOLVITE) 1 MG tablet Take by mouth.    Marland Kitchen leucovorin (WELLCOVORIN) 10 MG tablet PLEASE SEE ATTACHED FOR DETAILED DIRECTIONS    . levothyroxine (SYNTHROID, LEVOTHROID) 50 MCG tablet TK 1 T PO D  1  . losartan (COZAAR) 25 MG tablet Take 1 tablet (25 mg total) by mouth daily. 90 tablet 0  . Multiple Vitamin (MULTIVITAMIN) capsule Take 1 capsule by mouth daily.    . nitrofurantoin, macrocrystal-monohydrate, (MACROBID) 100 MG capsule 1 po orally, post coitally 30 capsule 2  . Omega-3 Fatty Acids (SUPER OMEGA 3 EPA/DHA) 1000 MG CAPS Take 1,000 mg by mouth. 2 capsules daily    . pantoprazole (PROTONIX) 40 MG tablet TAKE 1 TABLET BY MOUTH EVERY DAY 90 tablet 1  . pramipexole (MIRAPEX) 1 MG tablet TAKE 1 TABLET (1 MG) BY ORAL ROUTE DAILY 90 tablet 1  . valACYclovir (VALTREX) 500 MG tablet TAKE 1 TABLET BY MOUTH EVERY DAY    . acetaminophen (TYLENOL) 325 MG tablet Take by mouth.    . methotrexate (RHEUMATREX) 2.5 MG tablet TAKE 6 TABLETS (15 MG TOTAL) BY MOUTH ONCE A WEEK FOR 30 DAYS.    Marland Kitchen aspirin 81 MG EC tablet Take by mouth.    . fluocinonide cream (LIDEX) 0.05 % Apply topically.    . Multiple Vitamins-Minerals (EMERGEN-C VITAMIN C PO) Take by mouth.     No facility-administered medications prior to visit.    Allergies  Allergen Reactions  . Codeine Anaphylaxis and Nausea Only  . Ambien [Zolpidem Tartrate] Other (See Comments)    Memory loss per patient  . Augmentin [Amoxicillin-Pot Clavulanate] Nausea And Vomiting  . Cyproheptadine Hcl Other (See Comments)    Confusion  . Dilaudid [Hydromorphone]   . Hydrocodone Other (See Comments)  . Lisinopril   . Oxycodone   . Prednisone   . Sulfa Antibiotics Other (See Comments)    hives  . Tramadol     ROS Review of Systems  Constitutional: Positive for  diaphoresis (night sweats). Negative for chills, fatigue and fever.  HENT: Positive for voice change (Hoarseness starting again. Plans to start allegra again and started mucinex dm.). Negative for congestion, ear pain, rhinorrhea, sneezing and sore throat.   Respiratory: Positive for cough (allergies. voice is hoarse). Negative for shortness of breath.  Cardiovascular: Negative for chest pain.  Gastrointestinal: Negative for abdominal pain, constipation, diarrhea, nausea and vomiting.  Genitourinary: Negative for dysuria and urgency.  Musculoskeletal: Positive for arthralgias (Taking Methotrexate). Negative for back pain and myalgias.       Right knee pain. Saw orthopedics. Going to start hyaluronic acid injections on Monday.   Neurological: Negative for dizziness, weakness, light-headedness and headaches.  Psychiatric/Behavioral: Negative for dysphoric mood. The patient is not nervous/anxious.       Objective:    Physical Exam  Constitutional: She is oriented to person, place, and time. She appears well-developed and well-nourished.  Cardiovascular: Normal rate, regular rhythm and normal heart sounds.  Pulmonary/Chest: Effort normal and breath sounds normal.  Abdominal: Soft. Bowel sounds are normal. There is no abdominal tenderness.  Neurological: She is alert and oriented to person, place, and time.  Psychiatric: She has a normal mood and affect. Her behavior is normal.    BP 102/64   Pulse 70   Temp (!) 96 F (35.6 C)   Ht 5' 1.5" (1.562 m)   Wt 143 lb (64.9 kg)   LMP 08/22/2005   SpO2 99%   BMI 26.58 kg/m  Wt Readings from Last 3 Encounters:  12/11/19 143 lb (64.9 kg)  10/28/19 146 lb (66.2 kg)  10/28/19 150 lb (68 kg)     Health Maintenance Due  Topic Date Due  . TETANUS/TDAP  Never done  . PNA vac Low Risk Adult (1 of 2 - PCV13) Never done  . MAMMOGRAM  05/23/2018    There are no preventive care reminders to display for this patient.  Lab Results  Component  Value Date   TSH 3.489 02/18/2013   Lab Results  Component Value Date   WBC 4.5 12/02/2019   HGB 14.3 12/02/2019   HCT 42.7 12/02/2019   MCV 87 12/02/2019   PLT 286 12/02/2019   Lab Results  Component Value Date   NA 141 12/02/2019   K 4.1 12/02/2019   CO2 27 02/18/2013   GLUCOSE 112 (H) 12/02/2019   BUN 15 12/02/2019   CREATININE 0.82 12/02/2019   BILITOT 0.3 12/02/2019   ALKPHOS 74 12/02/2019   AST 22 12/02/2019   ALT 15 12/02/2019   PROT 6.6 12/02/2019   ALBUMIN 4.4 12/02/2019   CALCIUM 9.7 12/02/2019   Lab Results  Component Value Date   CHOL 123 02/18/2013   Lab Results  Component Value Date   HDL 59 02/18/2013   Lab Results  Component Value Date   LDLCALC 48 02/18/2013   Lab Results  Component Value Date   TRIG 82 02/18/2013   Lab Results  Component Value Date   CHOLHDL 2.1 02/18/2013   Lab Results  Component Value Date   HGBA1C 5.7 (H) 12/11/2019      Assessment & Plan:  1. Secondary hypothyroidism The current medical regimen is effective;  continue present plan and medications.  2. Essential hypertension Stop losartan. Check bp daily. Continue weight watchers and exercise.  3. Prediabetes Continue weight watchers and exercise.  - Hemoglobin A1c  4. Mixed hyperlipidemia Well controlled.  No changes to medicines.  Continue to work on eating a healthy diet and exercise.  Labs reviewed with patient.    5. Psoriatic arthritis ([place]) The current medical regimen is effective;  continue present plan and medications.  6. RLS (restless legs syndrome) The current medical regimen is effective;  continue present plan and medications.  7. Idiopathic progressive neuropathy The current medical regimen is  effective;  continue present plan and medications.  8. Hoarseness. Start allegra and speech therapy exercises.  Follow-up: Return in about 6 months (around 06/11/2020) for fasting. Needs AWV with Kim LPN within 1-2 months. Rochel Brome,  MD

## 2019-12-11 ENCOUNTER — Ambulatory Visit (INDEPENDENT_AMBULATORY_CARE_PROVIDER_SITE_OTHER): Payer: Medicare HMO | Admitting: Family Medicine

## 2019-12-11 ENCOUNTER — Other Ambulatory Visit: Payer: Self-pay

## 2019-12-11 ENCOUNTER — Encounter: Payer: Self-pay | Admitting: Family Medicine

## 2019-12-11 VITALS — BP 102/64 | HR 70 | Temp 96.0°F | Ht 61.5 in | Wt 143.0 lb

## 2019-12-11 DIAGNOSIS — G603 Idiopathic progressive neuropathy: Secondary | ICD-10-CM

## 2019-12-11 DIAGNOSIS — E782 Mixed hyperlipidemia: Secondary | ICD-10-CM | POA: Diagnosis not present

## 2019-12-11 DIAGNOSIS — L4050 Arthropathic psoriasis, unspecified: Secondary | ICD-10-CM

## 2019-12-11 DIAGNOSIS — R7303 Prediabetes: Secondary | ICD-10-CM | POA: Diagnosis not present

## 2019-12-11 DIAGNOSIS — E038 Other specified hypothyroidism: Secondary | ICD-10-CM

## 2019-12-11 DIAGNOSIS — I10 Essential (primary) hypertension: Secondary | ICD-10-CM

## 2019-12-11 DIAGNOSIS — E039 Hypothyroidism, unspecified: Secondary | ICD-10-CM

## 2019-12-11 DIAGNOSIS — G2581 Restless legs syndrome: Secondary | ICD-10-CM

## 2019-12-11 LAB — HEMOGLOBIN A1C
Est. average glucose Bld gHb Est-mCnc: 117 mg/dL
Hgb A1c MFr Bld: 5.7 % — ABNORMAL HIGH (ref 4.8–5.6)

## 2019-12-11 NOTE — Patient Instructions (Addendum)
Hold losartan. Check bp daily. Continue weight watchers. Good Job!! Hoarseness - start allegra and exercises.

## 2019-12-12 ENCOUNTER — Telehealth: Payer: Self-pay | Admitting: Family Medicine

## 2019-12-12 NOTE — Progress Notes (Signed)
  Chronic Care Management   Note  12/12/2019 Name: Olivia Cruz MRN: [id number] DOB: 02-16-1951  Olivia Cruz is a 69 y.o. year old female who is a primary care patient of Cox, Kirsten, MD. I reached out to [place] by phone today in response to a referral sent by [name]'s PCP, Cox, Kirsten, MD.   [name] was given information about Chronic Care Management services today including:  1. CCM service includes personalized support from designated clinical staff supervised by her physician, including individualized plan of care and coordination with other care providers 2. 24/7 contact phone numbers for assistance for urgent and routine care needs. 3. Service will only be billed when office clinical staff spend 20 minutes or more in a month to coordinate care. 4. Only one practitioner may furnish and bill the service in a calendar month. 5. The patient may stop CCM services at any time (effective at the end of the month) by phone call to the office staff.   Patient agreed to services and verbal consent obtained.   Follow up plan:   Earney [place] Upstream Scheduler

## 2019-12-16 DIAGNOSIS — M170 Bilateral primary osteoarthritis of knee: Secondary | ICD-10-CM | POA: Diagnosis not present

## 2019-12-19 DIAGNOSIS — M65311 Trigger thumb, right thumb: Secondary | ICD-10-CM | POA: Diagnosis not present

## 2019-12-19 DIAGNOSIS — Z79899 Other long term (current) drug therapy: Secondary | ICD-10-CM | POA: Diagnosis not present

## 2019-12-19 DIAGNOSIS — Z791 Long term (current) use of non-steroidal anti-inflammatories (NSAID): Secondary | ICD-10-CM | POA: Diagnosis not present

## 2019-12-19 DIAGNOSIS — L409 Psoriasis, unspecified: Secondary | ICD-10-CM | POA: Diagnosis not present

## 2019-12-19 DIAGNOSIS — Z7982 Long term (current) use of aspirin: Secondary | ICD-10-CM | POA: Diagnosis not present

## 2019-12-19 DIAGNOSIS — M79643 Pain in unspecified hand: Secondary | ICD-10-CM | POA: Diagnosis not present

## 2019-12-22 DIAGNOSIS — G2581 Restless legs syndrome: Secondary | ICD-10-CM | POA: Insufficient documentation

## 2019-12-22 DIAGNOSIS — R7303 Prediabetes: Secondary | ICD-10-CM

## 2019-12-22 DIAGNOSIS — E782 Mixed hyperlipidemia: Secondary | ICD-10-CM | POA: Insufficient documentation

## 2019-12-22 DIAGNOSIS — E038 Other specified hypothyroidism: Secondary | ICD-10-CM

## 2019-12-22 DIAGNOSIS — I10 Essential (primary) hypertension: Secondary | ICD-10-CM | POA: Insufficient documentation

## 2019-12-22 HISTORY — DX: Prediabetes: R73.03

## 2019-12-22 HISTORY — DX: Mixed hyperlipidemia: E78.2

## 2019-12-22 HISTORY — DX: Other specified hypothyroidism: E03.8

## 2019-12-26 DIAGNOSIS — M65311 Trigger thumb, right thumb: Secondary | ICD-10-CM | POA: Diagnosis not present

## 2019-12-27 ENCOUNTER — Other Ambulatory Visit: Payer: Self-pay | Admitting: Family Medicine

## 2019-12-27 DIAGNOSIS — G2581 Restless legs syndrome: Secondary | ICD-10-CM | POA: Diagnosis not present

## 2019-12-27 DIAGNOSIS — L4050 Arthropathic psoriasis, unspecified: Secondary | ICD-10-CM | POA: Diagnosis not present

## 2019-12-27 DIAGNOSIS — K219 Gastro-esophageal reflux disease without esophagitis: Secondary | ICD-10-CM | POA: Diagnosis not present

## 2019-12-27 DIAGNOSIS — E039 Hypothyroidism, unspecified: Secondary | ICD-10-CM | POA: Diagnosis not present

## 2019-12-27 DIAGNOSIS — E569 Vitamin deficiency, unspecified: Secondary | ICD-10-CM | POA: Diagnosis not present

## 2019-12-27 DIAGNOSIS — R69 Illness, unspecified: Secondary | ICD-10-CM | POA: Diagnosis not present

## 2019-12-27 DIAGNOSIS — Z008 Encounter for other general examination: Secondary | ICD-10-CM | POA: Diagnosis not present

## 2019-12-27 DIAGNOSIS — Z79899 Other long term (current) drug therapy: Secondary | ICD-10-CM | POA: Diagnosis not present

## 2019-12-27 DIAGNOSIS — I739 Peripheral vascular disease, unspecified: Secondary | ICD-10-CM | POA: Diagnosis not present

## 2019-12-31 NOTE — Progress Notes (Signed)
Subjective:  Patient ID: Olivia Cruz, female    DOB: [birth year]  Age: 69 y.o. MRN: [id number]  Chief Complaint  Patient presents with  . follow up diagnostic test    Patients states that when a Nurse from [place] came to home and performed a evaluation she informed the patient she has a severe obstruction in her left leg.    HPI  Patient presents after a nurse from [place] came out and checked her pulses with a special machine and she had decreased pulses in her left foot.   She has increased anxiety related to her husband. Her husband is driving her crazy. She is concerned that he is becoming more and more stubborn. He refuses to wear his hearing aid at times. He has some memory issues also.   Psoriatic arthritis. Currently on mtx and folate. This has helped a lot, but is due for cbc, cmp today.   Past Medical History:  Diagnosis Date  . Allergic rhinoconjunctivitis   . Anxiety   . Asthma   . Depression   . Diverticulitis   . GERD (gastroesophageal reflux disease)   . Hypothyroid   . Insomnia   . Laryngopharyngeal reflux (LPR)   . Migraines   . Osteoarthritis   . Psoriatic arthritis ([place])   . Skin cancer   . STD (sexually transmitted disease)    HSV II   Past Surgical History:  Procedure Laterality Date  . BELPHAROPTOSIS REPAIR    . BREAST SURGERY Left    times 2  . COLONOSCOPY  06/03/2013   Moderate predominantly sigmoid diverticulosis. Small interal hemorroids  . FOOT SURGERY Right    Removed bone spur  . GANGLION CYST EXCISION Left    foot  . HEMORRHOID SURGERY    . SKIN CANCER EXCISION    . UPPER GI ENDOSCOPY  03/23/2016   Mild gastritis, gastric polyps bxbenign squamous mucosa with no abnormaility, fundic glad poly in setting of mild chron gastritis.    Family History  Problem Relation Age of Onset  . Breast cancer Sister        lung  . Cancer Maternal Grandmother   . Multiple births Maternal Grandmother   . Multiple births Paternal Grandmother   .  Cancer Paternal Grandmother   . Thyroid disease Mother   . Heart failure Mother   . Parkinson's disease Father    Social History   Socioeconomic History  . Marital status: Married    Spouse name: Not on file  . Number of children: Not on file  . Years of education: Not on file  . Highest education level: Not on file  Occupational History  . Not on file  Tobacco Use  . Smoking status: Former Smoker    Quit date: 2000    Years since quitting: 21.3  . Smokeless tobacco: Never Used  Substance and Sexual Activity  . Alcohol use: No    Alcohol/week: 0.0 standard drinks  . Drug use: No  . Sexual activity: Yes    Partners: Male    Comment: husband vasectomy  Other Topics Concern  . Not on file  Social History Narrative  . Not on file   Social Determinants of Health   Financial Resource Strain:   . Difficulty of Paying Living Expenses:   Food Insecurity:   . Worried About Charity fundraiser in the Last Year:   . Arboriculturist in the Last Year:   Transportation Needs:   . Lack  of Transportation (Medical):   Marland Kitchen Lack of Transportation (Non-Medical):   Physical Activity:   . Days of Exercise per Week:   . Minutes of Exercise per Session:   Stress:   . Feeling of Stress :   Social Connections:   . Frequency of Communication with Friends and Family:   . Frequency of Social Gatherings with Friends and Family:   . Attends Religious Services:   . Active Member of Clubs or Organizations:   . Attends Archivist Meetings:   Marland Kitchen Marital Status:     Review of Systems  Constitutional: Positive for diaphoresis (night sweats). Negative for chills, fatigue and fever.  HENT: Negative for congestion, ear pain, rhinorrhea and sore throat.   Respiratory: Negative for cough and shortness of breath.   Cardiovascular: Negative for chest pain.  Gastrointestinal: Negative for abdominal pain, constipation, diarrhea, nausea and vomiting.  Genitourinary: Negative for dysuria and  urgency.  Musculoskeletal: Negative for back pain and myalgias.  Allergic/Immunologic: Positive for environmental allergies.  Neurological: Negative for dizziness, weakness, light-headedness and headaches.  Psychiatric/Behavioral: Negative for dysphoric mood. The patient is nervous/anxious.      Objective:  BP 116/68   Pulse 70   Temp (!) 95.5 F (35.3 C)   Ht 5' 1.5" (1.562 m)   Wt 143 lb (64.9 kg)   LMP 08/22/2005   SpO2 99%   BMI 26.58 kg/m   BP/Weight 01/02/2020 0000000 [id number]  Systolic BP [id number]  Diastolic BP 68 64 72  Wt. (Lbs) 143 143 146  BMI 26.58 26.58 28.04    Physical Exam Vitals reviewed.  Constitutional:      Appearance: Normal appearance. She is normal weight.  Cardiovascular:     Rate and Rhythm: Normal rate and regular rhythm.     Pulses: Normal pulses.     Heart sounds: Normal heart sounds.  Pulmonary:     Effort: Pulmonary effort is normal.     Breath sounds: Normal breath sounds.  Abdominal:     General: Abdomen is flat. Bowel sounds are normal.     Palpations: Abdomen is soft.     Tenderness: There is no abdominal tenderness.  Neurological:     Mental Status: She is alert and oriented to person, place, and time.  Psychiatric:        Mood and Affect: Mood normal.        Behavior: Behavior normal.     Diabetic Foot Exam - Simple   No data filed       Lab Results  Component Value Date   WBC 4.5 12/02/2019   HGB 14.3 12/02/2019   HCT 42.7 12/02/2019   PLT 286 12/02/2019   GLUCOSE 112 (H) 12/02/2019   CHOL 123 02/18/2013   TRIG 82 02/18/2013   HDL 59 02/18/2013   LDLCALC 48 02/18/2013   ALT 15 12/02/2019   AST 22 12/02/2019   NA 141 12/02/2019   K 4.1 12/02/2019   CL 105 12/02/2019   CREATININE 0.82 12/02/2019   BUN 15 12/02/2019   CO2 27 02/18/2013   TSH 3.489 02/18/2013   HGBA1C 5.7 (H) 12/11/2019      Assessment & Plan:   1. Decreased pedal pulses - Ankle Brachial Index Assessment    Orders Placed This  Encounter  Procedures  . Ankle Brachial Index Assessment     Follow-up: No follow-ups on file.  An After Visit Summary was printed and given to the patient.  Rochel Brome [name] [phone] 

## 2020-01-02 ENCOUNTER — Other Ambulatory Visit: Payer: Self-pay

## 2020-01-02 ENCOUNTER — Encounter: Payer: Self-pay | Admitting: Family Medicine

## 2020-01-02 ENCOUNTER — Ambulatory Visit (INDEPENDENT_AMBULATORY_CARE_PROVIDER_SITE_OTHER): Payer: Medicare HMO | Admitting: Family Medicine

## 2020-01-02 VITALS — BP 116/68 | HR 70 | Temp 95.5°F | Ht 61.5 in | Wt 143.0 lb

## 2020-01-02 DIAGNOSIS — R0989 Other specified symptoms and signs involving the circulatory and respiratory systems: Secondary | ICD-10-CM | POA: Diagnosis not present

## 2020-01-02 DIAGNOSIS — L4050 Arthropathic psoriasis, unspecified: Secondary | ICD-10-CM

## 2020-01-02 LAB — CBC WITH DIFFERENTIAL/PLATELET
Basophils Absolute: 0.1 10*3/uL (ref 0.0–0.2)
Basos: 1 %
EOS (ABSOLUTE): 0.1 10*3/uL (ref 0.0–0.4)
Eos: 2 %
Hematocrit: 43.4 % (ref 34.0–46.6)
Hemoglobin: 14.5 g/dL (ref 11.1–15.9)
Immature Grans (Abs): 0 10*3/uL (ref 0.0–0.1)
Immature Granulocytes: 0 %
Lymphocytes Absolute: 1.5 10*3/uL (ref 0.7–3.1)
Lymphs: 26 %
MCH: 29.6 pg (ref 26.6–33.0)
MCHC: 33.4 g/dL (ref 31.5–35.7)
MCV: 89 fL (ref 79–97)
Monocytes Absolute: 0.5 10*3/uL (ref 0.1–0.9)
Monocytes: 9 %
Neutrophils Absolute: 3.5 10*3/uL (ref 1.4–7.0)
Neutrophils: 62 %
Platelets: 302 10*3/uL (ref 150–450)
RBC: 4.9 x10E6/uL (ref 3.77–5.28)
RDW: 14.5 % (ref 11.7–15.4)
WBC: 5.6 10*3/uL (ref 3.4–10.8)

## 2020-01-02 LAB — COMPREHENSIVE METABOLIC PANEL
ALT: 16 IU/L (ref 0–32)
AST: 20 IU/L (ref 0–40)
Albumin/Globulin Ratio: 2 (ref 1.2–2.2)
Albumin: 4.3 g/dL (ref 3.8–4.8)
Alkaline Phosphatase: 85 IU/L (ref 39–117)
BUN/Creatinine Ratio: 15 (ref 12–28)
BUN: 11 mg/dL (ref 8–27)
Bilirubin Total: 0.3 mg/dL (ref 0.0–1.2)
CO2: 24 mmol/L (ref 20–29)
Calcium: 10.1 mg/dL (ref 8.7–10.3)
Chloride: 102 mmol/L (ref 96–106)
Creatinine, Ser: 0.72 mg/dL (ref 0.57–1.00)
GFR calc Af Amer: 99 mL/min/{1.73_m2} (ref >59)
GFR calc non Af Amer: 86 mL/min/{1.73_m2} (ref >59)
Globulin, Total: 2.2 g/dL (ref 1.5–4.5)
Glucose: 76 mg/dL (ref 65–99)
Potassium: 4 mmol/L (ref 3.5–5.2)
Sodium: 141 mmol/L (ref 134–144)
Total Protein: 6.5 g/dL (ref 6.0–8.5)

## 2020-01-02 NOTE — Patient Instructions (Signed)
Dr. Yancey Flemings [phone].  Guerry Bruin

## 2020-01-06 ENCOUNTER — Other Ambulatory Visit: Payer: Medicare HMO

## 2020-01-06 ENCOUNTER — Other Ambulatory Visit: Payer: Self-pay

## 2020-01-06 DIAGNOSIS — R0989 Other specified symptoms and signs involving the circulatory and respiratory systems: Secondary | ICD-10-CM

## 2020-01-09 ENCOUNTER — Telehealth: Payer: Medicare HMO

## 2020-01-10 ENCOUNTER — Telehealth: Payer: Self-pay | Admitting: Family Medicine

## 2020-01-10 NOTE — Progress Notes (Signed)
  Chronic Care Management   Outreach Note  01/10/2020 Name: Olivia Cruz MRN: [id number] DOB: 09/13/1950  Referred by: Rochel Brome, MD Reason for referral : No chief complaint on file.   An unsuccessful telephone outreach was attempted today. The patient was referred to the pharmacist for assistance with care management and care coordination.   This note is not being shared with the patient for the following reason: To respect privacy (The patient or proxy has requested that the information not be shared).  Follow Up Plan:   Earney Hamburg Upstream Scheduler

## 2020-01-13 DIAGNOSIS — Z0100 Encounter for examination of eyes and vision without abnormal findings: Secondary | ICD-10-CM | POA: Diagnosis not present

## 2020-01-15 ENCOUNTER — Telehealth: Payer: Self-pay | Admitting: Family Medicine

## 2020-01-15 NOTE — Progress Notes (Signed)
  Chronic Care Management   Note  01/15/2020 Name: Olivia Cruz MRN: [id number] DOB: 1950-10-30  Olivia Cruz is a 69 y.o. year old female who is a primary care patient of Cox, Kirsten, MD. I reached out to [place] by phone today in response to a referral sent by Ms. Rich Number Rhatigan's PCP, Cox, Kirsten, MD.   Ms. Bangert was given information about Chronic Care Management services today including:  1. CCM service includes personalized support from designated clinical staff supervised by her physician, including individualized plan of care and coordination with other care providers 2. 24/7 contact phone numbers for assistance for urgent and routine care needs. 3. Service will only be billed when office clinical staff spend 20 minutes or more in a month to coordinate care. 4. Only one practitioner may furnish and bill the service in a calendar month. 5. The patient may stop CCM services at any time (effective at the end of the month) by phone call to the office staff.   Patient agreed to services and verbal consent obtained.   This note is not being shared with the patient for the following reason: To respect privacy (The patient or proxy has requested that the information not be shared).  Follow up plan:   Earney Hamburg Upstream Scheduler

## 2020-01-17 ENCOUNTER — Other Ambulatory Visit: Payer: Self-pay

## 2020-01-17 DIAGNOSIS — E038 Other specified hypothyroidism: Secondary | ICD-10-CM

## 2020-01-17 DIAGNOSIS — I10 Essential (primary) hypertension: Secondary | ICD-10-CM

## 2020-01-17 DIAGNOSIS — E782 Mixed hyperlipidemia: Secondary | ICD-10-CM

## 2020-01-17 NOTE — Progress Notes (Signed)
Appt was on 01/09/2020 with Sara Beth Brown, PharmD. 

## 2020-01-21 ENCOUNTER — Encounter: Payer: Self-pay | Admitting: Family Medicine

## 2020-01-21 DIAGNOSIS — R0989 Other specified symptoms and signs involving the circulatory and respiratory systems: Secondary | ICD-10-CM | POA: Diagnosis not present

## 2020-01-23 ENCOUNTER — Other Ambulatory Visit: Payer: Self-pay | Admitting: Family Medicine

## 2020-01-30 DIAGNOSIS — H43812 Vitreous degeneration, left eye: Secondary | ICD-10-CM | POA: Diagnosis not present

## 2020-02-05 NOTE — Chronic Care Management (AMB) (Deleted)
Chronic Care Management Pharmacy  Name: Olivia Cruz  MRN: [id number] DOB: Oct 16, 1950  Chief Complaint/ HPI  Olivia Cruz,  69 y.o. , female presents for their {Initial/Follow-up:3041532} CCM visit with the clinical pharmacist {CHL HP Upstream Pharm visit WUJW:[id number]}.  PCP : Rochel Brome, MD  Their chronic conditions include: hypothyroidism, prediabetes, rls, hld, htn, osteoarthritis, GERD, depression, anxiety, asthma, insomnia, psoriatic arthritis.   Office Visits:*** 01/02/2020 - patient presents after [facility] nurse came out and said she had an obstruction in leg. Dr. Tobie Poet ordered an Ankle Brachial Index Assessment.  12/11/2019 - Hold losartan and check bp daily.  10/28/2019 - Dr. Lyndel Safe okayed pantoprazole and MTX interaction. Blood sugar and BP controlled.  09/26/2019 - acute cystitis - empirically started on cipro. Culture revealed not sensitive. Changed to macrobid.  09/09/2019 - *** Consult Visit:*** 12/26/2019 - Ortho visit for right trigger thumb. Kenalog injection in thumb.  12/19/2019 - Rhumatology - continue MTX 15 mg weekly. Blood work 05/17 after 3 doses of higher strength.  12/16/2019 - Sports Medicine/Ortho - bilateral knee monovisc injection for osteoarthritis.  11/12/2019 - ortho visit - osteoarthritis of knee pain. patient would like to initiate gel shots for relief.  11/08/2019 - Rheumatology - monitoring cough/lungs and low energy. Kenalog shot administered. 10/28/2019 - Well woman exam - BMD next year.  01/11-14/2021 - Covid 19 infusion.  Medications: Outpatient Encounter Medications as of 02/12/2020  Medication Sig  . acetaminophen (TYLENOL) 325 MG tablet Take by mouth.  Marland Kitchen albuterol (PROAIR HFA) 108 (90 Base) MCG/ACT inhaler Inhale two puffs every four to six hours as needed for cough or wheeze.  . budesonide (RHINOCORT ALLERGY) 32 MCG/ACT nasal spray Place 1 spray into both nostrils daily. Reported on 02/04/2016  . Calcium Carbonate-Vitamin D  (CALCIUM + D PO) Take 1,200 mg by mouth daily.  . citalopram (CELEXA) 20 MG tablet TAKE 1 TABLET BY MOUTH EVERY DAY AS DIRECTED  . folic acid (FOLVITE) 1 MG tablet Take by mouth.  Marland Kitchen leucovorin (WELLCOVORIN) 10 MG tablet PLEASE SEE ATTACHED FOR DETAILED DIRECTIONS  . levothyroxine (SYNTHROID) 50 MCG tablet TAKE 1 TABLET BY MOUTH EVERY DAY  . losartan (COZAAR) 25 MG tablet Take 1 tablet (25 mg total) by mouth daily.  . methotrexate (RHEUMATREX) 2.5 MG tablet TAKE 6 TABLETS (15 MG TOTAL) BY MOUTH ONCE A WEEK FOR 30 DAYS.  Marland Kitchen Multiple Vitamin (MULTIVITAMIN) capsule Take 1 capsule by mouth daily.  . nitrofurantoin, macrocrystal-monohydrate, (MACROBID) 100 MG capsule 1 po orally, post coitally  . Omega-3 Fatty Acids (SUPER OMEGA 3 EPA/DHA) 1000 MG CAPS Take 1,000 mg by mouth. 2 capsules daily  . pantoprazole (PROTONIX) 40 MG tablet TAKE 1 TABLET BY MOUTH EVERY DAY  . pramipexole (MIRAPEX) 1 MG tablet TAKE 1 TABLET (1 MG) BY ORAL ROUTE DAILY  . valACYclovir (VALTREX) 500 MG tablet TAKE 1 TABLET BY MOUTH EVERY DAY   No facility-administered encounter medications on file as of 02/12/2020.   Allergies  Allergen Reactions  . Codeine Anaphylaxis and Nausea Only  . Ambien [Zolpidem Tartrate] Other (See Comments)    Memory loss per patient  . Augmentin [Amoxicillin-Pot Clavulanate] Nausea And Vomiting  . Cyproheptadine Hcl Other (See Comments)    Confusion  . Dilaudid [Hydromorphone]   . Hydrocodone Other (See Comments)  . Lisinopril   . Oxycodone   . Prednisone   . Sulfa Antibiotics Other (See Comments)    hives  . Tramadol    SDOH Screenings   Alcohol Screen:   .  Last Alcohol Screening Score (AUDIT):   Depression (PHQ2-9): Low Risk   . PHQ-2 Score: 4  Financial Resource Strain:   . Difficulty of Paying Living Expenses:   Food Insecurity:   . Worried About Charity fundraiser in the Last Year:   . Long in the Last Year:   Housing:   . Last Housing Risk Score:   Physical  Activity:   . Days of Exercise per Week:   . Minutes of Exercise per Session:   Social Connections:   . Frequency of Communication with Friends and Family:   . Frequency of Social Gatherings with Friends and Family:   . Attends Religious Services:   . Active Member of Clubs or Organizations:   . Attends Archivist Meetings:   Marland Kitchen Marital Status:   Stress:   . Feeling of Stress :   Tobacco Use: Medium Risk  . Smoking Tobacco Use: Former Smoker  . Smokeless Tobacco Use: Never Used  Transportation Needs:   . Film/video editor (Medical):   Marland Kitchen Lack of Transportation (Non-Medical):      Current Diagnosis/Assessment:  Goals Addressed   None     Hyperlipidemia   LDL goal < ***  Lipid Panel     Component Value Date/Time   CHOL 123 02/18/2013 1029   TRIG 82 02/18/2013 1029   HDL 59 02/18/2013 1029   LDLCALC 48 02/18/2013 1029    Hepatic Function Latest Ref Rng & Units 01/02/2020 12/02/2019 12/02/2019  Total Protein 6.0 - 8.5 g/dL 6.5 6.6 6.6  Albumin 3.8 - 4.8 g/dL 4.3 4.4 4.5  AST 0 - 40 IU/L '20 22 21  ' ALT 0 - 32 IU/L 16 15 -  Alk Phosphatase 39 - 117 IU/L 85 74 78  Total Bilirubin 0.0 - 1.2 mg/dL 0.3 0.3 0.3  Bilirubin, Direct 0.00 - 0.40 mg/dL - 0.10 -     The ASCVD Risk score Mikey Bussing DC Jr., et al., 2013) failed to calculate for the following reasons:   Cannot find a previous HDL lab   Cannot find a previous total cholesterol lab   Patient has failed these meds in past: *** Patient is currently {CHL Controlled/Uncontrolled:[phone]} on the following medications:  . Omega-3 fatty acids 1000 mg bid  We discussed:  {CHL HP Upstream Pharmacy discussion:[phone]}  Plan  Continue {CHL HP Upstream Pharmacy Plans:954 121 2028}    Hypertension   BP today is:  {CHL HP UPSTREAM Pharmacist BP ranges:[phone]}  Office blood pressures are  BP Readings from Last 3 Encounters:  01/02/20 116/68  12/11/19 102/64  10/28/19 116/72    Patient has failed  these meds in the past: lisinopril 5 mg daily Patient is currently controlled/uncontrolled*** on the following medications: losartan 25 mg daily Patient checks BP at home {CHL HP BP Monitoring Frequency:[phone]}  Patient home BP readings are ranging: ***  We discussed {CHL HP Upstream Pharmacy discussion:[phone]}  Plan  Continue {CHL HP Upstream Pharmacy Plans:954 121 2028}     and  Other Diagnosis:depression ***    Patient has failed these meds in past: diazepam, lorazepam Patient is currently {CHL Controlled/Uncontrolled:[phone]} on the following medications: citalopram 20 mg   We discussed:  {CHL HP Upstream Pharmacy discussion:[phone]}  Plan  Continue {CHL HP Upstream Pharmacy Plans:954 121 2028}  COPD / Asthma / Tobacco   Last spirometry score: ***  Gold Grade: {CHL HP Upstream Pharm COPD Gold KGYJE:[id number]} Current COPD Classification:  {CHL HP Upstream Pharm COPD [phone]}  Eosinophil count:  No results found  for: EOSPCT%                               Eos (Absolute):  Lab Results  Component Value Date/Time   EOSABS 0.1 01/02/2020 09:36 AM    Tobacco Status:  Social History   Tobacco Use  Smoking Status Former Smoker  . Quit date: 2000  . Years since quitting: 21.4  Smokeless Tobacco Never Used    Patient has failed these meds in past: azelastine nasal spray, symbicort, cetirizine, cyproheptadine, fexofenadine, mucinex, promethazine, promethazine dm Patient is currently {CHL Controlled/Uncontrolled:[phone]} on the following medications: albuterol prn, rhinocort allergy 1 spray in both nostrils daily Using maintenance inhaler regularly? {yes/no:20286} Frequency of rescue inhaler use:  {CHL HP Upstream Pharm Inhaler IAXK:[id number]}  We discussed:  {CHL HP Upstream Pharmacy [phone]}  Plan  Continue {CHL HP Upstream Pharmacy EMLJQ:[id number]}   ***   Patient has failed these meds in past: *** Patient is  currently {CHL Controlled/Uncontrolled:[phone]} on the following medications:  . leucovorin 10 mg . methotrexate tablets . Folic Acid  We discussed:  ***  Plan  Continue {CHL HP Upstream Pharmacy Plans:6060233269}  ***RLS   Patient has failed these meds in past: gabapentin Patient is currently {CHL Controlled/Uncontrolled:[phone]} on the following medications:  . Pramipexole 1 mg daily  We discussed:  ***  Plan  Continue {CHL HP Upstream Pharmacy Plans:6060233269}  Hypothyroidism   Lab Results  Component Value Date/Time   TSH 3.489 02/18/2013 10:29 AM    Patient has failed these meds in past: *** Patient is currently {CHL Controlled/Uncontrolled:[phone]} on the following medications:  . Levothyroxine 50 mcg daily  We discussed:  {CHL HP Upstream Pharmacy [phone]}  Plan  Continue {CHL HP Upstream Pharmacy Plans:6060233269}   ***GERD   Patient has failed these meds in past: dexilant, bentyl, famotidine, omeprazole, ranitidine Patient is currently {CHL Controlled/Uncontrolled:[phone]} on the following medications:  . Pantoprazole 40 mg daily  We discussed:  ***  Plan  Continue {CHL HP Upstream Pharmacy Plans:6060233269}   Osteopenia / Osteoporosis   Last DEXA Scan: ***   T-Score femoral neck: ***  T-Score total hip: ***  T-Score lumbar spine: ***  T-Score forearm radius: ***  10-year probability of major osteoporotic fracture: ***  10-year probability of hip fracture: ***  Vit D, 25-Hydroxy  Date Value Ref Range Status  02/18/2013 60 30 - 89 ng/mL Final    Comment:    This assay accurately quantifies Vitamin D, which is the sum of the 25-Hydroxy forms of Vitamin D2 and D3.  Studies have shown that the optimum concentration of 25-Hydroxy Vitamin D is 30 ng/mL or higher.  Concentrations of Vitamin D between 20 and 29 ng/mL are considered to be insufficient and concentrations less than 20 ng/mL are considered to be deficient  for Vitamin D.     Patient {is;is not an osteoporosis candidate:23886}  Patient has failed these meds in past: *** Patient is currently {CHL Controlled/Uncontrolled:[phone]} on the following medications: calcium carbonate with D daily  We discussed:  {Osteoporosis Counseling:23892}  Plan  Continue {CHL HP Upstream Pharmacy Plans:6060233269}  Health Maintenance   Patient is currently {CHL Controlled/Uncontrolled:[phone]} on the following medications:  . Tylenol 325 mg*** . Valtrex - HSVII . Macrobid post coitally - UTI prevention . Multivitamin daily - ***   We discussed:  ***  Plan  Continue {CHL HP Upstream Pharmacy RFXJO:[id number]}  Vaccines   Reviewed and discussed patient's vaccination history.  Immunization History  Administered Date(s) Administered  . DTaP 12/03/2012  . PFIZER SARS-COV-2 Vaccination 12/12/2019  . Zoster 02/19/2011  . Zoster Recombinat (Shingrix) 05/18/2018    Plan  Recommended patient receive *** vaccine in *** office/pharmacy.   Medication Management   Pt uses *** pharmacy for all medications Uses pill box? {Yes or If no, why not?:20788} Pt endorses ***% compliance  We discussed: ***  Plan  {US Pharmacy JWWZ:92780}    Follow up: *** month phone visit  ***

## 2020-02-12 ENCOUNTER — Telehealth: Payer: Medicare HMO

## 2020-02-12 ENCOUNTER — Telehealth: Payer: Self-pay | Admitting: Family Medicine

## 2020-02-12 NOTE — Progress Notes (Signed)
  Chronic Care Management   Outreach Note  02/12/2020 Name: Olivia Cruz MRN: [id number] DOB: [birth year]  Referred by: Rochel Brome, MD Reason for referral : No chief complaint on file.   An unsuccessful telephone outreach was attempted today. The patient was referred to the pharmacist for assistance with care management and care coordination. This note is not being shared with the patient for the following reason: To respect privacy (The patient or proxy has requested that the information not be shared).  Follow Up Plan:   Earney Hamburg Upstream Scheduler

## 2020-02-13 DIAGNOSIS — M25461 Effusion, right knee: Secondary | ICD-10-CM | POA: Diagnosis not present

## 2020-02-13 DIAGNOSIS — M1711 Unilateral primary osteoarthritis, right knee: Secondary | ICD-10-CM | POA: Diagnosis not present

## 2020-02-13 DIAGNOSIS — M25561 Pain in right knee: Secondary | ICD-10-CM | POA: Diagnosis not present

## 2020-03-06 ENCOUNTER — Other Ambulatory Visit: Payer: Self-pay

## 2020-03-06 DIAGNOSIS — E782 Mixed hyperlipidemia: Secondary | ICD-10-CM

## 2020-03-06 DIAGNOSIS — I10 Essential (primary) hypertension: Secondary | ICD-10-CM

## 2020-03-06 NOTE — Progress Notes (Signed)
Patient had an appt on 02/14/2020 with Sherre Poot, PharmD

## 2020-03-18 NOTE — Progress Notes (Signed)
Acute Office Visit  Subjective:    Patient ID: Olivia Cruz, female    DOB: Jul 03, 1951, 69 y.o.   MRN: [id number]  Chief Complaint  Patient presents with  . Night Sweats    HPI Patient is in today for night sweats which have been present for years. States she wakes up at night with her clothes soaked and her hair drenched. She was recently put on mtx about 4-6 months ago. The sweating began prior to this.  I did a work up last year which was normal including a 5 HIAA level to rule out carcinoid syndrome.   Past Medical History:  Diagnosis Date  . Allergic rhinoconjunctivitis   . Anxiety   . Asthma   . Depression   . Diverticulitis   . GERD (gastroesophageal reflux disease)   . Hypothyroid   . Insomnia   . Laryngopharyngeal reflux (LPR)   . Migraines   . Osteoarthritis   . Psoriatic arthritis ([place])   . Skin cancer   . STD (sexually transmitted disease)    HSV II    Past Surgical History:  Procedure Laterality Date  . BELPHAROPTOSIS REPAIR    . BREAST SURGERY Left    times 2  . COLONOSCOPY  06/03/2013   Moderate predominantly sigmoid diverticulosis. Small interal hemorroids  . FOOT SURGERY Right    Removed bone spur  . GANGLION CYST EXCISION Left    foot  . HEMORRHOID SURGERY    . SKIN CANCER EXCISION    . UPPER GI ENDOSCOPY  03/23/2016   Mild gastritis, gastric polyps bxbenign squamous mucosa with no abnormaility, fundic glad poly in setting of mild chron gastritis.    Family History  Problem Relation Age of Onset  . Breast cancer Sister        lung  . Cancer Maternal Grandmother   . Multiple births Maternal Grandmother   . Multiple births Paternal Grandmother   . Cancer Paternal Grandmother   . Thyroid disease Mother   . Heart failure Mother   . Parkinson's disease Father     Social History   Socioeconomic History  . Marital status: Married    Spouse name: Not on file  . Number of children: Not on file  . Years of education: Not on file  .  Highest education level: Not on file  Occupational History  . Not on file  Tobacco Use  . Smoking status: Former Smoker    Quit date: 2000    Years since quitting: 21.6  . Smokeless tobacco: Never Used  Vaping Use  . Vaping Use: Never used  Substance and Sexual Activity  . Alcohol use: No    Alcohol/week: 0.0 standard drinks  . Drug use: No  . Sexual activity: Yes    Partners: Male    Comment: husband vasectomy  Other Topics Concern  . Not on file  Social History Narrative  . Not on file   Social Determinants of Health   Financial Resource Strain:   . Difficulty of Paying Living Expenses:   Food Insecurity:   . Worried About Charity fundraiser in the Last Year:   . Arboriculturist in the Last Year:   Transportation Needs:   . Film/video editor (Medical):   Marland Kitchen Lack of Transportation (Non-Medical):   Physical Activity:   . Days of Exercise per Week:   . Minutes of Exercise per Session:   Stress:   . Feeling of Stress :  Social Connections:   . Frequency of Communication with Friends and Family:   . Frequency of Social Gatherings with Friends and Family:   . Attends Religious Services:   . Active Member of Clubs or Organizations:   . Attends Archivist Meetings:   Marland Kitchen Marital Status:   Intimate Partner Violence:   . Fear of Current or Ex-Partner:   . Emotionally Abused:   Marland Kitchen Physically Abused:   . Sexually Abused:     Outpatient Medications Prior to Visit  Medication Sig Dispense Refill  . acetaminophen (TYLENOL) 325 MG tablet Take by mouth.    Marland Kitchen albuterol (PROAIR HFA) 108 (90 Base) MCG/ACT inhaler Inhale two puffs every four to six hours as needed for cough or wheeze. 1 Inhaler 1  . budesonide (RHINOCORT ALLERGY) 32 MCG/ACT nasal spray Place 1 spray into both nostrils daily. Reported on 02/04/2016    . Calcium Carbonate-Vitamin D (CALCIUM + D PO) Take 1,200 mg by mouth daily.    . citalopram (CELEXA) 20 MG tablet TAKE 1 TABLET BY MOUTH EVERY DAY AS  DIRECTED 90 tablet 0  . folic acid (FOLVITE) 1 MG tablet Take by mouth.    Marland Kitchen leucovorin (WELLCOVORIN) 10 MG tablet PLEASE SEE ATTACHED FOR DETAILED DIRECTIONS    . levothyroxine (SYNTHROID) 50 MCG tablet TAKE 1 TABLET BY MOUTH EVERY DAY 90 tablet 2  . losartan (COZAAR) 25 MG tablet Take 1 tablet (25 mg total) by mouth daily. 90 tablet 0  . methotrexate (RHEUMATREX) 2.5 MG tablet TAKE 6 TABLETS (15 MG TOTAL) BY MOUTH ONCE A WEEK FOR 30 DAYS.    Marland Kitchen Multiple Vitamin (MULTIVITAMIN) capsule Take 1 capsule by mouth daily.    . nitrofurantoin, macrocrystal-monohydrate, (MACROBID) 100 MG capsule 1 po orally, post coitally 30 capsule 2  . Omega-3 Fatty Acids (SUPER OMEGA 3 EPA/DHA) 1000 MG CAPS Take 1,000 mg by mouth. 2 capsules daily    . pramipexole (MIRAPEX) 1 MG tablet TAKE 1 TABLET (1 MG) BY ORAL ROUTE DAILY 90 tablet 1  . valACYclovir (VALTREX) 500 MG tablet TAKE 1 TABLET BY MOUTH EVERY DAY    . pantoprazole (PROTONIX) 40 MG tablet TAKE 1 TABLET BY MOUTH EVERY DAY 90 tablet 1   No facility-administered medications prior to visit.    Allergies  Allergen Reactions  . Codeine Anaphylaxis and Nausea Only  . Ambien [Zolpidem Tartrate] Other (See Comments)    Memory loss per patient  . Augmentin [Amoxicillin-Pot Clavulanate] Nausea And Vomiting  . Cyproheptadine Hcl Other (See Comments)    Confusion  . Dilaudid [Hydromorphone]     hallucinations  . Hydrocodone Other (See Comments)    anaphylaxis  . Lisinopril   . Oxycodone     Anaphylaxis  . Prednisone   . Sulfa Antibiotics Other (See Comments)    hives  . Tramadol     Review of Systems  Constitutional: Negative for chills, fatigue and fever.  HENT: Negative for congestion, ear pain, rhinorrhea and sore throat.   Respiratory: Negative for cough and shortness of breath.   Cardiovascular: Negative for chest pain.  Gastrointestinal: Negative for vomiting.  Musculoskeletal: Positive for arthralgias (rt knee pain).       Objective:     Physical Exam Vitals reviewed.  Constitutional:      Appearance: Normal appearance. She is normal weight.  Cardiovascular:     Rate and Rhythm: Normal rate and regular rhythm.     Pulses: Normal pulses.     Heart sounds: Normal heart  sounds.  Pulmonary:     Effort: Pulmonary effort is normal. No respiratory distress.     Breath sounds: Normal breath sounds.  Abdominal:     General: Abdomen is flat. Bowel sounds are normal.     Palpations: Abdomen is soft.     Tenderness: There is no abdominal tenderness.  Neurological:     Mental Status: She is alert and oriented to person, place, and time.  Psychiatric:        Mood and Affect: Mood normal.        Behavior: Behavior normal.     BP (!) 130/74   Pulse 71   Temp (!) 95.1 F (35.1 C)   Ht 5' 1.5" (1.562 m)   Wt 147 lb (66.7 kg)   LMP 08/22/2005   SpO2 98%   BMI 27.33 kg/m  Wt Readings from Last 3 Encounters:  03/20/20 147 lb (66.7 kg)  01/02/20 143 lb (64.9 kg)  12/11/19 143 lb (64.9 kg)    Health Maintenance Due  Topic Date Due  . TETANUS/TDAP  Never done  . PNA vac Low Risk Adult (1 of 2 - PCV13) Never done  . MAMMOGRAM  05/23/2018  . COVID-19 Vaccine (2 - Pfizer 2-dose series) 01/02/2020  . INFLUENZA VACCINE  03/22/2020    There are no preventive care reminders to display for this patient.   Lab Results  Component Value Date   TSH 2.540 03/20/2020   Lab Results  Component Value Date   WBC 5.6 01/02/2020   HGB 14.5 01/02/2020   HCT 43.4 01/02/2020   MCV 89 01/02/2020   PLT 302 01/02/2020   Lab Results  Component Value Date   NA 142 03/20/2020   K 4.5 03/20/2020   CO2 23 03/20/2020   GLUCOSE 88 03/20/2020   BUN 15 03/20/2020   CREATININE 0.69 03/20/2020   BILITOT 0.4 03/20/2020   ALKPHOS 99 03/20/2020   AST 27 03/20/2020   ALT 32 03/20/2020   PROT 6.9 03/20/2020   ALBUMIN 4.3 03/20/2020   CALCIUM 10.0 03/20/2020   Lab Results  Component Value Date   CHOL 123 02/18/2013   Lab Results   Component Value Date   HDL 59 02/18/2013   Lab Results  Component Value Date   LDLCALC 48 02/18/2013   Lab Results  Component Value Date   TRIG 82 02/18/2013   Lab Results  Component Value Date   CHOLHDL 2.1 02/18/2013   Lab Results  Component Value Date   HGBA1C 5.7 (H) 12/11/2019       Assessment & Plan:  1. Psoriatic arthritis ([place]) Continue mgmt by rheumatology. - Comprehensive metabolic panel  2. Essential hypertension The current medical regimen is effective;  continue present plan and medications. - CBC with Differential/Platelet  3. Secondary hypothyroidism Check tsh. 4. Diaphoresis - TSH Start on robinul forte. Side effects from this medicine can by decreased sweating, so will give this trial.    Meds ordered this encounter  Medications  . glycopyrrolate (ROBINUL) 2 MG tablet    Sig: Take 1 tablet (2 mg total) by mouth 3 (three) times daily.    Dispense:  90 tablet    Refill:  0    Orders Placed This Encounter  Procedures  . Comprehensive metabolic panel  . CBC with Differential/Platelet  . TSH  . TSH  . Specimen status report     Follow-up: No follow-ups on file.  An After Visit Summary was printed and given to the patient.  Rochel Brome [facility] [phone]

## 2020-03-20 ENCOUNTER — Encounter: Payer: Self-pay | Admitting: Family Medicine

## 2020-03-20 ENCOUNTER — Other Ambulatory Visit: Payer: Self-pay

## 2020-03-20 ENCOUNTER — Ambulatory Visit (INDEPENDENT_AMBULATORY_CARE_PROVIDER_SITE_OTHER): Payer: Medicare HMO | Admitting: Family Medicine

## 2020-03-20 VITALS — BP 130/74 | HR 71 | Temp 95.1°F | Ht 61.5 in | Wt 147.0 lb

## 2020-03-20 DIAGNOSIS — R61 Generalized hyperhidrosis: Secondary | ICD-10-CM

## 2020-03-20 DIAGNOSIS — L4050 Arthropathic psoriasis, unspecified: Secondary | ICD-10-CM | POA: Diagnosis not present

## 2020-03-20 DIAGNOSIS — E038 Other specified hypothyroidism: Secondary | ICD-10-CM

## 2020-03-20 DIAGNOSIS — I10 Essential (primary) hypertension: Secondary | ICD-10-CM | POA: Diagnosis not present

## 2020-03-20 LAB — COMPREHENSIVE METABOLIC PANEL
ALT: 32 IU/L (ref 0–32)
AST: 27 IU/L (ref 0–40)
Albumin/Globulin Ratio: 1.7 (ref 1.2–2.2)
Albumin: 4.3 g/dL (ref 3.8–4.8)
Alkaline Phosphatase: 99 IU/L (ref 48–121)
BUN/Creatinine Ratio: 22 (ref 12–28)
BUN: 15 mg/dL (ref 8–27)
Bilirubin Total: 0.4 mg/dL (ref 0.0–1.2)
CO2: 23 mmol/L (ref 20–29)
Calcium: 10 mg/dL (ref 8.7–10.3)
Chloride: 104 mmol/L (ref 96–106)
Creatinine, Ser: 0.69 mg/dL (ref 0.57–1.00)
GFR calc Af Amer: 102 mL/min/{1.73_m2} (ref >59)
GFR calc non Af Amer: 88 mL/min/{1.73_m2} (ref >59)
Globulin, Total: 2.6 g/dL (ref 1.5–4.5)
Glucose: 88 mg/dL (ref 65–99)
Potassium: 4.5 mmol/L (ref 3.5–5.2)
Sodium: 142 mmol/L (ref 134–144)
Total Protein: 6.9 g/dL (ref 6.0–8.5)

## 2020-03-20 MED ORDER — GLYCOPYRROLATE 2 MG PO TABS: 2.0000 mg | ORAL_TABLET | Freq: Three times a day (TID) | ORAL | 0 refills | Status: DC

## 2020-03-25 ENCOUNTER — Other Ambulatory Visit: Payer: Medicare HMO

## 2020-03-26 ENCOUNTER — Other Ambulatory Visit: Payer: Self-pay | Admitting: Family Medicine

## 2020-03-31 ENCOUNTER — Encounter: Payer: Self-pay | Admitting: Family Medicine

## 2020-03-31 ENCOUNTER — Telehealth: Payer: Self-pay | Admitting: Family Medicine

## 2020-03-31 ENCOUNTER — Other Ambulatory Visit: Payer: Self-pay

## 2020-03-31 ENCOUNTER — Other Ambulatory Visit: Payer: Medicare HMO

## 2020-03-31 DIAGNOSIS — I10 Essential (primary) hypertension: Secondary | ICD-10-CM

## 2020-03-31 DIAGNOSIS — E038 Other specified hypothyroidism: Secondary | ICD-10-CM

## 2020-03-31 DIAGNOSIS — L4050 Arthropathic psoriasis, unspecified: Secondary | ICD-10-CM

## 2020-03-31 DIAGNOSIS — Z1231 Encounter for screening mammogram for malignant neoplasm of breast: Secondary | ICD-10-CM | POA: Diagnosis not present

## 2020-03-31 NOTE — Progress Notes (Signed)
  Chronic Care Management   Outreach Note  03/31/2020 Name: TAHIRIH LAIR MRN: [id number] DOB: August 31, 1950  Referred by: Rochel Brome, MD Reason for referral : No chief complaint on file.   An unsuccessful telephone outreach was attempted today. The patient was referred to the pharmacist for assistance with care management and care coordination.   Follow Up Plan:   Earney [place] [employer] Scheduler

## 2020-04-01 LAB — CBC
Hematocrit: 42.8 % (ref 34.0–46.6)
Hemoglobin: 14 g/dL (ref 11.1–15.9)
MCH: 30.3 pg (ref 26.6–33.0)
MCHC: 32.7 g/dL (ref 31.5–35.7)
MCV: 93 fL (ref 79–97)
Platelets: 282 10*3/uL (ref 150–450)
RBC: 4.62 x10E6/uL (ref 3.77–5.28)
RDW: 14.1 % (ref 11.7–15.4)
WBC: 5.5 10*3/uL (ref 3.4–10.8)

## 2020-04-01 LAB — TSH: TSH: 2.37 u[IU]/mL (ref 0.450–4.500)

## 2020-04-06 DIAGNOSIS — R69 Illness, unspecified: Secondary | ICD-10-CM | POA: Diagnosis not present

## 2020-04-09 DIAGNOSIS — H524 Presbyopia: Secondary | ICD-10-CM | POA: Diagnosis not present

## 2020-04-11 ENCOUNTER — Other Ambulatory Visit: Payer: Self-pay | Admitting: Family Medicine

## 2020-04-14 ENCOUNTER — Telehealth: Payer: Self-pay

## 2020-04-14 DIAGNOSIS — M170 Bilateral primary osteoarthritis of knee: Secondary | ICD-10-CM | POA: Diagnosis not present

## 2020-04-14 NOTE — Telephone Encounter (Signed)
Patient called stating that she discontinued Robinul due to it making her too dry. She did not request anything in its place just wanted you to be aware.

## 2020-04-16 ENCOUNTER — Other Ambulatory Visit: Payer: Self-pay | Admitting: Family Medicine

## 2020-04-20 DIAGNOSIS — M170 Bilateral primary osteoarthritis of knee: Secondary | ICD-10-CM | POA: Diagnosis not present

## 2020-04-22 ENCOUNTER — Other Ambulatory Visit: Payer: Self-pay

## 2020-04-22 ENCOUNTER — Ambulatory Visit: Payer: Medicare HMO

## 2020-04-22 DIAGNOSIS — I10 Essential (primary) hypertension: Secondary | ICD-10-CM

## 2020-04-22 DIAGNOSIS — E782 Mixed hyperlipidemia: Secondary | ICD-10-CM

## 2020-04-22 NOTE — Chronic Care Management (AMB) (Signed)
Chronic Care Management Pharmacy  Name: Olivia Cruz  MRN: [id number] DOB: [birth year]  Chief Complaint/ HPI  Olivia Cruz,  69 y.o. , female presents for their Initial CCM visit with the clinical pharmacist via telephone due to COVID-19 Pandemic.  PCP : Rochel Brome, MD  Their chronic conditions include: hypertension, hypothyroidism, neuropathy, psoriatic arthritis, prediabetes, hyperlipidemia, RLS.   Office Visits:  03/20/2020 -  Diaphoresis begin robinul forte trial. Labs checked. CBC and TSH normal.   01/02/2020 - decreased pedal pulses order ankle brachial index assessment.   12/11/2019 - stop losartan and check bp daily. Start allegra and speech therapy exercises.   10/28/2019 - neuropathy improved. Continue management with rheumatology.  Consult Visit:  04/20/2020 - Pain management - proceed with nerve block. Trial a short course oxycodone. If she doesn't tolerate oxycodone, may revisit a trial of dilaudid. Could consider gabapentin 800 mg tid. Supplement for postop pain with tylenol 1000 mg every 8 hours and NSAIDS.   04/14/2020 - pain management - referral for PT for knee pain. Plan to perform nerve blocks.   02/13/2020 - Orthopedic - We discussed treatment options including continued conservative treatment with Tylenol and bracing. We also discussed total knee replacement, but at this time she does not feel she is ready to undergo this. For now she will work on quad and hamstring strengthening and continue to use Tylenol and her brace as needed. She will follow up as needed. Patient is in agreement with this plan and all questions were answered.  01/30/2020 - optometry  12/26/2019 - Orthopedics - ordered trigger finger injection.  12/19/2019 - Rheumatology - continue methotrexate and leucovorin.  11/08/2019 - Rheumatology -  Continue methotrexate.   11/05/2019 - Dermatology visit - note not available.   10/28/2019 - Gynecology - Pap smear. Valtrex bid x3  days for outbreaks. Macrobid post coitally.   Medications: Outpatient Encounter Medications as of 04/22/2020  Medication Sig  . acetaminophen (TYLENOL) 325 MG tablet Take by mouth every 6 (six) hours as needed.   Marland Kitchen albuterol (PROAIR HFA) 108 (90 Base) MCG/ACT inhaler Inhale two puffs every four to six hours as needed for cough or wheeze.  . budesonide (RHINOCORT ALLERGY) 32 MCG/ACT nasal spray Place 1 spray into both nostrils daily. Reported on 02/04/2016  . Calcium Carbonate-Vitamin D (CALCIUM + D PO) Take 1,200 mg by mouth daily.  . cholecalciferol (VITAMIN D3) 25 MCG (1000 UNIT) tablet Take 1,000 Units by mouth in the morning, at noon, and at bedtime.  . citalopram (CELEXA) 20 MG tablet TAKE 1 TABLET BY MOUTH EVERY DAY AS DIRECTED  . folic acid (FOLVITE) 1 MG tablet Take 1 mg by mouth daily.   Marland Kitchen leucovorin (WELLCOVORIN) 10 MG tablet PLEASE SEE ATTACHED FOR DETAILED DIRECTIONS  . levothyroxine (SYNTHROID) 50 MCG tablet TAKE 1 TABLET BY MOUTH EVERY DAY  . Magnesium 250 MG TABS Take 250 mg by mouth daily.  . methotrexate (RHEUMATREX) 2.5 MG tablet TAKE 6 TABLETS (15 MG TOTAL) BY MOUTH ONCE A WEEK FOR 30 DAYS.  Marland Kitchen Multiple Vitamin (MULTIVITAMIN) capsule Take 1 capsule by mouth daily.  . Multiple Vitamins-Minerals (HAIR SKIN AND NAILS FORMULA PO) Take by mouth daily.  . nitrofurantoin, macrocrystal-monohydrate, (MACROBID) 100 MG capsule 1 po orally, post coitally  . Omega-3 Fatty Acids (SUPER OMEGA 3 EPA/DHA) 1000 MG CAPS Take 1,000 mg by mouth daily.   Marland Kitchen oxycodone (OXY-IR) 5 MG capsule Take 5 mg by mouth every 8 (eight) hours as needed.  . pantoprazole (  PROTONIX) 40 MG tablet TAKE 1 TABLET BY MOUTH EVERY DAY  . pramipexole (MIRAPEX) 1 MG tablet TAKE 1 TABLET (1 MG) BY ORAL ROUTE DAILY  . valACYclovir (VALTREX) 500 MG tablet daily as needed.   Marland Kitchen losartan (COZAAR) 25 MG tablet Take 1 tablet (25 mg total) by mouth daily. (Patient not taking: Reported on 04/22/2020)   No facility-administered  encounter medications on file as of 04/22/2020.   Allergies  Allergen Reactions  . Codeine Anaphylaxis and Nausea Only  . Ambien [Zolpidem Tartrate] Other (See Comments)    Memory loss per patient  . Augmentin [Amoxicillin-Pot Clavulanate] Nausea And Vomiting  . Cyproheptadine Hcl Other (See Comments)    Confusion  . Dilaudid [Hydromorphone]     hallucinations  . Hydrocodone Other (See Comments)    anaphylaxis  . Lisinopril   . Oxycodone     Anaphylaxis  . Prednisone   . Sulfa Antibiotics Other (See Comments)    hives  . Tramadol    SDOH Screenings   Alcohol Screen:   . Last Alcohol Screening Score (AUDIT): Not on file  Depression (PHQ2-9): Low Risk   . PHQ-2 Score: 0  Financial Resource Strain:   . Difficulty of Paying Living Expenses: Not on file  Food Insecurity: No Food Insecurity  . Worried About Charity fundraiser in the Last Year: Never true  . Ran Out of Food in the Last Year: Never true  Housing: Low Risk   . Last Housing Risk Score: 0  Physical Activity:   . Days of Exercise per Week: Not on file  . Minutes of Exercise per Session: Not on file  Social Connections:   . Frequency of Communication with Friends and Family: Not on file  . Frequency of Social Gatherings with Friends and Family: Not on file  . Attends Religious Services: Not on file  . Active Member of Clubs or Organizations: Not on file  . Attends Archivist Meetings: Not on file  . Marital Status: Not on file  Stress:   . Feeling of Stress : Not on file  Tobacco Use: Medium Risk  . Smoking Tobacco Use: Former Smoker  . Smokeless Tobacco Use: Never Used  Transportation Needs: No Transportation Needs  . Lack of Transportation (Medical): No  . Lack of Transportation (Non-Medical): No     Current Diagnosis/Assessment:  Goals Addressed            This Visit's Progress   . Pharmacy Care Plan       CARE PLAN ENTRY  Current Barriers:  . Chronic Disease Management support,  education, and care coordination needs related to Hypertension and Hyperlipidemia   Hypertension BP Readings from Last 3 Encounters:  01/02/20 116/68  12/11/19 102/64  10/28/19 116/72   . Pharmacist Clinical Goal(s): o Over the next 90 days, patient will work with PharmD and providers to maintain BP goal <130/80 . Current regimen:  o Diet and Lifestyle . Interventions: o Continue healthy habits of riding bicycle at the [facility] and remaining active.  o Reviewed medication regimen and lifestyle modifications.  . Patient self care activities - Over the next 90 days, patient will: o Check BP as needed, document, and provide at future appointments o Ensure daily salt intake < 2300 mg/day  Hyperlipidemia Lab Results  Component Value Date/Time   LDLCALC 48 02/18/2013 10:29 AM   . Pharmacist Clinical Goal(s): o Over the next 90 days, patient will work with PharmD and providers to maintain LDL goal <  70 . Current regimen:  o Omega-3 2000 mg daily . Interventions: o Recommend healthy diet of lean protein, vegetables and fruit.  o Recommend updated lipid panel drawn with next lab work.  . Patient self care activities - Over the next 90 days, patient will: o Continue to take medication as prescribed.  o Contact provider or pharmacist with any questions or concerns.   Prediabetes Lab Results  Component Value Date/Time   HGBA1C 5.7 (H) 12/11/2019 09:44 AM   . Pharmacist Clinical Goal(s): o Over the next 90 days, patient will work with PharmD and providers to maintain A1c goal <6.5% . Current regimen:  o Diet and Lifestyle  . Interventions: o Recommend patient continue to exercise and eat healthy diet.  . Patient self care activities - Over the next 90 days, patient will: o Contact provider with any episodes of hypoglycemia  Medication management . Pharmacist Clinical Goal(s): o Over the next 90 days, patient will work with PharmD and providers to maintain optimal medication  adherence . Current [facility]: [facility] . Interventions o Comprehensive medication review performed. o Continue current medication management strategy . Patient self care activities - Over the next 90 days, patient will: o Focus on medication adherence by pill box o Take medications as prescribed o Report any questions or concerns to PharmD and/or provider(s)  Initial goal documentation        Allergic rhinoconjunctivitis/Asthma   Eosinophil count:  No results found for: EOSPCT%                               Eos (Absolute):  Lab Results  Component Value Date/Time   EOSABS 0.1 01/02/2020 09:36 AM    Tobacco Status:  Social History   Tobacco Use  Smoking Status Former Smoker  . Quit date: 2000  . Years since quitting: 21.6  Smokeless Tobacco Never Used    Patient has failed these meds in past: symbicort, cetirizine, fexofenadine, astelin Patient is currently controlled on the following medications:   Albuterol inhaler 2 puffs every 4 hours prn cough or wheeze   Rhinocort allergy 32 mcg/act 1 spray into both nostrils daily prn Using maintenance inhaler regularly? No Frequency of rescue inhaler use:  infrequently  We discussed:  Patient is rarely using either of her PRN medications. If she notices a cough or going to be in a dusty environment, she uses them as needed. Reports good control.   Plan  Continue current medications   Prediabetes   Recent Relevant Labs: Lab Results  Component Value Date/Time   HGBA1C 5.7 (H) 12/11/2019 09:44 AM     Checking BG: Rarely  Recent FBG Readings: 100-110 mg/dL  Patient has failed these meds in past: n/a Patient is currently controlled on the following medications: diet/lifestyle  We discussed: diet and exercise extensively  Plan  Continue control with diet and exercise and  Hypertension   BP today is:  <130/80  Office blood pressures are  BP Readings from Last 3 Encounters:  03/20/20 (!) 130/74  01/02/20 116/68    12/11/19 102/64    Patient has failed these meds in the past: lisionpril, losartan Patient is currently controlled on the following medications:   Lifestyle  Patient checks BP at home several times per month  Patient home BP readings are ranging: 120/70  We discussed diet and exercise extensively  Plan  Continue control with diet and exercise   Hyperlipidemia   LDL goal <  100  Lipid Panel     Component Value Date/Time   CHOL 123 02/18/2013 1029   TRIG 82 02/18/2013 1029   HDL 59 02/18/2013 1029   LDLCALC 48 02/18/2013 1029    Hepatic Function Latest Ref Rng & Units 03/20/2020 01/02/2020 12/02/2019  Total Protein 6.0 - 8.5 g/dL 6.9 6.5 6.6  Albumin 3.8 - 4.8 g/dL 4.3 4.3 4.4  AST 0 - 40 IU/L '27 20 22  ' ALT 0 - 32 IU/L 32 16 15  Alk Phosphatase 48 - 121 IU/L 99 85 74  Total Bilirubin 0.0 - 1.2 mg/dL 0.4 0.3 0.3  Bilirubin, Direct 0.00 - 0.40 mg/dL - - 0.10     The ASCVD Risk score Mikey Bussing DC Jr., et al., 2013) failed to calculate for the following reasons:   Cannot find a previous HDL lab   Cannot find a previous total cholesterol lab   Patient has failed these meds in past: n/a  Patient is currently query controlled on the following medications:  . Omega-3 2000 mg daily  We discussed:  diet and exercise extensively. Last lipid panel has been some time ago. Patient reports always in good control due to her lifestyle/diet and fish oil. Discussed updating lipid panel with next lab work.   Plan  Continue control with diet and exercise   Hypothyroidism   Lab Results  Component Value Date/Time   TSH 2.370 03/31/2020 09:34 AM   TSH 3.489 02/18/2013 10:29 AM    Patient has failed these meds in past: n/a Patient is currently controlled on the following medications:  . Levothyroxine 50 mcg daily   We discussed:   Patient feels that her thyroid could be off making her gain weight. Her TSH is well controlled but patient has been looking online at her symptoms and feel  that they could be thyroid related. Patient is experiencing thinning and dry hair which she does acknowledge a possibility of coming from methotrexate therapy.   Plan  Continue current medications. Consider full thyroid panel with next lab draw.   GERD   Patient has failed these meds in past: n/a Patient is currently controlled on the following medications:  . pantoprazole 40 mg daily   We discussed:  Patient denies any symptoms of GERD. States that her hoarseness was coming from a projection issue vs. Acid. Denies a history of ulcer or acid irritation. Patient would like to consider tapering off of pantoprazole with Dr. Alyse Low approval. Patient is concerned that it could be affecting her bone health.   Plan  Consider tapering pantoprazole to every other day to evaluate breakthrough symptoms if Dr. Tobie Poet approves.     Anxiety    Patient has failed these meds in past: n/a Patient is currently controlled on the following medications:  . citalopram 20 mg daily   We discussed:  Patient reports increased sweating and night sweats lately. She is thinking it could be related to citalopram. Originally during our conversation, patient was hoping to discontinue medication. Discussed risk benefit of reducing or removing medication. Patient has some family stressors that cause increase in anxiety (sister and daughter/granddaughter moving out).  Patient also indicates that she is likely facing knee replacement surgery and opted to delay considering reducing dose or weaning off. Patient will let Dr. Tobie Poet or pharmacist know if she chooses to discontinue medication.    Plan  Continue current medications   Osteopenia / Osteoporosis   Last DEXA Scan: 12/01/2017  T-Score femoral neck: -2.0  T-Score lumbar spine: -  0.9  10-year probability of major osteoporotic fracture: 18.1%  10-year probability of hip fracture: 3.6%  Vit D, 25-Hydroxy  Date Value Ref Range Status  02/18/2013 60 30 - 89 ng/mL  Final    Comment:    This assay accurately quantifies Vitamin D, which is the sum of the 25-Hydroxy forms of Vitamin D2 and D3.  Studies have shown that the optimum concentration of 25-Hydroxy Vitamin D is 30 ng/mL or higher.  Concentrations of Vitamin D between 20 and 29 ng/mL are considered to be insufficient and concentrations less than 20 ng/mL are considered to be deficient for Vitamin D.     Patient is a candidate for pharmacologic treatment due to T-Score -1.0 to -2.5 and 10-year risk of hip fracture > 3%  Patient has failed these meds in past: n/a Patient is currently controlled on the following medications:  . Calcium carbonate-Vitamin D  1200 mg daily . Vitamin D 1000 units tid  We discussed:  Recommend [phone] units of vitamin D daily. Recommend 1200 mg of calcium daily from dietary and supplemental sources. Recommend weight-bearing and muscle strengthening exercises for building and maintaining bone density.   Patient currently taking 2 calciums at bedtime. Discussed the body's ability to absorb calcium and limitations. Patient drinks almond milk and enjoys dark leafy greens, yogurt and cottage cheese. She is going to change calcium to 1 tablet in the evening and keep a log of calcium containing foods in her diet to determine if second dose is necessary.   Patient's last DEXA scan was in 2019 and could be repeated but patient would like to defer until after decision orthopedic appointment at the end of the month.   Plan  Continue current medications   Psoriatic Arthritis   Patient has failed these meds in past: n/a Patient is currently controlled on the following medications:  . Leucovirin 10 mg 24 hours after methotrexate dose . Methotrexate 2.5 mg 6 tablets weekly on Thursdays . Folic acid 1 mg daily  We discussed:  Patient reports good control of symptoms. Therapy monitored by rheumatology. Patient has discontinued therapy for short-term in the past and notices  symptoms of stiffness. Follows up with Dr. Manuella Ghazi this month.   Plan  Continue current medications    Health Maintenance   Patient is currently controlled on the following medications:  . Acetaminophen 325 mg prn - patient not currently taking but has been recommended to take 1000 mg three times daily for pain control  . Macrobid 100 mg 1 capsule post coitally - prn preventative . Valtrex 500 mg daily - prn treatment for flares . Pramipexole 1 mg daily - restless leg syndrome . Multiple vitamin daily - supplementation . Hair/Skin/Nails - supplementation . Magnesium 250 mg daily - supplementation  We discussed:  Patient's pain specialist is completing a trial of oxycodone to determine available pain relief options for post-op. Discussed the benefit of NSAID and Tylenol 1000 mg tid. Patient is on methotrexate and aware of the interaction. She plans to discuss with Dr. Manuella Ghazi and if proceeds to a knee replacement may discontinue methotrexate short-term.   Patient does not feel that her restless leg medication is completely covering symptoms and could also be causing her some problems with vivid dreams, sweating and weight gain. She indicates that her legs and arms cramp up and then have involuntary movements. She would like to consider an alternative if possible.   Lifestyle:    Patient reports decaf coffee/tea. Occasionally has regular tea or  pepsi but not daily.   Patient reports that her sleep is interrupted by her knee aching some nights. She gets up to use the restroom once a night.   Patient previously exercised walking 3 miles a day but has had to stop due to her knee pain. She hopes to resume after injection/ablation/surgery  Patient has had good success with Weight Watchers in the past. She got off track on vacation and has gained 8 of her 15 lbs back. She hopes to incorporate more fruits and vegetables in her diet and resume weight watchers.   Plan  Continue current  medications  Vaccines   Reviewed and discussed patient's vaccination history.  Patient does not recall receiving the latest Shingrix shot listed on her history. She plans to check with pharmacy to verify.   Immunization History  Administered Date(s) Administered  . DTaP 12/03/2012  . PFIZER SARS-COV-2 Vaccination 12/12/2019  . Zoster 02/19/2011  . Zoster Recombinat (Shingrix) 05/18/2018    Plan  Recommended patient receive annual flu vaccine in office.   Medication Management   [facility] Uses pill box? Yes Pt endorses excellent compliance  We discussed: Patient takes medications out of her pill box. She denies missed doses. Medications are affordable [facility]. Patient and her husband both still drive and enjoy traveling.   Plan  Continue current medication management strategy    Follow up: 1 month phone visit

## 2020-04-23 NOTE — Patient Instructions (Addendum)
Visit Information  Thank you for your time discussing your medications. I look forward to working with you to achieve your health care goals. Below is a summary of what we talked about during our visit.   Goals Addressed            This Visit's Progress   . Pharmacy Care Plan       CARE PLAN ENTRY  Current Barriers:  . Chronic Disease Management support, education, and care coordination needs related to Hypertension and Hyperlipidemia   Hypertension BP Readings from Last 3 Encounters:  01/02/20 116/68  12/11/19 102/64  10/28/19 116/72   . Pharmacist Clinical Goal(s): o Over the next 90 days, patient will work with PharmD and providers to maintain BP goal <130/80 . Current regimen:  o Diet and Lifestyle . Interventions: o Continue healthy habits of riding bicycle at the [facility] and remaining active.  o Reviewed medication regimen and lifestyle modifications.  . Patient self care activities - Over the next 90 days, patient will: o Check BP as needed, document, and provide at future appointments o Ensure daily salt intake < 2300 mg/day  Hyperlipidemia Lab Results  Component Value Date/Time   LDLCALC 48 02/18/2013 10:29 AM   . Pharmacist Clinical Goal(s): o Over the next 90 days, patient will work with PharmD and providers to maintain LDL goal < 70 . Current regimen:  o Omega-3 2000 mg daily . Interventions: o Recommend healthy diet of lean protein, vegetables and fruit.  o Recommend updated lipid panel drawn with next lab work.  . Patient self care activities - Over the next 90 days, patient will: o Continue to take medication as prescribed.  o Contact provider or pharmacist with any questions or concerns.   Prediabetes Lab Results  Component Value Date/Time   HGBA1C 5.7 (H) 12/11/2019 09:44 AM   . Pharmacist Clinical Goal(s): o Over the next 90 days, patient will work with PharmD and providers to maintain A1c goal <6.5% . Current regimen:  o Diet and Lifestyle   . Interventions: o Recommend patient continue to exercise and eat healthy diet.  . Patient self care activities - Over the next 90 days, patient will: o Contact provider with any episodes of hypoglycemia  Medication management . Pharmacist Clinical Goal(s): o Over the next 90 days, patient will work with PharmD and providers to maintain optimal medication adherence . Current pharmacy: [facility] . Interventions o Comprehensive medication review performed. o Continue current medication management strategy . Patient self care activities - Over the next 90 days, patient will: o Focus on medication adherence by pill box o Take medications as prescribed o Report any questions or concerns to PharmD and/or provider(s)  Initial goal documentation        Olivia Cruz was given information about Chronic Care Management services today including:  1. CCM service includes personalized support from designated clinical staff supervised by her physician, including individualized plan of care and coordination with other care providers 2. 24/7 contact phone numbers for assistance for urgent and routine care needs. 3. Standard insurance, coinsurance, copays and deductibles apply for chronic care management only during months in which we provide at least 20 minutes of these services. Most insurances cover these services at 100%, however patients may be responsible for any copay, coinsurance and/or deductible if applicable. This service may help you avoid the need for more expensive face-to-face services. 4. Only one practitioner may furnish and bill the service in a calendar month. 5. The patient may  stop CCM services at any time (effective at the end of the month) by phone call to the office staff.  Patient agreed to services and verbal consent obtained.   The patient verbalized understanding of instructions provided today and agreed to receive a mailed copy of patient instruction and/or educational  materials. Telephone follow up appointment with pharmacy team member scheduled for:05/2020  Sherre Poot, PharmD Clinical Pharmacist [place] [phone] (office) [phone] (mobile)  DASH Eating Plan DASH stands for "Dietary Approaches to Stop Hypertension." The DASH eating plan is a healthy eating plan that has been shown to reduce high blood pressure (hypertension). It may also reduce your risk for type 2 diabetes, heart disease, and stroke. The DASH eating plan may also help with weight loss. What are tips for following this plan?  General guidelines  Avoid eating more than 2,300 mg (milligrams) of salt (sodium) a day. If you have hypertension, you may need to reduce your sodium intake to 1,500 mg a day.  Limit alcohol intake to no more than 1 drink a day for nonpregnant women and 2 drinks a day for men. One drink equals 12 oz of beer, 5 oz of wine, or 1 oz of hard liquor.  Work with your health care provider to maintain a healthy body weight or to lose weight. Ask what an ideal weight is for you.  Get at least 30 minutes of exercise that causes your heart to beat faster (aerobic exercise) most days of the week. Activities may include walking, swimming, or biking.  Work with your health care provider or diet and nutrition specialist (dietitian) to adjust your eating plan to your individual calorie needs. Reading food labels   Check food labels for the amount of sodium per serving. Choose foods with less than 5 percent of the Daily Value of sodium. Generally, foods with less than 300 mg of sodium per serving fit into this eating plan.  To find whole grains, look for the word "whole" as the first word in the ingredient list. Shopping  Buy products labeled as "low-sodium" or "no salt added."  Buy fresh foods. Avoid canned foods and premade or frozen meals. Cooking  Avoid adding salt when cooking. Use salt-free seasonings or herbs instead of table salt or sea  salt. Check with your health care provider or pharmacist before using salt substitutes.  Do not fry foods. Cook foods using healthy methods such as baking, boiling, grilling, and broiling instead.  Cook with heart-healthy oils, such as olive, canola, soybean, or sunflower oil. Meal planning  Eat a balanced diet that includes: ? 5 or more servings of fruits and vegetables each day. At each meal, try to fill half of your plate with fruits and vegetables. ? Up to 6-8 servings of whole grains each day. ? Less than 6 oz of lean meat, poultry, or fish each day. A 3-oz serving of meat is about the same size as a deck of cards. One egg equals 1 oz. ? 2 servings of low-fat dairy each day. ? A serving of nuts, seeds, or beans 5 times each week. ? Heart-healthy fats. Healthy fats called Omega-3 fatty acids are found in foods such as flaxseeds and coldwater fish, like sardines, salmon, and mackerel.  Limit how much you eat of the following: ? Canned or prepackaged foods. ? Food that is high in trans fat, such as fried foods. ? Food that is high in saturated fat, such as fatty meat. ? Sweets, desserts, sugary drinks,  and other foods with added sugar. ? Full-fat dairy products.  Do not salt foods before eating.  Try to eat at least 2 vegetarian meals each week.  Eat more home-cooked food and less restaurant, buffet, and fast food.  When eating at a restaurant, ask that your food be prepared with less salt or no salt, if possible. What foods are recommended? The items listed may not be a complete list. Talk with your dietitian about what dietary choices are best for you. Grains Whole-grain or whole-wheat bread. Whole-grain or whole-wheat pasta. [place]ako Pelfrey rice. [place]. Bulgur. Whole-grain and low-sodium cereals. Pita bread. Low-fat, low-sodium crackers. Whole-wheat flour tortillas. Vegetables Fresh or frozen vegetables (raw, steamed, roasted, or grilled). Low-sodium or reduced-sodium tomato  and vegetable juice. Low-sodium or reduced-sodium tomato sauce and tomato paste. Low-sodium or reduced-sodium canned vegetables. Fruits All fresh, dried, or frozen fruit. Canned fruit in natural juice (without added sugar). Meat and other protein foods Skinless chicken or [place]. Ground chicken or [place]. Pork with fat trimmed off. Fish and seafood. Egg whites. Dried beans, peas, or lentils. Unsalted nuts, nut butters, and seeds. Unsalted canned beans. Lean cuts of beef with fat trimmed off. Low-sodium, lean deli meat. Dairy Low-fat (1%) or fat-free (skim) milk. Fat-free, low-fat, or reduced-fat cheeses. Nonfat, low-sodium ricotta or cottage cheese. Low-fat or nonfat yogurt. Low-fat, low-sodium cheese. Fats and oils Soft margarine without trans fats. Vegetable oil. Low-fat, reduced-fat, or light mayonnaise and salad dressings (reduced-sodium). Canola, safflower, olive, soybean, and sunflower oils. Avocado. Seasoning and other foods Herbs. Spices. Seasoning mixes without salt. Unsalted popcorn and pretzels. Fat-free sweets. What foods are not recommended? The items listed may not be a complete list. Talk with your dietitian about what dietary choices are best for you. Grains Baked goods made with fat, such as croissants, muffins, or some breads. Dry pasta or rice meal packs. Vegetables Creamed or fried vegetables. Vegetables in a cheese sauce. Regular canned vegetables (not low-sodium or reduced-sodium). Regular canned tomato sauce and paste (not low-sodium or reduced-sodium). Regular tomato and vegetable juice (not low-sodium or reduced-sodium). Angie Fava. Olives. Fruits Canned fruit in a light or heavy syrup. Fried fruit. Fruit in cream or butter sauce. Meat and other protein foods Fatty cuts of meat. Ribs. Fried meat. Berniece Salines. Sausage. Bologna and other processed lunch meats. Salami. Fatback. Hotdogs. Bratwurst. Salted nuts and seeds. Canned beans with added salt. Canned or smoked fish. Whole eggs  or egg yolks. Chicken or [place] with skin. Dairy Whole or 2% milk, cream, and half-and-half. Whole or full-fat cream cheese. Whole-fat or sweetened yogurt. Full-fat cheese. Nondairy creamers. Whipped toppings. Processed cheese and cheese spreads. Fats and oils Butter. Stick margarine. Lard. Shortening. Ghee. Bacon fat. Tropical oils, such as coconut, palm kernel, or palm oil. Seasoning and other foods Salted popcorn and pretzels. Onion salt, garlic salt, seasoned salt, table salt, and sea salt. Worcestershire sauce. Tartar sauce. Barbecue sauce. Teriyaki sauce. Soy sauce, including reduced-sodium. Steak sauce. Canned and packaged gravies. Fish sauce. Oyster sauce. Cocktail sauce. Horseradish that you find on the shelf. Ketchup. Mustard. Meat flavorings and tenderizers. Bouillon cubes. Hot sauce and Tabasco sauce. Premade or packaged marinades. Premade or packaged taco seasonings. Relishes. Regular salad dressings. Where to find more information:  National Heart, Lung, and [place]: https://wilson-eaton.com/  American Heart Association: www.heart.org Summary  The DASH eating plan is a healthy eating plan that has been shown to reduce high blood pressure (hypertension). It may also reduce your risk for type 2 diabetes, heart disease, and stroke.  With the DASH eating plan, you should limit salt (sodium) intake to 2,300 mg a day. If you have hypertension, you may need to reduce your sodium intake to 1,500 mg a day.  When on the DASH eating plan, aim to eat more fresh fruits and vegetables, whole grains, lean proteins, low-fat dairy, and heart-healthy fats.  Work with your health care provider or diet and nutrition specialist (dietitian) to adjust your eating plan to your individual calorie needs. This information is not intended to replace advice given to you by your health care provider. Make sure you discuss any questions you have with your health care provider. Document Revised: 07/21/2017  Document Reviewed: 08/01/2016 Elsevier Patient Education  2020 [employer].

## 2020-04-28 DIAGNOSIS — R2689 Other abnormalities of gait and mobility: Secondary | ICD-10-CM | POA: Diagnosis not present

## 2020-04-28 DIAGNOSIS — M6281 Muscle weakness (generalized): Secondary | ICD-10-CM | POA: Diagnosis not present

## 2020-04-28 DIAGNOSIS — M170 Bilateral primary osteoarthritis of knee: Secondary | ICD-10-CM | POA: Diagnosis not present

## 2020-04-28 DIAGNOSIS — M25662 Stiffness of left knee, not elsewhere classified: Secondary | ICD-10-CM | POA: Diagnosis not present

## 2020-04-28 DIAGNOSIS — M25561 Pain in right knee: Secondary | ICD-10-CM | POA: Diagnosis not present

## 2020-04-28 DIAGNOSIS — R293 Abnormal posture: Secondary | ICD-10-CM | POA: Diagnosis not present

## 2020-04-28 DIAGNOSIS — M25661 Stiffness of right knee, not elsewhere classified: Secondary | ICD-10-CM | POA: Diagnosis not present

## 2020-04-28 DIAGNOSIS — M25562 Pain in left knee: Secondary | ICD-10-CM | POA: Diagnosis not present

## 2020-04-28 DIAGNOSIS — M25461 Effusion, right knee: Secondary | ICD-10-CM | POA: Diagnosis not present

## 2020-04-29 DIAGNOSIS — Z5181 Encounter for therapeutic drug level monitoring: Secondary | ICD-10-CM | POA: Diagnosis not present

## 2020-04-29 DIAGNOSIS — L4050 Arthropathic psoriasis, unspecified: Secondary | ICD-10-CM | POA: Diagnosis not present

## 2020-04-29 DIAGNOSIS — R29898 Other symptoms and signs involving the musculoskeletal system: Secondary | ICD-10-CM | POA: Diagnosis not present

## 2020-04-29 DIAGNOSIS — R05 Cough: Secondary | ICD-10-CM | POA: Diagnosis not present

## 2020-04-29 DIAGNOSIS — Z79899 Other long term (current) drug therapy: Secondary | ICD-10-CM | POA: Diagnosis not present

## 2020-05-04 DIAGNOSIS — M170 Bilateral primary osteoarthritis of knee: Secondary | ICD-10-CM | POA: Diagnosis not present

## 2020-05-04 DIAGNOSIS — M25661 Stiffness of right knee, not elsewhere classified: Secondary | ICD-10-CM | POA: Diagnosis not present

## 2020-05-04 DIAGNOSIS — R05 Cough: Secondary | ICD-10-CM | POA: Diagnosis not present

## 2020-05-04 DIAGNOSIS — R293 Abnormal posture: Secondary | ICD-10-CM | POA: Diagnosis not present

## 2020-05-04 DIAGNOSIS — M25662 Stiffness of left knee, not elsewhere classified: Secondary | ICD-10-CM | POA: Diagnosis not present

## 2020-05-04 DIAGNOSIS — R2689 Other abnormalities of gait and mobility: Secondary | ICD-10-CM | POA: Diagnosis not present

## 2020-05-04 DIAGNOSIS — M25562 Pain in left knee: Secondary | ICD-10-CM | POA: Diagnosis not present

## 2020-05-04 DIAGNOSIS — M25461 Effusion, right knee: Secondary | ICD-10-CM | POA: Diagnosis not present

## 2020-05-04 DIAGNOSIS — M6281 Muscle weakness (generalized): Secondary | ICD-10-CM | POA: Diagnosis not present

## 2020-05-04 DIAGNOSIS — M25561 Pain in right knee: Secondary | ICD-10-CM | POA: Diagnosis not present

## 2020-05-11 ENCOUNTER — Other Ambulatory Visit: Payer: Self-pay | Admitting: Family Medicine

## 2020-05-11 DIAGNOSIS — M25661 Stiffness of right knee, not elsewhere classified: Secondary | ICD-10-CM | POA: Diagnosis not present

## 2020-05-11 DIAGNOSIS — M25561 Pain in right knee: Secondary | ICD-10-CM | POA: Diagnosis not present

## 2020-05-11 DIAGNOSIS — M6281 Muscle weakness (generalized): Secondary | ICD-10-CM | POA: Diagnosis not present

## 2020-05-11 DIAGNOSIS — M170 Bilateral primary osteoarthritis of knee: Secondary | ICD-10-CM | POA: Diagnosis not present

## 2020-05-11 DIAGNOSIS — M25461 Effusion, right knee: Secondary | ICD-10-CM | POA: Diagnosis not present

## 2020-05-11 DIAGNOSIS — R293 Abnormal posture: Secondary | ICD-10-CM | POA: Diagnosis not present

## 2020-05-11 DIAGNOSIS — M25662 Stiffness of left knee, not elsewhere classified: Secondary | ICD-10-CM | POA: Diagnosis not present

## 2020-05-11 DIAGNOSIS — M25562 Pain in left knee: Secondary | ICD-10-CM | POA: Diagnosis not present

## 2020-05-11 DIAGNOSIS — R2689 Other abnormalities of gait and mobility: Secondary | ICD-10-CM | POA: Diagnosis not present

## 2020-05-19 DIAGNOSIS — M25661 Stiffness of right knee, not elsewhere classified: Secondary | ICD-10-CM | POA: Diagnosis not present

## 2020-05-19 DIAGNOSIS — M6281 Muscle weakness (generalized): Secondary | ICD-10-CM | POA: Diagnosis not present

## 2020-05-19 DIAGNOSIS — M25461 Effusion, right knee: Secondary | ICD-10-CM | POA: Diagnosis not present

## 2020-05-19 DIAGNOSIS — M25662 Stiffness of left knee, not elsewhere classified: Secondary | ICD-10-CM | POA: Diagnosis not present

## 2020-05-19 DIAGNOSIS — M170 Bilateral primary osteoarthritis of knee: Secondary | ICD-10-CM | POA: Diagnosis not present

## 2020-05-19 DIAGNOSIS — R293 Abnormal posture: Secondary | ICD-10-CM | POA: Diagnosis not present

## 2020-05-19 DIAGNOSIS — R2689 Other abnormalities of gait and mobility: Secondary | ICD-10-CM | POA: Diagnosis not present

## 2020-05-19 DIAGNOSIS — M25562 Pain in left knee: Secondary | ICD-10-CM | POA: Diagnosis not present

## 2020-05-19 DIAGNOSIS — M25561 Pain in right knee: Secondary | ICD-10-CM | POA: Diagnosis not present

## 2020-05-20 DIAGNOSIS — R7989 Other specified abnormal findings of blood chemistry: Secondary | ICD-10-CM | POA: Diagnosis not present

## 2020-05-20 DIAGNOSIS — Z79899 Other long term (current) drug therapy: Secondary | ICD-10-CM | POA: Diagnosis not present

## 2020-05-20 DIAGNOSIS — Z01818 Encounter for other preprocedural examination: Secondary | ICD-10-CM | POA: Diagnosis not present

## 2020-05-20 DIAGNOSIS — M1711 Unilateral primary osteoarthritis, right knee: Secondary | ICD-10-CM | POA: Diagnosis not present

## 2020-05-22 DIAGNOSIS — M170 Bilateral primary osteoarthritis of knee: Secondary | ICD-10-CM | POA: Diagnosis not present

## 2020-05-25 DIAGNOSIS — M170 Bilateral primary osteoarthritis of knee: Secondary | ICD-10-CM | POA: Diagnosis not present

## 2020-05-27 ENCOUNTER — Telehealth: Payer: Medicare HMO

## 2020-05-27 NOTE — Chronic Care Management (AMB) (Deleted)
Chronic Care Management Pharmacy  Name: Olivia Cruz  MRN: [id number] DOB: 1951/06/27  Chief Complaint/ HPI  Olivia Cruz,  69 y.o. , female presents for their Initial CCM visit with the clinical pharmacist via telephone due to COVID-19 Pandemic.  PCP : Rochel Brome, MD  Their chronic conditions include: hypertension, hypothyroidism, neuropathy, psoriatic arthritis, prediabetes, hyperlipidemia, RLS.   Office Visits:  03/20/2020 -  Diaphoresis begin robinul forte trial. Labs checked. CBC and TSH normal.   01/02/2020 - decreased pedal pulses order ankle brachial index assessment.   12/11/2019 - stop losartan and check bp daily. Start allegra and speech therapy exercises.   10/28/2019 - neuropathy improved. Continue management with rheumatology.  Consult Visit:  05/22/2020 - Surgical services - reviewed total knee replacement.   05/20/2020 - Ortho - right total knee replacement scheduled and education given.  04/29/2020 - Rheumatology - stop folic acid. Ok for COVID booster hold MTX for 1 week after.   04/20/2020 - Pain management - proceed with nerve block. Trial a short course oxycodone. If she doesn't tolerate oxycodone, may revisit a trial of dilaudid. Could consider gabapentin 800 mg tid. Supplement for postop pain with tylenol 1000 mg every 8 hours and NSAIDS.   04/14/2020 - pain management - referral for PT for knee pain. Plan to perform nerve blocks.   02/13/2020 - Orthopedic - We discussed treatment options including continued conservative treatment with Tylenol and bracing. We also discussed total knee replacement, but at this time she does not feel she is ready to undergo this. For now she will work on quad and hamstring strengthening and continue to use Tylenol and her brace as needed. She will follow up as needed. Patient is in agreement with this plan and all questions were answered.  01/30/2020 - optometry  12/26/2019 - Orthopedics - ordered trigger  finger injection.  12/19/2019 - Rheumatology - continue methotrexate and leucovorin.  11/08/2019 - Rheumatology -  Continue methotrexate.   11/05/2019 - Dermatology visit - note not available.   10/28/2019 - Gynecology - Pap smear. Valtrex bid x3 days for outbreaks. Macrobid post coitally.   Medications: Outpatient Encounter Medications as of 05/27/2020  Medication Sig  . acetaminophen (TYLENOL) 325 MG tablet Take by mouth every 6 (six) hours as needed.   Marland Kitchen albuterol (PROAIR HFA) 108 (90 Base) MCG/ACT inhaler Inhale two puffs every four to six hours as needed for cough or wheeze.  . budesonide (RHINOCORT ALLERGY) 32 MCG/ACT nasal spray Place 1 spray into both nostrils daily. Reported on 02/04/2016  . Calcium Carbonate-Vitamin D (CALCIUM + D PO) Take 1,200 mg by mouth daily.  . cholecalciferol (VITAMIN D3) 25 MCG (1000 UNIT) tablet Take 1,000 Units by mouth in the morning, at noon, and at bedtime.  . citalopram (CELEXA) 20 MG tablet TAKE 1 TABLET BY MOUTH EVERY DAY AS DIRECTED  . folic acid (FOLVITE) 1 MG tablet Take 1 mg by mouth daily.   Marland Kitchen leucovorin (WELLCOVORIN) 10 MG tablet PLEASE SEE ATTACHED FOR DETAILED DIRECTIONS  . levothyroxine (SYNTHROID) 50 MCG tablet TAKE 1 TABLET BY MOUTH EVERY DAY  . losartan (COZAAR) 25 MG tablet Take 1 tablet (25 mg total) by mouth daily. (Patient not taking: Reported on 04/22/2020)  . Magnesium 250 MG TABS Take 250 mg by mouth daily.  . methotrexate (RHEUMATREX) 2.5 MG tablet TAKE 6 TABLETS (15 MG TOTAL) BY MOUTH ONCE A WEEK FOR 30 DAYS.  Marland Kitchen Multiple Vitamin (MULTIVITAMIN) capsule Take 1 capsule by mouth daily.  Marland Kitchen  Multiple Vitamins-Minerals (HAIR SKIN AND NAILS FORMULA PO) Take by mouth daily.  . nitrofurantoin, macrocrystal-monohydrate, (MACROBID) 100 MG capsule 1 po orally, post coitally  . Omega-3 Fatty Acids (SUPER OMEGA 3 EPA/DHA) 1000 MG CAPS Take 1,000 mg by mouth daily.   Marland Kitchen oxycodone (OXY-IR) 5 MG capsule Take 5 mg by mouth every 8 (eight) hours as  needed.  . pantoprazole (PROTONIX) 40 MG tablet TAKE 1 TABLET BY MOUTH EVERY DAY  . pramipexole (MIRAPEX) 1 MG tablet TAKE 1 TABLET (1 MG) BY ORAL ROUTE DAILY  . valACYclovir (VALTREX) 500 MG tablet daily as needed.    No facility-administered encounter medications on file as of 05/27/2020.   Allergies  Allergen Reactions  . Codeine Anaphylaxis and Nausea Only  . Ambien [Zolpidem Tartrate] Other (See Comments)    Memory loss per patient  . Augmentin [Amoxicillin-Pot Clavulanate] Nausea And Vomiting  . Cyproheptadine Hcl Other (See Comments)    Confusion  . Dilaudid [Hydromorphone]     hallucinations  . Hydrocodone Other (See Comments)    anaphylaxis  . Lisinopril   . Oxycodone     Anaphylaxis  . Prednisone   . Sulfa Antibiotics Other (See Comments)    hives  . Tramadol    SDOH Screenings   Alcohol Screen:   . Last Alcohol Screening Score (AUDIT): Not on file  Depression (PHQ2-9): Low Risk   . PHQ-2 Score: 0  Financial Resource Strain:   . Difficulty of Paying Living Expenses: Not on file  Food Insecurity: No Food Insecurity  . Worried About Charity fundraiser in the Last Year: Never true  . Ran Out of Food in the Last Year: Never true  Housing: Low Risk   . Last Housing Risk Score: 0  Physical Activity:   . Days of Exercise per Week: Not on file  . Minutes of Exercise per Session: Not on file  Social Connections:   . Frequency of Communication with Friends and Family: Not on file  . Frequency of Social Gatherings with Friends and Family: Not on file  . Attends Religious Services: Not on file  . Active Member of Clubs or Organizations: Not on file  . Attends Archivist Meetings: Not on file  . Marital Status: Not on file  Stress:   . Feeling of Stress : Not on file  Tobacco Use: Medium Risk  . Smoking Tobacco Use: Former Smoker  . Smokeless Tobacco Use: Never Used  Transportation Needs: No Transportation Needs  . Lack of Transportation (Medical): No    . Lack of Transportation (Non-Medical): No     Current Diagnosis/Assessment:  Goals Addressed   None     Allergic rhinoconjunctivitis/Asthma   Eosinophil count:  No results found for: EOSPCT%                               Eos (Absolute):  Lab Results  Component Value Date/Time   EOSABS 0.1 01/02/2020 09:36 AM    Tobacco Status:  Social History   Tobacco Use  Smoking Status Former Smoker  . Quit date: 2000  . Years since quitting: 21.7  Smokeless Tobacco Never Used    Patient has failed these meds in past: symbicort, cetirizine, fexofenadine, astelin Patient is currently controlled on the following medications:   Albuterol inhaler 2 puffs every 4 hours prn cough or wheeze   Rhinocort allergy 32 mcg/act 1 spray into both nostrils daily prn Using maintenance  inhaler regularly? No Frequency of rescue inhaler use:  infrequently  We discussed:  Patient is rarely using either of her PRN medications. If she notices a cough or going to be in a dusty environment, she uses them as needed. Reports good control.   Plan  Continue current medications   Prediabetes   Recent Relevant Labs: Lab Results  Component Value Date/Time   HGBA1C 5.7 (H) 12/11/2019 09:44 AM     Checking BG: Rarely  Recent FBG Readings: 100-110 mg/dL  Patient has failed these meds in past: n/a Patient is currently controlled on the following medications: diet/lifestyle  We discussed: diet and exercise extensively  Plan  Continue control with diet and exercise and  Hypertension   BP today is:  <130/80  Office blood pressures are  BP Readings from Last 3 Encounters:  03/20/20 (!) 130/74  01/02/20 116/68  12/11/19 102/64    Patient has failed these meds in the past: lisionpril, losartan Patient is currently controlled on the following medications:   Lifestyle  Patient checks BP at home several times per month  Patient home BP readings are ranging: 120/70  We discussed diet and  exercise extensively  Plan  Continue control with diet and exercise   Hyperlipidemia   LDL goal < 100  Lipid Panel     Component Value Date/Time   CHOL 123 02/18/2013 1029   TRIG 82 02/18/2013 1029   HDL 59 02/18/2013 1029   LDLCALC 48 02/18/2013 1029    Hepatic Function Latest Ref Rng & Units 03/20/2020 01/02/2020 12/02/2019  Total Protein 6.0 - 8.5 g/dL 6.9 6.5 6.6  Albumin 3.8 - 4.8 g/dL 4.3 4.3 4.4  AST 0 - 40 IU/L _0 ALT 0 - 32 IU/L 32 16 15  Alk Phosphatase 48 - 121 IU/L 99 85 74  Total Bilirubin 0.0 - 1.2 mg/dL 0.4 0.3 0.3  Bilirubin, Direct 0.00 - 0.40 mg/dL - - 0.10     The ASCVD Risk score ([place]., et al., 2013) failed to calculate for the following reasons:   Cannot find a previous HDL lab   Cannot find a previous total cholesterol lab   Patient has failed these meds in past: n/a  Patient is currently query controlled on the following medications:  . Omega-3 2000 mg daily  We discussed:  diet and exercise extensively. Last lipid panel has been some time ago. Patient reports always in good control due to her lifestyle/diet and fish oil. Discussed updating lipid panel with next lab work.   Plan  Continue control with diet and exercise   Hypothyroidism   Lab Results  Component Value Date/Time   TSH 2.370 03/31/2020 09:34 AM   TSH 3.489 02/18/2013 10:29 AM    Patient has failed these meds in past: n/a Patient is currently controlled on the following medications:  . Levothyroxine 50 mcg daily   We discussed:   Patient feels that her thyroid could be off making her gain weight. Her TSH is well controlled but patient has been looking online at her symptoms and feel that they could be thyroid related. Patient is experiencing thinning and dry hair which she does acknowledge a possibility of coming from methotrexate therapy.   Plan  Continue current medications. Consider full thyroid panel with next lab draw.   GERD   Patient has failed these  meds in past: n/a Patient is currently controlled on the following medications:  . pantoprazole 40 mg daily   We discussed:  Patient denies any symptoms of GERD. States that her hoarseness was coming from a projection issue vs. Acid. Denies a history of ulcer or acid irritation. Patient would like to consider tapering off of pantoprazole with Dr. Alyse Low approval. Patient is concerned that it could be affecting her bone health.   Plan  Consider tapering pantoprazole to every other day to evaluate breakthrough symptoms if Dr. Tobie Poet approves.     Anxiety    Patient has failed these meds in past: n/a Patient is currently controlled on the following medications:  . citalopram 20 mg daily   We discussed:  Patient reports increased sweating and night sweats lately. She is thinking it could be related to citalopram. Originally during our conversation, patient was hoping to discontinue medication. Discussed risk benefit of reducing or removing medication. Patient has some family stressors that cause increase in anxiety (sister and daughter/granddaughter moving out).  Patient also indicates that she is likely facing knee replacement surgery and opted to delay considering reducing dose or weaning off. Patient will let Dr. Tobie Poet or pharmacist know if she chooses to discontinue medication.    Plan  Continue current medications   Osteopenia / Osteoporosis   Last DEXA Scan: 12/01/2017  T-Score femoral neck: -2.0  T-Score lumbar spine: -0.9  10-year probability of major osteoporotic fracture: 18.1%  10-year probability of hip fracture: 3.6%  Vit D, 25-Hydroxy  Date Value Ref Range Status  02/18/2013 60 30 - 89 ng/mL Final    Comment:    This assay accurately quantifies Vitamin D, which is the sum of the 25-Hydroxy forms of Vitamin D2 and D3.  Studies have shown that the optimum concentration of 25-Hydroxy Vitamin D is 30 ng/mL or higher.  Concentrations of Vitamin D between 20 and 29 ng/mL are  considered to be insufficient and concentrations less than 20 ng/mL are considered to be deficient for Vitamin D.     Patient is a candidate for pharmacologic treatment due to T-Score -1.0 to -2.5 and 10-year risk of hip fracture > 3%  Patient has failed these meds in past: n/a Patient is currently controlled on the following medications:  . Calcium carbonate-Vitamin D  1200 mg daily . Vitamin D 1000 units tid  We discussed:  Recommend [phone] units of vitamin D daily. Recommend 1200 mg of calcium daily from dietary and supplemental sources. Recommend weight-bearing and muscle strengthening exercises for building and maintaining bone density.   Patient currently taking 2 calciums at bedtime. Discussed the body's ability to absorb calcium and limitations. Patient drinks almond milk and enjoys dark leafy greens, yogurt and cottage cheese. She is going to change calcium to 1 tablet in the evening and keep a log of calcium containing foods in her diet to determine if second dose is necessary.   Patient's last DEXA scan was in 2019 and could be repeated but patient would like to defer until after decision orthopedic appointment at the end of the month.   Plan  Continue current medications   Psoriatic Arthritis   Patient has failed these meds in past: n/a Patient is currently controlled on the following medications:  . Leucovirin 10 mg 24 hours after methotrexate dose . Methotrexate 2.5 mg 6 tablets weekly on Thursdays . Folic acid 1 mg daily  We discussed:  Patient reports good control of symptoms. Therapy monitored by rheumatology. Patient has discontinued therapy for short-term in the past and notices symptoms of stiffness. Follows up with Dr. Manuella Ghazi this month.   Plan  Continue current medications    Health Maintenance   Patient is currently controlled on the following medications:  . Acetaminophen 325 mg prn - patient not currently taking but has been recommended to take 1000  mg three times daily for pain control  . Macrobid 100 mg 1 capsule post coitally - prn preventative . Valtrex 500 mg daily - prn treatment for flares . Pramipexole 1 mg daily - restless leg syndrome . Multiple vitamin daily - supplementation . Hair/Skin/Nails - supplementation . Magnesium 250 mg daily - supplementation  We discussed:  Patient's pain specialist is completing a trial of oxycodone to determine available pain relief options for post-op. Discussed the benefit of NSAID and Tylenol 1000 mg tid. Patient is on methotrexate and aware of the interaction. She plans to discuss with Dr. Manuella Ghazi and if proceeds to a knee replacement may discontinue methotrexate short-term.   Patient does not feel that her restless leg medication is completely covering symptoms and could also be causing her some problems with vivid dreams, sweating and weight gain. She indicates that her legs and arms cramp up and then have involuntary movements. She would like to consider an alternative if possible.   Lifestyle:    Patient reports decaf coffee/tea. Occasionally has regular tea or pepsi but not daily.   Patient reports that her sleep is interrupted by her knee aching some nights. She gets up to use the restroom once a night.   Patient previously exercised walking 3 miles a day but has had to stop due to her knee pain. She hopes to resume after injection/ablation/surgery  Patient has had good success with Weight Watchers in the past. She got off track on vacation and has gained 8 of her 15 lbs back. She hopes to incorporate more fruits and vegetables in her diet and resume weight watchers.   Plan  Continue current medications  Vaccines   Reviewed and discussed patient's vaccination history.  Patient does not recall receiving the latest Shingrix shot listed on her history. She plans to check with pharmacy to verify.   Immunization History  Administered Date(s) Administered  . DTaP 12/03/2012  . PFIZER  SARS-COV-2 Vaccination 12/12/2019  . Zoster 02/19/2011  . Zoster Recombinat (Shingrix) 05/18/2018    Plan  Recommended patient receive annual flu vaccine in office.   Medication Management   Pt uses [facility] for all medications Uses pill box? Yes Pt endorses excellent compliance  We discussed: Patient takes medications out of her pill box. She denies missed doses. Medications are affordable locally through [facility]. Patient and her husband both still drive and enjoy traveling.   Plan  Continue current medication management strategy    Follow up: 1 month phone visit

## 2020-05-28 ENCOUNTER — Ambulatory Visit: Payer: Self-pay

## 2020-05-28 DIAGNOSIS — I10 Essential (primary) hypertension: Secondary | ICD-10-CM

## 2020-05-28 DIAGNOSIS — E782 Mixed hyperlipidemia: Secondary | ICD-10-CM

## 2020-05-28 NOTE — Patient Instructions (Addendum)
Visit Information  Goals Addressed            This Visit's Progress   . Pharmacy Care Plan       CARE PLAN ENTRY  Current Barriers:  . Chronic Disease Management support, education, and care coordination needs related to Hypertension and Hyperlipidemia   Hypertension BP Readings from Last 3 Encounters:  01/02/20 116/68  12/11/19 102/64  10/28/19 116/72   . Pharmacist Clinical Goal(s): o Over the next 90 days, patient will work with PharmD and providers to maintain BP goal <130/80 . Current regimen:  o Diet and Lifestyle . Interventions: o Continue healthy habits of riding bicycle at the [facility] and remaining active.  o Reviewed medication regimen and lifestyle modifications.  . Patient self care activities - Over the next 90 days, patient will: o Keep up the good work with [employer] and healthy food choices.  o Check BP as needed, document, and provide at future appointments o Ensure daily salt intake < 2300 mg/day  Hyperlipidemia Lab Results  Component Value Date/Time   LDLCALC 48 02/18/2013 10:29 AM   . Pharmacist Clinical Goal(s): o Over the next 90 days, patient will work with PharmD and providers to maintain LDL goal < 70 . Current regimen:  o Omega-3 2000 mg daily . Interventions: o Recommend healthy diet of lean protein, vegetables and fruit.  o Recommend updated lipid panel drawn with next lab work.  . Patient self care activities - Over the next 90 days, patient will: o Continue to take medication as prescribed.  o Contact provider or pharmacist with any questions or concerns.   Prediabetes Lab Results  Component Value Date/Time   HGBA1C 5.7 (H) 12/11/2019 09:44 AM   . Pharmacist Clinical Goal(s): o Over the next 90 days, patient will work with PharmD and providers to maintain A1c goal <6.5% . Current regimen:  o Diet and Lifestyle  . Interventions: o Recommend patient continue to exercise and eat healthy diet.  . Patient self care activities -  Over the next 90 days, patient will: o Contact provider with any episodes of hypoglycemia  Medication management . Pharmacist Clinical Goal(s): o Over the next 90 days, patient will work with PharmD and providers to maintain optimal medication adherence . Current pharmacy: [facility] . Interventions o Comprehensive medication review performed. o Continue current medication management strategy . Patient self care activities - Over the next 90 days, patient will: o Focus on medication adherence by pill box o Take medications as prescribed o Report any questions or concerns to PharmD and/or provider(s)  Initial goal documentation        The patient verbalized understanding of instructions provided today and declined a print copy of patient instruction materials.   Telephone follow up appointment with pharmacy team member scheduled for: 06/2020  Sherre Poot, PharmD, [facility] Clinical Pharmacist [facility] [phone] (office) [phone] (mobile)   Exercises To Do While Sitting  Exercises that you do while sitting (chair exercises) can give you many of the same benefits as full exercise. Benefits include strengthening your heart, burning calories, and keeping muscles and joints healthy. Exercise can also improve your mood and help with depression and anxiety. You may benefit from chair exercises if you are unable to do standing exercises because of:  Diabetic foot pain.  Obesity.  Illness.  Arthritis.  Recovery from surgery or injury.  Breathing problems.  Balance problems.  Another type of disability. Before starting chair exercises, check with your health care  provider or a physical therapist to find out how much exercise you can tolerate and which exercises are safe for you. If your health care provider approves:  Start out slowly and build up over time. Aim to work up to about 10-20 minutes for each exercise session.  Make exercise part of your daily  routine.  Drink water when you exercise. Do not wait until you are thirsty. Drink every 10-15 minutes.  Stop exercising right away if you have pain, nausea, shortness of breath, or dizziness.  If you are exercising in a wheelchair, make sure to lock the wheels.  Ask your health care provider whether you can do tai chi or yoga. Many positions in these mind-body exercises can be modified to do while seated. Warm-up Before starting other exercises: 1. Sit up as straight as you can. Have your knees bent at 90 degrees, which is the shape of the capital letter "L." Keep your feet flat on the floor. 2. Sit at the front edge of your chair, if you can. 3. Pull in (tighten) the muscles in your abdomen and stretch your spine and neck as straight as you can. Hold this position for a few minutes. 4. Breathe in and out evenly. Try to concentrate on your breathing, and relax your mind. Stretching Exercise A: Arm stretch 1. Hold your arms out straight in front of your body. 2. Bend your hands at the wrist with your fingers pointing up, as if signaling someone to stop. Notice the slight tension in your forearms as you hold the position. 3. Keeping your arms out and your hands bent, rotate your hands outward as far as you can and hold this stretch. Aim to have your thumbs pointing up and your pinkie fingers pointing down. Slowly repeat arm stretches for one minute as tolerated. Exercise B: Leg stretch 1. If you can move your legs, try to "draw" letters on the floor with the toes of your foot. Write your name with one foot. 2. Write your name with the toes of your other foot. Slowly repeat the movements for one minute as tolerated. Exercise C: Reach for the sky 1. Reach your hands as far over your head as you can to stretch your spine. 2. Move your hands and arms as if you are climbing a rope. Slowly repeat the movements for one minute as tolerated. Range of motion exercises Exercise A: Shoulder  roll 1. Let your arms hang loosely at your sides. 2. Lift just your shoulders up toward your ears, then let them relax back down. 3. When your shoulders feel loose, rotate your shoulders in backward and forward circles. Do shoulder rolls slowly for one minute as tolerated. Exercise B: March in place 1. As if you are marching, pump your arms and lift your legs up and down. Lift your knees as high as you can. ? If you are unable to lift your knees, just pump your arms and move your ankles and feet up and down. March in place for one minute as tolerated. Exercise C: Seated jumping jacks 1. Let your arms hang down straight. 2. Keeping your arms straight, lift them up over your head. Aim to point your fingers to the ceiling. 3. While you lift your arms, straighten your legs and slide your heels along the floor to your sides, as wide as you can. 4. As you bring your arms back down to your sides, slide your legs back together. ? If you are unable to use your legs,  just move your arms. Slowly repeat seated jumping jacks for one minute as tolerated. Strengthening exercises Exercise A: Shoulder squeeze 1. Hold your arms straight out from your body to your sides, with your elbows bent and your fists pointed at the ceiling. 2. Keeping your arms in the bent position, move them forward so your elbows and forearms meet in front of your face. 3. Open your arms back out as wide as you can with your elbows still bent, until you feel your shoulder blades squeezing together. Hold for 5 seconds. Slowly repeat the movements forward and backward for one minute as tolerated. Contact a health care provider if you:  Had to stop exercising due to any of the following: ? Pain. ? Nausea. ? Shortness of breath. ? Dizziness. ? Fatigue.  Have significant pain or soreness after exercising. Get help right away if you have:  Chest pain.  Difficulty breathing. These symptoms may represent a serious problem that is  an emergency. Do not wait to see if the symptoms will go away. Get medical help right away. Call your local emergency services (911 in the [place] Do not drive yourself to the hospital. This information is not intended to replace advice given to you by your health care provider. Make sure you discuss any questions you have with your health care provider. Document Revised: 11/29/2018 Document Reviewed: 06/21/2017 [employer]vier Patient Education  2020 [employer].

## 2020-05-28 NOTE — Chronic Care Management (AMB) (Signed)
Chronic Care Management Pharmacy  Name: Olivia Cruz  MRN: [id number] DOB: [birth year]  Chief Complaint/ HPI  Olivia Cruz,  69 y.o. , female presents for their Follow-Up CCM visit with the clinical pharmacist via telephone due to COVID-19 Pandemic.  PCP : Rochel Brome, MD  Their chronic conditions include: hypertension, hypothyroidism, neuropathy, psoriatic arthritis, prediabetes, hyperlipidemia, RLS.   Office Visits:  03/20/2020 -  Diaphoresis begin robinul forte trial. Labs checked. CBC and TSH normal.   01/02/2020 - decreased pedal pulses order ankle brachial index assessment.   12/11/2019 - stop losartan and check bp daily. Start allegra and speech therapy exercises.   10/28/2019 - neuropathy improved. Continue management with rheumatology.  Consult Visit:  05/22/2020 - Surgical services - reviewed total knee replacement.   05/20/2020 - Ortho - right total knee replacement scheduled and education given.  04/29/2020 - Rheumatology - stop folic acid. Ok for COVID booster hold MTX for 1 week after.   04/20/2020 - Pain management - proceed with nerve block. Trial a short course oxycodone. If she doesn't tolerate oxycodone, may revisit a trial of dilaudid. Could consider gabapentin 800 mg tid. Supplement for postop pain with tylenol 1000 mg every 8 hours and NSAIDS.   04/14/2020 - pain management - referral for PT for knee pain. Plan to perform nerve blocks.   02/13/2020 - Orthopedic - We discussed treatment options including continued conservative treatment with Tylenol and bracing. We also discussed total knee replacement, but at this time she does not feel she is ready to undergo this. For now she will work on quad and hamstring strengthening and continue to use Tylenol and her brace as needed. She will follow up as needed. Patient is in agreement with this plan and all questions were answered.  01/30/2020 - optometry  12/26/2019 - Orthopedics - ordered trigger  finger injection.  12/19/2019 - Rheumatology - continue methotrexate and leucovorin.  11/08/2019 - Rheumatology -  Continue methotrexate.   11/05/2019 - Dermatology visit - note not available.   10/28/2019 - Gynecology - Pap smear. Valtrex bid x3 days for outbreaks. Macrobid post coitally.   Medications: Outpatient Encounter Medications as of 05/28/2020  Medication Sig  . acetaminophen (TYLENOL) 325 MG tablet Take by mouth every 6 (six) hours as needed.   Marland Kitchen albuterol (PROAIR HFA) 108 (90 Base) MCG/ACT inhaler Inhale two puffs every four to six hours as needed for cough or wheeze.  . budesonide (RHINOCORT ALLERGY) 32 MCG/ACT nasal spray Place 1 spray into both nostrils daily. Reported on 02/04/2016  . Calcium Carbonate-Vitamin D (CALCIUM + D PO) Take 1,200 mg by mouth daily.  . cholecalciferol (VITAMIN D3) 25 MCG (1000 UNIT) tablet Take 1,000 Units by mouth in the morning, at noon, and at bedtime.  . citalopram (CELEXA) 20 MG tablet TAKE 1 TABLET BY MOUTH EVERY DAY AS DIRECTED  . folic acid (FOLVITE) 1 MG tablet Take 1 mg by mouth daily.   Marland Kitchen leucovorin (WELLCOVORIN) 10 MG tablet PLEASE SEE ATTACHED FOR DETAILED DIRECTIONS  . levothyroxine (SYNTHROID) 50 MCG tablet TAKE 1 TABLET BY MOUTH EVERY DAY  . losartan (COZAAR) 25 MG tablet Take 1 tablet (25 mg total) by mouth daily. (Patient not taking: Reported on 04/22/2020)  . Magnesium 250 MG TABS Take 250 mg by mouth daily.  . methotrexate (RHEUMATREX) 2.5 MG tablet TAKE 6 TABLETS (15 MG TOTAL) BY MOUTH ONCE A WEEK FOR 30 DAYS.  Marland Kitchen Multiple Vitamin (MULTIVITAMIN) capsule Take 1 capsule by mouth daily.  Marland Kitchen  Multiple Vitamins-Minerals (HAIR SKIN AND NAILS FORMULA PO) Take by mouth daily.  . nitrofurantoin, macrocrystal-monohydrate, (MACROBID) 100 MG capsule 1 po orally, post coitally  . Omega-3 Fatty Acids (SUPER OMEGA 3 EPA/DHA) 1000 MG CAPS Take 1,000 mg by mouth daily.   Marland Kitchen oxycodone (OXY-IR) 5 MG capsule Take 5 mg by mouth every 8 (eight) hours as  needed.  . pantoprazole (PROTONIX) 40 MG tablet TAKE 1 TABLET BY MOUTH EVERY DAY  . pramipexole (MIRAPEX) 1 MG tablet TAKE 1 TABLET (1 MG) BY ORAL ROUTE DAILY  . valACYclovir (VALTREX) 500 MG tablet daily as needed.    No facility-administered encounter medications on file as of 05/28/2020.   Allergies  Allergen Reactions  . Codeine Anaphylaxis and Nausea Only  . Ambien [Zolpidem Tartrate] Other (See Comments)    Memory loss per patient  . Augmentin [Amoxicillin-Pot Clavulanate] Nausea And Vomiting  . Cyproheptadine Hcl Other (See Comments)    Confusion  . Dilaudid [Hydromorphone]     hallucinations  . Hydrocodone Other (See Comments)    anaphylaxis  . Lisinopril   . Oxycodone     Anaphylaxis  . Prednisone   . Sulfa Antibiotics Other (See Comments)    hives  . Tramadol    SDOH Screenings   Alcohol Screen:   . Last Alcohol Screening Score (AUDIT): Not on file  Depression (PHQ2-9): Low Risk   . PHQ-2 Score: 0  Financial Resource Strain:   . Difficulty of Paying Living Expenses: Not on file  Food Insecurity: No Food Insecurity  . Worried About Charity fundraiser in the Last Year: Never true  . Ran Out of Food in the Last Year: Never true  Housing: Low Risk   . Last Housing Risk Score: 0  Physical Activity:   . Days of Exercise per Week: Not on file  . Minutes of Exercise per Session: Not on file  Social Connections:   . Frequency of Communication with Friends and Family: Not on file  . Frequency of Social Gatherings with Friends and Family: Not on file  . Attends Religious Services: Not on file  . Active Member of Clubs or Organizations: Not on file  . Attends Archivist Meetings: Not on file  . Marital Status: Not on file  Stress:   . Feeling of Stress : Not on file  Tobacco Use: Medium Risk  . Smoking Tobacco Use: Former Smoker  . Smokeless Tobacco Use: Never Used  Transportation Needs: No Transportation Needs  . Lack of Transportation (Medical): No    . Lack of Transportation (Non-Medical): No     Current Diagnosis/Assessment:  Goals Addressed            This Visit's Progress   . Pharmacy Care Plan       CARE PLAN ENTRY  Current Barriers:  . Chronic Disease Management support, education, and care coordination needs related to Hypertension and Hyperlipidemia   Hypertension BP Readings from Last 3 Encounters:  01/02/20 116/68  12/11/19 102/64  10/28/19 116/72   . Pharmacist Clinical Goal(s): o Over the next 90 days, patient will work with PharmD and providers to maintain BP goal <130/80 . Current regimen:  o Diet and Lifestyle . Interventions: o Continue healthy habits of riding bicycle at the [facility] and remaining active.  o Reviewed medication regimen and lifestyle modifications.  . Patient self care activities - Over the next 90 days, patient will: o Keep up the good work with [employer] and healthy food choices.  o Check BP as needed, document, and provide at future appointments o Ensure daily salt intake < 2300 mg/day  Hyperlipidemia Lab Results  Component Value Date/Time   LDLCALC 48 02/18/2013 10:29 AM   . Pharmacist Clinical Goal(s): o Over the next 90 days, patient will work with PharmD and providers to maintain LDL goal < 70 . Current regimen:  o Omega-3 2000 mg daily . Interventions: o Recommend healthy diet of lean protein, vegetables and fruit.  o Recommend updated lipid panel drawn with next lab work.  . Patient self care activities - Over the next 90 days, patient will: o Continue to take medication as prescribed.  o Contact provider or pharmacist with any questions or concerns.   Prediabetes Lab Results  Component Value Date/Time   HGBA1C 5.7 (H) 12/11/2019 09:44 AM   . Pharmacist Clinical Goal(s): o Over the next 90 days, patient will work with PharmD and providers to maintain A1c goal <6.5% . Current regimen:  o Diet and Lifestyle  . Interventions: o Recommend patient continue to  exercise and eat healthy diet.  . Patient self care activities - Over the next 90 days, patient will: o Contact provider with any episodes of hypoglycemia  Medication management . Pharmacist Clinical Goal(s): o Over the next 90 days, patient will work with PharmD and providers to maintain optimal medication adherence . [facility] . Interventions o Comprehensive medication review performed. o Continue current medication management strategy . Patient self care activities - Over the next 90 days, patient will: o Focus on medication adherence by pill box o Take medications as prescribed o Report any questions or concerns to PharmD and/or provider(s)  Initial goal documentation         Prediabetes   Recent Relevant Labs: Lab Results  Component Value Date/Time   HGBA1C 5.7 (H) 12/11/2019 09:44 AM     Checking BG: Rarely  Recent FBG Readings: 100-110 mg/dL  Patient has failed these meds in past: n/a Patient is currently controlled on the following medications: diet/lifestyle  We discussed: diet and exercise extensively   Update 05/28/2020 - Patient is working on healthier eating, weight loss and exercise.  Plan  Continue control with diet and exercise and  Hypertension   BP today is:  <130/80  Office blood pressures are  BP Readings from Last 3 Encounters:  03/20/20 (!) 130/74  01/02/20 116/68  12/11/19 102/64    Patient has failed these meds in the past: lisionpril, losartan Patient is currently controlled on the following medications:   Lifestyle  Patient checks BP at home several times per month  Patient home BP readings are ranging: 120/70  We discussed diet and exercise extensively   Update 05/28/2020 - Patient is working on healthier eating, weight loss and exercise.   Plan  Continue control with diet and exercise   Hyperlipidemia   LDL goal < 100  Lipid Panel     Component Value Date/Time   CHOL 123 02/18/2013 1029   TRIG 82  02/18/2013 1029   HDL 59 02/18/2013 1029   LDLCALC 48 02/18/2013 1029    Hepatic Function Latest Ref Rng & Units 03/20/2020 01/02/2020 12/02/2019  Total Protein 6.0 - 8.5 g/dL 6.9 6.5 6.6  Albumin 3.8 - 4.8 g/dL 4.3 4.3 4.4  AST 0 - 40 IU/L '27 20 22  ' ALT 0 - 32 IU/L 32 16 15  Alk Phosphatase 48 - 121 IU/L 99 85 74  Total Bilirubin 0.0 - 1.2 mg/dL 0.4 0.3 0.3  Bilirubin, Direct 0.00 - 0.40 mg/dL - - 0.10     The ASCVD Risk score Mikey Bussing DC Jr., et al., 2013) failed to calculate for the following reasons:   Cannot find a previous HDL lab   Cannot find a previous total cholesterol lab   Patient has failed these meds in past: n/a  Patient is currently query controlled on the following medications:  . Omega-3 2000 mg daily  We discussed:  diet and exercise extensively. Last lipid panel has been some time ago. Patient reports always in good control due to her lifestyle/diet and fish oil. Discussed updating lipid panel with next lab work.   Update 05/28/2020 - Patient is working on healthier eating, weight loss and exercise. Has labs schedule 05/2020.   Plan  Continue control with diet and exercise   Hypothyroidism   Lab Results  Component Value Date/Time   TSH 2.370 03/31/2020 09:34 AM   TSH 3.489 02/18/2013 10:29 AM    Patient has failed these meds in past: n/a Patient is currently controlled on the following medications:  . Levothyroxine 50 mcg daily   We discussed:   Patient feels that her thyroid could be off making her gain weight. Her TSH is well controlled but patient has been looking online at her symptoms and feel that they could be thyroid related. Patient is experiencing thinning and dry hair which she does acknowledge a possibility of coming from methotrexate therapy.   Update 05/28/2020 - Patient would like full thyroid panel with October labs. She is experiencing lower energy, hair loss and requires a nap daily. She is struggling with weight loss even though she has  resumed weight watchers. She is exercising as much as tolerated with her knee pain. She complete PT exercises and walked a mile this week but knee limits this.   Plan  Continue current medications. Consider full thyroid panel with next lab draw.   GERD   Patient has failed these meds in past: n/a Patient is currently controlled on the following medications:  . pantoprazole 40 mg daily   We discussed:  Patient denies any symptoms of GERD. States that her hoarseness was coming from a projection issue vs. Acid. Denies a history of ulcer or acid irritation. Patient would like to consider tapering off of pantoprazole with Dr. Alyse Low approval. Patient is concerned that it could be affecting her bone health.   Update 05/28/2020 - Patient is going to try pantoprazole every other day. She will monitor for symptoms and increase back to daily if needed. She is working on more water and weight loss currently.   Plan  Recommend pantoprazole every other day. Patient will contact pharmacist with any symptom breakthrough. Pharmacist follow-up in 1 week.   Anxiety    Patient has failed these meds in past: n/a Patient is currently controlled on the following medications:  . citalopram 20 mg daily   We discussed:  Patient reports increased sweating and night sweats lately. She is thinking it could be related to citalopram. Originally during our conversation, patient was hoping to discontinue medication. Discussed risk benefit of reducing or removing medication. Patient has some family stressors that cause increase in anxiety (sister and daughter/granddaughter moving out).  Patient also indicates that she is likely facing knee replacement surgery and opted to delay considering reducing dose or weaning off. Patient will let Dr. Tobie Poet or pharmacist know if she chooses to discontinue medication.   Update 05/28/2020 - Patient's daughter has moved back to [place]. She and  her husband are still trying to adjust to  this. She reports that her knee replacement is scheduled for 11/30.    Plan  Continue current medications   Osteopenia / Osteoporosis   Last DEXA Scan: 12/01/2017  T-Score femoral neck: -2.0  T-Score lumbar spine: -0.9  10-year probability of major osteoporotic fracture: 18.1%  10-year probability of hip fracture: 3.6%  Vit D, 25-Hydroxy  Date Value Ref Range Status  02/18/2013 60 30 - 89 ng/mL Final    Comment:    This assay accurately quantifies Vitamin D, which is the sum of the 25-Hydroxy forms of Vitamin D2 and D3.  Studies have shown that the optimum concentration of 25-Hydroxy Vitamin D is 30 ng/mL or higher.  Concentrations of Vitamin D between 20 and 29 ng/mL are considered to be insufficient and concentrations less than 20 ng/mL are considered to be deficient for Vitamin D.     Patient is a candidate for pharmacologic treatment due to T-Score -1.0 to -2.5 and 10-year risk of hip fracture > 3%  Patient has failed these meds in past: n/a Patient is currently controlled on the following medications:  . Calcium carbonate-Vitamin D  1200 mg daily . Vitamin D 1000 units tid  We discussed:  Recommend [phone] units of vitamin D daily. Recommend 1200 mg of calcium daily from dietary and supplemental sources. Recommend weight-bearing and muscle strengthening exercises for building and maintaining bone density.   Patient currently taking 2 calciums at bedtime. Discussed the body's ability to absorb calcium and limitations. Patient drinks almond milk and enjoys dark leafy greens, yogurt and cottage cheese. She is going to change calcium to 1 tablet in the evening and keep a log of calcium containing foods in her diet to determine if second dose is necessary.   Patient's last DEXA scan was in 2019 and could be repeated but patient would like to defer until after decision orthopedic appointment at the end of the month.   Plan  Continue current medications   Psoriatic  Arthritis   Patient has failed these meds in past: n/a Patient is currently controlled on the following medications:  . Leucovirin 10 mg 24 hours after methotrexate dose . Methotrexate 2.5 mg 6 tablets weekly on Thursdays . Folic acid 1 mg daily  We discussed:  Patient reports good control of symptoms. Therapy monitored by rheumatology. Patient has discontinued therapy for short-term in the past and notices symptoms of stiffness. Follows up with Dr. Manuella Ghazi this month.   Update 05/28/2020 - Reports well controlled symptoms at this time.   Plan  Continue current medications   Health Maintenance   Patient is currently controlled on the following medications:  . Acetaminophen 325 mg prn - patient not currently taking but has been recommended to take 1000 mg three times daily for pain control  . Macrobid 100 mg 1 capsule post coitally - prn preventative . Valtrex 500 mg daily - prn treatment for flares . Pramipexole 1 mg daily - restless leg syndrome . Multiple vitamin daily - supplementation . Hair/Skin/Nails - supplementation . Magnesium 250 mg daily - supplementation  We discussed:  Patient's pain specialist is completing a trial of oxycodone to determine available pain relief options for post-op. Discussed the benefit of NSAID and Tylenol 1000 mg tid. Patient is on methotrexate and aware of the interaction. She plans to discuss with Dr. Manuella Ghazi and if proceeds to a knee replacement may discontinue methotrexate short-term.   Patient does not feel that her restless leg  medication is completely covering symptoms and could also be causing her some problems with vivid dreams, sweating and weight gain. She indicates that her legs and arms cramp up and then have involuntary movements. She would like to consider an alternative if possible.   Lifestyle:    Patient reports decaf coffee/tea. Occasionally has regular tea or pepsi but not daily.   Patient reports that her sleep is interrupted by her  knee aching some nights. She gets up to use the restroom once a night.   Patient previously exercised walking 3 miles a day but has had to stop due to her knee pain. She hopes to resume after injection/ablation/surgery  Patient has had good success with Weight Watchers in the past. She got off track on vacation and has gained 8 of her 15 lbs back. She hopes to incorporate more fruits and vegetables in her diet and resume weight watchers.   Update 05/28/2020 -  Patient has been drinking more water and avoiding caffeine. She reports that her symptoms are better controlled with 1 mg of pramipexole. She has only had 1 episode in the past 2 weeks. She will let Dr. Tobie Poet know if she needs to increase but thinks the lifestyle modifications are working for now.   Plan  Continue current medications  Vaccines   Reviewed and discussed patient's vaccination history.  Patient does not recall receiving the latest Shingrix shot listed on her history. She plans to check with pharmacy to verify.   Immunization History  Administered Date(s) Administered  . DTaP 12/03/2012  . PFIZER SARS-COV-2 Vaccination 12/12/2019  . Zoster 02/19/2011  . Zoster Recombinat (Shingrix) 05/18/2018   Update 05/28/2020 -  Patient is interested in 3rd COVID vaccine (had vaccine 11/2019 and 12/2019). She plans to get annual flu vaccine in 2 weeks at visit. Pharmacist coordinated with Maudie Mercury to add patient to scheduling list.   Plan  Recommended patient receive annual flu vaccine in office.   Medication Management   [facility] Uses pill box? Yes Pt endorses excellent compliance  We discussed: Patient takes medications out of her pill box. She denies missed doses. [facility]ations [facility]. Patient and her husband both still drive and enjoy traveling.   Plan  Continue current medication management strategy    Follow up: 1 month phone visit

## 2020-06-09 ENCOUNTER — Other Ambulatory Visit: Payer: Medicare HMO

## 2020-06-09 ENCOUNTER — Other Ambulatory Visit: Payer: Self-pay

## 2020-06-09 DIAGNOSIS — R0989 Other specified symptoms and signs involving the circulatory and respiratory systems: Secondary | ICD-10-CM

## 2020-06-09 DIAGNOSIS — L301 Dyshidrosis [pompholyx]: Secondary | ICD-10-CM | POA: Diagnosis not present

## 2020-06-09 DIAGNOSIS — L4050 Arthropathic psoriasis, unspecified: Secondary | ICD-10-CM

## 2020-06-09 DIAGNOSIS — D485 Neoplasm of uncertain behavior of skin: Secondary | ICD-10-CM | POA: Diagnosis not present

## 2020-06-09 LAB — COMP. METABOLIC PANEL (12)
AST: 28 IU/L (ref 0–40)
Albumin/Globulin Ratio: 1.9 (ref 1.2–2.2)
Albumin: 4.3 g/dL (ref 3.8–4.8)
Alkaline Phosphatase: 102 IU/L (ref 44–121)
BUN/Creatinine Ratio: 17 (ref 12–28)
BUN: 11 mg/dL (ref 8–27)
Bilirubin Total: 0.3 mg/dL (ref 0.0–1.2)
Calcium: 9.8 mg/dL (ref 8.7–10.3)
Chloride: 102 mmol/L (ref 96–106)
Creatinine, Ser: 0.65 mg/dL (ref 0.57–1.00)
GFR calc Af Amer: 104 mL/min/{1.73_m2} (ref >59)
GFR calc non Af Amer: 90 mL/min/{1.73_m2} (ref >59)
Globulin, Total: 2.3 g/dL (ref 1.5–4.5)
Glucose: 95 mg/dL (ref 65–99)
Potassium: 4.4 mmol/L (ref 3.5–5.2)
Sodium: 139 mmol/L (ref 134–144)
Total Protein: 6.6 g/dL (ref 6.0–8.5)

## 2020-06-11 ENCOUNTER — Other Ambulatory Visit: Payer: Self-pay

## 2020-06-11 ENCOUNTER — Ambulatory Visit (INDEPENDENT_AMBULATORY_CARE_PROVIDER_SITE_OTHER): Payer: Medicare HMO | Admitting: Family Medicine

## 2020-06-11 ENCOUNTER — Encounter: Payer: Self-pay | Admitting: Family Medicine

## 2020-06-11 VITALS — BP 124/76 | HR 72 | Temp 97.1°F | Ht 62.0 in | Wt 153.6 lb

## 2020-06-11 DIAGNOSIS — I10 Essential (primary) hypertension: Secondary | ICD-10-CM

## 2020-06-11 DIAGNOSIS — Z23 Encounter for immunization: Secondary | ICD-10-CM

## 2020-06-11 DIAGNOSIS — F330 Major depressive disorder, recurrent, mild: Secondary | ICD-10-CM

## 2020-06-11 DIAGNOSIS — K219 Gastro-esophageal reflux disease without esophagitis: Secondary | ICD-10-CM | POA: Diagnosis not present

## 2020-06-11 DIAGNOSIS — E782 Mixed hyperlipidemia: Secondary | ICD-10-CM | POA: Diagnosis not present

## 2020-06-11 DIAGNOSIS — E038 Other specified hypothyroidism: Secondary | ICD-10-CM

## 2020-06-11 DIAGNOSIS — I700 Atherosclerosis of aorta: Secondary | ICD-10-CM | POA: Diagnosis not present

## 2020-06-11 DIAGNOSIS — L4050 Arthropathic psoriasis, unspecified: Secondary | ICD-10-CM

## 2020-06-11 DIAGNOSIS — R7303 Prediabetes: Secondary | ICD-10-CM

## 2020-06-11 DIAGNOSIS — G2581 Restless legs syndrome: Secondary | ICD-10-CM

## 2020-06-11 DIAGNOSIS — R69 Illness, unspecified: Secondary | ICD-10-CM | POA: Diagnosis not present

## 2020-06-11 NOTE — Progress Notes (Signed)
Subjective:  Patient ID: Olivia Cruz, female    DOB: [birth year]  Age: 69 y.o. MRN: [id number]  Chief Complaint  Patient presents with  . Hypothyroidism  . Hyperlipidemia    HPI Olivia Cruz presents for follow-up of her chronic medical issues which include hyperlipidemia, hypothyroidism, hypertension, psoriatic arthritis, and prediabetes.  Patient was diagnosed with psoriatic arthritis within the last 6 months.  She sees rheumatology.  She is currently taking methotrexate 2.5 mg 6 tablets once a week and folic acid.  This has helped her joint pain.  Hypothyroidism: Currently taking levothyroxine 50 mcg once daily.  Hypertension: Currently on losartan 25 mg once daily.  Blood pressure 120s over 80s.  Hyperlipidemia: Patient is currently taking fish oil OTC 1000 mg once daily.  She is currently not on any statins.  She is trying to eat healthy.  She has been cutting her sugars.  No caffeine.  Working on eating "cleaner" she has been unable to exercise due to her knee pain.  She does try to do some walking.  She is doing home physical therapy exercises to prepare for a total knee replacement at the end of November.  GERD: Currently on pantoprazole.  She has hoarseness intermittently and has been found to have LPR.  Pantoprazole has helped.  Recurrent mild major depression.  Controlled on citalopram 20 mg once daily.  Current Outpatient Medications on File Prior to Visit  Medication Sig Dispense Refill  . acetaminophen (TYLENOL) 325 MG tablet Take by mouth every 6 (six) hours as needed.     Marland Kitchen albuterol (PROAIR HFA) 108 (90 Base) MCG/ACT inhaler Inhale two puffs every four to six hours as needed for cough or wheeze. 1 Inhaler 1  . budesonide (RHINOCORT ALLERGY) 32 MCG/ACT nasal spray Place 1 spray into both nostrils daily. Reported on 02/04/2016    . Calcium Carbonate-Vitamin D (CALCIUM + D PO) Take 1,200 mg by mouth daily.    . cholecalciferol (VITAMIN D3) 25 MCG (1000 UNIT) tablet Take  1,000 Units by mouth in the morning, at noon, and at bedtime.    . citalopram (CELEXA) 20 MG tablet TAKE 1 TABLET BY MOUTH EVERY DAY AS DIRECTED 90 tablet 0  . folic acid (FOLVITE) 1 MG tablet Take 1 mg by mouth daily.     Marland Kitchen leucovorin (WELLCOVORIN) 10 MG tablet PLEASE SEE ATTACHED FOR DETAILED DIRECTIONS    . levothyroxine (SYNTHROID) 50 MCG tablet TAKE 1 TABLET BY MOUTH EVERY DAY 90 tablet 2  . losartan (COZAAR) 25 MG tablet Take 1 tablet (25 mg total) by mouth daily. (Patient not taking: Reported on 04/22/2020) 90 tablet 0  . Magnesium 250 MG TABS Take 250 mg by mouth daily.    . methotrexate (RHEUMATREX) 2.5 MG tablet TAKE 6 TABLETS (15 MG TOTAL) BY MOUTH ONCE A WEEK FOR 30 DAYS.    Marland Kitchen Multiple Vitamin (MULTIVITAMIN) capsule Take 1 capsule by mouth daily.    . Multiple Vitamins-Minerals (HAIR SKIN AND NAILS FORMULA PO) Take by mouth daily.    . nitrofurantoin, macrocrystal-monohydrate, (MACROBID) 100 MG capsule 1 po orally, post coitally 30 capsule 2  . Omega-3 Fatty Acids (SUPER OMEGA 3 EPA/DHA) 1000 MG CAPS Take 1,000 mg by mouth daily.     . pantoprazole (PROTONIX) 40 MG tablet TAKE 1 TABLET BY MOUTH EVERY DAY 90 tablet 1  . pramipexole (MIRAPEX) 1 MG tablet TAKE 1 TABLET (1 MG) BY ORAL ROUTE DAILY 90 tablet 1  . valACYclovir (VALTREX) 500 MG tablet daily as  needed.      No current facility-administered medications on file prior to visit.   Past Medical History:  Diagnosis Date  . Allergic rhinoconjunctivitis   . Anxiety   . Asthma   . Depression   . Diverticulitis   . GERD (gastroesophageal reflux disease)   . Hypothyroid   . Insomnia   . Laryngopharyngeal reflux (LPR)   . Migraines   . Osteoarthritis   . Psoriatic arthritis ([employer])   . Skin cancer   . STD (sexually transmitted disease)    HSV II   Past Surgical History:  Procedure Laterality Date  . BELPHAROPTOSIS REPAIR    . BREAST SURGERY Left    times 2  . COLONOSCOPY  06/03/2013   Moderate predominantly sigmoid  diverticulosis. Small interal hemorroids  . FOOT SURGERY Right    Removed bone spur  . GANGLION CYST EXCISION Left    foot  . HEMORRHOID SURGERY    . SKIN CANCER EXCISION    . UPPER GI ENDOSCOPY  03/23/2016   Mild gastritis, gastric polyps bxbenign squamous mucosa with no abnormaility, fundic glad poly in setting of mild chron gastritis.    Family History  Problem Relation Age of Onset  . Breast cancer Sister        lung  . Cancer Maternal Grandmother   . Multiple births Maternal Grandmother   . Multiple births Paternal Grandmother   . Cancer Paternal Grandmother   . Thyroid disease Mother   . Heart failure Mother   . Parkinson's disease Father    Social History   Socioeconomic History  . Marital status: Married    Spouse name: Not on file  . Number of children: Not on file  . Years of education: Not on file  . Highest education level: Not on file  Occupational History  . Not on file  Tobacco Use  . Smoking status: Former Smoker    Quit date: 2000    Years since quitting: 21.8  . Smokeless tobacco: Never Used  Vaping Use  . Vaping Use: Never used  Substance and Sexual Activity  . Alcohol use: No    Alcohol/week: 0.0 standard drinks  . Drug use: No  . Sexual activity: Yes    Partners: Male    Comment: husband vasectomy  Other Topics Concern  . Not on file  Social History Narrative  . Not on file   Social Determinants of Health   Financial Resource Strain:   . Difficulty of Paying Living Expenses: Not on file  Food Insecurity: No Food Insecurity  . Worried About Charity fundraiser in the Last Year: Never true  . Ran Out of Food in the Last Year: Never true  Transportation Needs: No Transportation Needs  . Lack of Transportation (Medical): No  . Lack of Transportation (Non-Medical): No  Physical Activity:   . Days of Exercise per Week: Not on file  . Minutes of Exercise per Session: Not on file  Stress:   . Feeling of Stress : Not on file  Social  Connections:   . Frequency of Communication with Friends and Family: Not on file  . Frequency of Social Gatherings with Friends and Family: Not on file  . Attends Religious Services: Not on file  . Active Member of Clubs or Organizations: Not on file  . Attends Archivist Meetings: Not on file  . Marital Status: Not on file    Review of Systems  Constitutional: Positive for fatigue and unexpected  weight change (Help a little bit). Negative for chills and fever.  HENT: Negative for congestion, rhinorrhea and sore throat.   Respiratory: Positive for cough. Negative for shortness of breath.   Cardiovascular: Negative for chest pain.  Gastrointestinal: Negative for abdominal pain, constipation, diarrhea, nausea and vomiting.  Endocrine: Negative for polydipsia and polyphagia.  Genitourinary: Negative for dysuria and urgency.       Stress incontinence  Musculoskeletal: Positive for arthralgias (right knee pain, scheduled for total knee replacement in late November). Negative for back pain and myalgias.  Skin: Positive for rash (left foot, seen by dermatology and given medication ).  Neurological: Negative for dizziness, weakness, light-headedness and headaches.  Psychiatric/Behavioral: Negative for dysphoric mood. The patient is not nervous/anxious.       Objective:  BP 124/76   Pulse 72   Temp (!) 97.1 F (36.2 C)   Ht 5\' 2"  (1.575 m)   Wt 153 lb 9.6 oz (69.7 kg)   LMP 08/22/2005   BMI 28.09 kg/m   BP/Weight 06/11/2020 03/20/2020 5/63/8937  Systolic BP 342 876 811  Diastolic BP 76 74 68  Wt. (Lbs) 153.6 147 143  BMI 28.09 27.33 26.58    Physical Exam Vitals reviewed.  Constitutional:      Appearance: Normal appearance. She is normal weight.  Cardiovascular:     Rate and Rhythm: Normal rate and regular rhythm.     Pulses: Normal pulses.     Heart sounds: Normal heart sounds.  Pulmonary:     Effort: Pulmonary effort is normal. No respiratory distress.      Breath sounds: Normal breath sounds.  Abdominal:     General: Abdomen is flat. Bowel sounds are normal.     Palpations: Abdomen is soft.     Tenderness: There is no abdominal tenderness.  Neurological:     Mental Status: She is alert and oriented to person, place, and time.  Psychiatric:        Mood and Affect: Mood normal.        Behavior: Behavior normal.     Diabetic Foot Exam - Simple   No data filed       Lab Results  Component Value Date   WBC 5.5 03/31/2020   HGB 14.0 03/31/2020   HCT 42.8 03/31/2020   PLT 282 03/31/2020   GLUCOSE 95 06/09/2020   CHOL 199 06/11/2020   TRIG 166 (H) 06/11/2020   HDL 54 06/11/2020   LDLCALC 116 (H) 06/11/2020   ALT 32 03/20/2020   AST 28 06/09/2020   NA 139 06/09/2020   K 4.4 06/09/2020   CL 102 06/09/2020   CREATININE 0.65 06/09/2020   BUN 11 06/09/2020   CO2 23 03/20/2020   TSH 2.370 03/31/2020   HGBA1C 5.7 (H) 12/11/2019      Assessment & Plan:    1. Essential hypertension Well controlled.  No changes to medicines.  Continue to work on eating a healthy diet and exercise.   2. Prediabetes Recommend continue to work on eating healthy diet and exercise. Last A1C 5.7 in 11/2019  3. Secondary hypothyroidism The current medical regimen is effective;  continue present plan and medications.  4. Psoriatic arthritis ([place]) The current medical regimen is effective;  continue present plan and medications. Management per specialist.   5. Mixed hyperlipidemia Not quite at goal  No changes to medicines.  Continue to work on eating a healthy diet and exercise.  Labs drawn today.  - Lipid panel - Cardiovascular Risk Assessment  6. Atherosclerosis of aorta ([place]) Recommend continue to work on eating healthy diet and exercise. - Cardiovascular Risk Assessment  7. Mild recurrent major depression ([facility]) The current medical regimen is effective;  continue present plan and medications.  8. Need for immunization against  influenza - Flu Vaccine QUAD High Dose(Fluad)  9. RLS (restless legs syndrome) The current medical regimen is effective;  continue present plan and medications.  10. Gastroesophageal reflux disease without esophagitis  The current medical regimen is effective;  continue present plan and medications.  No orders of the defined types were placed in this encounter.   Orders Placed This Encounter  Procedures  . Flu Vaccine QUAD High Dose(Fluad)  . Lipid panel  . Cardiovascular Risk Assessment     Follow-up: Return in about 3 months (around 09/11/2020) for fasting.  An After Visit Summary was printed and given to the patient.  Rochel Brome [facility] [phone]

## 2020-06-12 LAB — CARDIOVASCULAR RISK ASSESSMENT

## 2020-06-12 LAB — LIPID PANEL
Chol/HDL Ratio: 3.7 ratio (ref 0.0–4.4)
Cholesterol, Total: 199 mg/dL (ref 100–199)
HDL: 54 mg/dL (ref >39)
LDL Chol Calc (NIH): 116 mg/dL — ABNORMAL HIGH (ref 0–99)
Triglycerides: 166 mg/dL — ABNORMAL HIGH (ref 0–149)
VLDL Cholesterol Cal: 29 mg/dL (ref 5–40)

## 2020-06-18 DIAGNOSIS — C44529 Squamous cell carcinoma of skin of other part of trunk: Secondary | ICD-10-CM | POA: Diagnosis not present

## 2020-06-19 ENCOUNTER — Telehealth: Payer: Self-pay

## 2020-06-19 NOTE — Chronic Care Management (AMB) (Signed)
    Chronic Care Management Pharmacy Assistant    Name: SHAI RASMUSSEN  MRN: [id number] DOB: Jul 18, 1951    Reason for Encounter: Medication Review- Medication Adherence.    PCP : Rochel Brome, MD  Allergies:   Allergies  Allergen Reactions  . Codeine Anaphylaxis and Nausea Only  . Ambien [Zolpidem Tartrate] Other (See Comments)    Memory loss per patient  . Augmentin [Amoxicillin-Pot Clavulanate] Nausea And Vomiting  . Cyproheptadine Hcl Other (See Comments)    Confusion  . Dilaudid [Hydromorphone]     hallucinations  . Hydrocodone Other (See Comments)    anaphylaxis  . Lisinopril   . Oxycodone     Anaphylaxis  . Prednisone   . Sulfa Antibiotics Other (See Comments)    hives  . Tramadol     Medications: Outpatient Encounter Medications as of 06/19/2020  Medication Sig  . acetaminophen (TYLENOL) 325 MG tablet Take by mouth every 6 (six) hours as needed.   Marland Kitchen albuterol (PROAIR HFA) 108 (90 Base) MCG/ACT inhaler Inhale two puffs every four to six hours as needed for cough or wheeze.  . budesonide (RHINOCORT ALLERGY) 32 MCG/ACT nasal spray Place 1 spray into both nostrils daily. Reported on 02/04/2016  . Calcium Carbonate-Vitamin D (CALCIUM + D PO) Take 1,200 mg by mouth daily.  . cholecalciferol (VITAMIN D3) 25 MCG (1000 UNIT) tablet Take 1,000 Units by mouth in the morning, at noon, and at bedtime.  . citalopram (CELEXA) 20 MG tablet TAKE 1 TABLET BY MOUTH EVERY DAY AS DIRECTED  . folic acid (FOLVITE) 1 MG tablet Take 1 mg by mouth daily.   Marland Kitchen leucovorin (WELLCOVORIN) 10 MG tablet PLEASE SEE ATTACHED FOR DETAILED DIRECTIONS  . levothyroxine (SYNTHROID) 50 MCG tablet TAKE 1 TABLET BY MOUTH EVERY DAY  . losartan (COZAAR) 25 MG tablet Take 1 tablet (25 mg total) by mouth daily. (Patient not taking: Reported on 04/22/2020)  . Magnesium 250 MG TABS Take 250 mg by mouth daily.  . methotrexate (RHEUMATREX) 2.5 MG tablet TAKE 6 TABLETS (15 MG TOTAL) BY MOUTH ONCE A WEEK FOR 30  DAYS.  Marland Kitchen Multiple Vitamin (MULTIVITAMIN) capsule Take 1 capsule by mouth daily.  . Multiple Vitamins-Minerals (HAIR SKIN AND NAILS FORMULA PO) Take by mouth daily.  . nitrofurantoin, macrocrystal-monohydrate, (MACROBID) 100 MG capsule 1 po orally, post coitally  . Omega-3 Fatty Acids (SUPER OMEGA 3 EPA/DHA) 1000 MG CAPS Take 1,000 mg by mouth daily.   . pantoprazole (PROTONIX) 40 MG tablet TAKE 1 TABLET BY MOUTH EVERY DAY  . pramipexole (MIRAPEX) 1 MG tablet TAKE 1 TABLET (1 MG) BY ORAL ROUTE DAILY  . valACYclovir (VALTREX) 500 MG tablet daily as needed.    No facility-administered encounter medications on file as of 06/19/2020.    Current Diagnosis: Patient Active Problem List   Diagnosis Date Noted  . Secondary hypothyroidism 12/22/2019  . Essential hypertension 12/22/2019  . Prediabetes 12/22/2019  . Mixed hyperlipidemia 12/22/2019  . RLS (restless legs syndrome) 12/22/2019  . Psoriatic arthritis ([place]) 10/28/2019  . Idiopathic progressive neuropathy 10/28/2019  . Acute cystitis without hematuria 09/29/2019  . Shortness of breath 07/12/2017  . Chest tightness 09/29/2015  . Cervical os stenosis 04/09/2015  . Postmenopausal atrophic vaginitis 04/09/2015      Follow-Up:  Pharmacist Review - Reviewed chart and adherence measures. Per Insurance data, Losartan adherence is 52.9% PDC YTD. - 80 missed days YTD.  Elly Modena Brown,CPP. Notified   Judithann Sheen, [place] Pharmacist Assistant [phone]

## 2020-06-21 ENCOUNTER — Encounter: Payer: Self-pay | Admitting: Family Medicine

## 2020-06-22 NOTE — Chronic Care Management (AMB) (Deleted)
Chronic Care Management Pharmacy  Name: Olivia Cruz  MRN: [id number] DOB: March 20, 1951  Chief Complaint/ HPI  Olivia Cruz,  69 y.o. , female presents for their Follow-Up CCM visit with the clinical pharmacist via telephone due to COVID-19 Pandemic.  PCP : Rochel Brome, MD  Their chronic conditions include: hypertension, hypothyroidism, neuropathy, psoriatic arthritis, prediabetes, hyperlipidemia, RLS.   Office Visits:  06/11/2020 - flu shot given in office.  Consult Visit:    Medications: Outpatient Encounter Medications as of 06/24/2020  Medication Sig  . acetaminophen (TYLENOL) 325 MG tablet Take by mouth every 6 (six) hours as needed.   Marland Kitchen albuterol (PROAIR HFA) 108 (90 Base) MCG/ACT inhaler Inhale two puffs every four to six hours as needed for cough or wheeze.  . budesonide (RHINOCORT ALLERGY) 32 MCG/ACT nasal spray Place 1 spray into both nostrils daily. Reported on 02/04/2016  . Calcium Carbonate-Vitamin D (CALCIUM + D PO) Take 1,200 mg by mouth daily.  . cholecalciferol (VITAMIN D3) 25 MCG (1000 UNIT) tablet Take 1,000 Units by mouth in the morning, at noon, and at bedtime.  . citalopram (CELEXA) 20 MG tablet TAKE 1 TABLET BY MOUTH EVERY DAY AS DIRECTED  . folic acid (FOLVITE) 1 MG tablet Take 1 mg by mouth daily.   Marland Kitchen leucovorin (WELLCOVORIN) 10 MG tablet PLEASE SEE ATTACHED FOR DETAILED DIRECTIONS  . levothyroxine (SYNTHROID) 50 MCG tablet TAKE 1 TABLET BY MOUTH EVERY DAY  . losartan (COZAAR) 25 MG tablet Take 1 tablet (25 mg total) by mouth daily. (Patient not taking: Reported on 04/22/2020)  . Magnesium 250 MG TABS Take 250 mg by mouth daily.  . methotrexate (RHEUMATREX) 2.5 MG tablet TAKE 6 TABLETS (15 MG TOTAL) BY MOUTH ONCE A WEEK FOR 30 DAYS.  Marland Kitchen Multiple Vitamin (MULTIVITAMIN) capsule Take 1 capsule by mouth daily.  . Multiple Vitamins-Minerals (HAIR SKIN AND NAILS FORMULA PO) Take by mouth daily.  . nitrofurantoin, macrocrystal-monohydrate, (MACROBID) 100  MG capsule 1 po orally, post coitally  . Omega-3 Fatty Acids (SUPER OMEGA 3 EPA/DHA) 1000 MG CAPS Take 1,000 mg by mouth daily.   . pantoprazole (PROTONIX) 40 MG tablet TAKE 1 TABLET BY MOUTH EVERY DAY  . pramipexole (MIRAPEX) 1 MG tablet TAKE 1 TABLET (1 MG) BY ORAL ROUTE DAILY  . valACYclovir (VALTREX) 500 MG tablet daily as needed.    No facility-administered encounter medications on file as of 06/24/2020.   Allergies  Allergen Reactions  . Codeine Anaphylaxis and Nausea Only  . Ambien [Zolpidem Tartrate] Other (See Comments)    Memory loss per patient  . Augmentin [Amoxicillin-Pot Clavulanate] Nausea And Vomiting  . Cyproheptadine Hcl Other (See Comments)    Confusion  . Dilaudid [Hydromorphone]     hallucinations  . Hydrocodone Other (See Comments)    anaphylaxis  . Lisinopril   . Oxycodone     Anaphylaxis  . Prednisone   . Sulfa Antibiotics Other (See Comments)    hives  . Tramadol    SDOH Screenings   Alcohol Screen:   . Last Alcohol Screening Score (AUDIT): Not on file  Depression (PHQ2-9): Low Risk   . PHQ-2 Score: 0  Financial Resource Strain:   . Difficulty of Paying Living Expenses: Not on file  Food Insecurity: No Food Insecurity  . Worried About Charity fundraiser in the Last Year: Never true  . Ran Out of Food in the Last Year: Never true  Housing: Low Risk   . Last Housing Risk Score: 0  Physical Activity:   . Days of Exercise per Week: Not on file  . Minutes of Exercise per Session: Not on file  Social Connections:   . Frequency of Communication with Friends and Family: Not on file  . Frequency of Social Gatherings with Friends and Family: Not on file  . Attends Religious Services: Not on file  . Active Member of Clubs or Organizations: Not on file  . Attends Archivist Meetings: Not on file  . Marital Status: Not on file  Stress:   . Feeling of Stress : Not on file  Tobacco Use: Medium Risk  . Smoking Tobacco Use: Former Smoker  .  Smokeless Tobacco Use: Never Used  Transportation Needs: No Transportation Needs  . Lack of Transportation (Medical): No  . Lack of Transportation (Non-Medical): No     Current Diagnosis/Assessment:  Goals Addressed   None      Prediabetes   Recent Relevant Labs: Lab Results  Component Value Date/Time   HGBA1C 5.7 (H) 12/11/2019 09:44 AM     Checking BG: Rarely  Recent FBG Readings: 100-110 mg/dL  Patient has failed these meds in past: n/a Patient is currently controlled on the following medications: diet/lifestyle  We discussed: diet and exercise extensively   Update 05/28/2020 - Patient is working on healthier eating, weight loss and exercise.  Plan  Continue control with diet and exercise and  Hypertension   BP today is:  <130/80  Office blood pressures are  BP Readings from Last 3 Encounters:  06/11/20 124/76  03/20/20 (!) 130/74  01/02/20 116/68    Patient has failed these meds in the past: lisionpril, losartan Patient is currently controlled on the following medications:   Lifestyle  Patient checks BP at home several times per month  Patient home BP readings are ranging: 120/70  We discussed diet and exercise extensively   Update 05/28/2020 - Patient is working on healthier eating, weight loss and exercise.   Plan  Continue control with diet and exercise   Hyperlipidemia   LDL goal < 100  Lipid Panel     Component Value Date/Time   CHOL 199 06/11/2020 1152   TRIG 166 (H) 06/11/2020 1152   HDL 54 06/11/2020 1152   LDLCALC 116 (H) 06/11/2020 1152    Hepatic Function Latest Ref Rng & Units 06/09/2020 03/20/2020 01/02/2020  Total Protein 6.0 - 8.5 g/dL 6.6 6.9 6.5  Albumin 3.8 - 4.8 g/dL 4.3 4.3 4.3  AST 0 - 40 IU/L _0 ALT 0 - 32 IU/L - 32 16  Alk Phosphatase 44 - 121 IU/L 102 99 85  Total Bilirubin 0.0 - 1.2 mg/dL 0.3 0.4 0.3  Bilirubin, Direct 0.00 - 0.40 mg/dL - - -     The 10-year ASCVD risk score Mikey Bussing DC Jr., et al.,  2013) is: 11.9%   Values used to calculate the score:     Age: 16 years     Sex: Female     Is Non-Hispanic African American: No     Diabetic: No     Tobacco smoker: No     Systolic Blood Pressure: 607 mmHg     Is BP treated: Yes     HDL Cholesterol: 54 mg/dL     Total Cholesterol: 199 mg/dL   Patient has failed these meds in past: n/a  Patient is currently query controlled on the following medications:  . Omega-3 2000 mg daily  We discussed:  diet and exercise extensively. Last lipid  panel has been some time ago. Patient reports always in good control due to her lifestyle/diet and fish oil. Discussed updating lipid panel with next lab work.   Update 05/28/2020 - Patient is working on healthier eating, weight loss and exercise. Has labs schedule 05/2020.   Plan  Continue control with diet and exercise   Hypothyroidism   Lab Results  Component Value Date/Time   TSH 2.370 03/31/2020 09:34 AM   TSH 3.489 02/18/2013 10:29 AM    Patient has failed these meds in past: n/a Patient is currently controlled on the following medications:  . Levothyroxine 50 mcg daily   We discussed:   Patient feels that her thyroid could be off making her gain weight. Her TSH is well controlled but patient has been looking online at her symptoms and feel that they could be thyroid related. Patient is experiencing thinning and dry hair which she does acknowledge a possibility of coming from methotrexate therapy.   Update 05/28/2020 - Patient would like full thyroid panel with October labs. She is experiencing lower energy, hair loss and requires a nap daily. She is struggling with weight loss even though she has resumed weight watchers. She is exercising as much as tolerated with her knee pain. She complete PT exercises and walked a mile this week but knee limits this.   Plan  Continue current medications. Consider full thyroid panel with next lab draw.   GERD   Patient has failed these meds in  past: n/a Patient is currently controlled on the following medications:  . pantoprazole 40 mg daily   We discussed:  Patient denies any symptoms of GERD. States that her hoarseness was coming from a projection issue vs. Acid. Denies a history of ulcer or acid irritation. Patient would like to consider tapering off of pantoprazole with Dr. Alyse Low approval. Patient is concerned that it could be affecting her bone health.   Update 05/28/2020 - Patient is going to try pantoprazole every other day. She will monitor for symptoms and increase back to daily if needed. She is working on more water and weight loss currently.   Plan  Recommend pantoprazole every other day. Patient will contact pharmacist with any symptom breakthrough. Pharmacist follow-up in 1 week.   Anxiety    Patient has failed these meds in past: n/a Patient is currently controlled on the following medications:  . citalopram 20 mg daily   We discussed:  Patient reports increased sweating and night sweats lately. She is thinking it could be related to citalopram. Originally during our conversation, patient was hoping to discontinue medication. Discussed risk benefit of reducing or removing medication. Patient has some family stressors that cause increase in anxiety (sister and daughter/granddaughter moving out).  Patient also indicates that she is likely facing knee replacement surgery and opted to delay considering reducing dose or weaning off. Patient will let Dr. Tobie Poet or pharmacist know if she chooses to discontinue medication.   Update 05/28/2020 - Patient's daughter has moved back to [place]. She and her husband are still trying to adjust to this. She reports that her knee replacement is scheduled for 11/30.    Plan  Continue current medications   Osteopenia / Osteoporosis   Last DEXA Scan: 12/01/2017  T-Score femoral neck: -2.0  T-Score lumbar spine: -0.9  10-year probability of major osteoporotic fracture:  18.1%  10-year probability of hip fracture: 3.6%  Vit D, 25-Hydroxy  Date Value Ref Range Status  02/18/2013 60 30 - 89 ng/mL Final  Comment:    This assay accurately quantifies Vitamin D, which is the sum of the 25-Hydroxy forms of Vitamin D2 and D3.  Studies have shown that the optimum concentration of 25-Hydroxy Vitamin D is 30 ng/mL or higher.  Concentrations of Vitamin D between 20 and 29 ng/mL are considered to be insufficient and concentrations less than 20 ng/mL are considered to be deficient for Vitamin D.     Patient is a candidate for pharmacologic treatment due to T-Score -1.0 to -2.5 and 10-year risk of hip fracture > 3%  Patient has failed these meds in past: n/a Patient is currently controlled on the following medications:  . Calcium carbonate-Vitamin D  1200 mg daily . Vitamin D 1000 units tid  We discussed:  Recommend [phone] units of vitamin D daily. Recommend 1200 mg of calcium daily from dietary and supplemental sources. Recommend weight-bearing and muscle strengthening exercises for building and maintaining bone density.   Patient currently taking 2 calciums at bedtime. Discussed the body's ability to absorb calcium and limitations. Patient drinks almond milk and enjoys dark leafy greens, yogurt and cottage cheese. She is going to change calcium to 1 tablet in the evening and keep a log of calcium containing foods in her diet to determine if second dose is necessary.   Patient's last DEXA scan was in 2019 and could be repeated but patient would like to defer until after decision orthopedic appointment at the end of the month.   Plan  Continue current medications   Psoriatic Arthritis   Patient has failed these meds in past: n/a Patient is currently controlled on the following medications:  . Leucovirin 10 mg 24 hours after methotrexate dose . Methotrexate 2.5 mg 6 tablets weekly on Thursdays . Folic acid 1 mg daily  We discussed:  Patient reports good  control of symptoms. Therapy monitored by rheumatology. Patient has discontinued therapy for short-term in the past and notices symptoms of stiffness. Follows up with Dr. Manuella Ghazi this month.   Update 05/28/2020 - Reports well controlled symptoms at this time.   Plan  Continue current medications   Health Maintenance   Patient is currently controlled on the following medications:  . Acetaminophen 325 mg prn - patient not currently taking but has been recommended to take 1000 mg three times daily for pain control  . Macrobid 100 mg 1 capsule post coitally - prn preventative . Valtrex 500 mg daily - prn treatment for flares . Pramipexole 1 mg daily - restless leg syndrome . Multiple vitamin daily - supplementation . Hair/Skin/Nails - supplementation . Magnesium 250 mg daily - supplementation  We discussed:  Patient's pain specialist is completing a trial of oxycodone to determine available pain relief options for post-op. Discussed the benefit of NSAID and Tylenol 1000 mg tid. Patient is on methotrexate and aware of the interaction. She plans to discuss with Dr. Manuella Ghazi and if proceeds to a knee replacement may discontinue methotrexate short-term.   Patient does not feel that her restless leg medication is completely covering symptoms and could also be causing her some problems with vivid dreams, sweating and weight gain. She indicates that her legs and arms cramp up and then have involuntary movements. She would like to consider an alternative if possible.   Lifestyle:    Patient reports decaf coffee/tea. Occasionally has regular tea or pepsi but not daily.   Patient reports that her sleep is interrupted by her knee aching some nights. She gets up to use the restroom once  a night.   Patient previously exercised walking 3 miles a day but has had to stop due to her knee pain. She hopes to resume after injection/ablation/surgery  Patient has had good success with Weight Watchers in the past. She  got off track on vacation and has gained 8 of her 15 lbs back. She hopes to incorporate more fruits and vegetables in her diet and resume weight watchers.   Update 05/28/2020 -  Patient has been drinking more water and avoiding caffeine. She reports that her symptoms are better controlled with 1 mg of pramipexole. She has only had 1 episode in the past 2 weeks. She will let Dr. Tobie Poet know if she needs to increase but thinks the lifestyle modifications are working for now.   Plan  Continue current medications  Vaccines   Reviewed and discussed patient's vaccination history.  Patient does not recall receiving the latest Shingrix shot listed on her history. She plans to check with pharmacy to verify.   Immunization History  Administered Date(s) Administered  . DTaP 12/03/2012  . Fluad Quad(high Dose 65+) 06/11/2020  . PFIZER SARS-COV-2 Vaccination 12/12/2019, 01/10/2020  . Zoster 02/19/2011  . Zoster Recombinat (Shingrix) 05/18/2018   Update 05/28/2020 -  Patient is interested in 3rd COVID vaccine (had vaccine 11/2019 and 12/2019). She plans to get annual flu vaccine in 2 weeks at visit. Pharmacist coordinated with Maudie Mercury to add patient to scheduling list.   Plan  Recommended patient receive annual flu vaccine in office.   Medication Management   Pt uses [facility] for all medications Uses pill box? Yes Pt endorses excellent compliance  We discussed: Patient takes medications out of her pill box. She denies missed doses. Medications are affordable locally through [facility]. Patient and her husband both still drive and enjoy traveling.   Plan  Continue current medication management strategy    Follow up: 1 month phone visit

## 2020-06-24 ENCOUNTER — Telehealth: Payer: Medicare HMO

## 2020-06-30 ENCOUNTER — Ambulatory Visit: Payer: Medicare HMO

## 2020-06-30 ENCOUNTER — Other Ambulatory Visit: Payer: Self-pay

## 2020-06-30 DIAGNOSIS — I10 Essential (primary) hypertension: Secondary | ICD-10-CM

## 2020-06-30 DIAGNOSIS — E782 Mixed hyperlipidemia: Secondary | ICD-10-CM

## 2020-06-30 DIAGNOSIS — R7303 Prediabetes: Secondary | ICD-10-CM

## 2020-06-30 NOTE — Patient Instructions (Addendum)
Visit Information  Goals Addressed            This Visit's Progress   . Pharmacy Care Plan       CARE PLAN ENTRY  Current Barriers:  . Chronic Disease Management support, education, and care coordination needs related to Hypertension and Hyperlipidemia   Hypertension BP Readings from Last 3 Encounters:  01/02/20 116/68  12/11/19 102/64  10/28/19 116/72   . Pharmacist Clinical Goal(s): o Over the next 90 days, patient will work with PharmD and providers to maintain BP goal <130/80 . Current regimen:  o Diet and Lifestyle . Interventions: o Continue healthy habits of riding bicycle at the [facility] and remaining active.  o Reviewed medication regimen and lifestyle modifications.  . Patient self care activities - Over the next 90 days, patient will: o Keep up the good work with [employer] and healthy food choices.  o Check BP as needed, document, and provide at future appointments o Ensure daily salt intake < 2300 mg/day  Hyperlipidemia Lab Results  Component Value Date/Time   LDLCALC 48 02/18/2013 10:29 AM   . Pharmacist Clinical Goal(s): o Over the next 90 days, patient will work with PharmD and providers to maintain LDL goal < 70 . Current regimen:  o Omega-3 2000 mg daily o Aspirin 81 mg daily  . Interventions: o Recommend healthy diet of lean protein, vegetables and fruit.  o Recommend updated lipid panel drawn with next lab work. o Dr. Tobie Poet recommended statin and daily low dose aspirin for patient. Pharmacist reviewed recommendation and coordinating Crestor 10 mg daily sent to [place].  . Patient self care activities - Over the next 90 days, patient will: o Begin taking Crestor 10 mg daily once prescription is available.  o Contact provider or pharmacist with any questions or concerns.   Prediabetes Lab Results  Component Value Date/Time   HGBA1C 5.7 (H) 12/11/2019 09:44 AM   . Pharmacist Clinical Goal(s): o Over the next 90 days, patient will  work with PharmD and providers to maintain A1c goal <6.5% . Current regimen:  o Diet and Lifestyle  . Interventions: o Recommend patient continue to exercise and eat healthy diet.  o Patient discussed limitations of exercise until knee replacement 11/30.  Marland Kitchen Patient self care activities - Over the next 90 days, patient will: o Contact provider with any episodes of hypoglycemia  Medication management . Pharmacist Clinical Goal(s): o Over the next 90 days, patient will work with PharmD and providers to maintain optimal medication adherence . [facility] . Interventions o Comprehensive medication review performed. o Continue current medication management strategy . Patient self care activities - Over the next 90 days, patient will: o Focus on medication adherence by pill box o Take medications as prescribed o Report any questions or concerns to PharmD and/or provider(s)  Please see past updates related to this goal by clicking on the "Past Updates" button in the selected goal         Patient verbalizes understanding of instructions provided today.   Telephone follow up appointment with pharmacy team member scheduled for: 08/2020  Sherre Poot, PharmD, [facility] Clinical Pharmacist [facility] [phone] (office) [phone] (mobile)   DASH Eating Plan DASH stands for "Dietary Approaches to Stop Hypertension." The DASH eating plan is a healthy eating plan that has been shown to reduce high blood pressure (hypertension). It may also reduce your risk for type 2 diabetes, heart disease, and stroke. The DASH eating plan  may also help with weight loss. What are tips for following this plan?  General guidelines  Avoid eating more than 2,300 mg (milligrams) of salt (sodium) a day. If you have hypertension, you may need to reduce your sodium intake to 1,500 mg a day.  Limit alcohol intake to no more than 1 drink a day for nonpregnant women and 2 drinks a day for  men. One drink equals 12 oz of beer, 5 oz of wine, or 1 oz of hard liquor.  Work with your health care provider to maintain a healthy body weight or to lose weight. Ask what an ideal weight is for you.  Get at least 30 minutes of exercise that causes your heart to beat faster (aerobic exercise) most days of the week. Activities may include walking, swimming, or biking.  Work with your health care provider or diet and nutrition specialist (dietitian) to adjust your eating plan to your individual calorie needs. Reading food labels   Check food labels for the amount of sodium per serving. Choose foods with less than 5 percent of the Daily Value of sodium. Generally, foods with less than 300 mg of sodium per serving fit into this eating plan.  To find whole grains, look for the word "whole" as the first word in the ingredient list. Shopping  Buy products labeled as "low-sodium" or "no salt added."  Buy fresh foods. Avoid canned foods and premade or frozen meals. Cooking  Avoid adding salt when cooking. Use salt-free seasonings or herbs instead of table salt or sea salt. Check with your health care provider or pharmacist before using salt substitutes.  Do not fry foods. Cook foods using healthy methods such as baking, boiling, grilling, and broiling instead.  Cook with heart-healthy oils, such as olive, canola, soybean, or sunflower oil. Meal planning  Eat a balanced diet that includes: ? 5 or more servings of fruits and vegetables each day. At each meal, try to fill half of your plate with fruits and vegetables. ? Up to 6-8 servings of whole grains each day. ? Less than 6 oz of lean meat, poultry, or fish each day. A 3-oz serving of meat is about the same size as a deck of cards. One egg equals 1 oz. ? 2 servings of low-fat dairy each day. ? A serving of nuts, seeds, or beans 5 times each week. ? Heart-healthy fats. Healthy fats called Omega-3 fatty acids are found in foods such as  flaxseeds and coldwater fish, like sardines, salmon, and mackerel.  Limit how much you eat of the following: ? Canned or prepackaged foods. ? Food that is high in trans fat, such as fried foods. ? Food that is high in saturated fat, such as fatty meat. ? Sweets, desserts, sugary drinks, and other foods with added sugar. ? Full-fat dairy products.  Do not salt foods before eating.  Try to eat at least 2 vegetarian meals each week.  Eat more home-cooked food and less restaurant, buffet, and fast food.  When eating at a restaurant, ask that your food be prepared with less salt or no salt, if possible. What foods are recommended? The items listed may not be a complete list. Talk with your dietitian about what dietary choices are best for you. Grains Whole-grain or whole-wheat bread. Whole-grain or whole-wheat pasta. Kieth Hartis rice. Modena Morrow. Bulgur. Whole-grain and low-sodium cereals. Pita bread. Low-fat, low-sodium crackers. Whole-wheat flour tortillas. Vegetables Fresh or frozen vegetables (raw, steamed, roasted, or grilled). Low-sodium or reduced-sodium tomato  and vegetable juice. Low-sodium or reduced-sodium tomato sauce and tomato paste. Low-sodium or reduced-sodium canned vegetables. Fruits All fresh, dried, or frozen fruit. Canned fruit in natural juice (without added sugar). Meat and other protein foods Skinless chicken or [place]. Ground chicken or [place]. Pork with fat trimmed off. Fish and seafood. Egg whites. Dried beans, peas, or lentils. Unsalted nuts, nut butters, and seeds. Unsalted canned beans. Lean cuts of beef with fat trimmed off. Low-sodium, lean deli meat. Dairy Low-fat (1%) or fat-free (skim) milk. Fat-free, low-fat, or reduced-fat cheeses. Nonfat, low-sodium ricotta or cottage cheese. Low-fat or nonfat yogurt. Low-fat, low-sodium cheese. Fats and oils Soft margarine without trans fats. Vegetable oil. Low-fat, reduced-fat, or light mayonnaise and salad dressings  (reduced-sodium). Canola, safflower, olive, soybean, and sunflower oils. Avocado. Seasoning and other foods Herbs. Spices. Seasoning mixes without salt. Unsalted popcorn and pretzels. Fat-free sweets. What foods are not recommended? The items listed may not be a complete list. Talk with your dietitian about what dietary choices are best for you. Grains Baked goods made with fat, such as croissants, muffins, or some breads. Dry pasta or rice meal packs. Vegetables Creamed or fried vegetables. Vegetables in a cheese sauce. Regular canned vegetables (not low-sodium or reduced-sodium). Regular canned tomato sauce and paste (not low-sodium or reduced-sodium). Regular tomato and vegetable juice (not low-sodium or reduced-sodium). Angie Fava. Olives. Fruits Canned fruit in a light or heavy syrup. Fried fruit. Fruit in cream or butter sauce. Meat and other protein foods Fatty cuts of meat. Ribs. Fried meat. Berniece Salines. Sausage. Bologna and other processed lunch meats. Salami. Fatback. Hotdogs. Bratwurst. Salted nuts and seeds. Canned beans with added salt. Canned or smoked fish. Whole eggs or egg yolks. Chicken or [place] with skin. Dairy Whole or 2% milk, cream, and half-and-half. Whole or full-fat cream cheese. Whole-fat or sweetened yogurt. Full-fat cheese. Nondairy creamers. Whipped toppings. Processed cheese and cheese spreads. Fats and oils Butter. Stick margarine. Lard. Shortening. Ghee. Bacon fat. Tropical oils, such as coconut, palm kernel, or palm oil. Seasoning and other foods Salted popcorn and pretzels. Onion salt, garlic salt, seasoned salt, table salt, and sea salt. Worcestershire sauce. Tartar sauce. Barbecue sauce. Teriyaki sauce. Soy sauce, including reduced-sodium. Steak sauce. Canned and packaged gravies. Fish sauce. Oyster sauce. Cocktail sauce. Horseradish that you find on the shelf. Ketchup. Mustard. Meat flavorings and tenderizers. Bouillon cubes. Hot sauce and Tabasco sauce. Premade or  packaged marinades. Premade or packaged taco seasonings. Relishes. Regular salad dressings. Where to find more information:  National Heart, Lung, and [place]: https://wilson-eaton.com/  American Heart Association: www.heart.org Summary  The DASH eating plan is a healthy eating plan that has been shown to reduce high blood pressure (hypertension). It may also reduce your risk for type 2 diabetes, heart disease, and stroke.  With the DASH eating plan, you should limit salt (sodium) intake to 2,300 mg a day. If you have hypertension, you may need to reduce your sodium intake to 1,500 mg a day.  When on the DASH eating plan, aim to eat more fresh fruits and vegetables, whole grains, lean proteins, low-fat dairy, and heart-healthy fats.  Work with your health care provider or diet and nutrition specialist (dietitian) to adjust your eating plan to your individual calorie needs. This information is not intended to replace advice given to you by your health care provider. Make sure you discuss any questions you have with your health care provider. Document Revised: 07/21/2017 Document Reviewed: 08/01/2016 Elsevier Patient Education  2020 [employer].

## 2020-06-30 NOTE — Chronic Care Management (AMB) (Signed)
Chronic Care Management Pharmacy  Name: Olivia Cruz  MRN: [id number] DOB: April 27, 1951  Chief Complaint/ HPI  Olivia Cruz,  69 y.o. , female presents for their Follow-Up CCM visit with the clinical pharmacist via telephone due to COVID-19 Pandemic.  PCP : Rochel Brome, MD  Their chronic conditions include: hypertension, hypothyroidism, neuropathy, psoriatic arthritis, prediabetes, hyperlipidemia, RLS.   Office Visits:  06/11/2020 - flu shot given in office.  Consult Visit:    Medications: Outpatient Encounter Medications as of 06/30/2020  Medication Sig  . acetaminophen (TYLENOL) 325 MG tablet Take by mouth every 6 (six) hours as needed.   Marland Kitchen albuterol (PROAIR HFA) 108 (90 Base) MCG/ACT inhaler Inhale two puffs every four to six hours as needed for cough or wheeze.  . budesonide (RHINOCORT ALLERGY) 32 MCG/ACT nasal spray Place 1 spray into both nostrils daily. Reported on 02/04/2016  . Calcium Carbonate-Vitamin D (CALCIUM + D PO) Take 1,200 mg by mouth daily.  . cholecalciferol (VITAMIN D3) 25 MCG (1000 UNIT) tablet Take 1,000 Units by mouth in the morning, at noon, and at bedtime.  . citalopram (CELEXA) 20 MG tablet TAKE 1 TABLET BY MOUTH EVERY DAY AS DIRECTED  . folic acid (FOLVITE) 1 MG tablet Take 1 mg by mouth daily.   Marland Kitchen leucovorin (WELLCOVORIN) 10 MG tablet PLEASE SEE ATTACHED FOR DETAILED DIRECTIONS  . levothyroxine (SYNTHROID) 50 MCG tablet TAKE 1 TABLET BY MOUTH EVERY DAY  . losartan (COZAAR) 25 MG tablet Take 1 tablet (25 mg total) by mouth daily. (Patient not taking: Reported on 04/22/2020)  . Magnesium 250 MG TABS Take 250 mg by mouth daily.  . methotrexate (RHEUMATREX) 2.5 MG tablet TAKE 6 TABLETS (15 MG TOTAL) BY MOUTH ONCE A WEEK FOR 30 DAYS.  Marland Kitchen Multiple Vitamin (MULTIVITAMIN) capsule Take 1 capsule by mouth daily.  . Multiple Vitamins-Minerals (HAIR SKIN AND NAILS FORMULA PO) Take by mouth daily.  . nitrofurantoin, macrocrystal-monohydrate, (MACROBID) 100  MG capsule 1 po orally, post coitally  . Omega-3 Fatty Acids (SUPER OMEGA 3 EPA/DHA) 1000 MG CAPS Take 1,000 mg by mouth daily.   . pantoprazole (PROTONIX) 40 MG tablet TAKE 1 TABLET BY MOUTH EVERY DAY  . pramipexole (MIRAPEX) 1 MG tablet TAKE 1 TABLET (1 MG) BY ORAL ROUTE DAILY  . valACYclovir (VALTREX) 500 MG tablet daily as needed.    No facility-administered encounter medications on file as of 06/30/2020.   Allergies  Allergen Reactions  . Codeine Anaphylaxis and Nausea Only  . Ambien [Zolpidem Tartrate] Other (See Comments)    Memory loss per patient  . Augmentin [Amoxicillin-Pot Clavulanate] Nausea And Vomiting  . Cyproheptadine Hcl Other (See Comments)    Confusion  . Dilaudid [Hydromorphone]     hallucinations  . Hydrocodone Other (See Comments)    anaphylaxis  . Lisinopril   . Oxycodone     Anaphylaxis  . Prednisone   . Sulfa Antibiotics Other (See Comments)    hives  . Tramadol    SDOH Screenings   Alcohol Screen:   . Last Alcohol Screening Score (AUDIT): Not on file  Depression (PHQ2-9): Low Risk   . PHQ-2 Score: 0  Financial Resource Strain:   . Difficulty of Paying Living Expenses: Not on file  Food Insecurity: No Food Insecurity  . Worried About Charity fundraiser in the Last Year: Never true  . Ran Out of Food in the Last Year: Never true  Housing: Low Risk   . Last Housing Risk Score: 0  Physical Activity:   . Days of Exercise per Week: Not on file  . Minutes of Exercise per Session: Not on file  Social Connections:   . Frequency of Communication with Friends and Family: Not on file  . Frequency of Social Gatherings with Friends and Family: Not on file  . Attends Religious Services: Not on file  . Active Member of Clubs or Organizations: Not on file  . Attends Archivist Meetings: Not on file  . Marital Status: Not on file  Stress:   . Feeling of Stress : Not on file  Tobacco Use: Medium Risk  . Smoking Tobacco Use: Former Smoker  .  Smokeless Tobacco Use: Never Used  Transportation Needs: No Transportation Needs  . Lack of Transportation (Medical): No  . Lack of Transportation (Non-Medical): No     Current Diagnosis/Assessment:  Goals Addressed            This Visit's Progress   . Pharmacy Care Plan       CARE PLAN ENTRY  Current Barriers:  . Chronic Disease Management support, education, and care coordination needs related to Hypertension and Hyperlipidemia   Hypertension BP Readings from Last 3 Encounters:  01/02/20 116/68  12/11/19 102/64  10/28/19 116/72   . Pharmacist Clinical Goal(s): o Over the next 90 days, patient will work with PharmD and providers to maintain BP goal <130/80 . Current regimen:  o Diet and Lifestyle . Interventions: o [facility] and [facility].  o Reviewed medication regimen and lifestyle modifications.  . Patient self care activities - Over the next 90 days, patient will: o Keep up the good work with [employer] and healthy food choices.  o Check BP as needed, document, and provide at future appointments o Ensure daily salt intake < 2300 mg/day  Hyperlipidemia Lab Results  Component Value Date/Time   LDLCALC 48 02/18/2013 10:29 AM   . Pharmacist Clinical Goal(s): o Over the next 90 days, patient will work with PharmD and providers to maintain LDL goal < 70 . Current regimen:  o Omega-3 2000 mg daily o Aspirin 81 mg daily  . Interventions: o Recommend healthy diet of lean protein, vegetables and fruit.  o Recommend updated lipid panel drawn with next lab work. o Dr. Tobie Poet recommended statin and daily low dose aspirin for patient. Pharmacist reviewed recommendation and coordinating Crestor 10 mg daily sent to [place].  . Patient self care activities - Over the next 90 days, patient will: o Begin taking Crestor 10 mg daily once prescription is available.  o Contact provider or pharmacist with any  questions or concerns.   Prediabetes Lab Results  Component Value Date/Time   HGBA1C 5.7 (H) 12/11/2019 09:44 AM   . Pharmacist Clinical Goal(s): o Over the next 90 days, patient will work with PharmD and providers to maintain A1c goal <6.5% . Current regimen:  o Diet and Lifestyle  . Interventions: o Recommend patient continue to exercise and eat healthy diet.  o Patient discussed limitations of exercise until knee replacement 11/30.  Marland Kitchen Patient self care activities - Over the next 90 days, patient will: o Contact provider with any episodes of hypoglycemia  Medication management . Pharmacist Clinical Goal(s): o Over the next 90 days, patient will work with PharmD and providers to maintain optimal medication adherence . [facility] . Interventions o Comprehensive medication review performed. o Continue current medication management strategy . Patient self care activities -  Over the next 90 days, patient will: o Focus on medication adherence by pill box o Take medications as prescribed o Report any questions or concerns to PharmD and/or provider(s)  Please see past updates related to this goal by clicking on the "Past Updates" button in the selected goal          Prediabetes   Recent Relevant Labs: Lab Results  Component Value Date/Time   HGBA1C 5.7 (H) 12/11/2019 09:44 AM     Checking BG: Rarely  Recent FBG Readings: 100-110 mg/dL  Patient has failed these meds in past: n/a Patient is currently controlled on the following medications: diet/lifestyle  We discussed: diet and exercise extensively Patient's exercise is currently limited due to knee pain and recent skin cancer removed from chest. Patient reports that her knee surgery is scheduled for 07/21/2020. She will begin physical therapy at that time and hopes to increase exercise after initial recovery.   Plan  Continue control with diet and exercise and  Hypertension   BP today is:   <130/80  Office blood pressures are  BP Readings from Last 3 Encounters:  06/11/20 124/76  03/20/20 (!) 130/74  01/02/20 116/68    Patient has failed these meds in the past: lisionpril, losartan Patient is currently controlled on the following medications:   Lifestyle  Patient checks BP at home several times per month  Patient home BP readings are ranging: 120/70  We discussed diet and exercise extensively Patient is watching diet but unable to exercise at this time.    Plan  Continue control with diet and exercise   Hyperlipidemia   LDL goal < 100  Lipid Panel     Component Value Date/Time   CHOL 199 06/11/2020 1152   TRIG 166 (H) 06/11/2020 1152   HDL 54 06/11/2020 1152   LDLCALC 116 (H) 06/11/2020 1152    Hepatic Function Latest Ref Rng & Units 06/09/2020 03/20/2020 01/02/2020  Total Protein 6.0 - 8.5 g/dL 6.6 6.9 6.5  Albumin 3.8 - 4.8 g/dL 4.3 4.3 4.3  AST 0 - 40 IU/L _0 ALT 0 - 32 IU/L - 32 16  Alk Phosphatase 44 - 121 IU/L 102 99 85  Total Bilirubin 0.0 - 1.2 mg/dL 0.3 0.4 0.3  Bilirubin, Direct 0.00 - 0.40 mg/dL - - -     The 10-year ASCVD risk score Mikey Bussing DC Jr., et al., 2013) is: 14.7%   Values used to calculate the score:     Age: 17 years     Sex: Female     Is Non-Hispanic African American: No     Diabetic: No     Tobacco smoker: No     Systolic Blood Pressure: 945 mmHg     Is BP treated: Yes     HDL Cholesterol: 54 mg/dL     Total Cholesterol: 199 mg/dL   Patient has failed these meds in past: n/a  Patient is currently query controlled on the following medications:  . Omega-3 2000 mg daily . Aspirin 81 mg daily  . Crestor 10 mg daily once available.   We discussed:  diet and exercise extensively. Reviewed updated lipid panel. Patient is reluctant to begin statin but is willing to try Crestor 10 mg daily per Dr. Alyse Low recommendation. Patient acknowledges understanding of the importance of good cholesterol management.    Plan  Continue control with diet and exercise   Hypothyroidism   Lab Results  Component Value Date/Time   TSH 2.370 03/31/2020 09:34  AM   TSH 3.489 02/18/2013 10:29 AM     Patient has failed these meds in past: n/a Patient is currently controlled on the following medications:  . Levothyroxine 50 mcg daily   We discussed:  Patient's most recent TSH within goal. Discussed continuing to take medication as prescribed.   Plan  Continue current medications.   Anxiety    Patient has failed these meds in past: n/a Patient is currently controlled on the following medications:  . citalopram 20 mg daily   We discussed:  Patient is concerned that citalopram is causing some of her leg twitching. She is concerned if her leg twitches post-surgery. She would like to wean off of citalopram completely. Pharmacist discussed upcoming surgery and recommended cutting dose in half if Dr. Tobie Poet agrees. Patient is aware not to stop medication abruptly. Pharmacist to consult with Dr. Tobie Poet for approval of cutting dose in half.    Plan  Continue current medications   Lifestyle:    Patient reports decaf coffee/tea. Occasionally has regular tea or pepsi but not daily.   Patient reports that her sleep is interrupted by her knee aching some nights. She gets up to use the restroom once a night.   Patient previously exercised walking 3 miles a day but has had to stop due to her knee pain. She hopes to resume after injection/ablation/surgery  Patient has had good success with Weight Watchers in the past. She got off track on vacation and has gained 8 of her 15 lbs back. She hopes to incorporate more fruits and vegetables in her diet and resume weight watchers.    Plan  Continue current medications  Vaccines   Reviewed and discussed patient's vaccination history.  Patient does not recall receiving the latest Shingrix shot listed on her history. She plans to check with pharmacy to verify.    Immunization History  Administered Date(s) Administered  . DTaP 12/03/2012  . Fluad Quad(high Dose 65+) 06/11/2020  . PFIZER SARS-COV-2 Vaccination 12/12/2019, 01/10/2020  . Zoster 02/19/2011  . Zoster Recombinat (Shingrix) 05/18/2018   Plan  Recommended patient receive annual flu and third COVID vaccine in office.   Medication Management   [facility] Uses pill box? Yes Pt endorses excellent compliance  We discussed: Patient takes medications out of her pill box. She denies missed doses. [facility]. Patient and her husband both still drive and enjoy traveling.   Plan  Continue current medication management strategy    Follow up: 2 month phone visit

## 2020-07-01 ENCOUNTER — Other Ambulatory Visit: Payer: Self-pay

## 2020-07-01 MED ORDER — ROSUVASTATIN CALCIUM 10 MG PO TABS: 10.0000 mg | ORAL_TABLET | Freq: Every day | ORAL | 0 refills | Status: DC

## 2020-07-02 ENCOUNTER — Ambulatory Visit (INDEPENDENT_AMBULATORY_CARE_PROVIDER_SITE_OTHER): Payer: Medicare HMO

## 2020-07-02 DIAGNOSIS — Z23 Encounter for immunization: Secondary | ICD-10-CM

## 2020-07-02 NOTE — Progress Notes (Signed)
   Covid-19 Vaccination Clinic  Name:  Olivia Cruz    MRN: [id number] DOB: 1950-09-07  07/02/2020  Olivia Cruz was observed post Covid-19 immunization for 15 minutes without incident. She was provided with Vaccine Information Sheet and instruction to access the V-Safe system.   Olivia Cruz was instructed to call 911 with any severe reactions post vaccine: Marland Kitchen Difficulty breathing  . Swelling of face and throat  . A fast heartbeat  . A bad rash all over body  . Dizziness and weakness

## 2020-07-07 ENCOUNTER — Telehealth: Payer: Medicare HMO

## 2020-07-08 DIAGNOSIS — Z79899 Other long term (current) drug therapy: Secondary | ICD-10-CM | POA: Diagnosis not present

## 2020-07-08 DIAGNOSIS — M50323 Other cervical disc degeneration at C6-C7 level: Secondary | ICD-10-CM | POA: Diagnosis not present

## 2020-07-08 DIAGNOSIS — M542 Cervicalgia: Secondary | ICD-10-CM | POA: Diagnosis not present

## 2020-07-08 DIAGNOSIS — M47812 Spondylosis without myelopathy or radiculopathy, cervical region: Secondary | ICD-10-CM | POA: Diagnosis not present

## 2020-07-08 DIAGNOSIS — L4050 Arthropathic psoriasis, unspecified: Secondary | ICD-10-CM | POA: Diagnosis not present

## 2020-07-08 DIAGNOSIS — M8588 Other specified disorders of bone density and structure, other site: Secondary | ICD-10-CM | POA: Diagnosis not present

## 2020-07-12 ENCOUNTER — Other Ambulatory Visit: Payer: Self-pay | Admitting: Family Medicine

## 2020-07-12 NOTE — Progress Notes (Signed)
Reviewed. Kc

## 2020-07-21 DIAGNOSIS — J45909 Unspecified asthma, uncomplicated: Secondary | ICD-10-CM | POA: Diagnosis not present

## 2020-07-21 DIAGNOSIS — E785 Hyperlipidemia, unspecified: Secondary | ICD-10-CM | POA: Diagnosis not present

## 2020-07-21 DIAGNOSIS — E039 Hypothyroidism, unspecified: Secondary | ICD-10-CM | POA: Diagnosis not present

## 2020-07-21 DIAGNOSIS — Z471 Aftercare following joint replacement surgery: Secondary | ICD-10-CM | POA: Diagnosis not present

## 2020-07-21 DIAGNOSIS — Z96651 Presence of right artificial knee joint: Secondary | ICD-10-CM | POA: Diagnosis not present

## 2020-07-21 DIAGNOSIS — G2581 Restless legs syndrome: Secondary | ICD-10-CM | POA: Diagnosis not present

## 2020-07-21 DIAGNOSIS — G8929 Other chronic pain: Secondary | ICD-10-CM | POA: Insufficient documentation

## 2020-07-21 DIAGNOSIS — I10 Essential (primary) hypertension: Secondary | ICD-10-CM | POA: Diagnosis not present

## 2020-07-21 DIAGNOSIS — K219 Gastro-esophageal reflux disease without esophagitis: Secondary | ICD-10-CM | POA: Diagnosis not present

## 2020-07-21 DIAGNOSIS — M1711 Unilateral primary osteoarthritis, right knee: Secondary | ICD-10-CM | POA: Diagnosis not present

## 2020-07-21 DIAGNOSIS — G8918 Other acute postprocedural pain: Secondary | ICD-10-CM | POA: Diagnosis not present

## 2020-07-21 DIAGNOSIS — L4050 Arthropathic psoriasis, unspecified: Secondary | ICD-10-CM | POA: Diagnosis not present

## 2020-07-21 DIAGNOSIS — R69 Illness, unspecified: Secondary | ICD-10-CM | POA: Diagnosis not present

## 2020-07-21 HISTORY — PX: TOTAL KNEE ARTHROPLASTY: SHX125

## 2020-07-21 HISTORY — DX: Other chronic pain: G89.29

## 2020-07-22 DIAGNOSIS — R69 Illness, unspecified: Secondary | ICD-10-CM | POA: Diagnosis not present

## 2020-07-22 DIAGNOSIS — L4050 Arthropathic psoriasis, unspecified: Secondary | ICD-10-CM | POA: Diagnosis not present

## 2020-07-22 DIAGNOSIS — E039 Hypothyroidism, unspecified: Secondary | ICD-10-CM | POA: Diagnosis not present

## 2020-07-22 DIAGNOSIS — J45909 Unspecified asthma, uncomplicated: Secondary | ICD-10-CM | POA: Diagnosis not present

## 2020-07-22 DIAGNOSIS — G2581 Restless legs syndrome: Secondary | ICD-10-CM | POA: Diagnosis not present

## 2020-07-27 DIAGNOSIS — Z96659 Presence of unspecified artificial knee joint: Secondary | ICD-10-CM | POA: Diagnosis not present

## 2020-07-27 DIAGNOSIS — M25661 Stiffness of right knee, not elsewhere classified: Secondary | ICD-10-CM | POA: Diagnosis not present

## 2020-07-27 DIAGNOSIS — M25662 Stiffness of left knee, not elsewhere classified: Secondary | ICD-10-CM | POA: Diagnosis not present

## 2020-07-27 DIAGNOSIS — M170 Bilateral primary osteoarthritis of knee: Secondary | ICD-10-CM | POA: Diagnosis not present

## 2020-07-27 DIAGNOSIS — M25652 Stiffness of left hip, not elsewhere classified: Secondary | ICD-10-CM | POA: Diagnosis not present

## 2020-07-27 DIAGNOSIS — M25461 Effusion, right knee: Secondary | ICD-10-CM | POA: Diagnosis not present

## 2020-07-27 DIAGNOSIS — M25561 Pain in right knee: Secondary | ICD-10-CM | POA: Diagnosis not present

## 2020-07-27 DIAGNOSIS — M6281 Muscle weakness (generalized): Secondary | ICD-10-CM | POA: Diagnosis not present

## 2020-07-27 DIAGNOSIS — R293 Abnormal posture: Secondary | ICD-10-CM | POA: Diagnosis not present

## 2020-07-27 DIAGNOSIS — R2689 Other abnormalities of gait and mobility: Secondary | ICD-10-CM | POA: Diagnosis not present

## 2020-07-29 DIAGNOSIS — M25652 Stiffness of left hip, not elsewhere classified: Secondary | ICD-10-CM | POA: Diagnosis not present

## 2020-07-29 DIAGNOSIS — M170 Bilateral primary osteoarthritis of knee: Secondary | ICD-10-CM | POA: Diagnosis not present

## 2020-07-29 DIAGNOSIS — R293 Abnormal posture: Secondary | ICD-10-CM | POA: Diagnosis not present

## 2020-07-29 DIAGNOSIS — R2689 Other abnormalities of gait and mobility: Secondary | ICD-10-CM | POA: Diagnosis not present

## 2020-07-29 DIAGNOSIS — Z96659 Presence of unspecified artificial knee joint: Secondary | ICD-10-CM | POA: Diagnosis not present

## 2020-07-29 DIAGNOSIS — M6281 Muscle weakness (generalized): Secondary | ICD-10-CM | POA: Diagnosis not present

## 2020-07-29 DIAGNOSIS — M25561 Pain in right knee: Secondary | ICD-10-CM | POA: Diagnosis not present

## 2020-07-29 DIAGNOSIS — M25661 Stiffness of right knee, not elsewhere classified: Secondary | ICD-10-CM | POA: Diagnosis not present

## 2020-07-29 DIAGNOSIS — M25662 Stiffness of left knee, not elsewhere classified: Secondary | ICD-10-CM | POA: Diagnosis not present

## 2020-07-29 DIAGNOSIS — M25461 Effusion, right knee: Secondary | ICD-10-CM | POA: Diagnosis not present

## 2020-07-31 DIAGNOSIS — M25561 Pain in right knee: Secondary | ICD-10-CM | POA: Diagnosis not present

## 2020-07-31 DIAGNOSIS — M25652 Stiffness of left hip, not elsewhere classified: Secondary | ICD-10-CM | POA: Diagnosis not present

## 2020-07-31 DIAGNOSIS — M170 Bilateral primary osteoarthritis of knee: Secondary | ICD-10-CM | POA: Diagnosis not present

## 2020-07-31 DIAGNOSIS — R2689 Other abnormalities of gait and mobility: Secondary | ICD-10-CM | POA: Diagnosis not present

## 2020-07-31 DIAGNOSIS — M6281 Muscle weakness (generalized): Secondary | ICD-10-CM | POA: Diagnosis not present

## 2020-07-31 DIAGNOSIS — Z96659 Presence of unspecified artificial knee joint: Secondary | ICD-10-CM | POA: Diagnosis not present

## 2020-07-31 DIAGNOSIS — M25461 Effusion, right knee: Secondary | ICD-10-CM | POA: Diagnosis not present

## 2020-07-31 DIAGNOSIS — M25662 Stiffness of left knee, not elsewhere classified: Secondary | ICD-10-CM | POA: Diagnosis not present

## 2020-07-31 DIAGNOSIS — M25661 Stiffness of right knee, not elsewhere classified: Secondary | ICD-10-CM | POA: Diagnosis not present

## 2020-07-31 DIAGNOSIS — R293 Abnormal posture: Secondary | ICD-10-CM | POA: Diagnosis not present

## 2020-08-03 DIAGNOSIS — R293 Abnormal posture: Secondary | ICD-10-CM | POA: Diagnosis not present

## 2020-08-03 DIAGNOSIS — M170 Bilateral primary osteoarthritis of knee: Secondary | ICD-10-CM | POA: Diagnosis not present

## 2020-08-03 DIAGNOSIS — M25662 Stiffness of left knee, not elsewhere classified: Secondary | ICD-10-CM | POA: Diagnosis not present

## 2020-08-03 DIAGNOSIS — M25461 Effusion, right knee: Secondary | ICD-10-CM | POA: Diagnosis not present

## 2020-08-03 DIAGNOSIS — M6281 Muscle weakness (generalized): Secondary | ICD-10-CM | POA: Diagnosis not present

## 2020-08-03 DIAGNOSIS — M25652 Stiffness of left hip, not elsewhere classified: Secondary | ICD-10-CM | POA: Diagnosis not present

## 2020-08-03 DIAGNOSIS — R2689 Other abnormalities of gait and mobility: Secondary | ICD-10-CM | POA: Diagnosis not present

## 2020-08-03 DIAGNOSIS — Z96659 Presence of unspecified artificial knee joint: Secondary | ICD-10-CM | POA: Diagnosis not present

## 2020-08-03 DIAGNOSIS — M25561 Pain in right knee: Secondary | ICD-10-CM | POA: Diagnosis not present

## 2020-08-03 DIAGNOSIS — M25661 Stiffness of right knee, not elsewhere classified: Secondary | ICD-10-CM | POA: Diagnosis not present

## 2020-08-04 DIAGNOSIS — Z96659 Presence of unspecified artificial knee joint: Secondary | ICD-10-CM | POA: Diagnosis not present

## 2020-08-04 DIAGNOSIS — M25652 Stiffness of left hip, not elsewhere classified: Secondary | ICD-10-CM | POA: Diagnosis not present

## 2020-08-04 DIAGNOSIS — M6281 Muscle weakness (generalized): Secondary | ICD-10-CM | POA: Diagnosis not present

## 2020-08-04 DIAGNOSIS — M25461 Effusion, right knee: Secondary | ICD-10-CM | POA: Diagnosis not present

## 2020-08-04 DIAGNOSIS — M170 Bilateral primary osteoarthritis of knee: Secondary | ICD-10-CM | POA: Diagnosis not present

## 2020-08-04 DIAGNOSIS — M25662 Stiffness of left knee, not elsewhere classified: Secondary | ICD-10-CM | POA: Diagnosis not present

## 2020-08-04 DIAGNOSIS — M25561 Pain in right knee: Secondary | ICD-10-CM | POA: Diagnosis not present

## 2020-08-04 DIAGNOSIS — R2689 Other abnormalities of gait and mobility: Secondary | ICD-10-CM | POA: Diagnosis not present

## 2020-08-04 DIAGNOSIS — M25661 Stiffness of right knee, not elsewhere classified: Secondary | ICD-10-CM | POA: Diagnosis not present

## 2020-08-04 DIAGNOSIS — R293 Abnormal posture: Secondary | ICD-10-CM | POA: Diagnosis not present

## 2020-08-05 DIAGNOSIS — Z96651 Presence of right artificial knee joint: Secondary | ICD-10-CM | POA: Diagnosis not present

## 2020-08-05 DIAGNOSIS — Z471 Aftercare following joint replacement surgery: Secondary | ICD-10-CM | POA: Diagnosis not present

## 2020-08-07 DIAGNOSIS — M25662 Stiffness of left knee, not elsewhere classified: Secondary | ICD-10-CM | POA: Diagnosis not present

## 2020-08-07 DIAGNOSIS — M25652 Stiffness of left hip, not elsewhere classified: Secondary | ICD-10-CM | POA: Diagnosis not present

## 2020-08-07 DIAGNOSIS — M25661 Stiffness of right knee, not elsewhere classified: Secondary | ICD-10-CM | POA: Diagnosis not present

## 2020-08-07 DIAGNOSIS — R293 Abnormal posture: Secondary | ICD-10-CM | POA: Diagnosis not present

## 2020-08-07 DIAGNOSIS — M6281 Muscle weakness (generalized): Secondary | ICD-10-CM | POA: Diagnosis not present

## 2020-08-07 DIAGNOSIS — M25561 Pain in right knee: Secondary | ICD-10-CM | POA: Diagnosis not present

## 2020-08-07 DIAGNOSIS — R2689 Other abnormalities of gait and mobility: Secondary | ICD-10-CM | POA: Diagnosis not present

## 2020-08-07 DIAGNOSIS — M170 Bilateral primary osteoarthritis of knee: Secondary | ICD-10-CM | POA: Diagnosis not present

## 2020-08-07 DIAGNOSIS — M25461 Effusion, right knee: Secondary | ICD-10-CM | POA: Diagnosis not present

## 2020-08-07 DIAGNOSIS — Z96659 Presence of unspecified artificial knee joint: Secondary | ICD-10-CM | POA: Diagnosis not present

## 2020-08-10 DIAGNOSIS — M170 Bilateral primary osteoarthritis of knee: Secondary | ICD-10-CM | POA: Diagnosis not present

## 2020-08-10 DIAGNOSIS — M25561 Pain in right knee: Secondary | ICD-10-CM | POA: Diagnosis not present

## 2020-08-10 DIAGNOSIS — R2689 Other abnormalities of gait and mobility: Secondary | ICD-10-CM | POA: Diagnosis not present

## 2020-08-10 DIAGNOSIS — Z96659 Presence of unspecified artificial knee joint: Secondary | ICD-10-CM | POA: Diagnosis not present

## 2020-08-10 DIAGNOSIS — M25652 Stiffness of left hip, not elsewhere classified: Secondary | ICD-10-CM | POA: Diagnosis not present

## 2020-08-10 DIAGNOSIS — R293 Abnormal posture: Secondary | ICD-10-CM | POA: Diagnosis not present

## 2020-08-10 DIAGNOSIS — M6281 Muscle weakness (generalized): Secondary | ICD-10-CM | POA: Diagnosis not present

## 2020-08-10 DIAGNOSIS — M25661 Stiffness of right knee, not elsewhere classified: Secondary | ICD-10-CM | POA: Diagnosis not present

## 2020-08-10 DIAGNOSIS — M25461 Effusion, right knee: Secondary | ICD-10-CM | POA: Diagnosis not present

## 2020-08-10 DIAGNOSIS — M25662 Stiffness of left knee, not elsewhere classified: Secondary | ICD-10-CM | POA: Diagnosis not present

## 2020-08-12 DIAGNOSIS — M25662 Stiffness of left knee, not elsewhere classified: Secondary | ICD-10-CM | POA: Diagnosis not present

## 2020-08-12 DIAGNOSIS — M170 Bilateral primary osteoarthritis of knee: Secondary | ICD-10-CM | POA: Diagnosis not present

## 2020-08-12 DIAGNOSIS — Z96659 Presence of unspecified artificial knee joint: Secondary | ICD-10-CM | POA: Diagnosis not present

## 2020-08-12 DIAGNOSIS — M25561 Pain in right knee: Secondary | ICD-10-CM | POA: Diagnosis not present

## 2020-08-12 DIAGNOSIS — R293 Abnormal posture: Secondary | ICD-10-CM | POA: Diagnosis not present

## 2020-08-12 DIAGNOSIS — M6281 Muscle weakness (generalized): Secondary | ICD-10-CM | POA: Diagnosis not present

## 2020-08-12 DIAGNOSIS — M25461 Effusion, right knee: Secondary | ICD-10-CM | POA: Diagnosis not present

## 2020-08-12 DIAGNOSIS — M25652 Stiffness of left hip, not elsewhere classified: Secondary | ICD-10-CM | POA: Diagnosis not present

## 2020-08-12 DIAGNOSIS — R2689 Other abnormalities of gait and mobility: Secondary | ICD-10-CM | POA: Diagnosis not present

## 2020-08-12 DIAGNOSIS — M25661 Stiffness of right knee, not elsewhere classified: Secondary | ICD-10-CM | POA: Diagnosis not present

## 2020-08-17 DIAGNOSIS — M6281 Muscle weakness (generalized): Secondary | ICD-10-CM | POA: Diagnosis not present

## 2020-08-17 DIAGNOSIS — M25652 Stiffness of left hip, not elsewhere classified: Secondary | ICD-10-CM | POA: Diagnosis not present

## 2020-08-17 DIAGNOSIS — R293 Abnormal posture: Secondary | ICD-10-CM | POA: Diagnosis not present

## 2020-08-17 DIAGNOSIS — M25461 Effusion, right knee: Secondary | ICD-10-CM | POA: Diagnosis not present

## 2020-08-17 DIAGNOSIS — M25661 Stiffness of right knee, not elsewhere classified: Secondary | ICD-10-CM | POA: Diagnosis not present

## 2020-08-17 DIAGNOSIS — M25662 Stiffness of left knee, not elsewhere classified: Secondary | ICD-10-CM | POA: Diagnosis not present

## 2020-08-17 DIAGNOSIS — M170 Bilateral primary osteoarthritis of knee: Secondary | ICD-10-CM | POA: Diagnosis not present

## 2020-08-17 DIAGNOSIS — R2689 Other abnormalities of gait and mobility: Secondary | ICD-10-CM | POA: Diagnosis not present

## 2020-08-17 DIAGNOSIS — Z96659 Presence of unspecified artificial knee joint: Secondary | ICD-10-CM | POA: Diagnosis not present

## 2020-08-17 DIAGNOSIS — M25561 Pain in right knee: Secondary | ICD-10-CM | POA: Diagnosis not present

## 2020-08-19 DIAGNOSIS — M25561 Pain in right knee: Secondary | ICD-10-CM | POA: Diagnosis not present

## 2020-08-19 DIAGNOSIS — M25652 Stiffness of left hip, not elsewhere classified: Secondary | ICD-10-CM | POA: Diagnosis not present

## 2020-08-19 DIAGNOSIS — M170 Bilateral primary osteoarthritis of knee: Secondary | ICD-10-CM | POA: Diagnosis not present

## 2020-08-19 DIAGNOSIS — M25461 Effusion, right knee: Secondary | ICD-10-CM | POA: Diagnosis not present

## 2020-08-19 DIAGNOSIS — M25662 Stiffness of left knee, not elsewhere classified: Secondary | ICD-10-CM | POA: Diagnosis not present

## 2020-08-19 DIAGNOSIS — R293 Abnormal posture: Secondary | ICD-10-CM | POA: Diagnosis not present

## 2020-08-19 DIAGNOSIS — M6281 Muscle weakness (generalized): Secondary | ICD-10-CM | POA: Diagnosis not present

## 2020-08-19 DIAGNOSIS — R2689 Other abnormalities of gait and mobility: Secondary | ICD-10-CM | POA: Diagnosis not present

## 2020-08-19 DIAGNOSIS — Z96659 Presence of unspecified artificial knee joint: Secondary | ICD-10-CM | POA: Diagnosis not present

## 2020-08-19 DIAGNOSIS — M25661 Stiffness of right knee, not elsewhere classified: Secondary | ICD-10-CM | POA: Diagnosis not present

## 2020-08-23 DIAGNOSIS — R0600 Dyspnea, unspecified: Secondary | ICD-10-CM | POA: Diagnosis not present

## 2020-08-23 DIAGNOSIS — R0602 Shortness of breath: Secondary | ICD-10-CM | POA: Diagnosis not present

## 2020-08-23 DIAGNOSIS — R7989 Other specified abnormal findings of blood chemistry: Secondary | ICD-10-CM | POA: Diagnosis not present

## 2020-08-24 DIAGNOSIS — M25662 Stiffness of left knee, not elsewhere classified: Secondary | ICD-10-CM | POA: Diagnosis not present

## 2020-08-24 DIAGNOSIS — M25661 Stiffness of right knee, not elsewhere classified: Secondary | ICD-10-CM | POA: Diagnosis not present

## 2020-08-24 DIAGNOSIS — M25561 Pain in right knee: Secondary | ICD-10-CM | POA: Diagnosis not present

## 2020-08-24 DIAGNOSIS — Z96659 Presence of unspecified artificial knee joint: Secondary | ICD-10-CM | POA: Diagnosis not present

## 2020-08-24 DIAGNOSIS — M170 Bilateral primary osteoarthritis of knee: Secondary | ICD-10-CM | POA: Diagnosis not present

## 2020-08-24 DIAGNOSIS — R2689 Other abnormalities of gait and mobility: Secondary | ICD-10-CM | POA: Diagnosis not present

## 2020-08-24 DIAGNOSIS — M6281 Muscle weakness (generalized): Secondary | ICD-10-CM | POA: Diagnosis not present

## 2020-08-24 DIAGNOSIS — M25562 Pain in left knee: Secondary | ICD-10-CM | POA: Diagnosis not present

## 2020-08-24 DIAGNOSIS — R293 Abnormal posture: Secondary | ICD-10-CM | POA: Diagnosis not present

## 2020-08-26 DIAGNOSIS — M25661 Stiffness of right knee, not elsewhere classified: Secondary | ICD-10-CM | POA: Diagnosis not present

## 2020-08-26 DIAGNOSIS — M170 Bilateral primary osteoarthritis of knee: Secondary | ICD-10-CM | POA: Diagnosis not present

## 2020-08-26 DIAGNOSIS — R2689 Other abnormalities of gait and mobility: Secondary | ICD-10-CM | POA: Diagnosis not present

## 2020-08-26 DIAGNOSIS — M25562 Pain in left knee: Secondary | ICD-10-CM | POA: Diagnosis not present

## 2020-08-26 DIAGNOSIS — Z96659 Presence of unspecified artificial knee joint: Secondary | ICD-10-CM | POA: Diagnosis not present

## 2020-08-26 DIAGNOSIS — M6281 Muscle weakness (generalized): Secondary | ICD-10-CM | POA: Diagnosis not present

## 2020-08-26 DIAGNOSIS — R293 Abnormal posture: Secondary | ICD-10-CM | POA: Diagnosis not present

## 2020-08-26 DIAGNOSIS — M25662 Stiffness of left knee, not elsewhere classified: Secondary | ICD-10-CM | POA: Diagnosis not present

## 2020-08-26 DIAGNOSIS — M25561 Pain in right knee: Secondary | ICD-10-CM | POA: Diagnosis not present

## 2020-08-28 ENCOUNTER — Ambulatory Visit: Payer: Medicare HMO

## 2020-08-28 ENCOUNTER — Other Ambulatory Visit: Payer: Self-pay

## 2020-08-28 DIAGNOSIS — R2689 Other abnormalities of gait and mobility: Secondary | ICD-10-CM | POA: Diagnosis not present

## 2020-08-28 DIAGNOSIS — Z96659 Presence of unspecified artificial knee joint: Secondary | ICD-10-CM | POA: Diagnosis not present

## 2020-08-28 DIAGNOSIS — M170 Bilateral primary osteoarthritis of knee: Secondary | ICD-10-CM | POA: Diagnosis not present

## 2020-08-28 DIAGNOSIS — M25662 Stiffness of left knee, not elsewhere classified: Secondary | ICD-10-CM | POA: Diagnosis not present

## 2020-08-28 DIAGNOSIS — M25661 Stiffness of right knee, not elsewhere classified: Secondary | ICD-10-CM | POA: Diagnosis not present

## 2020-08-28 DIAGNOSIS — I10 Essential (primary) hypertension: Secondary | ICD-10-CM

## 2020-08-28 DIAGNOSIS — M25561 Pain in right knee: Secondary | ICD-10-CM | POA: Diagnosis not present

## 2020-08-28 DIAGNOSIS — M25562 Pain in left knee: Secondary | ICD-10-CM | POA: Diagnosis not present

## 2020-08-28 DIAGNOSIS — R293 Abnormal posture: Secondary | ICD-10-CM | POA: Diagnosis not present

## 2020-08-28 DIAGNOSIS — E782 Mixed hyperlipidemia: Secondary | ICD-10-CM

## 2020-08-28 DIAGNOSIS — M6281 Muscle weakness (generalized): Secondary | ICD-10-CM | POA: Diagnosis not present

## 2020-08-28 MED ORDER — CITALOPRAM HYDROBROMIDE 20 MG PO TABS
20.0000 mg | ORAL_TABLET | Freq: Every day | ORAL | 1 refills | Status: DC
Start: 2020-08-28 — End: 2020-10-22

## 2020-08-28 MED ORDER — PRAMIPEXOLE DIHYDROCHLORIDE 1 MG PO TABS: ORAL_TABLET | ORAL | 1 refills | Status: DC

## 2020-08-28 NOTE — Chronic Care Management (AMB) (Signed)
Chronic Care Management Pharmacy  Name: Olivia Cruz  MRN: [id number] DOB: [birth year]  Chief Complaint/ HPI  Olivia Cruz,  70 y.o. , female presents for their Follow-Up CCM visit with the clinical pharmacist via telephone due to COVID-19 Pandemic.  PCP : Olivia Brome, MD  Their chronic conditions include: hypertension, hypothyroidism, neuropathy, psoriatic arthritis, prediabetes, hyperlipidemia, RLS.   Office Visits:  07/02/2020 - pfizer booster   06/11/2020 - flu shot given in office.  Consult Visit:  08/05/2020 - ortho - 2 week follow-up for knee replacement. Continue TED hose 4 weeks post op for DVT prophylaxis. Oxycodone refilled.   07/21/2020 - knee replacement  07/08/2020 - rheumatology - continue methotrexate 15 mg weekly.   Medications: Outpatient Encounter Medications as of 08/28/2020  Medication Sig  . acetaminophen (TYLENOL) 325 MG tablet Take by mouth every 6 (six) hours as needed.   Marland Kitchen albuterol (PROAIR HFA) 108 (90 Base) MCG/ACT inhaler Inhale two puffs every four to six hours as needed for cough or wheeze.  . budesonide (RHINOCORT AQUA) 32 MCG/ACT nasal spray Place 1 spray into both nostrils daily. Reported on 02/04/2016  . Calcium Carbonate-Vitamin D (CALCIUM + D PO) Take 1,200 mg by mouth daily.  . cholecalciferol (VITAMIN D3) 25 MCG (1000 UNIT) tablet Take 1,000 Units by mouth in the morning, at noon, and at bedtime.  . citalopram (CELEXA) 20 MG tablet TAKE 1 TABLET BY MOUTH EVERY DAY AS DIRECTED  . folic acid (FOLVITE) 1 MG tablet Take 1 mg by mouth daily.   Marland Kitchen levothyroxine (SYNTHROID) 50 MCG tablet TAKE 1 TABLET BY MOUTH EVERY DAY  . Magnesium 250 MG TABS Take 250 mg by mouth daily.  . methotrexate (RHEUMATREX) 2.5 MG tablet TAKE 6 TABLETS (15 MG TOTAL) BY MOUTH ONCE A WEEK FOR 30 DAYS.  Marland Kitchen Multiple Vitamin (MULTIVITAMIN) capsule Take 1 capsule by mouth daily.  . Multiple Vitamins-Minerals (HAIR SKIN AND NAILS FORMULA PO) Take by mouth daily.  .  nitrofurantoin, macrocrystal-monohydrate, (MACROBID) 100 MG capsule 1 po orally, post coitally  . Omega-3 Fatty Acids (SUPER OMEGA 3 EPA/DHA) 1000 MG CAPS Take 1,000 mg by mouth daily.   Marland Kitchen oxyCODONE (OXY IR/ROXICODONE) 5 MG immediate release tablet Take by mouth.  . pantoprazole (PROTONIX) 40 MG tablet TAKE 1 TABLET BY MOUTH EVERY DAY  . pramipexole (MIRAPEX) 1 MG tablet TAKE 1 TABLET (1 MG) BY ORAL ROUTE DAILY  . rosuvastatin (CRESTOR) 10 MG tablet Take 1 tablet (10 mg total) by mouth daily.  . valACYclovir (VALTREX) 500 MG tablet daily as needed.   Marland Kitchen leucovorin (WELLCOVORIN) 10 MG tablet PLEASE SEE ATTACHED FOR DETAILED DIRECTIONS (Patient not taking: Reported on 08/28/2020)  . losartan (COZAAR) 25 MG tablet Take 1 tablet (25 mg total) by mouth daily. (Patient not taking: Reported on 08/28/2020)   No facility-administered encounter medications on file as of 08/28/2020.   Allergies  Allergen Reactions  . Codeine Anaphylaxis and Nausea Only  . Ambien [Zolpidem Tartrate] Other (See Comments)    Memory loss per patient  . Augmentin [Amoxicillin-Pot Clavulanate] Nausea And Vomiting  . Cyproheptadine Hcl Other (See Comments)    Confusion  . Dilaudid [Hydromorphone]     hallucinations  . Hydrocodone Other (See Comments)    anaphylaxis  . Lisinopril   . Oxycodone     Anaphylaxis  . Prednisone   . Sulfa Antibiotics Other (See Comments)    hives  . Tramadol    SDOH Screenings   Alcohol Screen: Not on file  Depression (PHQ2-9): Low Risk   . PHQ-2 Score: 0  Financial Resource Strain: Not on file  Food Insecurity: No Food Insecurity  . Worried About Charity fundraiser in the Last Year: Never true  . Ran Out of Food in the Last Year: Never true  Housing: Low Risk   . Last Housing Risk Score: 0  Physical Activity: Not on file  Social Connections: Not on file  Stress: Not on file  Tobacco Use: Medium Risk  . Smoking Tobacco Use: Former Smoker  . Smokeless Tobacco Use: Never Used   Transportation Needs: No Transportation Needs  . Lack of Transportation (Medical): No  . Lack of Transportation (Non-Medical): No     Current Diagnosis/Assessment:  Goals Addressed            This Visit's Progress   . Pharmacy Care Plan       CARE PLAN ENTRY  Current Barriers:  . Chronic Disease Management support, education, and care coordination needs related to Hypertension and Hyperlipidemia   Hypertension BP Readings from Last 3 Encounters:  01/02/20 116/68  12/11/19 102/64  10/28/19 116/72   . Pharmacist Clinical Goal(s): o Over the next 90 days, patient will work with PharmD and providers to maintain BP goal <130/80 . Current regimen:  o Diet and Lifestyle . Interventions: o Continue physical therapy exercises.  o Reviewed medication regimen and lifestyle modifications.  . Patient self care activities - Over the next 90 days, patient will: o Keep up the good work with [employer] and healthy food choices.  o Check BP as needed, document, and provide at future appointments o Ensure daily salt intake < 2300 mg/day  Hyperlipidemia Lab Results  Component Value Date/Time   LDLCALC 48 02/18/2013 10:29 AM   . Pharmacist Clinical Goal(s): o Over the next 90 days, patient will work with PharmD and providers to maintain LDL goal < 70 . Current regimen:  o Omega-3 2000 mg daily o Aspirin 81 mg daily  o Crestor 10 mg daily  . Interventions: o Recommend healthy diet of lean protein, vegetables and fruit.  o Recommend updated lipid panel drawn with next lab work.  . Patient self care activities - Over the next 90 days, patient will: o Contact provider or pharmacist with any questions or concerns.   Prediabetes Lab Results  Component Value Date/Time   HGBA1C 5.7 (H) 12/11/2019 09:44 AM   . Pharmacist Clinical Goal(s): o Over the next 90 days, patient will work with PharmD and providers to maintain A1c goal <6.5% . Current regimen:  o Diet and Lifestyle   . Interventions: o Recommend patient continue to exercise and eat healthy diet.  o Patient continuing with physical therapy for exercise.  . Patient self care activities - Over the next 90 days, patient will: o Contact provider with any episodes of hypoglycemia  Medication management . Pharmacist Clinical Goal(s): o Over the next 90 days, patient will work with PharmD and providers to maintain optimal medication adherence . [facility] . Interventions o Comprehensive medication review performed. o Continue current medication management strategy . Patient self care activities - Over the next 90 days, patient will: o Focus on medication adherence by pill box o Take medications as prescribed o Report any questions or concerns to PharmD and/or provider(s)  Please see past updates related to this goal by clicking on the "Past Updates" button in the selected goal          Prediabetes   Recent  Relevant Labs: Lab Results  Component Value Date/Time   HGBA1C 5.7 (H) 12/11/2019 09:44 AM     Checking BG: Rarely  Recent FBG Readings: 100-110 mg/dL  Patient has failed these meds in past: n/a Patient is currently controlled on the following medications: diet/lifestyle  We discussed: diet and exercise extensively Patient reports weight loss since knee replacement. Exercising regularly with PT. Will see Dr. Tobie Poet for updated labs in 2 weeks.   Plan  Continue control with diet and exercise and  Hypertension   BP today is:  <130/80  Office blood pressures are  BP Readings from Last 3 Encounters:  06/11/20 124/76  03/20/20 (!) 130/74  01/02/20 116/68    Patient has failed these meds in the past: lisionpril, losartan Patient is currently controlled on the following medications:   Lifestyle  Patient checks BP at home several times per month  Patient home BP readings are ranging: 125/80  We discussed diet and exercise extensively Patient has limited appetite since  knee surgery. Exercising with PT. Reports well managed bp during recovery.    Plan  Continue control with diet and exercise   Hyperlipidemia   LDL goal < 100  Lipid Panel     Component Value Date/Time   CHOL 199 06/11/2020 1152   TRIG 166 (H) 06/11/2020 1152   HDL 54 06/11/2020 1152   LDLCALC 116 (H) 06/11/2020 1152    Hepatic Function Latest Ref Rng & Units 06/09/2020 03/20/2020 01/02/2020  Total Protein 6.0 - 8.5 g/dL 6.6 6.9 6.5  Albumin 3.8 - 4.8 g/dL 4.3 4.3 4.3  AST 0 - 40 IU/L _0 ALT 0 - 32 IU/L - 32 16  Alk Phosphatase 44 - 121 IU/L 102 99 85  Total Bilirubin 0.0 - 1.2 mg/dL 0.3 0.4 0.3  Bilirubin, Direct 0.00 - 0.40 mg/dL - - -     The 10-year ASCVD risk score Mikey Bussing DC Jr., et al., 2013) is: 14.1%   Values used to calculate the score:     Age: 47 years     Sex: Female     Is Non-Hispanic African American: No     Diabetic: No     Tobacco smoker: No     Systolic Blood Pressure: 100 mmHg     Is BP treated: Yes     HDL Cholesterol: 54 mg/dL     Total Cholesterol: 199 mg/dL   Patient has failed these meds in past: n/a  Patient is currently query controlled on the following medications:  . Omega-3 2000 mg daily . Aspirin 81 mg daily  . Crestor 10 mg daily  We discussed:  diet and exercise extensively. Patient has updated labs ordered for this month. Reports taking medication as prescribed. Limited appetite has helped with weight loss. Staying active with PT exercises.   Plan  Continue control with diet and exercise   Hypothyroidism   Lab Results  Component Value Date/Time   TSH 2.370 03/31/2020 09:34 AM   TSH 3.489 02/18/2013 10:29 AM   Patient has failed these meds in past: n/a Patient is currently controlled on the following medications:  . Levothyroxine 50 mcg daily   We discussed:  Patient's most recent TSH within goal. Discussed continuing to take medication as prescribed.   Plan  Continue current medications.   Anxiety    Patient  has failed these meds in past: n/a Patient is currently controlled on the following medications:  . citalopram 20 mg daily   We discussed:  Patient cut dose in half recently and was tolerating well but during recent recovery feels that she needs to be on 20 mg dose again. Patient will run out of medication this weekend. Pharmacist coordinated nursing to put in request for Dr. Henrene Pastor to sign since Dr. Tobie Poet is out of the office. Patient reports that recovery has been mentally and physically challenging. Feels that the citalopram 20 mg daily can help her recover. She ultimately would like to discontinue medication after recovery.     Plan  Continue current medications   Lifestyle:    Patient reports decaf coffee/tea. Occasionally has regular tea or pepsi but not daily.   Patient reports that her sleep is interrupted by her knee aching some nights. She gets up to use the restroom once a night.   Patient previously exercised walking 3 miles a day but has had to stop due to her knee pain. She hopes to resume after injection/ablation/surgery  Patient has had good success with Weight Watchers in the past. She got off track on vacation and has gained 8 of her 15 lbs back. She hopes to incorporate more fruits and vegetables in her diet and resume weight watchers.    Plan  Continue current medications   Health Maintenance   Patient is currently controlled on the following medications:  . Pramipexole 1 mg daily - restless leg syndrome . Oxycodone prn pain with knee recovery . Docusate daily while on oxycodone  We discussed:  Patient reports symptoms of restless legs is improved since surgery. Not sure if it is due to pain medication or surgery. Patient would like to stop taking but has not at this time. Needs refill today until visit with Dr. Tobie Poet in 2 weeks. Pharmacist coordinate with nursing to send request to Dr. Henrene Pastor since Dr. Tobie Poet out of office.   Docusate is managing constipation with  oxycodone for the most part. Patient states occasionally she feels like she is developing symptoms of constipation. She is drinking water, prune juice and using miralax prn.   Plan  Continue current medications   Vaccines   Reviewed and discussed patient's vaccination history.  Patient does not recall receiving the latest Shingrix shot listed on her history. She plans to check with pharmacy to verify.   Immunization History  Administered Date(s) Administered  . DTaP 12/03/2012  . Fluad Quad(high Dose 65+) 06/11/2020  . PFIZER SARS-COV-2 Vaccination 12/12/2019, 01/10/2020, 07/02/2020  . Zoster 02/19/2011  . Zoster Recombinat (Shingrix) 05/18/2018   Plan  Patient up to date on flu and COVID vaccine.   Medication Management   [facility] Uses pill box? Yes Pt endorses excellent compliance  We discussed: Patient takes medications out of her pill box. She denies missed doses. Patient and her husband both still drive and enjoy traveling.  Patient reports that her insurance has changed to [facility] this year. She hopes to transition her medications to [facility] mail order for $0 copay. Will discuss with Dr. Tobie Poet at upcoming visit.    Plan  Continue current medication management strategy    Follow up: 4 month phone visit

## 2020-08-28 NOTE — Patient Instructions (Addendum)
Visit Information  Goals Addressed            This Visit's Progress   . Pharmacy Care Plan       CARE PLAN ENTRY  Current Barriers:  . Chronic Disease Management support, education, and care coordination needs related to Hypertension and Hyperlipidemia   Hypertension BP Readings from Last 3 Encounters:  01/02/20 116/68  12/11/19 102/64  10/28/19 116/72   . Pharmacist Clinical Goal(s): o Over the next 90 days, patient will work with PharmD and providers to maintain BP goal <130/80 . Current regimen:  o Diet and Lifestyle . Interventions: o Continue physical therapy exercises.  o Reviewed medication regimen and lifestyle modifications.  . Patient self care activities - Over the next 90 days, patient will: o Keep up the good work with [employer] and healthy food choices.  o Check BP as needed, document, and provide at future appointments o Ensure daily salt intake < 2300 mg/day  Hyperlipidemia Lab Results  Component Value Date/Time   LDLCALC 48 02/18/2013 10:29 AM   . Pharmacist Clinical Goal(s): o Over the next 90 days, patient will work with PharmD and providers to maintain LDL goal < 70 . Current regimen:  o Omega-3 2000 mg daily o Aspirin 81 mg daily  o Crestor 10 mg daily  . Interventions: o Recommend healthy diet of lean protein, vegetables and fruit.  o Recommend updated lipid panel drawn with next lab work.  . Patient self care activities - Over the next 90 days, patient will: o Contact provider or pharmacist with any questions or concerns.   Prediabetes Lab Results  Component Value Date/Time   HGBA1C 5.7 (H) 12/11/2019 09:44 AM   . Pharmacist Clinical Goal(s): o Over the next 90 days, patient will work with PharmD and providers to maintain A1c goal <6.5% . Current regimen:  o Diet and Lifestyle  . Interventions: o Recommend patient continue to exercise and eat healthy diet.  o Patient continuing with physical therapy for exercise.  . Patient  self care activities - Over the next 90 days, patient will: o Contact provider with any episodes of hypoglycemia  Medication management . Pharmacist Clinical Goal(s): o Over the next 90 days, patient will work with PharmD and providers to maintain optimal medication adherence . [facility] . Interventions o Comprehensive medication review performed. o Continue current medication management strategy . Patient self care activities - Over the next 90 days, patient will: o Focus on medication adherence by pill box o Take medications as prescribed o Report any questions or concerns to PharmD and/or provider(s)  Please see past updates related to this goal by clicking on the "Past Updates" button in the selected goal         The patient verbalized understanding of instructions, educational materials, and care plan provided today and declined offer to receive copy of patient instructions, educational materials, and care plan.   Telephone follow up appointment with pharmacy team member scheduled for: 12/2020  Burnice Logan, [facility]  Constipation, Adult Constipation is when a person:  Poops (has a bowel movement) fewer times in a week than normal.  Has a hard time pooping.  Has poop that is dry, hard, or bigger than normal. Follow these instructions at home: Eating and drinking   Eat foods that have a lot of fiber, such as: ? Fresh fruits and vegetables. ? Whole grains. ? Beans.  Eat less of foods that are high in fat, low in fiber, or  overly processed, such as: ? [place] fries. ? Hamburgers. ? Cookies. ? Candy. ? Soda.  Drink enough fluid to keep your pee (urine) clear or pale yellow. General instructions  Exercise regularly or as told by your doctor.  Go to the restroom when you feel like you need to poop. Do not hold it in.  Take over-the-counter and prescription medicines only as told by your doctor. These include any fiber supplements.  Do pelvic floor  retraining exercises, such as: ? Doing deep breathing while relaxing your lower belly (abdomen). ? Relaxing your pelvic floor while pooping.  Watch your condition for any changes.  Keep all follow-up visits as told by your doctor. This is important. Contact a doctor if:  You have pain that gets worse.  You have a fever.  You have not pooped for 4 days.  You throw up (vomit).  You are not hungry.  You lose weight.  You are bleeding from the anus.  You have thin, pencil-like poop (stool). Get help right away if:  You have a fever, and your symptoms suddenly get worse.  You leak poop or have blood in your poop.  Your belly feels hard or bigger than normal (is bloated).  You have very bad belly pain.  You feel dizzy or you faint. This information is not intended to replace advice given to you by your health care provider. Make sure you discuss any questions you have with your health care provider. Document Revised: 07/21/2017 Document Reviewed: 01/27/2016 Elsevier Patient Education  2020 [employer].

## 2020-08-31 ENCOUNTER — Telehealth: Payer: Medicare HMO

## 2020-08-31 DIAGNOSIS — Z471 Aftercare following joint replacement surgery: Secondary | ICD-10-CM | POA: Diagnosis not present

## 2020-08-31 DIAGNOSIS — Z96651 Presence of right artificial knee joint: Secondary | ICD-10-CM | POA: Diagnosis not present

## 2020-09-01 DIAGNOSIS — L910 Hypertrophic scar: Secondary | ICD-10-CM | POA: Diagnosis not present

## 2020-09-02 DIAGNOSIS — M25562 Pain in left knee: Secondary | ICD-10-CM | POA: Diagnosis not present

## 2020-09-02 DIAGNOSIS — R293 Abnormal posture: Secondary | ICD-10-CM | POA: Diagnosis not present

## 2020-09-02 DIAGNOSIS — M6281 Muscle weakness (generalized): Secondary | ICD-10-CM | POA: Diagnosis not present

## 2020-09-02 DIAGNOSIS — M170 Bilateral primary osteoarthritis of knee: Secondary | ICD-10-CM | POA: Diagnosis not present

## 2020-09-02 DIAGNOSIS — M25662 Stiffness of left knee, not elsewhere classified: Secondary | ICD-10-CM | POA: Diagnosis not present

## 2020-09-02 DIAGNOSIS — M25661 Stiffness of right knee, not elsewhere classified: Secondary | ICD-10-CM | POA: Diagnosis not present

## 2020-09-02 DIAGNOSIS — Z96659 Presence of unspecified artificial knee joint: Secondary | ICD-10-CM | POA: Diagnosis not present

## 2020-09-02 DIAGNOSIS — M25561 Pain in right knee: Secondary | ICD-10-CM | POA: Diagnosis not present

## 2020-09-02 DIAGNOSIS — R2689 Other abnormalities of gait and mobility: Secondary | ICD-10-CM | POA: Diagnosis not present

## 2020-09-04 DIAGNOSIS — R293 Abnormal posture: Secondary | ICD-10-CM | POA: Diagnosis not present

## 2020-09-04 DIAGNOSIS — M25561 Pain in right knee: Secondary | ICD-10-CM | POA: Diagnosis not present

## 2020-09-04 DIAGNOSIS — M25562 Pain in left knee: Secondary | ICD-10-CM | POA: Diagnosis not present

## 2020-09-04 DIAGNOSIS — R2689 Other abnormalities of gait and mobility: Secondary | ICD-10-CM | POA: Diagnosis not present

## 2020-09-04 DIAGNOSIS — M25662 Stiffness of left knee, not elsewhere classified: Secondary | ICD-10-CM | POA: Diagnosis not present

## 2020-09-04 DIAGNOSIS — M6281 Muscle weakness (generalized): Secondary | ICD-10-CM | POA: Diagnosis not present

## 2020-09-04 DIAGNOSIS — Z96659 Presence of unspecified artificial knee joint: Secondary | ICD-10-CM | POA: Diagnosis not present

## 2020-09-04 DIAGNOSIS — M25661 Stiffness of right knee, not elsewhere classified: Secondary | ICD-10-CM | POA: Diagnosis not present

## 2020-09-04 DIAGNOSIS — M170 Bilateral primary osteoarthritis of knee: Secondary | ICD-10-CM | POA: Diagnosis not present

## 2020-09-09 DIAGNOSIS — M25662 Stiffness of left knee, not elsewhere classified: Secondary | ICD-10-CM | POA: Diagnosis not present

## 2020-09-09 DIAGNOSIS — M170 Bilateral primary osteoarthritis of knee: Secondary | ICD-10-CM | POA: Diagnosis not present

## 2020-09-09 DIAGNOSIS — M25562 Pain in left knee: Secondary | ICD-10-CM | POA: Diagnosis not present

## 2020-09-09 DIAGNOSIS — R293 Abnormal posture: Secondary | ICD-10-CM | POA: Diagnosis not present

## 2020-09-09 DIAGNOSIS — M6281 Muscle weakness (generalized): Secondary | ICD-10-CM | POA: Diagnosis not present

## 2020-09-09 DIAGNOSIS — M25661 Stiffness of right knee, not elsewhere classified: Secondary | ICD-10-CM | POA: Diagnosis not present

## 2020-09-09 DIAGNOSIS — M25561 Pain in right knee: Secondary | ICD-10-CM | POA: Diagnosis not present

## 2020-09-09 DIAGNOSIS — Z96659 Presence of unspecified artificial knee joint: Secondary | ICD-10-CM | POA: Diagnosis not present

## 2020-09-09 DIAGNOSIS — R2689 Other abnormalities of gait and mobility: Secondary | ICD-10-CM | POA: Diagnosis not present

## 2020-09-10 ENCOUNTER — Other Ambulatory Visit: Payer: Self-pay

## 2020-09-10 ENCOUNTER — Other Ambulatory Visit: Payer: Medicare HMO

## 2020-09-10 DIAGNOSIS — L4050 Arthropathic psoriasis, unspecified: Secondary | ICD-10-CM

## 2020-09-10 LAB — COMP. METABOLIC PANEL (12)
AST: 25 IU/L (ref 0–40)
Albumin/Globulin Ratio: 2 (ref 1.2–2.2)
Albumin: 4.5 g/dL (ref 3.8–4.8)
Alkaline Phosphatase: 102 IU/L (ref 44–121)
BUN/Creatinine Ratio: 17 (ref 12–28)
BUN: 14 mg/dL (ref 8–27)
Bilirubin Total: 0.3 mg/dL (ref 0.0–1.2)
Calcium: 10.4 mg/dL — ABNORMAL HIGH (ref 8.7–10.3)
Chloride: 101 mmol/L (ref 96–106)
Creatinine, Ser: 0.81 mg/dL (ref 0.57–1.00)
GFR calc Af Amer: 85 mL/min/{1.73_m2} (ref >59)
GFR calc non Af Amer: 74 mL/min/{1.73_m2} (ref >59)
Globulin, Total: 2.2 g/dL (ref 1.5–4.5)
Glucose: 99 mg/dL (ref 65–99)
Potassium: 4.5 mmol/L (ref 3.5–5.2)
Sodium: 137 mmol/L (ref 134–144)
Total Protein: 6.7 g/dL (ref 6.0–8.5)

## 2020-09-12 NOTE — Progress Notes (Signed)
Subjective:  Patient ID: Olivia Cruz, female    DOB: Nov 19, 1950  Age: 70 y.o. MRN: [id number]  Chief Complaint  Patient presents with  . Hyperlipidemia  . Hypertension    HPI Hypothyroidism - synthroid 50 mcg once daily.  Psoriatic arthritis: on MTX. Folate 1000 mcg daily. Hyperlipidemia: ON crestor 10 mg once at night. Eats healthy. Patient is s/p TKR in 06/2020. Having constipation. Taking Senna and miralax. Bowels finally moved. Had to almost to go to ED. Improved.  GERD: Takes pantoprazole for reflux.  Depression: on citalopram. Decreased dose to 10 mg daily, but had to increase it back up to 20 mg  because started having panic attacks.   Current Outpatient Medications on File Prior to Visit  Medication Sig Dispense Refill  . SENNA CO by Combination route.    Marland Kitchen acetaminophen (TYLENOL) 325 MG tablet Take by mouth every 6 (six) hours as needed.     Marland Kitchen albuterol (PROAIR HFA) 108 (90 Base) MCG/ACT inhaler Inhale two puffs every four to six hours as needed for cough or wheeze. 1 Inhaler 1  . budesonide (RHINOCORT AQUA) 32 MCG/ACT nasal spray Place 1 spray into both nostrils daily. Reported on 02/04/2016    . Calcium Carbonate-Vitamin D (CALCIUM + D PO) Take 1,200 mg by mouth daily.    . cholecalciferol (VITAMIN D3) 25 MCG (1000 UNIT) tablet Take 1,000 Units by mouth in the morning, at noon, and at bedtime.    . citalopram (CELEXA) 20 MG tablet Take 1 tablet (20 mg total) by mouth daily. 90 tablet 1  . folic acid (FOLVITE) 1 MG tablet Take 1 mg by mouth daily.     . Magnesium 250 MG TABS Take 250 mg by mouth daily.    . methotrexate (RHEUMATREX) 2.5 MG tablet TAKE 6 TABLETS (15 MG TOTAL) BY MOUTH ONCE A WEEK FOR 30 DAYS.    Marland Kitchen Multiple Vitamin (MULTIVITAMIN) capsule Take 1 capsule by mouth daily.    . Multiple Vitamins-Minerals (HAIR SKIN AND NAILS FORMULA PO) Take by mouth daily.    . nitrofurantoin, macrocrystal-monohydrate, (MACROBID) 100 MG capsule 1 po orally, post coitally 30  capsule 2  . pramipexole (MIRAPEX) 1 MG tablet TAKE 1 TABLET (1 MG) BY ORAL ROUTE DAILY 90 tablet 1  . rosuvastatin (CRESTOR) 10 MG tablet Take 1 tablet (10 mg total) by mouth daily. 90 tablet 0  . valACYclovir (VALTREX) 500 MG tablet daily as needed.      No current facility-administered medications on file prior to visit.   Past Medical History:  Diagnosis Date  . Allergic rhinoconjunctivitis   . Anxiety   . Asthma   . Depression   . Diverticulitis   . GERD (gastroesophageal reflux disease)   . Hypothyroid   . Insomnia   . Laryngopharyngeal reflux (LPR)   . Migraines   . Osteoarthritis   . Psoriatic arthritis ([place])   . Skin cancer   . STD (sexually transmitted disease)    HSV II   Past Surgical History:  Procedure Laterality Date  . BELPHAROPTOSIS REPAIR    . BREAST SURGERY Left    times 2  . COLONOSCOPY  06/03/2013   Moderate predominantly sigmoid diverticulosis. Small interal hemorroids  . FOOT SURGERY Right    Removed bone spur  . GANGLION CYST EXCISION Left    foot  . HEMORRHOID SURGERY    . SKIN CANCER EXCISION    . TOTAL KNEE ARTHROPLASTY Right 07/21/2020  . UPPER GI ENDOSCOPY  03/23/2016  Mild gastritis, gastric polyps bxbenign squamous mucosa with no abnormaility, fundic glad poly in setting of mild chron gastritis.    Family History  Problem Relation Age of Onset  . Breast cancer Sister        lung  . Cancer Maternal Grandmother   . Multiple births Maternal Grandmother   . Multiple births Paternal Grandmother   . Cancer Paternal Grandmother   . Thyroid disease Mother   . Heart failure Mother   . Parkinson's disease Father    Social History   Socioeconomic History  . Marital status: Married    Spouse name: Not on file  . Number of children: Not on file  . Years of education: Not on file  . Highest education level: Not on file  Occupational History  . Not on file  Tobacco Use  . Smoking status: Former Smoker    Quit date: 2000    Years  since quitting: 22.0  . Smokeless tobacco: Never Used  Vaping Use  . Vaping Use: Never used  Substance and Sexual Activity  . Alcohol use: No    Alcohol/week: 0.0 standard drinks  . Drug use: No  . Sexual activity: Yes    Partners: Male    Comment: husband vasectomy  Other Topics Concern  . Not on file  Social History Narrative  . Not on file   Social Determinants of Health   Financial Resource Strain: Not on file  Food Insecurity: No Food Insecurity  . Worried About Charity fundraiser in the Last Year: Never true  . Ran Out of Food in the Last Year: Never true  Transportation Needs: No Transportation Needs  . Lack of Transportation (Medical): No  . Lack of Transportation (Non-Medical): No  Physical Activity: Not on file  Stress: Not on file  Social Connections: Not on file    Review of Systems  Constitutional: Negative for chills, fatigue and fever.  HENT: Negative for congestion, ear pain, rhinorrhea and sore throat.   Respiratory: Negative for cough and shortness of breath.   Cardiovascular: Negative for chest pain.  Gastrointestinal: Positive for constipation (controlled with stool softener). Negative for abdominal pain, diarrhea, nausea and vomiting.  Genitourinary: Negative for dysuria and urgency.  Musculoskeletal: Positive for arthralgias [facility]). Negative for back pain and myalgias.  Neurological: Negative for dizziness, weakness, light-headedness and headaches.  Psychiatric/Behavioral: Negative for dysphoric mood (controlled with medication). The patient is not nervous/anxious.      Objective:  BP 122/60   Pulse 88   Temp (!) 93.7 F (34.3 C)   Ht 5' 1.5" (1.562 m)   Wt 157 lb (71.2 kg)   LMP 08/22/2005   SpO2 96%   BMI 29.18 kg/m   BP/Weight 09/14/2020 06/11/2020 0000000  Systolic BP [id number]  Diastolic BP 60 76 74  Wt. (Lbs) 157 153.6 147  BMI 29.18 28.09 27.33    Physical Exam Vitals reviewed.  Constitutional:       Appearance: Normal appearance.  Cardiovascular:     Rate and Rhythm: Normal rate and regular rhythm.     Heart sounds: Normal heart sounds.  Pulmonary:     Effort: Pulmonary effort is normal.     Breath sounds: Normal breath sounds.  Abdominal:     General: Bowel sounds are normal.  Musculoskeletal:        General: Normal range of motion.     Cervical back: Normal range of motion.  Skin:    General: Skin is  warm.  Neurological:     Mental Status: She is alert.  Psychiatric:        Mood and Affect: Mood normal.        Behavior: Behavior normal.     Lab Results  Component Value Date   WBC 4.9 09/15/2020   HGB 13.0 09/15/2020   HCT 38.3 09/15/2020   PLT 381 09/15/2020   GLUCOSE 108 (H) 09/15/2020   CHOL 199 06/11/2020   TRIG 166 (H) 06/11/2020   HDL 54 06/11/2020   LDLCALC 116 (H) 06/11/2020   ALT 32 03/20/2020   AST 24 09/15/2020   NA 138 09/15/2020   K 4.3 09/15/2020   CL 102 09/15/2020   CREATININE 0.70 09/15/2020   BUN 10 09/15/2020   CO2 23 03/20/2020   TSH 3.990 09/15/2020   HGBA1C 5.7 (H) 12/11/2019      Assessment & Plan:   1. Mixed hyperlipidemia The current medical regimen is effective;  continue present plan and medications. Low fat diet.  - Lipid panel; Future  2. Essential hypertension No medicines required.  - CBC with Differential/Platelet; Future - Comprehensive metabolic panel; Future  3. Prediabetes Low sugar diet.  Increase exercise.  4. Atherosclerosis of aorta ([place]) Continue crestor. Low fat diet .  5. Idiopathic progressive neuropathy Currently on no medicines.   6. Gastroesophageal reflux disease without esophagitis Continue protonix.  7. RLS (restless legs syndrome) Continue mirapex.   8. Secondary hypothyroidism Continue synthroid 50 mcg once daily in am.  9. Mild recurrent major depression (HCC) Continue lexapro 20 mg once daily.  Orders Placed This Encounter  Procedures  . Lipid panel  . CBC with  Differential/Platelet  . Comprehensive metabolic panel     Follow-up: Return in about 6 months (around 03/14/2021).  An After Visit Summary was printed and given to the patient.  Rochel Brome, MD [facility] [phone]

## 2020-09-14 ENCOUNTER — Encounter: Payer: Self-pay | Admitting: Family Medicine

## 2020-09-14 ENCOUNTER — Other Ambulatory Visit: Payer: Self-pay

## 2020-09-14 ENCOUNTER — Ambulatory Visit (INDEPENDENT_AMBULATORY_CARE_PROVIDER_SITE_OTHER): Payer: Medicare HMO | Admitting: Family Medicine

## 2020-09-14 VITALS — BP 122/60 | HR 88 | Temp 93.7°F | Ht 61.5 in | Wt 157.0 lb

## 2020-09-14 DIAGNOSIS — M25662 Stiffness of left knee, not elsewhere classified: Secondary | ICD-10-CM | POA: Diagnosis not present

## 2020-09-14 DIAGNOSIS — E782 Mixed hyperlipidemia: Secondary | ICD-10-CM

## 2020-09-14 DIAGNOSIS — E038 Other specified hypothyroidism: Secondary | ICD-10-CM

## 2020-09-14 DIAGNOSIS — R7303 Prediabetes: Secondary | ICD-10-CM | POA: Diagnosis not present

## 2020-09-14 DIAGNOSIS — K219 Gastro-esophageal reflux disease without esophagitis: Secondary | ICD-10-CM | POA: Diagnosis not present

## 2020-09-14 DIAGNOSIS — R2689 Other abnormalities of gait and mobility: Secondary | ICD-10-CM | POA: Diagnosis not present

## 2020-09-14 DIAGNOSIS — M170 Bilateral primary osteoarthritis of knee: Secondary | ICD-10-CM | POA: Diagnosis not present

## 2020-09-14 DIAGNOSIS — G2581 Restless legs syndrome: Secondary | ICD-10-CM | POA: Diagnosis not present

## 2020-09-14 DIAGNOSIS — M25562 Pain in left knee: Secondary | ICD-10-CM | POA: Diagnosis not present

## 2020-09-14 DIAGNOSIS — Z96659 Presence of unspecified artificial knee joint: Secondary | ICD-10-CM | POA: Diagnosis not present

## 2020-09-14 DIAGNOSIS — M25561 Pain in right knee: Secondary | ICD-10-CM | POA: Diagnosis not present

## 2020-09-14 DIAGNOSIS — M25661 Stiffness of right knee, not elsewhere classified: Secondary | ICD-10-CM | POA: Diagnosis not present

## 2020-09-14 DIAGNOSIS — G603 Idiopathic progressive neuropathy: Secondary | ICD-10-CM

## 2020-09-14 DIAGNOSIS — I10 Essential (primary) hypertension: Secondary | ICD-10-CM

## 2020-09-14 DIAGNOSIS — R293 Abnormal posture: Secondary | ICD-10-CM | POA: Diagnosis not present

## 2020-09-14 DIAGNOSIS — F330 Major depressive disorder, recurrent, mild: Secondary | ICD-10-CM | POA: Diagnosis not present

## 2020-09-14 DIAGNOSIS — I700 Atherosclerosis of aorta: Secondary | ICD-10-CM | POA: Diagnosis not present

## 2020-09-14 DIAGNOSIS — M6281 Muscle weakness (generalized): Secondary | ICD-10-CM | POA: Diagnosis not present

## 2020-09-15 ENCOUNTER — Other Ambulatory Visit: Payer: Medicare HMO

## 2020-09-15 DIAGNOSIS — R61 Generalized hyperhidrosis: Secondary | ICD-10-CM | POA: Diagnosis not present

## 2020-09-15 DIAGNOSIS — I10 Essential (primary) hypertension: Secondary | ICD-10-CM | POA: Diagnosis not present

## 2020-09-15 DIAGNOSIS — L4050 Arthropathic psoriasis, unspecified: Secondary | ICD-10-CM | POA: Diagnosis not present

## 2020-09-16 LAB — CBC WITH DIFFERENTIAL/PLATELET
Basophils Absolute: 0 10*3/uL (ref 0.0–0.2)
Basos: 1 %
EOS (ABSOLUTE): 0.2 10*3/uL (ref 0.0–0.4)
Eos: 5 %
Hematocrit: 38.3 % (ref 34.0–46.6)
Hemoglobin: 13 g/dL (ref 11.1–15.9)
Immature Grans (Abs): 0 10*3/uL (ref 0.0–0.1)
Immature Granulocytes: 0 %
Lymphocytes Absolute: 1.5 10*3/uL (ref 0.7–3.1)
Lymphs: 31 %
MCH: 30 pg (ref 26.6–33.0)
MCHC: 33.9 g/dL (ref 31.5–35.7)
MCV: 88 fL (ref 79–97)
Monocytes Absolute: 0.4 10*3/uL (ref 0.1–0.9)
Monocytes: 8 %
Neutrophils Absolute: 2.7 10*3/uL (ref 1.4–7.0)
Neutrophils: 55 %
Platelets: 381 10*3/uL (ref 150–450)
RBC: 4.34 x10E6/uL (ref 3.77–5.28)
RDW: 13.8 % (ref 11.7–15.4)
WBC: 4.9 10*3/uL (ref 3.4–10.8)

## 2020-09-16 LAB — COMP. METABOLIC PANEL (12)
AST: 24 IU/L (ref 0–40)
Albumin/Globulin Ratio: 1.8 (ref 1.2–2.2)
Albumin: 4.4 g/dL (ref 3.8–4.8)
Alkaline Phosphatase: 96 IU/L (ref 44–121)
BUN/Creatinine Ratio: 14 (ref 12–28)
BUN: 10 mg/dL (ref 8–27)
Bilirubin Total: 0.2 mg/dL (ref 0.0–1.2)
Calcium: 10 mg/dL (ref 8.7–10.3)
Chloride: 102 mmol/L (ref 96–106)
Creatinine, Ser: 0.7 mg/dL (ref 0.57–1.00)
GFR calc Af Amer: 102 mL/min/{1.73_m2} (ref >59)
GFR calc non Af Amer: 88 mL/min/{1.73_m2} (ref >59)
Globulin, Total: 2.4 g/dL (ref 1.5–4.5)
Glucose: 108 mg/dL — ABNORMAL HIGH (ref 65–99)
Potassium: 4.3 mmol/L (ref 3.5–5.2)
Sodium: 138 mmol/L (ref 134–144)
Total Protein: 6.8 g/dL (ref 6.0–8.5)

## 2020-09-16 LAB — TSH: TSH: 3.99 u[IU]/mL (ref 0.450–4.500)

## 2020-09-17 ENCOUNTER — Other Ambulatory Visit: Payer: Self-pay

## 2020-09-17 DIAGNOSIS — M25661 Stiffness of right knee, not elsewhere classified: Secondary | ICD-10-CM | POA: Diagnosis not present

## 2020-09-17 DIAGNOSIS — R2689 Other abnormalities of gait and mobility: Secondary | ICD-10-CM | POA: Diagnosis not present

## 2020-09-17 DIAGNOSIS — M6281 Muscle weakness (generalized): Secondary | ICD-10-CM | POA: Diagnosis not present

## 2020-09-17 DIAGNOSIS — M25562 Pain in left knee: Secondary | ICD-10-CM | POA: Diagnosis not present

## 2020-09-17 DIAGNOSIS — M25662 Stiffness of left knee, not elsewhere classified: Secondary | ICD-10-CM | POA: Diagnosis not present

## 2020-09-17 DIAGNOSIS — R293 Abnormal posture: Secondary | ICD-10-CM | POA: Diagnosis not present

## 2020-09-17 DIAGNOSIS — Z96659 Presence of unspecified artificial knee joint: Secondary | ICD-10-CM | POA: Diagnosis not present

## 2020-09-17 DIAGNOSIS — M170 Bilateral primary osteoarthritis of knee: Secondary | ICD-10-CM | POA: Diagnosis not present

## 2020-09-17 DIAGNOSIS — M25561 Pain in right knee: Secondary | ICD-10-CM | POA: Diagnosis not present

## 2020-09-18 ENCOUNTER — Other Ambulatory Visit: Payer: Self-pay | Admitting: Family Medicine

## 2020-09-18 DIAGNOSIS — M8580 Other specified disorders of bone density and structure, unspecified site: Secondary | ICD-10-CM | POA: Diagnosis not present

## 2020-09-18 DIAGNOSIS — Z79899 Other long term (current) drug therapy: Secondary | ICD-10-CM | POA: Diagnosis not present

## 2020-09-18 DIAGNOSIS — L4050 Arthropathic psoriasis, unspecified: Secondary | ICD-10-CM | POA: Diagnosis not present

## 2020-09-18 DIAGNOSIS — M25562 Pain in left knee: Secondary | ICD-10-CM | POA: Diagnosis not present

## 2020-09-18 DIAGNOSIS — M7732 Calcaneal spur, left foot: Secondary | ICD-10-CM | POA: Diagnosis not present

## 2020-09-18 DIAGNOSIS — M7662 Achilles tendinitis, left leg: Secondary | ICD-10-CM | POA: Diagnosis not present

## 2020-09-18 DIAGNOSIS — M25541 Pain in joints of right hand: Secondary | ICD-10-CM | POA: Diagnosis not present

## 2020-09-18 DIAGNOSIS — M25542 Pain in joints of left hand: Secondary | ICD-10-CM | POA: Diagnosis not present

## 2020-09-19 ENCOUNTER — Encounter: Payer: Self-pay | Admitting: Family Medicine

## 2020-09-21 DIAGNOSIS — Z96659 Presence of unspecified artificial knee joint: Secondary | ICD-10-CM | POA: Diagnosis not present

## 2020-09-21 DIAGNOSIS — M25662 Stiffness of left knee, not elsewhere classified: Secondary | ICD-10-CM | POA: Diagnosis not present

## 2020-09-21 DIAGNOSIS — R293 Abnormal posture: Secondary | ICD-10-CM | POA: Diagnosis not present

## 2020-09-21 DIAGNOSIS — M25561 Pain in right knee: Secondary | ICD-10-CM | POA: Diagnosis not present

## 2020-09-21 DIAGNOSIS — M6281 Muscle weakness (generalized): Secondary | ICD-10-CM | POA: Diagnosis not present

## 2020-09-21 DIAGNOSIS — M25562 Pain in left knee: Secondary | ICD-10-CM | POA: Diagnosis not present

## 2020-09-21 DIAGNOSIS — M170 Bilateral primary osteoarthritis of knee: Secondary | ICD-10-CM | POA: Diagnosis not present

## 2020-09-21 DIAGNOSIS — M25661 Stiffness of right knee, not elsewhere classified: Secondary | ICD-10-CM | POA: Diagnosis not present

## 2020-09-21 DIAGNOSIS — R2689 Other abnormalities of gait and mobility: Secondary | ICD-10-CM | POA: Diagnosis not present

## 2020-09-22 ENCOUNTER — Other Ambulatory Visit: Payer: Self-pay

## 2020-09-22 DIAGNOSIS — R059 Cough, unspecified: Secondary | ICD-10-CM | POA: Diagnosis not present

## 2020-09-22 DIAGNOSIS — M79642 Pain in left hand: Secondary | ICD-10-CM | POA: Diagnosis not present

## 2020-09-22 DIAGNOSIS — R29898 Other symptoms and signs involving the musculoskeletal system: Secondary | ICD-10-CM | POA: Diagnosis not present

## 2020-09-22 DIAGNOSIS — M6281 Muscle weakness (generalized): Secondary | ICD-10-CM | POA: Diagnosis not present

## 2020-09-22 DIAGNOSIS — L4050 Arthropathic psoriasis, unspecified: Secondary | ICD-10-CM | POA: Diagnosis not present

## 2020-09-22 DIAGNOSIS — Z79899 Other long term (current) drug therapy: Secondary | ICD-10-CM | POA: Diagnosis not present

## 2020-09-24 ENCOUNTER — Telehealth: Payer: Self-pay

## 2020-09-24 NOTE — Telephone Encounter (Signed)
Recheck labs in 3 months. Hold crestor. Kc

## 2020-09-24 NOTE — Telephone Encounter (Cosign Needed)
Patient called and left message about stopping her Crestor. She states that she has enough insomnia and pain with her knee that she doesn't want an additional medication that has these as possible side effects She says her heart checked out good at recent emergency room visit. She was hoping to get cholesterol down with diet and lifestyle but recent lipid panel results aren't available at this time.   Please advise.

## 2020-09-25 DIAGNOSIS — M6281 Muscle weakness (generalized): Secondary | ICD-10-CM | POA: Diagnosis not present

## 2020-09-25 DIAGNOSIS — M170 Bilateral primary osteoarthritis of knee: Secondary | ICD-10-CM | POA: Diagnosis not present

## 2020-09-25 DIAGNOSIS — M25562 Pain in left knee: Secondary | ICD-10-CM | POA: Diagnosis not present

## 2020-09-25 DIAGNOSIS — M25561 Pain in right knee: Secondary | ICD-10-CM | POA: Diagnosis not present

## 2020-09-25 DIAGNOSIS — M25662 Stiffness of left knee, not elsewhere classified: Secondary | ICD-10-CM | POA: Diagnosis not present

## 2020-09-25 DIAGNOSIS — Z96659 Presence of unspecified artificial knee joint: Secondary | ICD-10-CM | POA: Diagnosis not present

## 2020-09-25 DIAGNOSIS — R2689 Other abnormalities of gait and mobility: Secondary | ICD-10-CM | POA: Diagnosis not present

## 2020-09-25 DIAGNOSIS — R293 Abnormal posture: Secondary | ICD-10-CM | POA: Diagnosis not present

## 2020-09-25 DIAGNOSIS — M25661 Stiffness of right knee, not elsewhere classified: Secondary | ICD-10-CM | POA: Diagnosis not present

## 2020-09-25 NOTE — Telephone Encounter (Signed)
Attempted to call pt. No answer but was an identifying VM box. Left VM stating we will recheck abs in 3 months and to hold crestor, pt to call clinic if has any questions. Will also send mychart message with this info.

## 2020-09-28 DIAGNOSIS — R2689 Other abnormalities of gait and mobility: Secondary | ICD-10-CM | POA: Diagnosis not present

## 2020-09-28 DIAGNOSIS — M25661 Stiffness of right knee, not elsewhere classified: Secondary | ICD-10-CM | POA: Diagnosis not present

## 2020-09-28 DIAGNOSIS — M25662 Stiffness of left knee, not elsewhere classified: Secondary | ICD-10-CM | POA: Diagnosis not present

## 2020-09-28 DIAGNOSIS — M25562 Pain in left knee: Secondary | ICD-10-CM | POA: Diagnosis not present

## 2020-09-28 DIAGNOSIS — M6281 Muscle weakness (generalized): Secondary | ICD-10-CM | POA: Diagnosis not present

## 2020-09-28 DIAGNOSIS — R293 Abnormal posture: Secondary | ICD-10-CM | POA: Diagnosis not present

## 2020-09-28 DIAGNOSIS — M25561 Pain in right knee: Secondary | ICD-10-CM | POA: Diagnosis not present

## 2020-09-28 DIAGNOSIS — M170 Bilateral primary osteoarthritis of knee: Secondary | ICD-10-CM | POA: Diagnosis not present

## 2020-09-28 DIAGNOSIS — Z96659 Presence of unspecified artificial knee joint: Secondary | ICD-10-CM | POA: Diagnosis not present

## 2020-09-29 ENCOUNTER — Other Ambulatory Visit: Payer: Self-pay

## 2020-09-29 MED ORDER — LEVOTHYROXINE SODIUM 50 MCG PO TABS: 50.0000 ug | ORAL_TABLET | Freq: Every day | ORAL | 1 refills | Status: DC

## 2020-10-01 DIAGNOSIS — M25562 Pain in left knee: Secondary | ICD-10-CM | POA: Diagnosis not present

## 2020-10-01 DIAGNOSIS — Z96659 Presence of unspecified artificial knee joint: Secondary | ICD-10-CM | POA: Diagnosis not present

## 2020-10-01 DIAGNOSIS — M25662 Stiffness of left knee, not elsewhere classified: Secondary | ICD-10-CM | POA: Diagnosis not present

## 2020-10-01 DIAGNOSIS — M170 Bilateral primary osteoarthritis of knee: Secondary | ICD-10-CM | POA: Diagnosis not present

## 2020-10-01 DIAGNOSIS — M25661 Stiffness of right knee, not elsewhere classified: Secondary | ICD-10-CM | POA: Diagnosis not present

## 2020-10-01 DIAGNOSIS — M6281 Muscle weakness (generalized): Secondary | ICD-10-CM | POA: Diagnosis not present

## 2020-10-01 DIAGNOSIS — R293 Abnormal posture: Secondary | ICD-10-CM | POA: Diagnosis not present

## 2020-10-01 DIAGNOSIS — R2689 Other abnormalities of gait and mobility: Secondary | ICD-10-CM | POA: Diagnosis not present

## 2020-10-01 DIAGNOSIS — M25561 Pain in right knee: Secondary | ICD-10-CM | POA: Diagnosis not present

## 2020-10-06 DIAGNOSIS — M6788 Other specified disorders of synovium and tendon, other site: Secondary | ICD-10-CM | POA: Insufficient documentation

## 2020-10-06 HISTORY — DX: Other specified disorders of synovium and tendon, other site: M67.88

## 2020-10-08 DIAGNOSIS — Z96659 Presence of unspecified artificial knee joint: Secondary | ICD-10-CM | POA: Diagnosis not present

## 2020-10-08 DIAGNOSIS — M25561 Pain in right knee: Secondary | ICD-10-CM | POA: Diagnosis not present

## 2020-10-08 DIAGNOSIS — R293 Abnormal posture: Secondary | ICD-10-CM | POA: Diagnosis not present

## 2020-10-08 DIAGNOSIS — M6281 Muscle weakness (generalized): Secondary | ICD-10-CM | POA: Diagnosis not present

## 2020-10-08 DIAGNOSIS — M170 Bilateral primary osteoarthritis of knee: Secondary | ICD-10-CM | POA: Diagnosis not present

## 2020-10-08 DIAGNOSIS — M25662 Stiffness of left knee, not elsewhere classified: Secondary | ICD-10-CM | POA: Diagnosis not present

## 2020-10-08 DIAGNOSIS — M25661 Stiffness of right knee, not elsewhere classified: Secondary | ICD-10-CM | POA: Diagnosis not present

## 2020-10-08 DIAGNOSIS — R2689 Other abnormalities of gait and mobility: Secondary | ICD-10-CM | POA: Diagnosis not present

## 2020-10-08 DIAGNOSIS — M25562 Pain in left knee: Secondary | ICD-10-CM | POA: Diagnosis not present

## 2020-10-12 DIAGNOSIS — M25662 Stiffness of left knee, not elsewhere classified: Secondary | ICD-10-CM | POA: Diagnosis not present

## 2020-10-12 DIAGNOSIS — M25562 Pain in left knee: Secondary | ICD-10-CM | POA: Diagnosis not present

## 2020-10-12 DIAGNOSIS — M25661 Stiffness of right knee, not elsewhere classified: Secondary | ICD-10-CM | POA: Diagnosis not present

## 2020-10-12 DIAGNOSIS — Z96659 Presence of unspecified artificial knee joint: Secondary | ICD-10-CM | POA: Diagnosis not present

## 2020-10-12 DIAGNOSIS — R293 Abnormal posture: Secondary | ICD-10-CM | POA: Diagnosis not present

## 2020-10-12 DIAGNOSIS — M25561 Pain in right knee: Secondary | ICD-10-CM | POA: Diagnosis not present

## 2020-10-12 DIAGNOSIS — M170 Bilateral primary osteoarthritis of knee: Secondary | ICD-10-CM | POA: Diagnosis not present

## 2020-10-12 DIAGNOSIS — R2689 Other abnormalities of gait and mobility: Secondary | ICD-10-CM | POA: Diagnosis not present

## 2020-10-12 DIAGNOSIS — M6281 Muscle weakness (generalized): Secondary | ICD-10-CM | POA: Diagnosis not present

## 2020-10-14 DIAGNOSIS — Z882 Allergy status to sulfonamides status: Secondary | ICD-10-CM | POA: Diagnosis not present

## 2020-10-14 DIAGNOSIS — Z880 Allergy status to penicillin: Secondary | ICD-10-CM | POA: Diagnosis not present

## 2020-10-14 DIAGNOSIS — Z96651 Presence of right artificial knee joint: Secondary | ICD-10-CM | POA: Diagnosis not present

## 2020-10-14 DIAGNOSIS — M25461 Effusion, right knee: Secondary | ICD-10-CM | POA: Diagnosis not present

## 2020-10-14 DIAGNOSIS — R29898 Other symptoms and signs involving the musculoskeletal system: Secondary | ICD-10-CM | POA: Diagnosis not present

## 2020-10-14 DIAGNOSIS — Z888 Allergy status to other drugs, medicaments and biological substances status: Secondary | ICD-10-CM | POA: Diagnosis not present

## 2020-10-14 DIAGNOSIS — Z471 Aftercare following joint replacement surgery: Secondary | ICD-10-CM | POA: Diagnosis not present

## 2020-10-14 DIAGNOSIS — Z885 Allergy status to narcotic agent status: Secondary | ICD-10-CM | POA: Diagnosis not present

## 2020-10-15 DIAGNOSIS — M25561 Pain in right knee: Secondary | ICD-10-CM | POA: Diagnosis not present

## 2020-10-15 DIAGNOSIS — R293 Abnormal posture: Secondary | ICD-10-CM | POA: Diagnosis not present

## 2020-10-15 DIAGNOSIS — Z96659 Presence of unspecified artificial knee joint: Secondary | ICD-10-CM | POA: Diagnosis not present

## 2020-10-15 DIAGNOSIS — M25661 Stiffness of right knee, not elsewhere classified: Secondary | ICD-10-CM | POA: Diagnosis not present

## 2020-10-15 DIAGNOSIS — M170 Bilateral primary osteoarthritis of knee: Secondary | ICD-10-CM | POA: Diagnosis not present

## 2020-10-15 DIAGNOSIS — M25562 Pain in left knee: Secondary | ICD-10-CM | POA: Diagnosis not present

## 2020-10-15 DIAGNOSIS — M25662 Stiffness of left knee, not elsewhere classified: Secondary | ICD-10-CM | POA: Diagnosis not present

## 2020-10-15 DIAGNOSIS — M6281 Muscle weakness (generalized): Secondary | ICD-10-CM | POA: Diagnosis not present

## 2020-10-15 DIAGNOSIS — R2689 Other abnormalities of gait and mobility: Secondary | ICD-10-CM | POA: Diagnosis not present

## 2020-10-19 ENCOUNTER — Other Ambulatory Visit: Payer: Self-pay

## 2020-10-19 DIAGNOSIS — M25662 Stiffness of left knee, not elsewhere classified: Secondary | ICD-10-CM | POA: Diagnosis not present

## 2020-10-19 DIAGNOSIS — R2689 Other abnormalities of gait and mobility: Secondary | ICD-10-CM | POA: Diagnosis not present

## 2020-10-19 DIAGNOSIS — Z96659 Presence of unspecified artificial knee joint: Secondary | ICD-10-CM | POA: Diagnosis not present

## 2020-10-19 DIAGNOSIS — M6281 Muscle weakness (generalized): Secondary | ICD-10-CM | POA: Diagnosis not present

## 2020-10-19 DIAGNOSIS — M170 Bilateral primary osteoarthritis of knee: Secondary | ICD-10-CM | POA: Diagnosis not present

## 2020-10-19 DIAGNOSIS — R293 Abnormal posture: Secondary | ICD-10-CM | POA: Diagnosis not present

## 2020-10-19 DIAGNOSIS — M25561 Pain in right knee: Secondary | ICD-10-CM | POA: Diagnosis not present

## 2020-10-19 DIAGNOSIS — M25562 Pain in left knee: Secondary | ICD-10-CM | POA: Diagnosis not present

## 2020-10-19 DIAGNOSIS — M25661 Stiffness of right knee, not elsewhere classified: Secondary | ICD-10-CM | POA: Diagnosis not present

## 2020-10-19 MED ORDER — PANTOPRAZOLE SODIUM 40 MG PO TBEC: 40.0000 mg | DELAYED_RELEASE_TABLET | Freq: Every day | ORAL | 1 refills | Status: DC

## 2020-10-22 ENCOUNTER — Other Ambulatory Visit: Payer: Self-pay

## 2020-10-22 DIAGNOSIS — M6788 Other specified disorders of synovium and tendon, other site: Secondary | ICD-10-CM | POA: Diagnosis not present

## 2020-10-22 DIAGNOSIS — Z96659 Presence of unspecified artificial knee joint: Secondary | ICD-10-CM | POA: Diagnosis not present

## 2020-10-22 MED ORDER — CITALOPRAM HYDROBROMIDE 20 MG PO TABS: 20.0000 mg | ORAL_TABLET | Freq: Every day | ORAL | 1 refills | Status: DC

## 2020-10-22 MED ORDER — PRAMIPEXOLE DIHYDROCHLORIDE 1 MG PO TABS: ORAL_TABLET | ORAL | 1 refills | Status: DC

## 2020-10-22 NOTE — Telephone Encounter (Signed)
Pt left VM requesting citalopram be sent to pharmacy and they will put it on hold. Please advise.  Olivia Cruz, [place] 10/22/20 8:45 AM

## 2020-10-23 ENCOUNTER — Telehealth: Payer: Self-pay

## 2020-10-23 NOTE — Progress Notes (Signed)
Chronic Care Management Pharmacy Assistant   Name: LYANN HAGSTROM  MRN: [id number] DOB: 1951-01-07  Reason for Encounter: General adherence call  Patient Questions:  1.  Have you seen any other providers since your last visit?  09/24/20- Hold Crestor-PCP 10/14/20-Orthopedics  09/22/20-Neurology    2.  Any changes in your medicines or health?  Crestor on hold, patient had a TKR on right knee 07/21/21    PCP : Rochel Brome, MD  Allergies:   Allergies  Allergen Reactions   Codeine Anaphylaxis and Nausea Only   Ambien [Zolpidem Tartrate] Other (See Comments)    Memory loss per patient   Augmentin [Amoxicillin-Pot Clavulanate] Nausea And Vomiting   Cyproheptadine Hcl Other (See Comments)    Confusion   Dilaudid [Hydromorphone]     hallucinations   Hydrocodone Other (See Comments)    anaphylaxis   Lisinopril    Oxycodone     Anaphylaxis   Prednisone    Sulfa Antibiotics Other (See Comments)    hives   Tramadol     Medications: Outpatient Encounter Medications as of 10/23/2020  Medication Sig   acetaminophen (TYLENOL) 325 MG tablet Take by mouth every 6 (six) hours as needed.    albuterol (PROAIR HFA) 108 (90 Base) MCG/ACT inhaler Inhale two puffs every four to six hours as needed for cough or wheeze.   budesonide (RHINOCORT AQUA) 32 MCG/ACT nasal spray Place 1 spray into both nostrils daily. Reported on 02/04/2016   Calcium Carbonate-Vitamin D (CALCIUM + D PO) Take 1,200 mg by mouth daily.   cholecalciferol (VITAMIN D3) 25 MCG (1000 UNIT) tablet Take 1,000 Units by mouth in the morning, at noon, and at bedtime.   citalopram (CELEXA) 20 MG tablet Take 1 tablet (20 mg total) by mouth daily.   folic acid (FOLVITE) 1 MG tablet Take 1 mg by mouth daily.    levothyroxine (SYNTHROID) 50 MCG tablet Take 1 tablet (50 mcg total) by mouth daily.   Magnesium 250 MG TABS Take 250 mg by mouth daily.   methotrexate (RHEUMATREX) 2.5 MG tablet TAKE 6 TABLETS (15 MG  TOTAL) BY MOUTH ONCE A WEEK FOR 30 DAYS.   Multiple Vitamin (MULTIVITAMIN) capsule Take 1 capsule by mouth daily.   Multiple Vitamins-Minerals (HAIR SKIN AND NAILS FORMULA PO) Take by mouth daily.   nitrofurantoin, macrocrystal-monohydrate, (MACROBID) 100 MG capsule 1 po orally, post coitally   pantoprazole (PROTONIX) 40 MG tablet Take 1 tablet (40 mg total) by mouth daily.   pramipexole (MIRAPEX) 1 MG tablet TAKE 1 TABLET (1 MG) BY ORAL ROUTE DAILY   rosuvastatin (CRESTOR) 10 MG tablet Take 1 tablet (10 mg total) by mouth daily.   SENNA CO by Combination route.   valACYclovir (VALTREX) 500 MG tablet daily as needed.    No facility-administered encounter medications on file as of 10/23/2020.    Current Diagnosis: Patient Active Problem List   Diagnosis Date Noted   Bilateral primary osteoarthritis of knee 04/14/2020   Secondary hypothyroidism 12/22/2019   Essential hypertension 12/22/2019   Prediabetes 12/22/2019   Mixed hyperlipidemia 12/22/2019   RLS (restless legs syndrome) 12/22/2019   Psoriatic arthritis ([place]) 10/28/2019   Idiopathic progressive neuropathy 10/28/2019   Acute cystitis without hematuria 09/29/2019   Laryngopharyngeal reflux (LPR) 09/12/2018   Vocal fold atrophy 09/12/2018   Shortness of breath 07/12/2017   Chest tightness 09/29/2015   Cervical os stenosis 04/09/2015   Postmenopausal atrophic vaginitis 04/09/2015   Patient stated she is doing well.  She is  continuing to recover from her recent knee replacement, she said she graduated from physical therapy yesterday.  She is still having some stiffness, but doctor stated that was normal.  She stated her blood pressures are staying in the 120/70 range.  Patient stated all of her medications are now coming from [facility].  Patient recently went back to [employer] to try and drop some weight, she stated she dis not loose any weight this week.  She is looking forward to getting back to the  [facility] again soon.  She is not having any side effects from her current medications.  Follow-Up:  Pharmacist Review  Donette Larry, [place], [place] Pharmacist Assistant [phone]

## 2020-10-30 DIAGNOSIS — L4050 Arthropathic psoriasis, unspecified: Secondary | ICD-10-CM | POA: Diagnosis not present

## 2020-10-30 DIAGNOSIS — M25562 Pain in left knee: Secondary | ICD-10-CM | POA: Diagnosis not present

## 2020-10-30 DIAGNOSIS — Z79899 Other long term (current) drug therapy: Secondary | ICD-10-CM | POA: Diagnosis not present

## 2020-10-30 DIAGNOSIS — M7662 Achilles tendinitis, left leg: Secondary | ICD-10-CM | POA: Diagnosis not present

## 2020-11-04 DIAGNOSIS — D225 Melanocytic nevi of trunk: Secondary | ICD-10-CM | POA: Diagnosis not present

## 2020-11-04 DIAGNOSIS — D1801 Hemangioma of skin and subcutaneous tissue: Secondary | ICD-10-CM | POA: Diagnosis not present

## 2020-11-04 DIAGNOSIS — L814 Other melanin hyperpigmentation: Secondary | ICD-10-CM | POA: Diagnosis not present

## 2020-11-04 DIAGNOSIS — D2239 Melanocytic nevi of other parts of face: Secondary | ICD-10-CM | POA: Diagnosis not present

## 2020-11-05 NOTE — Progress Notes (Signed)
Subjective:  Patient ID: Olivia Cruz, female    DOB: 08/08/1951  Age: 70 y.o. MRN: [id number]  Chief Complaint  Patient presents with  . Discuss Medications    Depression and Cholesterol    HPI Depression-Taking celexa however wants to wean off and try a different medication due to weight gain.  Cholesterol- has not been taking crestor. She was supposed to get her lipids done in 08/2020, but it was not done because she was early and when she came back a week later it was not done. She would like to wait until her next fasting visit.   Right knee pain much better. Completed physical therapy postoperatively.  Having left ankle pain. Seeing Dr Gretta Arab.   Current Outpatient Medications on File Prior to Visit  Medication Sig Dispense Refill  . gabapentin (NEURONTIN) 300 MG capsule Take 300 mg by mouth in the morning, at noon, and at bedtime.    . meloxicam (MOBIC) 15 MG tablet Take 1 tablet by mouth daily.    Marland Kitchen acetaminophen (TYLENOL) 325 MG tablet Take 500 mg by mouth every 6 (six) hours as needed.    Marland Kitchen albuterol (PROAIR HFA) 108 (90 Base) MCG/ACT inhaler Inhale two puffs every four to six hours as needed for cough or wheeze. 1 Inhaler 1  . budesonide (RHINOCORT AQUA) 32 MCG/ACT nasal spray Place 1 spray into both nostrils daily. Reported on 02/04/2016    . Calcium Carbonate-Vitamin D (CALCIUM + D PO) Take 1,200 mg by mouth daily.    . cholecalciferol (VITAMIN D3) 25 MCG (1000 UNIT) tablet Take 1,000 Units by mouth in the morning, at noon, and at bedtime.    . folic acid (FOLVITE) 1 MG tablet Take 1 mg by mouth daily.     Marland Kitchen levothyroxine (SYNTHROID) 50 MCG tablet Take 1 tablet (50 mcg total) by mouth daily. 90 tablet 1  . Magnesium 250 MG TABS Take 250 mg by mouth daily.    . methotrexate (RHEUMATREX) 2.5 MG tablet Take 20 mg by mouth once a week. Takes 8 tablets (20 mg) once a week    . Multiple Vitamin (MULTIVITAMIN) capsule Take 1 capsule by mouth daily.    . Multiple  Vitamins-Minerals (HAIR SKIN AND NAILS FORMULA PO) Take by mouth daily.    . nitrofurantoin, macrocrystal-monohydrate, (MACROBID) 100 MG capsule 1 po orally, post coitally 30 capsule 2  . pantoprazole (PROTONIX) 40 MG tablet Take 1 tablet (40 mg total) by mouth daily. 90 tablet 1  . pramipexole (MIRAPEX) 1 MG tablet TAKE 1 TABLET (1 MG) BY ORAL ROUTE DAILY 90 tablet 1  . SENNA CO by Combination route.    . valACYclovir (VALTREX) 500 MG tablet daily as needed.      No current facility-administered medications on file prior to visit.   Past Medical History:  Diagnosis Date  . Allergic rhinoconjunctivitis   . Anxiety   . Asthma   . Depression   . Diverticulitis   . GERD (gastroesophageal reflux disease)   . Hypothyroid   . Insomnia   . Laryngopharyngeal reflux (LPR)   . Migraines   . Osteoarthritis   . Psoriatic arthritis ([place])   . Skin cancer   . STD (sexually transmitted disease)    HSV II   Past Surgical History:  Procedure Laterality Date  . BELPHAROPTOSIS REPAIR    . BREAST SURGERY Left    times 2  . COLONOSCOPY  06/03/2013   Moderate predominantly sigmoid diverticulosis. Small interal hemorroids  .  FOOT SURGERY Right    Removed bone spur  . GANGLION CYST EXCISION Left    foot  . HEMORRHOID SURGERY    . SKIN CANCER EXCISION    . TOTAL KNEE ARTHROPLASTY Right 07/21/2020  . UPPER GI ENDOSCOPY  03/23/2016   Mild gastritis, gastric polyps bxbenign squamous mucosa with no abnormaility, fundic glad poly in setting of mild chron gastritis.    Family History  Problem Relation Age of Onset  . Breast cancer Sister        lung  . Cancer Maternal Grandmother   . Multiple births Maternal Grandmother   . Multiple births Paternal Grandmother   . Cancer Paternal Grandmother   . Thyroid disease Mother   . Heart failure Mother   . Parkinson's disease Father    Social History   Socioeconomic History  . Marital status: Married    Spouse name: Not on file  . Number of  children: Not on file  . Years of education: Not on file  . Highest education level: Not on file  Occupational History  . Not on file  Tobacco Use  . Smoking status: Former Smoker    Quit date: 2000    Years since quitting: 22.2  . Smokeless tobacco: Never Used  Vaping Use  . Vaping Use: Never used  Substance and Sexual Activity  . Alcohol use: No    Alcohol/week: 0.0 standard drinks  . Drug use: No  . Sexual activity: Yes    Partners: Male    Comment: husband vasectomy  Other Topics Concern  . Not on file  Social History Narrative  . Not on file   Social Determinants of Health   Financial Resource Strain: Not on file  Food Insecurity: No Food Insecurity  . Worried About Charity fundraiser in the Last Year: Never true  . Ran Out of Food in the Last Year: Never true  Transportation Needs: No Transportation Needs  . Lack of Transportation (Medical): No  . Lack of Transportation (Non-Medical): No  Physical Activity: Not on file  Stress: Not on file  Social Connections: Not on file    Review of Systems  Constitutional: Negative for chills, fatigue and fever.  HENT: Negative for congestion, ear pain, postnasal drip, rhinorrhea, sinus pressure, sinus pain and sore throat.   Respiratory: Negative for cough and shortness of breath.   Cardiovascular: Negative for chest pain.     Objective:  BP 124/76   Pulse 74   Temp (!) 97.2 F (36.2 C)   Resp 16   Ht 5\' 2"  (1.575 m)   Wt 158 lb (71.7 kg)   LMP 08/22/2005   BMI 28.90 kg/m   BP/Weight 11/09/2020 09/14/2020 82/95/6213  Systolic BP 086 578 469  Diastolic BP 76 60 76  Wt. (Lbs) 158 157 153.6  BMI 28.9 29.18 28.09    Physical Exam Vitals reviewed.  Constitutional:      Appearance: Normal appearance. She is normal weight.  Cardiovascular:     Rate and Rhythm: Normal rate and regular rhythm.     Pulses: Normal pulses.     Heart sounds: Normal heart sounds.  Pulmonary:     Effort: No respiratory distress.      Breath sounds: Normal breath sounds.  Neurological:     Mental Status: She is alert and oriented to person, place, and time.  Psychiatric:        Mood and Affect: Mood normal.        Behavior:  Behavior normal.     Lab Results  Component Value Date   WBC 4.9 09/15/2020   HGB 13.0 09/15/2020   HCT 38.3 09/15/2020   PLT 381 09/15/2020   GLUCOSE 108 (H) 09/15/2020   CHOL 199 06/11/2020   TRIG 166 (H) 06/11/2020   HDL 54 06/11/2020   LDLCALC 116 (H) 06/11/2020   ALT 32 03/20/2020   AST 24 09/15/2020   NA 138 09/15/2020   K 4.3 09/15/2020   CL 102 09/15/2020   CREATININE 0.70 09/15/2020   BUN 10 09/15/2020   CO2 23 03/20/2020   TSH 3.990 09/15/2020   HGBA1C 5.7 (H) 12/11/2019      Assessment & Plan:   1. Mixed hyperlipidemia Not at goal. Not taking crestor.  Recheck lipid panel at next fasting visit  2. Mild recurrent major depression ([place]) Start on effexor xr 75 mg once in am.  Stop citalopram.  Call in 3 weeks to update [place] on how the medicine is working.   3. Weight gain  see above  Meds ordered this encounter  Medications  . venlafaxine XR (EFFEXOR XR) 75 MG 24 hr capsule    Sig: Take 1 capsule (75 mg total) by mouth daily with breakfast.    Dispense:  30 capsule    Refill:  1    Follow-up: Return in about 5 weeks (around 12/14/2020) for fasting visit.  An After Visit Summary was printed and given to the patient.  Rochel Brome, MD [facility] [phone]

## 2020-11-09 ENCOUNTER — Other Ambulatory Visit: Payer: Self-pay

## 2020-11-09 ENCOUNTER — Ambulatory Visit (INDEPENDENT_AMBULATORY_CARE_PROVIDER_SITE_OTHER): Payer: Medicare HMO | Admitting: Family Medicine

## 2020-11-09 VITALS — BP 124/76 | HR 74 | Temp 97.2°F | Resp 16 | Ht 62.0 in | Wt 158.0 lb

## 2020-11-09 DIAGNOSIS — E782 Mixed hyperlipidemia: Secondary | ICD-10-CM

## 2020-11-09 DIAGNOSIS — R635 Abnormal weight gain: Secondary | ICD-10-CM

## 2020-11-09 DIAGNOSIS — F330 Major depressive disorder, recurrent, mild: Secondary | ICD-10-CM

## 2020-11-09 MED ORDER — VENLAFAXINE HCL ER 75 MG PO CP24: 75.0000 mg | ORAL_CAPSULE | Freq: Every day | ORAL | 1 refills | Status: DC

## 2020-11-09 NOTE — Patient Instructions (Addendum)
Start on effexor xr 75 mg once in am.  Stop citalopram.  Call in 3 weeks to update [place] on how the medicine is working.

## 2020-11-10 DIAGNOSIS — M6788 Other specified disorders of synovium and tendon, other site: Secondary | ICD-10-CM | POA: Diagnosis not present

## 2020-11-10 DIAGNOSIS — M6702 Short Achilles tendon (acquired), left ankle: Secondary | ICD-10-CM | POA: Insufficient documentation

## 2020-11-10 HISTORY — DX: Short Achilles tendon (acquired), left ankle: M67.02

## 2020-11-11 ENCOUNTER — Telehealth: Payer: Self-pay

## 2020-11-11 NOTE — Progress Notes (Signed)
    Chronic Care Management Pharmacy Assistant   Name: Olivia Cruz  MRN: [id number] DOB: 1951/04/27   Reason for Encounter: Adherence review    Medications: Outpatient Encounter Medications as of 11/11/2020  Medication Sig  . acetaminophen (TYLENOL) 325 MG tablet Take 500 mg by mouth every 6 (six) hours as needed.  Marland Kitchen albuterol (PROAIR HFA) 108 (90 Base) MCG/ACT inhaler Inhale two puffs every four to six hours as needed for cough or wheeze.  . budesonide (RHINOCORT AQUA) 32 MCG/ACT nasal spray Place 1 spray into both nostrils daily. Reported on 02/04/2016  . Calcium Carbonate-Vitamin D (CALCIUM + D PO) Take 1,200 mg by mouth daily.  . cholecalciferol (VITAMIN D3) 25 MCG (1000 UNIT) tablet Take 1,000 Units by mouth in the morning, at noon, and at bedtime.  . folic acid (FOLVITE) 1 MG tablet Take 1 mg by mouth daily.   Marland Kitchen gabapentin (NEURONTIN) 300 MG capsule Take 300 mg by mouth in the morning, at noon, and at bedtime.  Marland Kitchen levothyroxine (SYNTHROID) 50 MCG tablet Take 1 tablet (50 mcg total) by mouth daily.  . Magnesium 250 MG TABS Take 250 mg by mouth daily.  . meloxicam (MOBIC) 15 MG tablet Take 1 tablet by mouth daily.  . methotrexate (RHEUMATREX) 2.5 MG tablet Take 20 mg by mouth once a week. Takes 8 tablets (20 mg) once a week  . Multiple Vitamin (MULTIVITAMIN) capsule Take 1 capsule by mouth daily.  . Multiple Vitamins-Minerals (HAIR SKIN AND NAILS FORMULA PO) Take by mouth daily.  . nitrofurantoin, macrocrystal-monohydrate, (MACROBID) 100 MG capsule 1 po orally, post coitally  . pantoprazole (PROTONIX) 40 MG tablet Take 1 tablet (40 mg total) by mouth daily.  . pramipexole (MIRAPEX) 1 MG tablet TAKE 1 TABLET (1 MG) BY ORAL ROUTE DAILY  . SENNA CO by Combination route.  . valACYclovir (VALTREX) 500 MG tablet daily as needed.   . venlafaxine XR (EFFEXOR XR) 75 MG 24 hr capsule Take 1 capsule (75 mg total) by mouth daily with breakfast.   No facility-administered encounter  medications on file as of 11/11/2020.    Patients next appointments are: Dr. Tobie Poet for annual wellness-12/02/20 Donette Larry, CPP-12/25/20  Adherence gaps Total Gaps - All Measures 2  Total Gaps - Star Measures 1 ? Breast Cancer Screening (BCS) 0 ? Diabetes: HbA1c Control <= 9.0% (CDC-A1c) 0 ? Diabetes: Eye exam (retinal) performed (CDC-Eye) 0 ? Diabetes: Medical attention for nephropathy (CDC-Neph) 0 ? Colorectal Cancer Screening (COL) 0 ? Medication Adherence for Cholesterol (Statins) (MAC) 0 ? Medication Adherence for Diabetes Medications (MAD) 0 ? Medication Adherence for Hypertension (RAS antogoniststs) [facility]) [place] ? Statin Therapy for Patients with Cardiovascular Disease (SPC) 0 ? Statin Use in Persons with Diabetes (SUPD) 0 Visits: Wellness Bundle (THN) 0 Visits: Annual Wellness Visit (AWV) [place] Controlling High Blood Pressure (CBP) 0 Diabetes: HbA1c Testing (CDC-A1c Testing) 0  Clarita Leber, [place] Pharmacist Assistant [phone]

## 2020-11-17 DIAGNOSIS — Z79899 Other long term (current) drug therapy: Secondary | ICD-10-CM | POA: Diagnosis not present

## 2020-11-18 DIAGNOSIS — R293 Abnormal posture: Secondary | ICD-10-CM | POA: Diagnosis not present

## 2020-11-18 DIAGNOSIS — M25662 Stiffness of left knee, not elsewhere classified: Secondary | ICD-10-CM | POA: Diagnosis not present

## 2020-11-18 DIAGNOSIS — M25672 Stiffness of left ankle, not elsewhere classified: Secondary | ICD-10-CM | POA: Diagnosis not present

## 2020-11-18 DIAGNOSIS — M25572 Pain in left ankle and joints of left foot: Secondary | ICD-10-CM | POA: Diagnosis not present

## 2020-11-18 DIAGNOSIS — M6788 Other specified disorders of synovium and tendon, other site: Secondary | ICD-10-CM | POA: Diagnosis not present

## 2020-11-18 DIAGNOSIS — M6281 Muscle weakness (generalized): Secondary | ICD-10-CM | POA: Diagnosis not present

## 2020-11-18 DIAGNOSIS — M25661 Stiffness of right knee, not elsewhere classified: Secondary | ICD-10-CM | POA: Diagnosis not present

## 2020-11-22 ENCOUNTER — Encounter: Payer: Self-pay | Admitting: Family Medicine

## 2020-11-24 DIAGNOSIS — M25662 Stiffness of left knee, not elsewhere classified: Secondary | ICD-10-CM | POA: Diagnosis not present

## 2020-11-24 DIAGNOSIS — M25661 Stiffness of right knee, not elsewhere classified: Secondary | ICD-10-CM | POA: Diagnosis not present

## 2020-11-24 DIAGNOSIS — M25672 Stiffness of left ankle, not elsewhere classified: Secondary | ICD-10-CM | POA: Diagnosis not present

## 2020-11-24 DIAGNOSIS — M25572 Pain in left ankle and joints of left foot: Secondary | ICD-10-CM | POA: Diagnosis not present

## 2020-11-24 DIAGNOSIS — M6788 Other specified disorders of synovium and tendon, other site: Secondary | ICD-10-CM | POA: Diagnosis not present

## 2020-11-24 DIAGNOSIS — R293 Abnormal posture: Secondary | ICD-10-CM | POA: Diagnosis not present

## 2020-11-24 DIAGNOSIS — M6281 Muscle weakness (generalized): Secondary | ICD-10-CM | POA: Diagnosis not present

## 2020-11-26 DIAGNOSIS — M25662 Stiffness of left knee, not elsewhere classified: Secondary | ICD-10-CM | POA: Diagnosis not present

## 2020-11-26 DIAGNOSIS — M25661 Stiffness of right knee, not elsewhere classified: Secondary | ICD-10-CM | POA: Diagnosis not present

## 2020-11-26 DIAGNOSIS — M6788 Other specified disorders of synovium and tendon, other site: Secondary | ICD-10-CM | POA: Diagnosis not present

## 2020-11-26 DIAGNOSIS — R293 Abnormal posture: Secondary | ICD-10-CM | POA: Diagnosis not present

## 2020-11-26 DIAGNOSIS — R29898 Other symptoms and signs involving the musculoskeletal system: Secondary | ICD-10-CM | POA: Diagnosis not present

## 2020-11-26 DIAGNOSIS — M8580 Other specified disorders of bone density and structure, unspecified site: Secondary | ICD-10-CM | POA: Diagnosis not present

## 2020-11-26 DIAGNOSIS — M25842 Other specified joint disorders, left hand: Secondary | ICD-10-CM | POA: Diagnosis not present

## 2020-11-26 DIAGNOSIS — Z79899 Other long term (current) drug therapy: Secondary | ICD-10-CM | POA: Diagnosis not present

## 2020-11-26 DIAGNOSIS — R2231 Localized swelling, mass and lump, right upper limb: Secondary | ICD-10-CM | POA: Diagnosis not present

## 2020-11-26 DIAGNOSIS — R053 Chronic cough: Secondary | ICD-10-CM | POA: Diagnosis not present

## 2020-11-26 DIAGNOSIS — L4050 Arthropathic psoriasis, unspecified: Secondary | ICD-10-CM | POA: Diagnosis not present

## 2020-11-26 DIAGNOSIS — R197 Diarrhea, unspecified: Secondary | ICD-10-CM | POA: Diagnosis not present

## 2020-11-26 DIAGNOSIS — M6281 Muscle weakness (generalized): Secondary | ICD-10-CM | POA: Diagnosis not present

## 2020-11-26 DIAGNOSIS — M25672 Stiffness of left ankle, not elsewhere classified: Secondary | ICD-10-CM | POA: Diagnosis not present

## 2020-11-26 DIAGNOSIS — M25572 Pain in left ankle and joints of left foot: Secondary | ICD-10-CM | POA: Diagnosis not present

## 2020-12-01 DIAGNOSIS — M1812 Unilateral primary osteoarthritis of first carpometacarpal joint, left hand: Secondary | ICD-10-CM | POA: Diagnosis not present

## 2020-12-01 DIAGNOSIS — M85842 Other specified disorders of bone density and structure, left hand: Secondary | ICD-10-CM | POA: Diagnosis not present

## 2020-12-01 DIAGNOSIS — M62542 Muscle wasting and atrophy, not elsewhere classified, left hand: Secondary | ICD-10-CM | POA: Insufficient documentation

## 2020-12-01 DIAGNOSIS — M79642 Pain in left hand: Secondary | ICD-10-CM | POA: Diagnosis not present

## 2020-12-01 DIAGNOSIS — R2231 Localized swelling, mass and lump, right upper limb: Secondary | ICD-10-CM

## 2020-12-01 DIAGNOSIS — L4050 Arthropathic psoriasis, unspecified: Secondary | ICD-10-CM | POA: Diagnosis not present

## 2020-12-01 DIAGNOSIS — M67441 Ganglion, right hand: Secondary | ICD-10-CM | POA: Diagnosis not present

## 2020-12-01 DIAGNOSIS — M19042 Primary osteoarthritis, left hand: Secondary | ICD-10-CM | POA: Diagnosis not present

## 2020-12-01 HISTORY — DX: Muscle wasting and atrophy, not elsewhere classified, left hand: M62.542

## 2020-12-01 HISTORY — DX: Localized swelling, mass and lump, right upper limb: R22.31

## 2020-12-02 ENCOUNTER — Other Ambulatory Visit: Payer: Self-pay

## 2020-12-02 ENCOUNTER — Other Ambulatory Visit: Payer: Self-pay | Admitting: Family Medicine

## 2020-12-02 ENCOUNTER — Ambulatory Visit (INDEPENDENT_AMBULATORY_CARE_PROVIDER_SITE_OTHER): Payer: Medicare HMO

## 2020-12-02 VITALS — BP 134/78 | HR 82 | Resp 16 | Ht 62.0 in | Wt 157.6 lb

## 2020-12-02 DIAGNOSIS — M25661 Stiffness of right knee, not elsewhere classified: Secondary | ICD-10-CM | POA: Diagnosis not present

## 2020-12-02 DIAGNOSIS — R293 Abnormal posture: Secondary | ICD-10-CM | POA: Diagnosis not present

## 2020-12-02 DIAGNOSIS — M6788 Other specified disorders of synovium and tendon, other site: Secondary | ICD-10-CM | POA: Diagnosis not present

## 2020-12-02 DIAGNOSIS — Z7952 Long term (current) use of systemic steroids: Secondary | ICD-10-CM | POA: Diagnosis not present

## 2020-12-02 DIAGNOSIS — Z9189 Other specified personal risk factors, not elsewhere classified: Secondary | ICD-10-CM

## 2020-12-02 DIAGNOSIS — N959 Unspecified menopausal and perimenopausal disorder: Secondary | ICD-10-CM | POA: Diagnosis not present

## 2020-12-02 DIAGNOSIS — M25662 Stiffness of left knee, not elsewhere classified: Secondary | ICD-10-CM | POA: Diagnosis not present

## 2020-12-02 DIAGNOSIS — Z0000 Encounter for general adult medical examination without abnormal findings: Secondary | ICD-10-CM | POA: Diagnosis not present

## 2020-12-02 DIAGNOSIS — M6281 Muscle weakness (generalized): Secondary | ICD-10-CM | POA: Diagnosis not present

## 2020-12-02 DIAGNOSIS — M25672 Stiffness of left ankle, not elsewhere classified: Secondary | ICD-10-CM | POA: Diagnosis not present

## 2020-12-02 DIAGNOSIS — M25572 Pain in left ankle and joints of left foot: Secondary | ICD-10-CM | POA: Diagnosis not present

## 2020-12-02 NOTE — Patient Instructions (Signed)
Bone Density Test A bone density test uses a type of X-ray to measure the amount of calcium and other minerals in a person's bones. It can measure bone density in the hip and the spine. The test is similar to having a regular X-ray. This test may also be called:  Bone densitometry.  Bone mineral density test.  Dual-energy X-ray absorptiometry (DEXA). You may have this test to:  Diagnose a condition that causes weak or thin bones (osteoporosis).  Screen you for osteoporosis.  Predict your risk for a broken bone (fracture).  Determine how well your osteoporosis treatment is working. Tell a health care provider about:  Any allergies you have.  All medicines you are taking, including vitamins, herbs, eye drops, creams, and over-the-counter medicines.  Any problems you or family members have had with anesthetic medicines.  Any blood disorders you have.  Any surgeries you have had.  Any medical conditions you have.  Whether you are pregnant or may be pregnant.  Any medical tests you have had within the past 14 days that used contrast material. What are the risks? Generally, this is a safe test. However, it does expose you to a small amount of radiation, which can slightly increase your cancer risk. What happens before the test?  Do not take any calcium supplements within the 24 hours before your test.  You will need to remove all metal jewelry, eyeglasses, removable dental appliances, and any other metal objects on your body. What happens during the test?  You will lie down on an exam table. There will be an X-ray generator below you and an imaging device above you.  Other devices, such as boxes or braces, may be used to position your body properly for the scan.  The machine will slowly scan your body. You will need to keep very still while the machine does the scan.  The images will show up on a screen in the room. Images will be examined by a specialist after your  test is finished. The procedure may vary among health care providers and hospitals.   What can I expect after the test? It is up to you to get the results of your test. Ask your health care provider, or the department that is doing the test, when your results will be ready. Summary  A bone density test is an imaging test that uses a type of X-ray to measure the amount of calcium and other minerals in your bones.  The test may be used to diagnose or screen you for a condition that causes weak or thin bones (osteoporosis), predict your risk for a broken bone (fracture), or determine how well your osteoporosis treatment is working.  Do not take any calcium supplements within 24 hours before your test.  Ask your health care provider, or the department that is doing the test, when your results will be ready. This information is not intended to replace advice given to you by your health care provider. Make sure you discuss any questions you have with your health care provider. Document Revised: 01/23/2020 Document Reviewed: 01/23/2020 Elsevier Patient Education  2021 [place] Prevention in the Home, Adult Falls can cause injuries and can happen to people of all ages. There are many things you can do to make your home safe and to help prevent falls. Ask for help when making these changes. What actions can I take to prevent falls? General Instructions  Use good lighting in all rooms. Replace  any light bulbs that burn out.  Turn on the lights in dark areas. Use night-lights.  Keep items that you use often in easy-to-reach places. Lower the shelves around your home if needed.  Set up your furniture so you have a clear path. Avoid moving your furniture around.  Do not have throw rugs or other things on the floor that can make you trip.  Avoid walking on wet floors.  If any of your floors are uneven, fix them.  Add color or contrast paint or tape to clearly mark and help you  see: ? Grab bars or handrails. ? First and last steps of staircases. ? Where the edge of each step is.  If you use a stepladder: ? Make sure that it is fully opened. Do not climb a closed stepladder. ? Make sure the sides of the stepladder are locked in place. ? Ask someone to hold the stepladder while you use it.  Know where your pets are when moving through your home. What can I do in the bathroom?  Keep the floor dry. Clean up any water on the floor right away.  Remove soap buildup in the tub or shower.  Use nonskid mats or decals on the floor of the tub or shower.  Attach bath mats securely with double-sided, nonslip rug tape.  If you need to sit down in the shower, use a plastic, nonslip stool.  Install grab bars by the toilet and in the tub and shower. Do not use towel bars as grab bars.      What can I do in the bedroom?  Make sure that you have a light by your bed that is easy to reach.  Do not use any sheets or blankets for your bed that hang to the floor.  Have a firm chair with side arms that you can use for support when you get dressed. What can I do in the kitchen?  Clean up any spills right away.  If you need to reach something above you, use a step stool with a grab bar.  Keep electrical cords out of the way.  Do not use floor polish or wax that makes floors slippery. What can I do with my stairs?  Do not leave any items on the stairs.  Make sure that you have a light switch at the top and the bottom of the stairs.  Make sure that there are handrails on both sides of the stairs. Fix handrails that are broken or loose.  Install nonslip stair treads on all your stairs.  Avoid having throw rugs at the top or bottom of the stairs.  Choose a carpet that does not hide the edge of the steps on the stairs.  Check carpeting to make sure that it is firmly attached to the stairs. Fix carpet that is loose or worn. What can I do on the outside of my  home?  Use bright outdoor lighting.  Fix the edges of walkways and driveways and fix any cracks.  Remove anything that might make you trip as you walk through a door, such as a raised step or threshold.  Trim any bushes or trees on paths to your home.  Check to see if handrails are loose or broken and that both sides of all steps have handrails.  Install guardrails along the edges of any raised decks and porches.  Clear paths of anything that can make you trip, such as tools or rocks.  Have leaves, snow, or  ice cleared regularly.  Use sand or salt on paths during winter.  Clean up any spills in your garage right away. This includes grease or oil spills. What other actions can I take?  Wear shoes that: ? Have a low heel. Do not wear high heels. ? Have rubber bottoms. ? Feel good on your feet and fit well. ? Are closed at the toe. Do not wear open-toe sandals.  Use tools that help you move around if needed. These include: ? Canes. ? Walkers. ? Scooters. ? Crutches.  Review your medicines with your doctor. Some medicines can make you feel dizzy. This can increase your chance of falling. Ask your doctor what else you can do to help prevent falls. Where to find more information  Centers for Disease Control and Prevention, [employer]: http://www.wolf.info/  National Institute on Aging: http://kim-miller.com/ Contact a doctor if:  You are afraid of falling at home.  You feel weak, drowsy, or dizzy at home.  You fall at home. Summary  There are many simple things that you can do to make your home safe and to help prevent falls.  Ways to make your home safe include removing things that can make you trip and installing grab bars in the bathroom.  Ask for help when making these changes in your home. This information is not intended to replace advice given to you by your health care provider. Make sure you discuss any questions you have with your health care provider. Document Revised:  03/11/2020 Document Reviewed: 03/11/2020 Elsevier Patient Education  [place] Maintenance, Female Adopting a healthy lifestyle and getting preventive care are important in promoting health and wellness. Ask your health care provider about:  The right schedule for you to have regular tests and exams.  Things you can do on your own to prevent diseases and keep yourself healthy. What should I know about diet, weight, and exercise? Eat a healthy diet  Eat a diet that includes plenty of vegetables, fruits, low-fat dairy products, and lean protein.  Do not eat a lot of foods that are high in solid fats, added sugars, or sodium.   Maintain a healthy weight Body mass index (BMI) is used to identify weight problems. It estimates body fat based on height and weight. Your health care provider can help determine your BMI and help you achieve or maintain a healthy weight. Get regular exercise Get regular exercise. This is one of the most important things you can do for your health. Most adults should:  Exercise for at least 150 minutes each week. The exercise should increase your heart rate and make you sweat (moderate-intensity exercise).  Do strengthening exercises at least twice a week. This is in addition to the moderate-intensity exercise.  Spend less time sitting. Even light physical activity can be beneficial. Watch cholesterol and blood lipids Have your blood tested for lipids and cholesterol at 71 years of age, then have this test every 5 years. Have your cholesterol levels checked more often if:  Your lipid or cholesterol levels are high.  You are older than 71 years of age.  You are at high risk for heart disease. What should I know about cancer screening? Depending on your health history and family history, you may need to have cancer screening at various ages. This may include screening for:  Breast cancer.  Cervical cancer.  Colorectal cancer.  Skin  cancer.  Lung cancer. What should I know about heart disease, diabetes, and high  blood pressure? Blood pressure and heart disease  High blood pressure causes heart disease and increases the risk of stroke. This is more likely to develop in people who have high blood pressure readings, are of African descent, or are overweight.  Have your blood pressure checked: ? Every 3-5 years if you are 91-69 years of age. ? Every year if you are 74 years old or older. Diabetes Have regular diabetes screenings. This checks your fasting blood sugar level. Have the screening done:  Once every three years after age 77 if you are at a normal weight and have a low risk for diabetes.  More often and at a younger age if you are overweight or have a high risk for diabetes. What should I know about preventing infection? Hepatitis B If you have a higher risk for hepatitis B, you should be screened for this virus. Talk with your health care provider to find out if you are at risk for hepatitis B infection. Hepatitis C Testing is recommended for:  Everyone born from [birth year] through 1966.  Anyone with known risk factors for hepatitis C. Sexually transmitted infections (STIs)  Get screened for STIs, including gonorrhea and chlamydia, if: ? You are sexually active and are younger than 71 years of age. ? You are older than 71 years of age and your health care provider tells you that you are at risk for this type of infection. ? Your sexual activity has changed since you were last screened, and you are at increased risk for chlamydia or gonorrhea. Ask your health care provider if you are at risk.  Ask your health care provider about whether you are at high risk for HIV. Your health care provider may recommend a prescription medicine to help prevent HIV infection. If you choose to take medicine to prevent HIV, you should first get tested for HIV. You should then be tested every 3 months for as long as you are taking  the medicine. Pregnancy  If you are about to stop having your period (premenopausal) and you may become pregnant, seek counseling before you get pregnant.  Take 400 to 800 micrograms (mcg) of folic acid every day if you become pregnant.  Ask for birth control (contraception) if you want to prevent pregnancy. Osteoporosis and menopause Osteoporosis is a disease in which the bones lose minerals and strength with aging. This can result in bone fractures. If you are 41 years old or older, or if you are at risk for osteoporosis and fractures, ask your health care provider if you should:  Be screened for bone loss.  Take a calcium or vitamin D supplement to lower your risk of fractures.  Be given hormone replacement therapy (HRT) to treat symptoms of menopause. Follow these instructions at home: Lifestyle  Do not use any products that contain nicotine or tobacco, such as cigarettes, e-cigarettes, and chewing tobacco. If you need help quitting, ask your health care provider.  Do not use street drugs.  Do not share needles.  Ask your health care provider for help if you need support or information about quitting drugs. Alcohol use  Do not drink alcohol if: ? Your health care provider tells you not to drink. ? You are pregnant, may be pregnant, or are planning to become pregnant.  If you drink alcohol: ? Limit how much you use to 0-1 drink a day. ? Limit intake if you are breastfeeding.  Be aware of how much alcohol is in your drink. In the [place]  one drink equals one 12 oz bottle of beer (355 mL), one 5 oz glass of wine (148 mL), or one 1 oz glass of hard liquor (44 mL). General instructions  Schedule regular health, dental, and eye exams.  Stay current with your vaccines.  Tell your health care provider if: ? You often feel depressed. ? You have ever been abused or do not feel safe at home. Summary  Adopting a healthy lifestyle and getting preventive care are important in  promoting health and wellness.  Follow your health care provider's instructions about healthy diet, exercising, and getting tested or screened for diseases.  Follow your health care provider's instructions on monitoring your cholesterol and blood pressure. This information is not intended to replace advice given to you by your health care provider. Make sure you discuss any questions you have with your health care provider. Document Revised: 08/01/2018 Document Reviewed: 08/01/2018 Elsevier Patient Education  2021 [employer].

## 2020-12-02 NOTE — Progress Notes (Signed)
Subjective:   Olivia Cruz is a 70 y.o. female who presents for Medicare Annual (Subsequent) preventive examination.  This wellness visit is conducted by a nurse.  The patient's medications were reviewed and reconciled since the patient's last visit.  History details were provided by the patient.  The history appears to be reliable.    Patient's last AWV was two years ago.   Medical History: Patient history and Family history was reviewed  Medications, Allergies, and preventative health maintenance was reviewed and updated.   Review of Systems    Review of Systems  Constitutional: Positive for fatigue.  HENT: Negative.   Eyes: Negative.   Respiratory: Negative.  Negative for cough, shortness of breath and wheezing.   Cardiovascular: Negative.  Negative for chest pain and palpitations.  Gastrointestinal: Negative.   Genitourinary: Negative.   Musculoskeletal: Positive for arthralgias.       Left ankle pain, muscle atrophy in palm of left hand  Neurological: Negative.  Negative for dizziness, weakness, numbness and headaches.  Psychiatric/Behavioral: Negative.  Negative for confusion, dysphoric mood, hallucinations and suicidal ideas. The patient is not nervous/anxious.    Cardiac Risk Factors include: advanced age (>61men, >82 women);dyslipidemia;hypertension     Objective:     12/02/20 0909  BP: 134/78  Pulse: 82  Resp: 16  SpO2: 97%  Weight: 157 lb 9.6 oz (71.5 kg)  Height: 5\' 2"  (1.575 m)  PainSc: 3   PainLoc: Ankle   Body mass index is 28.83 kg/m.  Advanced Directives 12/02/2020 03/09/2016  Does Patient Have a Medical Advance Directive? Yes Yes  Type of Paramedic of [place];Living will [place];Living will  Does patient want to make changes to medical advance directive? Yes (MAU/Ambulatory/Procedural Areas - Information given) No - Patient declined  Copy of [place] in Chart? No - copy requested  No - copy requested    Current Medications (verified) Outpatient Encounter Medications as of 12/02/2020  Medication Sig  . acetaminophen (TYLENOL) 325 MG tablet Take 500 mg by mouth every 6 (six) hours as needed.  Marland Kitchen albuterol (PROAIR HFA) 108 (90 Base) MCG/ACT inhaler Inhale two puffs every four to six hours as needed for cough or wheeze.  . budesonide (RHINOCORT AQUA) 32 MCG/ACT nasal spray Place 1 spray into both nostrils daily. Reported on 02/04/2016  . Calcium Carbonate-Vitamin D (CALCIUM + D PO) Take 1,200 mg by mouth daily.  . cholecalciferol (VITAMIN D3) 25 MCG (1000 UNIT) tablet Take 1,000 Units by mouth in the morning, at noon, and at bedtime.  . ciclopirox (PENLAC) 8 % solution APPLY TO TOENAIL EVERY NIGHT FOR 7 NIGHTS, ON 8TH NIGHT WIPE WITH ALCOHOL AND REPEAT  . diclofenac Sodium (VOLTAREN) 1 % GEL   . folic acid (FOLVITE) 1 MG tablet Take 1 mg by mouth daily.   Marland Kitchen gabapentin (NEURONTIN) 300 MG capsule Take 300 mg by mouth in the morning, at noon, and at bedtime.  Marland Kitchen levothyroxine (SYNTHROID) 50 MCG tablet Take 1 tablet (50 mcg total) by mouth daily.  . Magnesium 250 MG TABS Take 250 mg by mouth daily.  . meloxicam (MOBIC) 15 MG tablet Take 1 tablet by mouth daily.  . Multiple Vitamin (MULTIVITAMIN) capsule Take 1 capsule by mouth daily.  . Multiple Vitamins-Minerals (HAIR SKIN AND NAILS FORMULA PO) Take by mouth daily.  . nitrofurantoin, macrocrystal-monohydrate, (MACROBID) 100 MG capsule 1 po orally, post coitally  . pantoprazole (PROTONIX) 40 MG tablet Take 1 tablet (40 mg  total) by mouth daily.  . pramipexole (MIRAPEX) 1 MG tablet TAKE 1 TABLET (1 MG) BY ORAL ROUTE DAILY  . predniSONE (DELTASONE) 5 MG tablet Take by mouth.  . SENNA CO by Combination route.  . valACYclovir (VALTREX) 500 MG tablet daily as needed.   . venlafaxine XR (EFFEXOR XR) 75 MG 24 hr capsule Take 1 capsule (75 mg total) by mouth daily with breakfast.  . methotrexate (RHEUMATREX) 2.5 MG tablet Take 20 mg by  mouth once a week. Takes 8 tablets (20 mg) once a week (Patient not taking: Reported on 12/02/2020)   No facility-administered encounter medications on file as of 12/02/2020.    Allergies (verified) Codeine, Ambien [zolpidem tartrate], Augmentin [amoxicillin-pot clavulanate], Cyproheptadine hcl, Dilaudid [hydromorphone], Hydrocodone, Lisinopril, Oxycodone, Prednisone, Sulfa antibiotics, Tramadol, and Other   History: Past Medical History:  Diagnosis Date  . Allergic rhinoconjunctivitis   . Anxiety   . Asthma   . Depression   . Diverticulitis   . GERD (gastroesophageal reflux disease)   . Glaucoma   . Hypothyroid   . Insomnia   . Laryngopharyngeal reflux (LPR)   . Migraines   . Osteoarthritis   . Psoriatic arthritis ([place])   . Skin cancer   . STD (sexually transmitted disease)    HSV II   Past Surgical History:  Procedure Laterality Date  . BELPHAROPTOSIS REPAIR    . BREAST SURGERY Left    times 2  . COLONOSCOPY  06/03/2013   Moderate predominantly sigmoid diverticulosis. Small interal hemorroids  . FOOT SURGERY Right    Removed bone spur  . GANGLION CYST EXCISION Left    foot  . HEMORRHOID SURGERY    . SKIN CANCER EXCISION    . TOTAL KNEE ARTHROPLASTY Right 07/21/2020  . UPPER GI ENDOSCOPY  03/23/2016   Mild gastritis, gastric polyps bxbenign squamous mucosa with no abnormaility, fundic glad poly in setting of mild chron gastritis.   Family History  Problem Relation Age of Onset  . Breast cancer Sister        lung  . Cancer Maternal Grandmother   . Multiple births Maternal Grandmother   . Multiple births Paternal Grandmother   . Cancer Paternal Grandmother   . Thyroid disease Mother   . Heart failure Mother   . Heart disease Mother   . Hyperlipidemia Mother   . Hypertension Mother   . Stroke Mother   . Depression Mother   . Parkinson's disease Father   . Glaucoma Father   . Diverticulitis Father    Social History   Socioeconomic History  . Marital  status: Married  Occupational History  . Not on file  Tobacco Use  . Smoking status: Former Smoker    Types: Cigarettes    Quit date: 1986    Years since quitting: 36.3  . Smokeless tobacco: Never Used  Vaping Use  . Vaping Use: Never used  Substance and Sexual Activity  . Alcohol use: No    Alcohol/week: 0.0 standard drinks  . Drug use: No  . Sexual activity: Yes    Partners: Male    Comment: husband vasectomy   Social Determinants of Health   Financial Resource Strain: Low Risk   . Difficulty of Paying Living Expenses: Not hard at all  Food Insecurity: No Food Insecurity  . Worried About Charity fundraiser in the Last Year: Never true  . Ran Out of Food in the Last Year: Never true  Transportation Needs: No Transportation Needs  . Lack  of Transportation (Medical): No  . Lack of Transportation (Non-Medical): No  Physical Activity: Sufficiently Active  . Days of Exercise per Week: 5 days  . Minutes of Exercise per Session: 30 min  Stress: No Stress Concern Present  . Feeling of Stress : Only a little  Social Connections: Socially Integrated  . Frequency of Communication with Friends and Family: More than three times a week  . Frequency of Social Gatherings with Friends and Family: More than three times a week  . Attends Religious Services: More than 4 times per year  . Active Member of Clubs or Organizations: Yes  . Attends Archivist Meetings: More than 4 times per year  . Marital Status: Married    Tobacco Counseling Counseling given: Quit 1986  Clinical Intake:  Pre-visit preparation completed: Yes  Pain : 0-10 Pain Score: 3  Pain Type: Acute pain Pain Location: Ankle Pain Orientation: Left Pain Descriptors / Indicators: Aching Pain Onset: 1 to 4 weeks ago Pain Frequency: Intermittent Effect of Pain on Daily Activities: minimal   BMI - recorded: 28.83 Nutritional Status: BMI 25 -29 Overweight Nutritional Risks: None Diabetes: No  How  often do you need to have someone help you when you read instructions, pamphlets, or other written materials from your doctor or pharmacy?: 1 - Never  Interpreter Needed?: No   Activities of Daily Living In your present state of health, do you have any difficulty performing the following activities: 12/02/2020 03/20/2020  Hearing? N N  Vision? N N  Difficulty concentrating or making decisions? N N  Walking or climbing stairs? N N  Dressing or bathing? N N  Doing errands, shopping? N N  Preparing Food and eating ? N -  Using the Toilet? N -  In the past six months, have you accidently leaked urine? N -  Do you have problems with loss of bowel control? N -  Managing your Medications? N -  Managing your Finances? N -  Housekeeping or managing your Housekeeping? N -  Some recent data might be hidden    Patient Care Team: Rochel Brome, MD as PCP - General (Family Medicine) Jolene Schimke, MD as Referring Physician (Dermatology) Mercie Eon, MD as Referring Physician (Rheumatology) Burnice Logan, [facility] as Pharmacist (Pharmacist) Burnice Logan, [facility] as Pharmacist (Pharmacist) Rosanne Ashing, MD as Referring Physician (Orthopedic Surgery) Sharyn Dross., DPM (Podiatry)    Assessment:   This is a routine wellness examination for [place].  Hearing/Vision screen Patient has upcoming appointment for vision exam  Dietary issues and exercise activities discussed: Current Exercise Habits: Structured exercise class;Home exercise routine, Type of exercise: strength training/weights;walking, Time (Minutes): 30, Frequency (Times/Week): 5, Weekly Exercise (Minutes/Week): 150, Intensity: Moderate, Exercise limited by: None identified  Goals    . Pharmacy Care Plan     CARE PLAN ENTRY  Current Barriers:  . Chronic Disease Management support, education, and care coordination needs related to Hypertension and Hyperlipidemia   Hypertension BP Readings from Last 3 Encounters:  01/02/20  116/68  12/11/19 102/64  10/28/19 116/72   . Pharmacist Clinical Goal(s): o Over the next 90 days, patient will work with PharmD and providers to maintain BP goal <130/80 . Current regimen:  o Diet and Lifestyle . Interventions: o Continue physical therapy exercises.  o Reviewed medication regimen and lifestyle modifications.  . Patient self care activities - Over the next 90 days, patient will: o Keep up the good work with [employer] and healthy food  choices.  o Check BP as needed, document, and provide at future appointments o Ensure daily salt intake < 2300 mg/day  Hyperlipidemia Lab Results  Component Value Date/Time   LDLCALC 48 02/18/2013 10:29 AM   . Pharmacist Clinical Goal(s): o Over the next 90 days, patient will work with PharmD and providers to maintain LDL goal < 70 . Current regimen:  o Omega-3 2000 mg daily o Aspirin 81 mg daily  o Crestor 10 mg daily  . Interventions: o Recommend healthy diet of lean protein, vegetables and fruit.  o Recommend updated lipid panel drawn with next lab work.  . Patient self care activities - Over the next 90 days, patient will: o Contact provider or pharmacist with any questions or concerns.   Prediabetes Lab Results  Component Value Date/Time   HGBA1C 5.7 (H) 12/11/2019 09:44 AM   . Pharmacist Clinical Goal(s): o Over the next 90 days, patient will work with PharmD and providers to maintain A1c goal <6.5% . Current regimen:  o Diet and Lifestyle  . Interventions: o Recommend patient continue to exercise and eat healthy diet.  o Patient continuing with physical therapy for exercise.  . Patient self care activities - Over the next 90 days, patient will: o Contact provider with any episodes of hypoglycemia  Medication management . Pharmacist Clinical Goal(s): o Over the next 90 days, patient will work with PharmD and providers to maintain optimal medication adherence . Current [facility]:  [facility] . Interventions o Comprehensive medication review performed. o Continue current medication management strategy . Patient self care activities - Over the next 90 days, patient will: o Focus on medication adherence by pill box o Take medications as prescribed o Report any questions or concerns to PharmD and/or provider(s)  Please see past updates related to this goal by clicking on the "Past Updates" button in the selected goal        Depression Screen PHQ 2/9 Scores 12/02/2020 11/09/2020 06/21/2020 04/22/2020 12/11/2019  PHQ - 2 Score 0 0 0 0 1  PHQ- 9 Score 4 4 - - 4    Fall Risk Fall Risk  12/02/2020 12/11/2019  Falls in the past year? 1 1  Number falls in past yr: 1 0  Injury with Fall? 0 0  Risk for fall due to : History of fall(s) -  Follow up Falls evaluation completed Falls evaluation completed;Falls prevention discussed   FALL RISK PREVENTION PERTAINING TO THE HOME:  Any stairs in or around the home? Yes  If so, are there any without handrails? No  Home free of loose throw rugs in walkways, pet beds, electrical cords, etc? Yes  Adequate lighting in your home to reduce risk of falls? Yes   ASSISTIVE DEVICES UTILIZED TO PREVENT FALLS:  Life alert? No  Use of a cane, walker or w/c? No  Grab bars in the bathroom? Yes  Shower chair or bench in shower? Yes  Elevated toilet seat or a handicapped toilet? No   Gait steady and fast without use of assistive device  Cognitive Function:     6CIT Screen 12/02/2020  What Year? 0 points  What month? 0 points  What time? 0 points  Count back from 20 0 points  Months in reverse 0 points  Repeat phrase 0 points  Total Score 0    Immunizations Immunization History  Administered Date(s) Administered  . DTaP 12/03/2012  . Fluad Quad(high Dose 65+) 06/11/2020  . PFIZER(Purple Top)SARS-COV-2 Vaccination 12/12/2019, 01/10/2020, 07/02/2020  . Pneumococcal  Conjugate-13 07/31/2015  . Pneumococcal Polysaccharide-23 08/29/2016   . Tdap 12/10/2012  . Zoster 02/19/2011  . Zoster Recombinat (Shingrix) 05/18/2018    TDAP status: Up to date  Flu Vaccine status: Up to date  Pneumococcal vaccine status: Up to date  Covid-19 vaccine status: Completed vaccines  Qualifies for Shingles Vaccine? Yes   Zostavax completed Yes   Shingrix Completed?: No.    Education has been provided regarding the importance of this vaccine. Patient has been advised to call insurance company to determine out of pocket expense if they have not yet received this vaccine. Advised may also receive vaccine at local pharmacy or Health Dept. Verbalized acceptance and understanding.  Screening Tests Health Maintenance  Topic Date Due  . DEXA SCAN  09/24/2017  . INFLUENZA VACCINE  03/22/2021  . MAMMOGRAM  03/31/2021  . TETANUS/TDAP  12/11/2022  . COLONOSCOPY (Pts 45-17yrs Insurance coverage will need to be confirmed)  12/08/2027  . COVID-19 Vaccine  Completed  . Hepatitis C Screening  Completed  . PNA vac Low Risk Adult  Completed  . HPV VACCINES  Aged Out    Health Maintenance  Health Maintenance Due  Topic Date Due  . DEXA SCAN  09/24/2017    Colorectal cancer screening: Type of screening: Colonoscopy. Completed 11/2017.  Mammogram status: Completed 03/2020. Repeat every year  Bone Density status: Ordered   Lung Cancer Screening: (Low Dose CT Chest recommended if Age 48-80 years, 30 pack-year currently smoking OR have quit w/in 15years.) does not qualify.   Additional Screening:  Vision Screening: Recommended annual ophthalmology exams for early detection of glaucoma and other disorders of the eye. Is the patient up to date with their annual eye exam?  Yes   Dental Screening: Recommended annual dental exams for proper oral hygiene    Plan:     1- DEXA scan ordered, last done in 2019 result was osteopenia 2- Mammogram - due in August 2022  I have personally reviewed and noted the following in the patient's chart:    . Medical and social history . Use of alcohol, tobacco or illicit drugs  . Current medications and supplements . Functional ability and status . Nutritional status . Physical activity . Advanced directives . List of other physicians . Hospitalizations, surgeries, and ER visits in previous 12 months . Vitals . Screenings to include cognitive, depression, and falls . Referrals and appointments  In addition, I have reviewed and discussed with patient certain preventive protocols, quality metrics, and best practice recommendations. A written personalized care plan for preventive services as well as general preventive health recommendations were provided to patient.     Erie Noe, LPN   05/15/2682

## 2020-12-08 DIAGNOSIS — R293 Abnormal posture: Secondary | ICD-10-CM | POA: Diagnosis not present

## 2020-12-08 DIAGNOSIS — M25661 Stiffness of right knee, not elsewhere classified: Secondary | ICD-10-CM | POA: Diagnosis not present

## 2020-12-08 DIAGNOSIS — M25572 Pain in left ankle and joints of left foot: Secondary | ICD-10-CM | POA: Diagnosis not present

## 2020-12-08 DIAGNOSIS — M6788 Other specified disorders of synovium and tendon, other site: Secondary | ICD-10-CM | POA: Diagnosis not present

## 2020-12-08 DIAGNOSIS — M25672 Stiffness of left ankle, not elsewhere classified: Secondary | ICD-10-CM | POA: Diagnosis not present

## 2020-12-08 DIAGNOSIS — M6281 Muscle weakness (generalized): Secondary | ICD-10-CM | POA: Diagnosis not present

## 2020-12-08 DIAGNOSIS — M25662 Stiffness of left knee, not elsewhere classified: Secondary | ICD-10-CM | POA: Diagnosis not present

## 2020-12-09 ENCOUNTER — Telehealth: Payer: Self-pay

## 2020-12-09 NOTE — Telephone Encounter (Signed)
Pt called questioning how to taper Effexor. She would like to stop medication. States it has a higher copay. She follows up with Dr. Tobie Poet 4/29. She is asking now instead of then due to needing refill. She has refill at pharmacy that needs to be picked up but wanted to know what to do before getting it. Please advise.   Harrell Lark 12/09/20 9:43 AM

## 2020-12-10 ENCOUNTER — Telehealth: Payer: Self-pay | Admitting: Family Medicine

## 2020-12-10 ENCOUNTER — Telehealth: Payer: Self-pay

## 2020-12-10 DIAGNOSIS — H25813 Combined forms of age-related cataract, bilateral: Secondary | ICD-10-CM | POA: Diagnosis not present

## 2020-12-10 DIAGNOSIS — M25662 Stiffness of left knee, not elsewhere classified: Secondary | ICD-10-CM | POA: Diagnosis not present

## 2020-12-10 DIAGNOSIS — M6788 Other specified disorders of synovium and tendon, other site: Secondary | ICD-10-CM | POA: Diagnosis not present

## 2020-12-10 DIAGNOSIS — M25672 Stiffness of left ankle, not elsewhere classified: Secondary | ICD-10-CM | POA: Diagnosis not present

## 2020-12-10 DIAGNOSIS — M25661 Stiffness of right knee, not elsewhere classified: Secondary | ICD-10-CM | POA: Diagnosis not present

## 2020-12-10 DIAGNOSIS — M25572 Pain in left ankle and joints of left foot: Secondary | ICD-10-CM | POA: Diagnosis not present

## 2020-12-10 DIAGNOSIS — R293 Abnormal posture: Secondary | ICD-10-CM | POA: Diagnosis not present

## 2020-12-10 DIAGNOSIS — Z0100 Encounter for examination of eyes and vision without abnormal findings: Secondary | ICD-10-CM | POA: Diagnosis not present

## 2020-12-10 DIAGNOSIS — M6281 Muscle weakness (generalized): Secondary | ICD-10-CM | POA: Diagnosis not present

## 2020-12-10 NOTE — Telephone Encounter (Signed)
Made pt aware.   Olivia Cruz 12/10/20 4:44 PM

## 2020-12-10 NOTE — Telephone Encounter (Signed)
   Olivia Cruz has been scheduled for the following appointment:  WHAT: Dexa Scan WHERE: [place]: 12/15/20 TIME: arrive @ 2:30 for 3:00 appt.  A message has been left for the patient.

## 2020-12-10 NOTE — Telephone Encounter (Signed)
Decrease to every other day x 2 weeks then stop it. (technically she does not need to wean, but if wishes to she may do so.)

## 2020-12-10 NOTE — Telephone Encounter (Cosign Needed)
  Chronic Care Management   Note  12/10/2020 Name: Olivia Cruz MRN: [id number] DOB: 1950/11/08   Patient called wanting to update pharmacist on some recent medication changes.   She has stopped methotrexate and citalopram. She had concerns about [facility] stating there was an interaction with meloxicam and venlafaxine. We discussed increased risk of bleeding and recommendation to monitor. Patient has started gabapentin for nerve pain in knee. She is back to water aerobics and doing better with her knee.   Sherre Poot, PharmD, [facility] Clinical Pharmacist [facility] [phone] (office) [phone] (mobile)

## 2020-12-11 DIAGNOSIS — Z79899 Other long term (current) drug therapy: Secondary | ICD-10-CM | POA: Diagnosis not present

## 2020-12-11 DIAGNOSIS — R2231 Localized swelling, mass and lump, right upper limb: Secondary | ICD-10-CM | POA: Diagnosis not present

## 2020-12-11 DIAGNOSIS — Z96651 Presence of right artificial knee joint: Secondary | ICD-10-CM | POA: Diagnosis not present

## 2020-12-11 DIAGNOSIS — L4050 Arthropathic psoriasis, unspecified: Secondary | ICD-10-CM | POA: Diagnosis not present

## 2020-12-11 DIAGNOSIS — R6889 Other general symptoms and signs: Secondary | ICD-10-CM | POA: Diagnosis not present

## 2020-12-11 DIAGNOSIS — Z85828 Personal history of other malignant neoplasm of skin: Secondary | ICD-10-CM | POA: Diagnosis not present

## 2020-12-11 DIAGNOSIS — R053 Chronic cough: Secondary | ICD-10-CM | POA: Diagnosis not present

## 2020-12-11 DIAGNOSIS — M1990 Unspecified osteoarthritis, unspecified site: Secondary | ICD-10-CM | POA: Diagnosis not present

## 2020-12-14 ENCOUNTER — Other Ambulatory Visit: Payer: Medicare HMO

## 2020-12-14 ENCOUNTER — Other Ambulatory Visit: Payer: Self-pay

## 2020-12-14 DIAGNOSIS — E782 Mixed hyperlipidemia: Secondary | ICD-10-CM

## 2020-12-14 DIAGNOSIS — I10 Essential (primary) hypertension: Secondary | ICD-10-CM

## 2020-12-14 DIAGNOSIS — L4050 Arthropathic psoriasis, unspecified: Secondary | ICD-10-CM

## 2020-12-14 NOTE — Addendum Note (Signed)
Addended by: Alan Ripper A on: 12/14/2020 07:51 AM   Modules accepted: Orders

## 2020-12-15 DIAGNOSIS — M25572 Pain in left ankle and joints of left foot: Secondary | ICD-10-CM | POA: Diagnosis not present

## 2020-12-15 DIAGNOSIS — M6281 Muscle weakness (generalized): Secondary | ICD-10-CM | POA: Diagnosis not present

## 2020-12-15 DIAGNOSIS — R293 Abnormal posture: Secondary | ICD-10-CM | POA: Diagnosis not present

## 2020-12-15 DIAGNOSIS — M8589 Other specified disorders of bone density and structure, multiple sites: Secondary | ICD-10-CM | POA: Diagnosis not present

## 2020-12-15 DIAGNOSIS — M6788 Other specified disorders of synovium and tendon, other site: Secondary | ICD-10-CM | POA: Diagnosis not present

## 2020-12-15 DIAGNOSIS — M25662 Stiffness of left knee, not elsewhere classified: Secondary | ICD-10-CM | POA: Diagnosis not present

## 2020-12-15 DIAGNOSIS — M25672 Stiffness of left ankle, not elsewhere classified: Secondary | ICD-10-CM | POA: Diagnosis not present

## 2020-12-15 DIAGNOSIS — N959 Unspecified menopausal and perimenopausal disorder: Secondary | ICD-10-CM | POA: Diagnosis not present

## 2020-12-15 DIAGNOSIS — M25661 Stiffness of right knee, not elsewhere classified: Secondary | ICD-10-CM | POA: Diagnosis not present

## 2020-12-15 LAB — CBC WITH DIFFERENTIAL/PLATELET
Basophils Absolute: 0 10*3/uL (ref 0.0–0.2)
Basos: 1 %
EOS (ABSOLUTE): 0.2 10*3/uL (ref 0.0–0.4)
Eos: 3 %
Hematocrit: 42.6 % (ref 34.0–46.6)
Hemoglobin: 14.2 g/dL (ref 11.1–15.9)
Immature Grans (Abs): 0 10*3/uL (ref 0.0–0.1)
Immature Granulocytes: 0 %
Lymphocytes Absolute: 2.1 10*3/uL (ref 0.7–3.1)
Lymphs: 34 %
MCH: 30.1 pg (ref 26.6–33.0)
MCHC: 33.3 g/dL (ref 31.5–35.7)
MCV: 90 fL (ref 79–97)
Monocytes Absolute: 0.5 10*3/uL (ref 0.1–0.9)
Monocytes: 8 %
Neutrophils Absolute: 3.5 10*3/uL (ref 1.4–7.0)
Neutrophils: 54 %
Platelets: 302 10*3/uL (ref 150–450)
RBC: 4.71 x10E6/uL (ref 3.77–5.28)
RDW: 15.1 % (ref 11.7–15.4)
WBC: 6.3 10*3/uL (ref 3.4–10.8)

## 2020-12-15 LAB — CARDIOVASCULAR RISK ASSESSMENT

## 2020-12-15 LAB — LIPID PANEL
Chol/HDL Ratio: 4.4 ratio (ref 0.0–4.4)
Cholesterol, Total: 228 mg/dL — ABNORMAL HIGH (ref 100–199)
HDL: 52 mg/dL (ref >39)
LDL Chol Calc (NIH): 138 mg/dL — ABNORMAL HIGH (ref 0–99)
Triglycerides: 210 mg/dL — ABNORMAL HIGH (ref 0–149)
VLDL Cholesterol Cal: 38 mg/dL (ref 5–40)

## 2020-12-15 LAB — COMPREHENSIVE METABOLIC PANEL
ALT: 19 IU/L (ref 0–32)
AST: 24 IU/L (ref 0–40)
Albumin/Globulin Ratio: 1.9 (ref 1.2–2.2)
Albumin: 4.5 g/dL (ref 3.8–4.8)
Alkaline Phosphatase: 102 IU/L (ref 44–121)
BUN/Creatinine Ratio: 24 (ref 12–28)
BUN: 17 mg/dL (ref 8–27)
Bilirubin Total: 0.2 mg/dL (ref 0.0–1.2)
CO2: 19 mmol/L — ABNORMAL LOW (ref 20–29)
Calcium: 9.8 mg/dL (ref 8.7–10.3)
Chloride: 105 mmol/L (ref 96–106)
Creatinine, Ser: 0.71 mg/dL (ref 0.57–1.00)
Globulin, Total: 2.4 g/dL (ref 1.5–4.5)
Glucose: 92 mg/dL (ref 65–99)
Potassium: 4.3 mmol/L (ref 3.5–5.2)
Sodium: 141 mmol/L (ref 134–144)
Total Protein: 6.9 g/dL (ref 6.0–8.5)
eGFR: 91 mL/min/{1.73_m2} (ref >59)

## 2020-12-16 ENCOUNTER — Other Ambulatory Visit: Payer: Self-pay | Admitting: General Practice

## 2020-12-16 DIAGNOSIS — M25572 Pain in left ankle and joints of left foot: Secondary | ICD-10-CM | POA: Diagnosis not present

## 2020-12-16 DIAGNOSIS — M6281 Muscle weakness (generalized): Secondary | ICD-10-CM | POA: Diagnosis not present

## 2020-12-16 DIAGNOSIS — M25662 Stiffness of left knee, not elsewhere classified: Secondary | ICD-10-CM | POA: Diagnosis not present

## 2020-12-16 DIAGNOSIS — M6788 Other specified disorders of synovium and tendon, other site: Secondary | ICD-10-CM | POA: Diagnosis not present

## 2020-12-16 DIAGNOSIS — R293 Abnormal posture: Secondary | ICD-10-CM | POA: Diagnosis not present

## 2020-12-16 DIAGNOSIS — M25672 Stiffness of left ankle, not elsewhere classified: Secondary | ICD-10-CM | POA: Diagnosis not present

## 2020-12-16 DIAGNOSIS — M62542 Muscle wasting and atrophy, not elsewhere classified, left hand: Secondary | ICD-10-CM

## 2020-12-16 DIAGNOSIS — M25661 Stiffness of right knee, not elsewhere classified: Secondary | ICD-10-CM | POA: Diagnosis not present

## 2020-12-16 LAB — QUANTIFERON-TB GOLD PLUS
QuantiFERON Mitogen Value: >10 IU/mL
QuantiFERON Nil Value: 0 IU/mL
QuantiFERON TB1 Ag Value: 0.01 IU/mL
QuantiFERON TB2 Ag Value: 0.06 IU/mL
QuantiFERON-TB Gold Plus: NEGATIVE

## 2020-12-17 NOTE — Progress Notes (Signed)
Subjective:  Patient ID: Olivia Cruz, female    DOB: April 13, 1951  Age: 70 y.o. MRN: [id number]  Chief Complaint  Patient presents with  . Gastroesophageal Reflux  . Hyperlipidemia  . Hypertension  . Anxiety  . Depression    HPI Mixed hyperlipidemia Patient is not taking any medication. She is eating healthy and exercising.  Mild recurrent major depression ([place]) She is not taking Venlafaxine. She has weaned off.   Essential hypertension Patient is eating healthy and exercise. Not on any medicines.   Prediabetes She is in low carb diet.  Gastroesophageal reflux disease without esophagitis Patient is currently taking pantoprazole 40 mg daily.  Current Outpatient Medications on File Prior to Visit  Medication Sig Dispense Refill  . acetaminophen (TYLENOL) 325 MG tablet Take 500 mg by mouth every 6 (six) hours as needed.    . budesonide (RHINOCORT AQUA) 32 MCG/ACT nasal spray Place 1 spray into both nostrils daily. Reported on 02/04/2016    . Calcium Carbonate-Vitamin D (CALCIUM + D PO) Take 1,200 mg by mouth daily.    . cholecalciferol (VITAMIN D3) 25 MCG (1000 UNIT) tablet Take 1,000 Units by mouth in the morning, at noon, and at bedtime.    . ciclopirox (PENLAC) 8 % solution APPLY TO TOENAIL EVERY NIGHT FOR 7 NIGHTS, ON 8TH NIGHT WIPE WITH ALCOHOL AND REPEAT    . diclofenac Sodium (VOLTAREN) 1 % GEL     . gabapentin (NEURONTIN) 300 MG capsule Take 300 mg by mouth in the morning, at noon, and at bedtime.    Marland Kitchen levothyroxine (SYNTHROID) 50 MCG tablet Take 1 tablet (50 mcg total) by mouth daily. 90 tablet 1  . Magnesium 250 MG TABS Take 250 mg by mouth daily.    . meloxicam (MOBIC) 15 MG tablet Take 1 tablet by mouth daily.    . methotrexate (RHEUMATREX) 2.5 MG tablet Take 20 mg by mouth once a week. Takes 8 tablets (20 mg) once a week (Patient not taking: Reported on 12/02/2020)    . Multiple Vitamin (MULTIVITAMIN) capsule Take 1 capsule by mouth daily.    . Multiple  Vitamins-Minerals (HAIR SKIN AND NAILS FORMULA PO) Take by mouth daily.    . nitrofurantoin, macrocrystal-monohydrate, (MACROBID) 100 MG capsule 1 po orally, post coitally 30 capsule 2  . pantoprazole (PROTONIX) 40 MG tablet Take 1 tablet (40 mg total) by mouth daily. 90 tablet 1  . pramipexole (MIRAPEX) 1 MG tablet TAKE 1 TABLET (1 MG) BY ORAL ROUTE DAILY 90 tablet 1  . SENNA CO by Combination route.    . valACYclovir (VALTREX) 500 MG tablet daily as needed.      No current facility-administered medications on file prior to visit.   Past Medical History:  Diagnosis Date  . Allergic rhinoconjunctivitis   . Anxiety   . Asthma   . Depression   . Diverticulitis   . GERD (gastroesophageal reflux disease)   . Glaucoma   . Hypothyroid   . Insomnia   . Laryngopharyngeal reflux (LPR)   . Migraines   . Osteoarthritis   . Psoriatic arthritis ([place])   . Skin cancer   . STD (sexually transmitted disease)    HSV II   Past Surgical History:  Procedure Laterality Date  . BELPHAROPTOSIS REPAIR    . BREAST SURGERY Left    times 2  . COLONOSCOPY  06/03/2013   Moderate predominantly sigmoid diverticulosis. Small interal hemorroids  . FOOT SURGERY Right    Removed bone spur  .  GANGLION CYST EXCISION Left    foot  . HEMORRHOID SURGERY    . SKIN CANCER EXCISION    . TOTAL KNEE ARTHROPLASTY Right 07/21/2020  . UPPER GI ENDOSCOPY  03/23/2016   Mild gastritis, gastric polyps bxbenign squamous mucosa with no abnormaility, fundic glad poly in setting of mild chron gastritis.    Family History  Problem Relation Age of Onset  . Breast cancer Sister        lung  . Cancer Maternal Grandmother   . Multiple births Maternal Grandmother   . Multiple births Paternal Grandmother   . Cancer Paternal Grandmother   . Thyroid disease Mother   . Heart failure Mother   . Heart disease Mother   . Hyperlipidemia Mother   . Hypertension Mother   . Stroke Mother   . Depression Mother   . Parkinson's  disease Father   . Glaucoma Father   . Diverticulitis Father    Social History   Socioeconomic History  . Marital status: Married    Spouse name: Not on file  . Number of children: Not on file  . Years of education: Not on file  . Highest education level: Not on file  Occupational History  . Not on file  Tobacco Use  . Smoking status: Former Smoker    Types: Cigarettes    Quit date: 1986    Years since quitting: 36.3  . Smokeless tobacco: Never Used  Vaping Use  . Vaping Use: Never used  Substance and Sexual Activity  . Alcohol use: No    Alcohol/week: 0.0 standard drinks  . Drug use: No  . Sexual activity: Yes    Partners: Male    Comment: husband vasectomy  Other Topics Concern  . Not on file  Social History Narrative  . Not on file   Social Determinants of Health   Financial Resource Strain: Low Risk   . Difficulty of Paying Living Expenses: Not hard at all  Food Insecurity: No Food Insecurity  . Worried About Charity fundraiser in the Last Year: Never true  . Ran Out of Food in the Last Year: Never true  Transportation Needs: No Transportation Needs  . Lack of Transportation (Medical): No  . Lack of Transportation (Non-Medical): No  Physical Activity: Sufficiently Active  . Days of Exercise per Week: 5 days  . Minutes of Exercise per Session: 30 min  Stress: No Stress Concern Present  . Feeling of Stress : Only a little  Social Connections: Socially Integrated  . Frequency of Communication with Friends and Family: More than three times a week  . Frequency of Social Gatherings with Friends and Family: More than three times a week  . Attends Religious Services: More than 4 times per year  . Active Member of Clubs or Organizations: Yes  . Attends Archivist Meetings: More than 4 times per year  . Marital Status: Married    Review of Systems  Constitutional: Negative for chills, fatigue and fever.  HENT: Negative for congestion, ear pain,  rhinorrhea and sore throat.   Eyes: Positive for visual disturbance.  Respiratory: Negative for shortness of breath.   Cardiovascular: Negative for chest pain.  Gastrointestinal: Negative for abdominal pain, constipation, diarrhea, nausea and vomiting.  Endocrine: Positive for polyphagia.  Genitourinary: Negative for dysuria and urgency.  Musculoskeletal: Positive for arthralgias. Negative for back pain and myalgias.  Neurological: Negative for dizziness, weakness, light-headedness and headaches.  Psychiatric/Behavioral: Negative for dysphoric mood. The patient is  not nervous/anxious.      Objective:  BP 108/70   Pulse 80   Temp 97.6 F (36.4 C)   Ht 5\' 2"  (1.575 m)   Wt 157 lb 6.4 oz (71.4 kg)   LMP 08/22/2005   BMI 28.79 kg/m   BP/Weight 12/18/2020 12/02/2020 5/46/5681  Systolic BP 275 170 017  Diastolic BP 70 78 76  Wt. (Lbs) 157.4 157.6 158  BMI 28.79 28.83 28.9    Physical Exam Vitals reviewed.  Constitutional:      Appearance: Normal appearance. She is normal weight.  Neck:     Vascular: No carotid bruit.  Cardiovascular:     Rate and Rhythm: Normal rate and regular rhythm.     Pulses: Normal pulses.     Heart sounds: Normal heart sounds.  Pulmonary:     Effort: Pulmonary effort is normal. No respiratory distress.     Breath sounds: Normal breath sounds.  Abdominal:     General: Abdomen is flat. Bowel sounds are normal.     Palpations: Abdomen is soft.     Tenderness: There is no abdominal tenderness.  Musculoskeletal:     Comments: Left thumb thenar area atrophy.  Neurological:     Mental Status: She is alert and oriented to person, place, and time.  Psychiatric:        Mood and Affect: Mood normal.        Behavior: Behavior normal.        Thought Content: Thought content normal.     Diabetic Foot Exam - Simple   No data filed      Lab Results  Component Value Date   WBC 6.3 12/14/2020   HGB 14.2 12/14/2020   HCT 42.6 12/14/2020   PLT 302  12/14/2020   GLUCOSE 92 12/14/2020   CHOL 228 (H) 12/14/2020   TRIG 210 (H) 12/14/2020   HDL 52 12/14/2020   LDLCALC 138 (H) 12/14/2020   ALT 19 12/14/2020   AST 24 12/14/2020   NA 141 12/14/2020   K 4.3 12/14/2020   CL 105 12/14/2020   CREATININE 0.71 12/14/2020   BUN 17 12/14/2020   CO2 19 (L) 12/14/2020   TSH 3.990 09/15/2020   HGBA1C 5.7 (H) 12/11/2019      Assessment & Plan:   1. Mixed hyperlipidemia Recommend continue to work on eating healthy diet and exercise. - Restart Crestor.  2. Secondary hypothyroidism Well controlled.  No changes to medicines.  Continue to work on eating a healthy diet and exercise.  - Continue with same medication Levothyroxine 50 mcg daily.   3. Psoriatic arthritis ([place]) The current medical regimen is effective;  continue present plan and medications.  4. RLS (restless legs syndrome) - Cut mirapex 1 mg 1/2 daily x 1 week, then stop mirapex.  5. Mild recurrent major depression ([place]) - Patient is off of effexor.  6. Essential hypertension Well controlled.  No medication changes recommended. Continue healthy diet and exercise.   7. Prediabetes Recommend continue to work on eating healthy diet and exercise.  8. Gastroesophageal reflux disease without esophagitis Well controlled.  No changes to medicines.   I,Quinzell Malcomb,acting as a Education administrator for Rochel Brome, MD.,have documented all relevant documentation on the behalf of Rochel Brome, MD,as directed by  Rochel Brome, MD while in the presence of Rochel Brome, MD.  Meds ordered this encounter  Medications  . rosuvastatin (CRESTOR) 10 MG tablet    Sig: Take 1 tablet (10 mg total) by mouth daily.  Dispense:  90 tablet    Refill:  0  . benzonatate (TESSALON) 200 MG capsule    Sig: Take 1 capsule (200 mg total) by mouth 3 (three) times daily as needed for cough.    Dispense:  30 capsule    Refill:  0  . levothyroxine (SYNTHROID) 50 MCG tablet    Sig: Take 1 tablet (50 mcg total) by  mouth daily.    Dispense:  90 tablet    Refill:  1    No orders of the defined types were placed in this encounter.    Follow-up: No follow-ups on file.  An After Visit Summary was printed and given to the patient.  Rochel Brome, MD [facility] [phone]

## 2020-12-18 ENCOUNTER — Ambulatory Visit (INDEPENDENT_AMBULATORY_CARE_PROVIDER_SITE_OTHER): Payer: Medicare HMO

## 2020-12-18 ENCOUNTER — Other Ambulatory Visit: Payer: Self-pay

## 2020-12-18 ENCOUNTER — Encounter: Payer: Self-pay | Admitting: Family Medicine

## 2020-12-18 ENCOUNTER — Ambulatory Visit (INDEPENDENT_AMBULATORY_CARE_PROVIDER_SITE_OTHER): Payer: Medicare HMO | Admitting: Family Medicine

## 2020-12-18 VITALS — BP 108/70 | HR 80 | Temp 97.6°F | Ht 62.0 in | Wt 157.4 lb

## 2020-12-18 DIAGNOSIS — R7303 Prediabetes: Secondary | ICD-10-CM | POA: Diagnosis not present

## 2020-12-18 DIAGNOSIS — I10 Essential (primary) hypertension: Secondary | ICD-10-CM | POA: Diagnosis not present

## 2020-12-18 DIAGNOSIS — G2581 Restless legs syndrome: Secondary | ICD-10-CM | POA: Diagnosis not present

## 2020-12-18 DIAGNOSIS — E782 Mixed hyperlipidemia: Secondary | ICD-10-CM | POA: Diagnosis not present

## 2020-12-18 DIAGNOSIS — L4050 Arthropathic psoriasis, unspecified: Secondary | ICD-10-CM | POA: Diagnosis not present

## 2020-12-18 DIAGNOSIS — F330 Major depressive disorder, recurrent, mild: Secondary | ICD-10-CM

## 2020-12-18 DIAGNOSIS — K219 Gastro-esophageal reflux disease without esophagitis: Secondary | ICD-10-CM | POA: Diagnosis not present

## 2020-12-18 DIAGNOSIS — E038 Other specified hypothyroidism: Secondary | ICD-10-CM | POA: Diagnosis not present

## 2020-12-18 DIAGNOSIS — Z23 Encounter for immunization: Secondary | ICD-10-CM | POA: Diagnosis not present

## 2020-12-18 MED ORDER — ROSUVASTATIN CALCIUM 10 MG PO TABS
10.0000 mg | ORAL_TABLET | Freq: Every day | ORAL | 0 refills | Status: DC
Start: 2020-12-18 — End: 2021-04-05

## 2020-12-18 MED ORDER — LEVOTHYROXINE SODIUM 50 MCG PO TABS: 50.0000 ug | ORAL_TABLET | Freq: Every day | ORAL | 1 refills | Status: DC

## 2020-12-18 MED ORDER — BENZONATATE 200 MG PO CAPS: 200.0000 mg | ORAL_CAPSULE | Freq: Three times a day (TID) | ORAL | 0 refills | Status: DC | PRN

## 2020-12-18 NOTE — Progress Notes (Signed)
   Covid-19 Vaccination Clinic  Name:  Olivia Cruz    MRN: [id number] DOB: 1951/03/12  12/18/2020  Ms. Register was observed post Covid-19 immunization for 15 minutes without incident. She was provided with Vaccine Information Sheet and instruction to access the V-Safe system.   [name] was instructed to call 911 with any severe reactions post vaccine: Marland Kitchen Difficulty breathing  . Swelling of face and throat  . A fast heartbeat  . A bad rash all over body  . Dizziness and weakness

## 2020-12-18 NOTE — Patient Instructions (Addendum)
Cut mirapex 1 mg 1/2 daily x 1 week, then stop mirapex.  Start crestor. Tessalon perles rx sent.

## 2020-12-22 DIAGNOSIS — R293 Abnormal posture: Secondary | ICD-10-CM | POA: Diagnosis not present

## 2020-12-22 DIAGNOSIS — M25662 Stiffness of left knee, not elsewhere classified: Secondary | ICD-10-CM | POA: Diagnosis not present

## 2020-12-22 DIAGNOSIS — M25672 Stiffness of left ankle, not elsewhere classified: Secondary | ICD-10-CM | POA: Diagnosis not present

## 2020-12-22 DIAGNOSIS — M6788 Other specified disorders of synovium and tendon, other site: Secondary | ICD-10-CM | POA: Diagnosis not present

## 2020-12-22 DIAGNOSIS — M25572 Pain in left ankle and joints of left foot: Secondary | ICD-10-CM | POA: Diagnosis not present

## 2020-12-22 DIAGNOSIS — M6281 Muscle weakness (generalized): Secondary | ICD-10-CM | POA: Diagnosis not present

## 2020-12-22 DIAGNOSIS — M6702 Short Achilles tendon (acquired), left ankle: Secondary | ICD-10-CM | POA: Diagnosis not present

## 2020-12-22 DIAGNOSIS — M25661 Stiffness of right knee, not elsewhere classified: Secondary | ICD-10-CM | POA: Diagnosis not present

## 2020-12-22 NOTE — Progress Notes (Deleted)
Chronic Care Management Pharmacy Note  12/22/2020 Name:  Olivia Cruz MRN:  [id number] DOB:  March 22, 1951   Plan Updates:  ***  Subjective: Olivia Cruz is an 70 y.o. year old female who is a primary patient of Cox, Kirsten, MD.  The CCM team was consulted for assistance with disease management and care coordination needs.    Engaged with patient by telephone for follow up visit in response to provider referral for pharmacy case management and/or care coordination services.   Consent to Services:  The patient was given information about Chronic Care Management services, agreed to services, and gave verbal consent prior to initiation of services.  Please see initial visit note for detailed documentation.   Patient Care Team: Rochel Brome, MD as PCP - General (Family Medicine) Jolene Schimke, MD as Referring Physician (Dermatology) Mercie Eon, MD as Referring Physician (Rheumatology) Burnice Logan, [facility] as Pharmacist (Pharmacist) Burnice Logan, [facility] as Pharmacist (Pharmacist) Rosanne Ashing, MD as Referring Physician (Orthopedic Surgery) Sharyn Dross., DPM (Podiatry)  Recent office visits: *** 12/18/2020 - recommend restart Crestor. Cut mirapex dose to 1 mg 1/2 tablet daily for 1 week then stop. LDL 138. Blood count, liver function and kidney function normal.   Recent consult visits: *** 12/22/2020 - podiatry - continue physical therapy for achilles tendonosis. Meloxicam prn. May apply Voltaren gel 2-3 times daily.  12/11/2020 - rheumatology - restart methotrexate 6 tablets weekly. Refer to neurology to evaluate hand atrophy and possible impingement. Despite negative EMG. Updated hepatitis test and TB test to begin Humira approval.   Hospital visits: None in previous 6 months  Objective:  Lab Results  Component Value Date   CREATININE 0.71 12/14/2020   BUN 17 12/14/2020   GFRNONAA 88 09/15/2020   GFRAA 102 09/15/2020   NA 141 12/14/2020   K 4.3  12/14/2020   CALCIUM 9.8 12/14/2020   CO2 19 (L) 12/14/2020   GLUCOSE 92 12/14/2020    Lab Results  Component Value Date/Time   HGBA1C 5.7 (H) 12/11/2019 09:44 AM    Last diabetic Eye exam: No results found for: HMDIABEYEEXA  Last diabetic Foot exam: No results found for: HMDIABFOOTEX   Lab Results  Component Value Date   CHOL 228 (H) 12/14/2020   HDL 52 12/14/2020   LDLCALC 138 (H) 12/14/2020   TRIG 210 (H) 12/14/2020   CHOLHDL 4.4 12/14/2020    Hepatic Function Latest Ref Rng & Units 12/14/2020 09/15/2020 09/10/2020  Total Protein 6.0 - 8.5 g/dL 6.9 6.8 6.7  Albumin 3.8 - 4.8 g/dL 4.5 4.4 4.5  AST 0 - 40 IU/L _0 ALT 0 - 32 IU/L 19 - -  Alk Phosphatase 44 - 121 IU/L 102 96 102  Total Bilirubin 0.0 - 1.2 mg/dL 0.2 0.2 0.3  Bilirubin, Direct 0.00 - 0.40 mg/dL - - -    Lab Results  Component Value Date/Time   TSH 3.990 09/15/2020 11:01 AM   TSH 2.370 03/31/2020 09:34 AM    CBC Latest Ref Rng & Units 12/14/2020 09/15/2020 03/31/2020  WBC 3.4 - 10.8 x10E3/uL 6.3 4.9 5.5  Hemoglobin 11.1 - 15.9 g/dL 14.2 13.0 14.0  Hematocrit 34.0 - 46.6 % 42.6 38.3 42.8  Platelets 150 - 450 x10E3/uL 302 381 282    Lab Results  Component Value Date/Time   VD25OH 60 02/18/2013 10:29 AM    Clinical ASCVD: No  The 10-year ASCVD risk score Mikey Bussing DC Jr., et al., 2013) is: 7.3%  Values used to calculate the score:     Age: 29 years     Sex: Female     Is Non-Hispanic African American: No     Diabetic: No     Tobacco smoker: No     Systolic Blood Pressure: 491 mmHg     Is BP treated: No     HDL Cholesterol: 52 mg/dL     Total Cholesterol: 228 mg/dL    Depression screen [facility] 2/9 12/18/2020 12/02/2020 11/09/2020  Decreased Interest 0 0 0  Down, Depressed, Hopeless 0 0 0  PHQ - 2 Score 0 0 0  Altered sleeping - 2 2  Tired, decreased energy - 2 2  Change in appetite - 0 0  Feeling bad or failure about yourself  - 0 0  Trouble concentrating - 0 0  Moving slowly or fidgety/restless  - 0 0  Suicidal thoughts - 0 0  PHQ-9 Score - 4 4  Difficult doing work/chores - Somewhat difficult Somewhat difficult     ***Other: (CHADS2VASc if Afib, MMRC or CAT for COPD, ACT, DEXA)  Social History   Tobacco Use  Smoking Status Former Smoker  . Types: Cigarettes  . Quit date: 9  . Years since quitting: 36.3  Smokeless Tobacco Never Used   BP Readings from Last 3 Encounters:  12/18/20 108/70  12/02/20 134/78  11/09/20 124/76   Pulse Readings from Last 3 Encounters:  12/18/20 80  12/02/20 82  11/09/20 74   Wt Readings from Last 3 Encounters:  12/18/20 157 lb 6.4 oz (71.4 kg)  12/02/20 157 lb 9.6 oz (71.5 kg)  11/09/20 158 lb (71.7 kg)   BMI Readings from Last 3 Encounters:  12/18/20 28.79 kg/m  12/02/20 28.83 kg/m  11/09/20 28.90 kg/m    Assessment/Interventions: Review of patient past medical history, allergies, medications, health status, including review of consultants reports, laboratory and other test data, was performed as part of comprehensive evaluation and provision of chronic care management services.   SDOH:  (Social Determinants of Health) assessments and interventions performed: Yes  SDOH Screenings   Alcohol Screen: Low Risk   . Last Alcohol Screening Score (AUDIT): 0  Depression (PHQ2-9): Low Risk   . PHQ-2 Score: 0  Financial Resource Strain: Low Risk   . Difficulty of Paying Living Expenses: Not hard at all  Food Insecurity: No Food Insecurity  . Worried About Charity fundraiser in the Last Year: Never true  . Ran Out of Food in the Last Year: Never true  Housing: Low Risk   . Last Housing Risk Score: 0  Physical Activity: Sufficiently Active  . Days of Exercise per Week: 5 days  . Minutes of Exercise per Session: 30 min  Social Connections: Socially Integrated  . Frequency of Communication with Friends and Family: More than three times a week  . Frequency of Social Gatherings with Friends and Family: More than three times a week   . Attends Religious Services: More than 4 times per year  . Active Member of Clubs or Organizations: Yes  . Attends Archivist Meetings: More than 4 times per year  . Marital Status: Married  Stress: No Stress Concern Present  . Feeling of Stress : Only a little  Tobacco Use: Medium Risk  . Smoking Tobacco Use: Former Smoker  . Smokeless Tobacco Use: Never Used  Transportation Needs: No Transportation Needs  . Lack of Transportation (Medical): No  . Lack of Transportation (Non-Medical): No  CCM Care Plan  Allergies  Allergen Reactions  . Codeine Anaphylaxis and Nausea Only  . Ambien [Zolpidem Tartrate] Other (See Comments)    Memory loss per patient  . Augmentin [Amoxicillin-Pot Clavulanate] Nausea And Vomiting  . Cyproheptadine Hcl Other (See Comments)    Confusion  . Dilaudid [Hydromorphone]     hallucinations  . Hydrocodone Other (See Comments)    anaphylaxis  . Lisinopril   . Oxycodone     Anaphylaxis  . Prednisone   . Sulfa Antibiotics Other (See Comments)    hives  . Tramadol   . Other Rash    Paper tape caused rash    Medications Reviewed Today    Reviewed by Rochel Brome, MD (Physician) on 12/18/20 at 1001  Med List Status: <None>  Medication Order Taking? Sig Documenting Provider Last Dose Status Informant  acetaminophen (TYLENOL) 325 MG tablet [id number] No Take 500 mg by mouth every 6 (six) hours as needed. [provider] Taking Active   budesonide (RHINOCORT AQUA) 32 MCG/ACT nasal spray [id number] No Place 1 spray into both nostrils daily. Reported on 02/04/2016 [provider] Taking Active   Calcium Carbonate-Vitamin D (CALCIUM + D PO) [id number] No Take 1,200 mg by mouth daily. [provider] Taking Active   cholecalciferol (VITAMIN D3) 25 MCG (1000 UNIT) tablet [id number] No Take 1,000 Units by mouth in the morning, at noon, and at bedtime. [provider] Taking Active Self  ciclopirox (PENLAC) 8 % solution  [id number] No APPLY TO TOENAIL EVERY NIGHT FOR 7 NIGHTS, ON 8TH NIGHT WIPE WITH ALCOHOL AND REPEAT [provider] Taking Active   diclofenac Sodium (VOLTAREN) 1 % GEL [id number] No  [provider] Taking Active   gabapentin (NEURONTIN) 300 MG capsule [id number] No Take 300 mg by mouth in the morning, at noon, and at bedtime. [provider] Taking Active   levothyroxine (SYNTHROID) 50 MCG tablet [id number] No Take 1 tablet (50 mcg total) by mouth daily. Rochel Brome, MD Taking Active   Magnesium 250 MG TABS [id number] No Take 250 mg by mouth daily. [provider] Taking Active   meloxicam (MOBIC) 15 MG tablet [id number] No Take 1 tablet by mouth daily. [provider] Taking Active   methotrexate (RHEUMATREX) 2.5 MG tablet [id number] No Take 20 mg by mouth once a week. Takes 8 tablets (20 mg) once a week  Patient not taking: Reported on 12/02/2020   [provider] Not Taking Active   Multiple Vitamin (MULTIVITAMIN) capsule [id number] No Take 1 capsule by mouth daily. [provider] Taking Active   Multiple Vitamins-Minerals (HAIR SKIN AND NAILS FORMULA PO) [id number] No Take by mouth daily. [provider] Taking Active   nitrofurantoin, macrocrystal-monohydrate, (MACROBID) 100 MG capsule [id number] No 1 po orally, post coitally Megan Salon, MD Taking Active   pantoprazole (PROTONIX) 40 MG tablet [id number] No Take 1 tablet (40 mg total) by mouth daily. Cox, Kirsten, MD Taking Active   pramipexole (MIRAPEX) 1 MG tablet [id number] No TAKE 1 TABLET (1 MG) BY ORAL ROUTE DAILY Cox, Kirsten, MD Taking Active   SENNA CO [id number] No by Combination route. [provider] Taking Active   valACYclovir (VALTREX) 500 MG tablet [id number] No daily as needed.  [provider] Taking Active           Patient Active Problem List   Diagnosis Date Noted  . Bilateral primary osteoarthritis of knee 04/14/2020  . Secondary  hypothyroidism 12/22/2019  . Essential hypertension 12/22/2019  .  Prediabetes 12/22/2019  . Mixed hyperlipidemia 12/22/2019  . RLS (restless legs syndrome) 12/22/2019  . Psoriatic arthritis ([place]) 10/28/2019  . Idiopathic progressive neuropathy 10/28/2019  . Acute cystitis without hematuria 09/29/2019  . Laryngopharyngeal reflux (LPR) 09/12/2018  . Vocal fold atrophy 09/12/2018  . Shortness of breath 07/12/2017  . Chest tightness 09/29/2015  . Cervical os stenosis 04/09/2015  . Postmenopausal atrophic vaginitis 04/09/2015    Immunization History  Administered Date(s) Administered  . DTaP 12/03/2012  . Fluad Quad(high Dose 65+) 06/11/2020  . PFIZER Comirnaty(Gray Top)Covid-19 Tri-Sucrose Vaccine 12/18/2020  . PFIZER(Purple Top)SARS-COV-2 Vaccination 12/12/2019, 01/10/2020, 07/02/2020  . Pneumococcal Conjugate-13 07/31/2015  . Pneumococcal Polysaccharide-23 08/29/2016  . Tdap 12/10/2012  . Zoster 02/19/2011  . Zoster Recombinat (Shingrix) 05/18/2018    Conditions to be addressed/monitored:  Hypertension, prediabetes, restless leg syndrome and Hyperlipidemia  There are no care plans that you recently modified to display for this patient.    Medication Assistance: None required.  Patient affirms current coverage meets needs.  Patient's preferred pharmacy is:  [facility], [address] [place] [address] Phone: [phone] Fax: [phone]  [id number] [place], [address]Phone: [phone]: [phone] Uses pill box? Yes Pt endorses ***% compliance  We discussed: Current pharmacy is preferred with insurance plan and patient is satisfied with pharmacy services Patient decided to: Continue current medication management strategy  Care Plan and Follow Up Patient Decision:  Patient agrees to Care Plan and Follow-up.  Plan: Telephone  follow up appointment with care management team member scheduled for:  ***  ***    Current Barriers:  . {pharmacybarriers:24917}  Pharmacist Clinical Goal(s):  .Marland KitchenPatient will {PHARMACYGOALCHOICES:24921} through collaboration with PharmD and provider.   Interventions: . 1:1 collaboration with CRochel Brome MD regarding development and update of comprehensive plan of care as evidenced by provider attestation and co-signature . Inter-disciplinary care team collaboration (see longitudinal plan of care) . Comprehensive medication review performed; medication list updated in electronic medical record  Hypertension (BP goal {CHL HP UPSTREAM Pharmacist BP ranges:[phone]}) -{US controlled/uncontrolled:25276} -Current treatment: . *** -Medications previously tried: ***  -Current home readings: *** -Current dietary habits: *** -Current exercise habits: *** -{ACTIONS;DENIES/REPORTS:21021675::"Denies"} hypotensive/hypertensive symptoms -Educated on {CCM BP Counseling:25124} -Counseled to monitor BP at home ***, document, and provide log at future appointments -{CCMPHARMDINTERVENTION:25122}  Hyperlipidemia: (LDL goal < ***) -{US controlled/uncontrolled:25276} -Current treatment: . *** -Medications previously tried: ***  -Current dietary patterns: *** -Current exercise habits: *** -Educated on {CCM HLD Counseling:25126} -{CCMPHARMDINTERVENTION:25122}  Prediabetes (A1c goal {A1c goals:23924}) -{US controlled/uncontrolled:25276} -Current medications: . *** -Medications previously tried: ***  -Current home glucose readings . fasting glucose: *** . post prandial glucose: *** -{ACTIONS;DENIES/REPORTS:21021675::"Denies"} hypoglycemic/hyperglycemic symptoms -Current meal patterns:  . breakfast: ***  . lunch: ***  . dinner: *** . snacks: *** . drinks: *** -Current exercise: *** -Educated on {CCM DM COUNSELING:25123} -Counseled to check feet daily and get yearly eye  exams -{CCMPHARMDINTERVENTION:25122}  *** (Goal: ***) -{US controlled/uncontrolled:25276} -Current treatment  . *** -Medications previously tried: ***  -{CCMPHARMDINTERVENTION:25122}   Patient Goals/Self-Care Activities . Patient will:  - {pharmacypatientgoals:24919}  Follow Up Plan: {CM FOLLOW UP PESPQ:33007}

## 2020-12-24 DIAGNOSIS — M25662 Stiffness of left knee, not elsewhere classified: Secondary | ICD-10-CM | POA: Diagnosis not present

## 2020-12-24 DIAGNOSIS — M6281 Muscle weakness (generalized): Secondary | ICD-10-CM | POA: Diagnosis not present

## 2020-12-24 DIAGNOSIS — M25661 Stiffness of right knee, not elsewhere classified: Secondary | ICD-10-CM | POA: Diagnosis not present

## 2020-12-24 DIAGNOSIS — M25572 Pain in left ankle and joints of left foot: Secondary | ICD-10-CM | POA: Diagnosis not present

## 2020-12-24 DIAGNOSIS — M25672 Stiffness of left ankle, not elsewhere classified: Secondary | ICD-10-CM | POA: Diagnosis not present

## 2020-12-24 DIAGNOSIS — M6788 Other specified disorders of synovium and tendon, other site: Secondary | ICD-10-CM | POA: Diagnosis not present

## 2020-12-24 DIAGNOSIS — R293 Abnormal posture: Secondary | ICD-10-CM | POA: Diagnosis not present

## 2020-12-25 ENCOUNTER — Telehealth: Payer: Self-pay

## 2020-12-25 ENCOUNTER — Other Ambulatory Visit: Payer: Self-pay | Admitting: Family Medicine

## 2020-12-25 ENCOUNTER — Telehealth: Payer: Medicare HMO

## 2020-12-25 MED ORDER — DIAZEPAM 2 MG PO TABS: ORAL_TABLET | ORAL | 0 refills | Status: DC

## 2020-12-25 NOTE — Telephone Encounter (Signed)
Patient called and is scheduled for a MRI tomorrow.  She is requesting something be sent in to calm her nerves during the MRI.  Please send to [facility].

## 2020-12-26 ENCOUNTER — Other Ambulatory Visit: Payer: Self-pay

## 2020-12-26 ENCOUNTER — Ambulatory Visit
Admission: RE | Admit: 2020-12-26 | Discharge: 2020-12-26 | Disposition: A | Discharge status: Home / Self Care | Payer: Medicare HMO | Source: Ambulatory Visit | Attending: General Practice | Admitting: General Practice

## 2020-12-26 DIAGNOSIS — M25842 Other specified joint disorders, left hand: Secondary | ICD-10-CM | POA: Diagnosis not present

## 2020-12-26 DIAGNOSIS — M62542 Muscle wasting and atrophy, not elsewhere classified, left hand: Secondary | ICD-10-CM

## 2020-12-28 ENCOUNTER — Ambulatory Visit (INDEPENDENT_AMBULATORY_CARE_PROVIDER_SITE_OTHER): Payer: Medicare HMO | Admitting: Family Medicine

## 2020-12-28 ENCOUNTER — Encounter: Payer: Self-pay | Admitting: Family Medicine

## 2020-12-28 ENCOUNTER — Other Ambulatory Visit: Payer: Self-pay

## 2020-12-28 VITALS — BP 124/70 | HR 100 | Temp 97.3°F | Resp 16 | Ht 60.5 in | Wt 161.2 lb

## 2020-12-28 DIAGNOSIS — G2581 Restless legs syndrome: Secondary | ICD-10-CM | POA: Diagnosis not present

## 2020-12-28 DIAGNOSIS — L4050 Arthropathic psoriasis, unspecified: Secondary | ICD-10-CM

## 2020-12-28 MED ORDER — CLONAZEPAM 0.5 MG PO TABS: 0.5000 mg | ORAL_TABLET | Freq: Every day | ORAL | 0 refills | Status: DC

## 2020-12-28 NOTE — Patient Instructions (Signed)
Start clonazepam 0.5 mg once at bedtime for Restless leg syndrome.

## 2020-12-28 NOTE — Progress Notes (Signed)
Subjective:  Patient ID: Olivia Cruz, female    DOB: 1951/08/02  Age: 70 y.o. MRN: [id number]  Chief Complaint  Patient presents with  . rls    HPI RLS: Ate mustard did not help. Magnesium supplement which was helping, but not now. Increased magnesium 400 mg once daily. Failed on requip and mirapex. Taking gabapentin 300 mg one in am and 2 at night. [place] a week ago. No major injuries. Spoke with orthopedics.   Current Outpatient Medications on File Prior to Visit  Medication Sig Dispense Refill  . acetaminophen (TYLENOL) 325 MG tablet Take 500 mg by mouth every 6 (six) hours as needed.    . benzonatate (TESSALON) 200 MG capsule Take 1 capsule (200 mg total) by mouth 3 (three) times daily as needed for cough. 30 capsule 0  . budesonide (RHINOCORT AQUA) 32 MCG/ACT nasal spray Place 1 spray into both nostrils daily. Reported on 02/04/2016    . Calcium Carbonate-Vitamin D (CALCIUM + D PO) Take 1,200 mg by mouth daily.    . cholecalciferol (VITAMIN D3) 25 MCG (1000 UNIT) tablet Take 1,000 Units by mouth in the morning, at noon, and at bedtime.    . ciclopirox (PENLAC) 8 % solution APPLY TO TOENAIL EVERY NIGHT FOR 7 NIGHTS, ON 8TH NIGHT WIPE WITH ALCOHOL AND REPEAT    . diclofenac Sodium (VOLTAREN) 1 % GEL     . gabapentin (NEURONTIN) 300 MG capsule Take 300 mg by mouth in the morning, at noon, and at bedtime.    Marland Kitchen levothyroxine (SYNTHROID) 50 MCG tablet Take 1 tablet (50 mcg total) by mouth daily. 90 tablet 1  . levothyroxine (SYNTHROID) 50 MCG tablet Take 1 tablet (50 mcg total) by mouth daily. 90 tablet 1  . Magnesium 250 MG TABS Take 250 mg by mouth daily.    . meloxicam (MOBIC) 15 MG tablet Take 1 tablet by mouth daily.    . methotrexate (RHEUMATREX) 2.5 MG tablet Take 20 mg by mouth once a week. Takes 8 tablets (20 mg) once a week (Patient not taking: Reported on 12/02/2020)    . Multiple Vitamin (MULTIVITAMIN) capsule Take 1 capsule by mouth daily.    . Multiple Vitamins-Minerals  (HAIR SKIN AND NAILS FORMULA PO) Take by mouth daily.    . nitrofurantoin, macrocrystal-monohydrate, (MACROBID) 100 MG capsule 1 po orally, post coitally 30 capsule 2  . pantoprazole (PROTONIX) 40 MG tablet Take 1 tablet (40 mg total) by mouth daily. 90 tablet 1  . rosuvastatin (CRESTOR) 10 MG tablet Take 1 tablet (10 mg total) by mouth daily. 90 tablet 0  . SENNA CO by Combination route.    . valACYclovir (VALTREX) 500 MG tablet daily as needed.      No current facility-administered medications on file prior to visit.   Past Medical History:  Diagnosis Date  . Allergic rhinoconjunctivitis   . Anxiety   . Asthma   . Depression   . Diverticulitis   . GERD (gastroesophageal reflux disease)   . Glaucoma   . Hypothyroid   . Insomnia   . Laryngopharyngeal reflux (LPR)   . Migraines   . Osteoarthritis   . Psoriatic arthritis ([place])   . Skin cancer   . STD (sexually transmitted disease)    HSV II   Past Surgical History:  Procedure Laterality Date  . BELPHAROPTOSIS REPAIR    . BREAST SURGERY Left    times 2  . COLONOSCOPY  06/03/2013   Moderate predominantly sigmoid diverticulosis. Small interal  hemorroids  . FOOT SURGERY Right    Removed bone spur  . GANGLION CYST EXCISION Left    foot  . HEMORRHOID SURGERY    . SKIN CANCER EXCISION    . TOTAL KNEE ARTHROPLASTY Right 07/21/2020  . UPPER GI ENDOSCOPY  03/23/2016   Mild gastritis, gastric polyps bxbenign squamous mucosa with no abnormaility, fundic glad poly in setting of mild chron gastritis.    Family History  Problem Relation Age of Onset  . Breast cancer Sister        lung  . Cancer Maternal Grandmother   . Multiple births Maternal Grandmother   . Multiple births Paternal Grandmother   . Cancer Paternal Grandmother   . Thyroid disease Mother   . Heart failure Mother   . Heart disease Mother   . Hyperlipidemia Mother   . Hypertension Mother   . Stroke Mother   . Depression Mother   . Parkinson's disease Father    . Glaucoma Father   . Diverticulitis Father    Social History   Socioeconomic History  . Marital status: Married    Spouse name: Not on file  . Number of children: Not on file  . Years of education: Not on file  . Highest education level: Not on file  Occupational History  . Not on file  Tobacco Use  . Smoking status: Former Smoker    Types: Cigarettes    Quit date: 1986    Years since quitting: 36.3  . Smokeless tobacco: Never Used  Vaping Use  . Vaping Use: Never used  Substance and Sexual Activity  . Alcohol use: No    Alcohol/week: 0.0 standard drinks  . Drug use: No  . Sexual activity: Yes    Partners: Male    Comment: husband vasectomy  Other Topics Concern  . Not on file  Social History Narrative  . Not on file   Social Determinants of Health   Financial Resource Strain: Low Risk   . Difficulty of Paying Living Expenses: Not hard at all  Food Insecurity: No Food Insecurity  . Worried About Charity fundraiser in the Last Year: Never true  . Ran Out of Food in the Last Year: Never true  Transportation Needs: No Transportation Needs  . Lack of Transportation (Medical): No  . Lack of Transportation (Non-Medical): No  Physical Activity: Sufficiently Active  . Days of Exercise per Week: 5 days  . Minutes of Exercise per Session: 30 min  Stress: No Stress Concern Present  . Feeling of Stress : Only a little  Social Connections: Socially Integrated  . Frequency of Communication with Friends and Family: More than three times a week  . Frequency of Social Gatherings with Friends and Family: More than three times a week  . Attends Religious Services: More than 4 times per year  . Active Member of Clubs or Organizations: Yes  . Attends Archivist Meetings: More than 4 times per year  . Marital Status: Married    Review of Systems  Constitutional: Negative for chills, fatigue and fever.  HENT: Negative for congestion, ear pain and sore throat.    Respiratory: Positive for cough. Negative for shortness of breath.   Cardiovascular: Negative for chest pain.     Objective:  BP 124/70   Pulse 100   Temp (!) 97.3 F (36.3 C)   Resp 16   Ht 5' 0.5" (1.537 m)   Wt 161 lb 3.2 oz (73.1 kg)  LMP 08/22/2005   BMI 30.96 kg/m   BP/Weight 12/28/2020 12/18/2020 9/56/3875  Systolic BP 643 329 518  Diastolic BP 70 70 78  Wt. (Lbs) 161.2 157.4 157.6  BMI 30.96 28.79 28.83    Physical Exam Vitals reviewed.  Constitutional:      Appearance: Normal appearance. She is normal weight.  Cardiovascular:     Rate and Rhythm: Normal rate and regular rhythm.     Pulses: Normal pulses.     Heart sounds: Normal heart sounds.  Pulmonary:     Effort: Pulmonary effort is normal. No respiratory distress.     Breath sounds: Normal breath sounds.  Musculoskeletal:        General: No tenderness.     Comments: Atrophy left thenar eminence  Neurological:     Mental Status: She is alert and oriented to person, place, and time.  Psychiatric:        Mood and Affect: Mood normal.        Behavior: Behavior normal.     Diabetic Foot Exam - Simple   No data filed      Lab Results  Component Value Date   WBC 6.3 12/14/2020   HGB 14.2 12/14/2020   HCT 42.6 12/14/2020   PLT 302 12/14/2020   GLUCOSE 92 12/14/2020   CHOL 228 (H) 12/14/2020   TRIG 210 (H) 12/14/2020   HDL 52 12/14/2020   LDLCALC 138 (H) 12/14/2020   ALT 19 12/14/2020   AST 24 12/14/2020   NA 141 12/14/2020   K 4.3 12/14/2020   CL 105 12/14/2020   CREATININE 0.71 12/14/2020   BUN 17 12/14/2020   CO2 19 (L) 12/14/2020   TSH 3.990 09/15/2020   HGBA1C 5.7 (H) 12/11/2019      Assessment & Plan:   1. RLS (restless legs syndrome) Do not take with valium. Stop valium. This was given for sedation before MRI. Start clonazepam.  - clonazePAM (KLONOPIN) 0.5 MG tablet; Take 1 tablet (0.5 mg total) by mouth at bedtime.  Dispense: 30 tablet; Refill: 0  2. Psoriatic arthritis  ([place]) - Hepatitis panel, acute    Meds ordered this encounter  Medications  . clonazePAM (KLONOPIN) 0.5 MG tablet    Sig: Take 1 tablet (0.5 mg total) by mouth at bedtime.    Dispense:  30 tablet    Refill:  0    Orders Placed This Encounter  Procedures  . Acute Viral Hepatitis (HAV, HBV, HCV)  . Interpretation:     Follow-up: Return in about 4 weeks (around 01/25/2021) for RLS.  An After Visit Summary was printed and given to the patient.  Rochel Brome, MD [facility] [phone]

## 2020-12-29 DIAGNOSIS — M25572 Pain in left ankle and joints of left foot: Secondary | ICD-10-CM | POA: Diagnosis not present

## 2020-12-29 DIAGNOSIS — M6281 Muscle weakness (generalized): Secondary | ICD-10-CM | POA: Diagnosis not present

## 2020-12-29 DIAGNOSIS — M6788 Other specified disorders of synovium and tendon, other site: Secondary | ICD-10-CM | POA: Diagnosis not present

## 2020-12-29 DIAGNOSIS — M25661 Stiffness of right knee, not elsewhere classified: Secondary | ICD-10-CM | POA: Diagnosis not present

## 2020-12-29 DIAGNOSIS — M25662 Stiffness of left knee, not elsewhere classified: Secondary | ICD-10-CM | POA: Diagnosis not present

## 2020-12-29 DIAGNOSIS — R293 Abnormal posture: Secondary | ICD-10-CM | POA: Diagnosis not present

## 2020-12-29 DIAGNOSIS — M25672 Stiffness of left ankle, not elsewhere classified: Secondary | ICD-10-CM | POA: Diagnosis not present

## 2020-12-29 LAB — ACUTE VIRAL HEPATITIS (HAV, HBV, HCV)
HCV Ab: 0.1 s/co ratio (ref 0.0–0.9)
Hep A IgM: NEGATIVE
Hep B C IgM: NEGATIVE
Hepatitis B Surface Ag: NEGATIVE

## 2020-12-29 LAB — HCV INTERPRETATION

## 2020-12-30 DIAGNOSIS — M25461 Effusion, right knee: Secondary | ICD-10-CM | POA: Diagnosis not present

## 2020-12-30 DIAGNOSIS — M6158 Other ossification of muscle, other site: Secondary | ICD-10-CM | POA: Diagnosis not present

## 2020-12-30 DIAGNOSIS — Z9181 History of falling: Secondary | ICD-10-CM | POA: Diagnosis not present

## 2020-12-30 DIAGNOSIS — M25561 Pain in right knee: Secondary | ICD-10-CM | POA: Diagnosis not present

## 2020-12-30 DIAGNOSIS — Z96651 Presence of right artificial knee joint: Secondary | ICD-10-CM | POA: Diagnosis not present

## 2020-12-31 DIAGNOSIS — M25661 Stiffness of right knee, not elsewhere classified: Secondary | ICD-10-CM | POA: Diagnosis not present

## 2020-12-31 DIAGNOSIS — M25572 Pain in left ankle and joints of left foot: Secondary | ICD-10-CM | POA: Diagnosis not present

## 2020-12-31 DIAGNOSIS — M6281 Muscle weakness (generalized): Secondary | ICD-10-CM | POA: Diagnosis not present

## 2020-12-31 DIAGNOSIS — M6788 Other specified disorders of synovium and tendon, other site: Secondary | ICD-10-CM | POA: Diagnosis not present

## 2020-12-31 DIAGNOSIS — M25662 Stiffness of left knee, not elsewhere classified: Secondary | ICD-10-CM | POA: Diagnosis not present

## 2020-12-31 DIAGNOSIS — M25672 Stiffness of left ankle, not elsewhere classified: Secondary | ICD-10-CM | POA: Diagnosis not present

## 2020-12-31 DIAGNOSIS — R293 Abnormal posture: Secondary | ICD-10-CM | POA: Diagnosis not present

## 2021-01-01 ENCOUNTER — Telehealth: Payer: Self-pay

## 2021-01-01 ENCOUNTER — Encounter: Payer: Self-pay | Admitting: Family Medicine

## 2021-01-01 NOTE — Telephone Encounter (Signed)
Pt called states the klonopin does not help. She will be returning to pramipexole as it helped. Last night she did take this as RLS was horrible and she could not sleep. Wants to make provider aware and see if provider has any more ideas.   Royce Macadamia, [place] 01/01/21 9:30 AM

## 2021-01-01 NOTE — Telephone Encounter (Signed)
Could try clonazepam 0.5 mg 2 twice a day. Sent my chart message.

## 2021-01-02 ENCOUNTER — Other Ambulatory Visit: Payer: Self-pay | Admitting: Family Medicine

## 2021-01-02 DIAGNOSIS — G2581 Restless legs syndrome: Secondary | ICD-10-CM

## 2021-01-03 ENCOUNTER — Encounter: Payer: Self-pay | Admitting: Family Medicine

## 2021-01-05 DIAGNOSIS — M62542 Muscle wasting and atrophy, not elsewhere classified, left hand: Secondary | ICD-10-CM | POA: Diagnosis not present

## 2021-01-06 DIAGNOSIS — R293 Abnormal posture: Secondary | ICD-10-CM | POA: Diagnosis not present

## 2021-01-06 DIAGNOSIS — M25672 Stiffness of left ankle, not elsewhere classified: Secondary | ICD-10-CM | POA: Diagnosis not present

## 2021-01-06 DIAGNOSIS — M6788 Other specified disorders of synovium and tendon, other site: Secondary | ICD-10-CM | POA: Diagnosis not present

## 2021-01-06 DIAGNOSIS — M25662 Stiffness of left knee, not elsewhere classified: Secondary | ICD-10-CM | POA: Diagnosis not present

## 2021-01-06 DIAGNOSIS — M25661 Stiffness of right knee, not elsewhere classified: Secondary | ICD-10-CM | POA: Diagnosis not present

## 2021-01-06 DIAGNOSIS — M6281 Muscle weakness (generalized): Secondary | ICD-10-CM | POA: Diagnosis not present

## 2021-01-06 DIAGNOSIS — M25572 Pain in left ankle and joints of left foot: Secondary | ICD-10-CM | POA: Diagnosis not present

## 2021-01-07 NOTE — Progress Notes (Deleted)
Chronic Care Management Pharmacy Note  01/07/2021 Name:  Olivia AMESCUA MRN:  [id number] DOB:  [birth year]   Plan Updates:  ***  Subjective: Olivia Cruz is an 70 y.o. year old female who is a primary patient of Cox, Kirsten, MD.  The CCM team was consulted for assistance with disease management and care coordination needs.    Engaged with patient by telephone for follow up visit in response to provider referral for pharmacy case management and/or care coordination services.   Consent to Services:  The patient was given information about Chronic Care Management services, agreed to services, and gave verbal consent prior to initiation of services.  Please see initial visit note for detailed documentation.   Patient Care Team: Rochel Brome, MD as PCP - General (Family Medicine) Jolene Schimke, MD as Referring Physician (Dermatology) Mercie Eon, MD as Referring Physician (Rheumatology) Burnice Logan, [facility] as Pharmacist (Pharmacist) Burnice Logan, [facility] as Pharmacist (Pharmacist) Rosanne Ashing, MD as Referring Physician (Orthopedic Surgery) Sharyn Dross., DPM (Podiatry)  Recent office visits: *** 12/18/2020 - recommend restart Crestor. Cut mirapex dose to 1 mg 1/2 tablet daily for 1 week then stop. LDL 138. Blood count, liver function and kidney function normal.   Recent consult visits: *** 12/22/2020 - podiatry - continue physical therapy for achilles tendonosis. Meloxicam prn. May apply Voltaren gel 2-3 times daily.  12/11/2020 - rheumatology - restart methotrexate 6 tablets weekly. Refer to neurology to evaluate hand atrophy and possible impingement. Despite negative EMG. Updated hepatitis test and TB test to begin Humira approval.   Hospital visits: None in previous 6 months  Objective:  Lab Results  Component Value Date   CREATININE 0.71 12/14/2020   BUN 17 12/14/2020   GFRNONAA 88 09/15/2020   GFRAA 102 09/15/2020   NA 141 12/14/2020   K 4.3  12/14/2020   CALCIUM 9.8 12/14/2020   CO2 19 (L) 12/14/2020   GLUCOSE 92 12/14/2020    Lab Results  Component Value Date/Time   HGBA1C 5.7 (H) 12/11/2019 09:44 AM    Last diabetic Eye exam: No results found for: HMDIABEYEEXA  Last diabetic Foot exam: No results found for: HMDIABFOOTEX   Lab Results  Component Value Date   CHOL 228 (H) 12/14/2020   HDL 52 12/14/2020   LDLCALC 138 (H) 12/14/2020   TRIG 210 (H) 12/14/2020   CHOLHDL 4.4 12/14/2020    Hepatic Function Latest Ref Rng & Units 12/14/2020 09/15/2020 09/10/2020  Total Protein 6.0 - 8.5 g/dL 6.9 6.8 6.7  Albumin 3.8 - 4.8 g/dL 4.5 4.4 4.5  AST 0 - 40 IU/L _0 ALT 0 - 32 IU/L 19 - -  Alk Phosphatase 44 - 121 IU/L 102 96 102  Total Bilirubin 0.0 - 1.2 mg/dL 0.2 0.2 0.3  Bilirubin, Direct 0.00 - 0.40 mg/dL - - -    Lab Results  Component Value Date/Time   TSH 3.990 09/15/2020 11:01 AM   TSH 2.370 03/31/2020 09:34 AM    CBC Latest Ref Rng & Units 12/14/2020 09/15/2020 03/31/2020  WBC 3.4 - 10.8 x10E3/uL 6.3 4.9 5.5  Hemoglobin 11.1 - 15.9 g/dL 14.2 13.0 14.0  Hematocrit 34.0 - 46.6 % 42.6 38.3 42.8  Platelets 150 - 450 x10E3/uL 302 381 282    Lab Results  Component Value Date/Time   VD25OH 60 02/18/2013 10:29 AM    Clinical ASCVD: No  The 10-year ASCVD risk score Mikey Bussing DC Jr., et al., 2013) is: 9.8%  Values used to calculate the score:     Age: 23 years     Sex: Female     Is Non-Hispanic African American: No     Diabetic: No     Tobacco smoker: No     Systolic Blood Pressure: 530 mmHg     Is BP treated: No     HDL Cholesterol: 52 mg/dL     Total Cholesterol: 228 mg/dL    Depression screen [facility] 2/9 12/28/2020 12/18/2020 12/02/2020  Decreased Interest 0 0 0  Down, Depressed, Hopeless 0 0 0  PHQ - 2 Score 0 0 0  Altered sleeping - - 2  Tired, decreased energy - - 2  Change in appetite - - 0  Feeling bad or failure about yourself  - - 0  Trouble concentrating - - 0  Moving slowly or fidgety/restless  - - 0  Suicidal thoughts - - 0  PHQ-9 Score - - 4  Difficult doing work/chores - - Somewhat difficult     ***Other: (CHADS2VASc if Afib, MMRC or CAT for COPD, ACT, DEXA)  Social History   Tobacco Use  Smoking Status Former Smoker  . Types: Cigarettes  . Quit date: 35  . Years since quitting: 36.4  Smokeless Tobacco Never Used   BP Readings from Last 3 Encounters:  12/28/20 124/70  12/18/20 108/70  12/02/20 134/78   Pulse Readings from Last 3 Encounters:  12/28/20 100  12/18/20 80  12/02/20 82   Wt Readings from Last 3 Encounters:  12/28/20 161 lb 3.2 oz (73.1 kg)  12/18/20 157 lb 6.4 oz (71.4 kg)  12/02/20 157 lb 9.6 oz (71.5 kg)   BMI Readings from Last 3 Encounters:  12/28/20 30.96 kg/m  12/18/20 28.79 kg/m  12/02/20 28.83 kg/m    Assessment/Interventions: Review of patient past medical history, allergies, medications, health status, including review of consultants reports, laboratory and other test data, was performed as part of comprehensive evaluation and provision of chronic care management services.   SDOH:  (Social Determinants of Health) assessments and interventions performed: Yes  SDOH Screenings   Alcohol Screen: Low Risk   . Last Alcohol Screening Score (AUDIT): 0  Depression (PHQ2-9): Low Risk   . PHQ-2 Score: 0  Financial Resource Strain: Low Risk   . Difficulty of Paying Living Expenses: Not hard at all  Food Insecurity: No Food Insecurity  . Worried About Charity fundraiser in the Last Year: Never true  . Ran Out of Food in the Last Year: Never true  Housing: Low Risk   . Last Housing Risk Score: 0  Physical Activity: Sufficiently Active  . Days of Exercise per Week: 5 days  . Minutes of Exercise per Session: 30 min  Social Connections: Socially Integrated  . Frequency of Communication with Friends and Family: More than three times a week  . Frequency of Social Gatherings with Friends and Family: More than three times a week  .  Attends Religious Services: More than 4 times per year  . Active Member of Clubs or Organizations: Yes  . Attends Archivist Meetings: More than 4 times per year  . Marital Status: Married  Stress: No Stress Concern Present  . Feeling of Stress : Only a little  Tobacco Use: Medium Risk  . Smoking Tobacco Use: Former Smoker  . Smokeless Tobacco Use: Never Used  Transportation Needs: No Transportation Needs  . Lack of Transportation (Medical): No  . Lack of Transportation (Non-Medical): No  CCM Care Plan  Allergies  Allergen Reactions  . Codeine Anaphylaxis and Nausea Only  . Ambien [Zolpidem Tartrate] Other (See Comments)    Memory loss per patient  . Augmentin [Amoxicillin-Pot Clavulanate] Nausea And Vomiting  . Cyproheptadine Hcl Other (See Comments)    Confusion  . Dilaudid [Hydromorphone]     hallucinations  . Hydrocodone Other (See Comments)    anaphylaxis  . Lisinopril   . Oxycodone     Anaphylaxis  . Prednisone   . Sulfa Antibiotics Other (See Comments)    hives  . Tramadol   . Other Rash    Paper tape caused rash    Medications Reviewed Today    Reviewed by Rochel Brome, MD (Physician) on 12/28/20 at 1431  Med List Status: <None>  Medication Order Taking? Sig Documenting Provider Last Dose Status Informant  acetaminophen (TYLENOL) 325 MG tablet [id number] No Take 500 mg by mouth every 6 (six) hours as needed. [provider] Taking Active   benzonatate (TESSALON) 200 MG capsule [id number]  Take 1 capsule (200 mg total) by mouth 3 (three) times daily as needed for cough. Cox, Kirsten, MD  Active   budesonide (RHINOCORT AQUA) 32 MCG/ACT nasal spray [id number] No Place 1 spray into both nostrils daily. Reported on 02/04/2016 [provider] Taking Active   Calcium Carbonate-Vitamin D (CALCIUM + D PO) [id number] No Take 1,200 mg by mouth daily. [provider] Taking Active   cholecalciferol (VITAMIN D3) 25 MCG (1000 UNIT) tablet  [id number] No Take 1,000 Units by mouth in the morning, at noon, and at bedtime. [provider] Taking Active Self  ciclopirox (PENLAC) 8 % solution [id number] No APPLY TO TOENAIL EVERY NIGHT FOR 7 NIGHTS, ON 8TH NIGHT WIPE WITH ALCOHOL AND REPEAT [provider] Taking Active   clonazePAM (KLONOPIN) 0.5 MG tablet [id number] Yes Take 1 tablet (0.5 mg total) by mouth at bedtime. Cox, Kirsten, MD  Active   diclofenac Sodium (VOLTAREN) 1 % GEL [id number] No  [provider] Taking Active   gabapentin (NEURONTIN) 300 MG capsule [id number] No Take 300 mg by mouth in the morning, at noon, and at bedtime. [provider] Taking Active   levothyroxine (SYNTHROID) 50 MCG tablet [id number] No Take 1 tablet (50 mcg total) by mouth daily. Cox, Kirsten, MD Taking Active   levothyroxine (SYNTHROID) 50 MCG tablet [id number]  Take 1 tablet (50 mcg total) by mouth daily. Rochel Brome, MD  Active   Magnesium 250 MG TABS [id number] No Take 250 mg by mouth daily. [provider] Taking Active   meloxicam (MOBIC) 15 MG tablet [id number] No Take 1 tablet by mouth daily. [provider] Taking Active   methotrexate (RHEUMATREX) 2.5 MG tablet [id number] No Take 20 mg by mouth once a week. Takes 8 tablets (20 mg) once a week  Patient not taking: Reported on 12/02/2020   [provider] Not Taking Active   Multiple Vitamin (MULTIVITAMIN) capsule [id number] No Take 1 capsule by mouth daily. [provider] Taking Active   Multiple Vitamins-Minerals (HAIR SKIN AND NAILS FORMULA PO) [id number] No Take by mouth daily. [provider] Taking Active   nitrofurantoin, macrocrystal-monohydrate, (MACROBID) 100 MG capsule [id number] No 1 po orally, post coitally Megan Salon, MD Taking Active   pantoprazole (PROTONIX) 40 MG tablet [id number] No Take 1 tablet (40 mg total) by mouth daily. Cox, Kirsten, MD Taking Active   rosuvastatin (CRESTOR) 10 MG tablet [id number]   Take 1 tablet (10 mg total)  by mouth daily. Rochel Brome, MD  Active   SENNA CO [id number] No by Combination route. [provider] Taking Active   valACYclovir (VALTREX) 500 MG tablet [id number] No daily as needed.  [provider] Taking Active           Patient Active Problem List   Diagnosis Date Noted  . Bilateral primary osteoarthritis of knee 04/14/2020  . Secondary hypothyroidism 12/22/2019  . Essential hypertension 12/22/2019  . Prediabetes 12/22/2019  . Mixed hyperlipidemia 12/22/2019  . RLS (restless legs syndrome) 12/22/2019  . Psoriatic arthritis ([place]) 10/28/2019  . Idiopathic progressive neuropathy 10/28/2019  . Acute cystitis without hematuria 09/29/2019  . Laryngopharyngeal reflux (LPR) 09/12/2018  . Vocal fold atrophy 09/12/2018  . Shortness of breath 07/12/2017  . Chest tightness 09/29/2015  . Cervical os stenosis 04/09/2015  . Postmenopausal atrophic vaginitis 04/09/2015    Immunization History  Administered Date(s) Administered  . DTaP 12/03/2012  . Fluad Quad(high Dose 65+) 06/11/2020  . PFIZER Comirnaty(Gray Top)Covid-19 Tri-Sucrose Vaccine 12/18/2020  . PFIZER(Purple Top)SARS-COV-2 Vaccination 12/12/2019, 01/10/2020, 07/02/2020  . Pneumococcal Conjugate-13 07/31/2015  . Pneumococcal Polysaccharide-23 08/29/2016  . Tdap 12/10/2012  . Zoster 02/19/2011  . Zoster Recombinat (Shingrix) 05/18/2018    Conditions to be addressed/monitored:  Hypertension, prediabetes, restless leg syndrome and Hyperlipidemia  There are no care plans that you recently modified to display for this patient.    Medication Assistance: None required.  Patient affirms current coverage meets needs.  Patient's preferred pharmacy is:  [facility], [place] [address] Phone: [phone] Fax: [phone]  [id number] [place], [id number]: [phone]: [phone] Uses pill box? Yes Pt endorses ***% compliance  We discussed: Current pharmacy is preferred with insurance plan and patient is satisfied with pharmacy services Patient decided to: Continue current medication management strategy  Care Plan and Follow Up Patient Decision:  Patient agrees to Care Plan and Follow-up.  Plan: Telephone follow up appointment with care management team member scheduled for:  ***  ***    Current Barriers:  . {pharmacybarriers:24917}  Pharmacist Clinical Goal(s):  .Marland KitchenPatient will {PHARMACYGOALCHOICES:24921} through collaboration with PharmD and provider.   Interventions: . 1:1 collaboration with CRochel Brome MD regarding development and update of comprehensive plan of care as evidenced by provider attestation and co-signature . Inter-disciplinary care team collaboration (see longitudinal plan of care) . Comprehensive medication review performed; medication list updated in electronic medical record  Hypertension (BP goal {CHL HP UPSTREAM Pharmacist BP [phone]}) -{US controlled/uncontrolled:25276} -Current treatment: . *** -Medications previously tried: ***  -Current home readings: *** -Current dietary habits: *** -Current exercise habits: *** -{ACTIONS;DENIES/REPORTS:21021675::"Denies"} hypotensive/hypertensive symptoms -Educated on {CCM BP Counseling:25124} -Counseled to monitor BP at home ***, document, and provide log at future appointments -{CCMPHARMDINTERVENTION:25122}  Hyperlipidemia: (LDL goal < ***) -{US controlled/uncontrolled:25276} -Current treatment: . *** -Medications previously tried: ***  -Current dietary patterns: *** -Current exercise habits: *** -Educated on {CCM HLD Counseling:25126} -{CCMPHARMDINTERVENTION:25122}  Prediabetes (A1c goal {A1c goals:23924}) -{US controlled/uncontrolled:25276} -Current medications: . *** -Medications previously tried: ***   -Current home glucose readings . fasting glucose: *** . post prandial glucose: *** -{ACTIONS;DENIES/REPORTS:21021675::"Denies"} hypoglycemic/hyperglycemic symptoms -Current meal patterns:  . breakfast: ***  . lunch: ***  . dinner: *** . snacks: *** . drinks: *** -Current exercise: *** -Educated on {CCM DM COUNSELING:25123} -Counseled to check feet daily and get yearly eye  exams -{CCMPHARMDINTERVENTION:25122}  *** (Goal: ***) -{US controlled/uncontrolled:25276} -Current treatment  . *** -Medications previously tried: ***  -{CCMPHARMDINTERVENTION:25122}   Patient Goals/Self-Care Activities . Patient will:  - {pharmacypatientgoals:24919}  Follow Up Plan: {CM FOLLOW UP YPZX:80638}

## 2021-01-08 ENCOUNTER — Telehealth: Payer: Self-pay

## 2021-01-08 ENCOUNTER — Telehealth: Payer: Medicare HMO

## 2021-01-08 DIAGNOSIS — M25572 Pain in left ankle and joints of left foot: Secondary | ICD-10-CM | POA: Diagnosis not present

## 2021-01-08 DIAGNOSIS — M25661 Stiffness of right knee, not elsewhere classified: Secondary | ICD-10-CM | POA: Diagnosis not present

## 2021-01-08 DIAGNOSIS — M25662 Stiffness of left knee, not elsewhere classified: Secondary | ICD-10-CM | POA: Diagnosis not present

## 2021-01-08 DIAGNOSIS — M25672 Stiffness of left ankle, not elsewhere classified: Secondary | ICD-10-CM | POA: Diagnosis not present

## 2021-01-08 DIAGNOSIS — R293 Abnormal posture: Secondary | ICD-10-CM | POA: Diagnosis not present

## 2021-01-08 DIAGNOSIS — M6281 Muscle weakness (generalized): Secondary | ICD-10-CM | POA: Diagnosis not present

## 2021-01-08 DIAGNOSIS — M6788 Other specified disorders of synovium and tendon, other site: Secondary | ICD-10-CM | POA: Diagnosis not present

## 2021-01-08 NOTE — Progress Notes (Signed)
Chronic Care Management Pharmacy Assistant   Name: Olivia Cruz  MRN: [id number] DOB: 1950-09-09   Reason for Encounter: Disease State for general adherence  Recent office visits:  12/18/2020 - recommend restart Crestor. Cut mirapex dose to 1 mg 1/2 tablet daily for 1 week then stop. LDL 138. Blood count, liver function and kidney function normal.    Recent consult visits:  12/22/2020 - podiatry - continue physical therapy for achilles tendonosis. Meloxicam prn. May apply Voltaren gel 2-3 times daily.    12/11/2020 - rheumatology - restart methotrexate 6 tablets weekly. Refer to neurology to evaluate hand atrophy and possible impingement. Despite negative EMG. Updated hepatitis test and TB test to begin Humira approval.    Hospital visits:  None in previous 6 months  Medications: Outpatient Encounter Medications as of 01/08/2021  Medication Sig  . acetaminophen (TYLENOL) 325 MG tablet Take 500 mg by mouth every 6 (six) hours as needed.  . benzonatate (TESSALON) 200 MG capsule Take 1 capsule (200 mg total) by mouth 3 (three) times daily as needed for cough.  . budesonide (RHINOCORT AQUA) 32 MCG/ACT nasal spray Place 1 spray into both nostrils daily. Reported on 02/04/2016  . Calcium Carbonate-Vitamin D (CALCIUM + D PO) Take 1,200 mg by mouth daily.  . cholecalciferol (VITAMIN D3) 25 MCG (1000 UNIT) tablet Take 1,000 Units by mouth in the morning, at noon, and at bedtime.  . ciclopirox (PENLAC) 8 % solution APPLY TO TOENAIL EVERY NIGHT FOR 7 NIGHTS, ON 8TH NIGHT WIPE WITH ALCOHOL AND REPEAT  . clonazePAM (KLONOPIN) 0.5 MG tablet Take 1 tablet (0.5 mg total) by mouth at bedtime.  . diclofenac Sodium (VOLTAREN) 1 % GEL   . gabapentin (NEURONTIN) 300 MG capsule Take 300 mg by mouth in the morning, at noon, and at bedtime.  Marland Kitchen levothyroxine (SYNTHROID) 50 MCG tablet Take 1 tablet (50 mcg total) by mouth daily.  Marland Kitchen levothyroxine (SYNTHROID) 50 MCG tablet Take 1 tablet (50 mcg total) by  mouth daily.  . Magnesium 250 MG TABS Take 250 mg by mouth daily.  . meloxicam (MOBIC) 15 MG tablet Take 1 tablet by mouth daily.  . methotrexate (RHEUMATREX) 2.5 MG tablet Take 20 mg by mouth once a week. Takes 8 tablets (20 mg) once a week (Patient not taking: Reported on 12/02/2020)  . Multiple Vitamin (MULTIVITAMIN) capsule Take 1 capsule by mouth daily.  . Multiple Vitamins-Minerals (HAIR SKIN AND NAILS FORMULA PO) Take by mouth daily.  . nitrofurantoin, macrocrystal-monohydrate, (MACROBID) 100 MG capsule 1 po orally, post coitally  . pantoprazole (PROTONIX) 40 MG tablet Take 1 tablet (40 mg total) by mouth daily.  . rosuvastatin (CRESTOR) 10 MG tablet Take 1 tablet (10 mg total) by mouth daily.  . SENNA CO by Combination route.  . valACYclovir (VALTREX) 500 MG tablet daily as needed.    No facility-administered encounter medications on file as of 01/08/2021.   Patient stated she is doing ok, she was on her way to her last physical therapy appointment for her ankle, she said she should be discharged today.  She stated she stays very active, she does some ole physical therapy exercises for her knee, she goes to the [facility] 2-3 times per week, she walks, and uses exercise bike.  Patient stated she has struggled with weight loss, she stated her husband likes to eat everything.  She is trying to watch her diet and sugar intake.  She stated she has stopped taking the Clonazepam for her restless  leg, she stated she felt drugged, said it made her feel crazy, she stated she has not had any restless leg symptoms for the past 3-4 nights, she has tried to increase her water and said the increased hydration has helped.  Patient stated she goes to see neurology in July 2022.  Patient does check her blood pressures, she stated they stay around 120/70 to 120/80.  She denies any issues with the other medication she is on.   Star Rating Drugs: Levothyroxine  11/25/20  90ds Rosuvastatin  07/01/20 90ds   Patient is taking and stated she had recent fill    Clarita Leber, [place] Pharmacist Assistant [phone]

## 2021-01-13 DIAGNOSIS — M7050 Other bursitis of knee, unspecified knee: Secondary | ICD-10-CM | POA: Diagnosis not present

## 2021-01-13 DIAGNOSIS — Z96651 Presence of right artificial knee joint: Secondary | ICD-10-CM | POA: Diagnosis not present

## 2021-01-13 DIAGNOSIS — Z882 Allergy status to sulfonamides status: Secondary | ICD-10-CM | POA: Diagnosis not present

## 2021-01-13 DIAGNOSIS — M7051 Other bursitis of knee, right knee: Secondary | ICD-10-CM | POA: Diagnosis not present

## 2021-01-13 DIAGNOSIS — Z471 Aftercare following joint replacement surgery: Secondary | ICD-10-CM | POA: Diagnosis not present

## 2021-01-21 NOTE — Progress Notes (Signed)
Pt was late.

## 2021-01-22 ENCOUNTER — Ambulatory Visit (INDEPENDENT_AMBULATORY_CARE_PROVIDER_SITE_OTHER): Payer: Medicare HMO | Admitting: Family Medicine

## 2021-01-22 ENCOUNTER — Other Ambulatory Visit: Payer: Self-pay

## 2021-01-22 VITALS — BP 148/70 | Temp 97.5°F | Wt 160.8 lb

## 2021-01-22 DIAGNOSIS — G2581 Restless legs syndrome: Secondary | ICD-10-CM

## 2021-01-22 DIAGNOSIS — Z7409 Other reduced mobility: Secondary | ICD-10-CM | POA: Insufficient documentation

## 2021-01-22 DIAGNOSIS — L4050 Arthropathic psoriasis, unspecified: Secondary | ICD-10-CM | POA: Diagnosis not present

## 2021-01-22 DIAGNOSIS — F330 Major depressive disorder, recurrent, mild: Secondary | ICD-10-CM | POA: Diagnosis not present

## 2021-01-22 DIAGNOSIS — Z789 Other specified health status: Secondary | ICD-10-CM | POA: Insufficient documentation

## 2021-01-22 MED ORDER — CITALOPRAM HYDROBROMIDE 20 MG PO TABS: 20.0000 mg | ORAL_TABLET | Freq: Every day | ORAL | 0 refills | Status: DC

## 2021-01-22 MED ORDER — DIAZEPAM 5 MG PO TABS: 5.0000 mg | ORAL_TABLET | Freq: Every day | ORAL | 0 refills | Status: DC | PRN

## 2021-01-22 MED ORDER — DICLOFENAC SODIUM 75 MG PO TBEC: 75.0000 mg | DELAYED_RELEASE_TABLET | Freq: Two times a day (BID) | ORAL | 0 refills | Status: DC

## 2021-01-22 MED ORDER — METHOTREXATE 2.5 MG PO TABS: 15.0000 mg | ORAL_TABLET | ORAL | 0 refills | Status: DC

## 2021-01-22 NOTE — Progress Notes (Signed)
Subjective:  Patient ID: Olivia Cruz, female    DOB: April 11, 1951  Age: 70 y.o. MRN: [id number]  Chief Complaint  Patient presents with  . RLS    HPI  RLS: Neither ropinerole and pramipexole worked so changed to clonazepam, but did not tolerate. Only had 2 episodes since last visit. It is only her left leg now. Complaining of rt knee pain. When she took a valium prior to her mri, she had the most relief from her RLS. Increased stress due to dog getting unintentionally poisoned. He at [employer], but then vet told them to give her hydrogen peroxide which causes dog to vomit, but then would not stop vomiting. Had to go to [facility]. Pt restarted citalopram. Has helped some.   Current Outpatient Medications on File Prior to Visit  Medication Sig Dispense Refill  . folic acid (FOLVITE) 1 MG tablet Take 1 tablet by mouth daily.    Marland Kitchen acetaminophen (TYLENOL) 325 MG tablet Take 500 mg by mouth every 6 (six) hours as needed.    . benzonatate (TESSALON) 200 MG capsule Take 1 capsule (200 mg total) by mouth 3 (three) times daily as needed for cough. 30 capsule 0  . budesonide (RHINOCORT AQUA) 32 MCG/ACT nasal spray Place 1 spray into both nostrils daily. Reported on 02/04/2016    . Calcium Carbonate-Vitamin D (CALCIUM + D PO) Take 1,200 mg by mouth daily.    . cholecalciferol (VITAMIN D3) 25 MCG (1000 UNIT) tablet Take 1,000 Units by mouth in the morning, at noon, and at bedtime.    . ciclopirox (PENLAC) 8 % solution APPLY TO TOENAIL EVERY NIGHT FOR 7 NIGHTS, ON 8TH NIGHT WIPE WITH ALCOHOL AND REPEAT    . diclofenac Sodium (VOLTAREN) 1 % GEL     . gabapentin (NEURONTIN) 300 MG capsule Take 300 mg by mouth in the morning, at noon, and at bedtime.    Marland Kitchen levothyroxine (SYNTHROID) 50 MCG tablet Take 1 tablet (50 mcg total) by mouth daily. 90 tablet 1  . Magnesium 250 MG TABS Take 250 mg by mouth daily.    . Multiple Vitamin (MULTIVITAMIN) capsule Take 1 capsule by mouth daily.    . Multiple  Vitamins-Minerals (HAIR SKIN AND NAILS FORMULA PO) Take by mouth daily.    . nitrofurantoin, macrocrystal-monohydrate, (MACROBID) 100 MG capsule 1 po orally, post coitally 30 capsule 2  . pantoprazole (PROTONIX) 40 MG tablet Take 1 tablet (40 mg total) by mouth daily. 90 tablet 1  . rosuvastatin (CRESTOR) 10 MG tablet Take 1 tablet (10 mg total) by mouth daily. 90 tablet 0  . valACYclovir (VALTREX) 500 MG tablet daily as needed.      No current facility-administered medications on file prior to visit.   Past Medical History:  Diagnosis Date  . Allergic rhinoconjunctivitis   . Anxiety   . Asthma   . Depression   . Diverticulitis   . GERD (gastroesophageal reflux disease)   . Glaucoma   . Hypothyroid   . Insomnia   . Laryngopharyngeal reflux (LPR)   . Migraines   . Osteoarthritis   . Psoriatic arthritis ([place])   . Skin cancer   . STD (sexually transmitted disease)    HSV II   Past Surgical History:  Procedure Laterality Date  . BELPHAROPTOSIS REPAIR    . BREAST SURGERY Left    times 2  . COLONOSCOPY  06/03/2013   Moderate predominantly sigmoid diverticulosis. Small interal hemorroids  . FOOT SURGERY Right  Removed bone spur  . GANGLION CYST EXCISION Left    foot  . HEMORRHOID SURGERY    . SKIN CANCER EXCISION    . TOTAL KNEE ARTHROPLASTY Right 07/21/2020  . UPPER GI ENDOSCOPY  03/23/2016   Mild gastritis, gastric polyps bxbenign squamous mucosa with no abnormaility, fundic glad poly in setting of mild chron gastritis.    Family History  Problem Relation Age of Onset  . Breast cancer Sister        lung  . Cancer Maternal Grandmother   . Multiple births Maternal Grandmother   . Multiple births Paternal Grandmother   . Cancer Paternal Grandmother   . Thyroid disease Mother   . Heart failure Mother   . Heart disease Mother   . Hyperlipidemia Mother   . Hypertension Mother   . Stroke Mother   . Depression Mother   . Parkinson's disease Father   . Glaucoma Father    . Diverticulitis Father    Social History   Socioeconomic History  . Marital status: Married    Spouse name: Not on file  . Number of children: Not on file  . Years of education: Not on file  . Highest education level: Not on file  Occupational History  . Not on file  Tobacco Use  . Smoking status: Former Smoker    Types: Cigarettes    Quit date: 1986    Years since quitting: 36.4  . Smokeless tobacco: Never Used  Vaping Use  . Vaping Use: Never used  Substance and Sexual Activity  . Alcohol use: No    Alcohol/week: 0.0 standard drinks  . Drug use: No  . Sexual activity: Yes    Partners: Male    Comment: husband vasectomy  Other Topics Concern  . Not on file  Social History Narrative  . Not on file   Social Determinants of Health   Financial Resource Strain: Low Risk   . Difficulty of Paying Living Expenses: Not hard at all  Food Insecurity: No Food Insecurity  . Worried About Charity fundraiser in the Last Year: Never true  . Ran Out of Food in the Last Year: Never true  Transportation Needs: No Transportation Needs  . Lack of Transportation (Medical): No  . Lack of Transportation (Non-Medical): No  Physical Activity: Sufficiently Active  . Days of Exercise per Week: 5 days  . Minutes of Exercise per Session: 30 min  Stress: No Stress Concern Present  . Feeling of Stress : Only a little  Social Connections: Socially Integrated  . Frequency of Communication with Friends and Family: More than three times a week  . Frequency of Social Gatherings with Friends and Family: More than three times a week  . Attends Religious Services: More than 4 times per year  . Active Member of Clubs or Organizations: Yes  . Attends Archivist Meetings: More than 4 times per year  . Marital Status: Married    Review of Systems  Constitutional: Positive for fatigue and unexpected weight change. Negative for chills and fever.  HENT: Negative for congestion, ear pain,  rhinorrhea and sore throat.   Respiratory: Positive for cough. Negative for shortness of breath.   Cardiovascular: Negative for chest pain.  Gastrointestinal: Negative for abdominal pain, constipation, diarrhea, nausea and vomiting.  Genitourinary: Negative for dysuria and urgency.  Musculoskeletal: Negative for back pain and myalgias.  Neurological: Negative for dizziness, weakness, light-headedness and headaches.  Psychiatric/Behavioral: Negative for dysphoric mood. The patient is  not nervous/anxious.      Objective:  BP (!) 148/70   Temp (!) 97.5 F (36.4 C)   Wt 160 lb 12.8 oz (72.9 kg)   LMP 08/22/2005   BMI 30.89 kg/m   BP/Weight 01/22/2021 12/28/2020 7/34/1937  Systolic BP 902 409 735  Diastolic BP 70 70 70  Wt. (Lbs) 160.8 161.2 157.4  BMI 30.89 30.96 28.79    Physical Exam Vitals reviewed.  Constitutional:      Appearance: Normal appearance.  Cardiovascular:     Rate and Rhythm: Normal rate and regular rhythm.     Heart sounds: Normal heart sounds.  Pulmonary:     Effort: Pulmonary effort is normal.     Breath sounds: Normal breath sounds.  Neurological:     Mental Status: She is alert and oriented to person, place, and time.  Psychiatric:        Mood and Affect: Mood normal.        Behavior: Behavior normal.     Diabetic Foot Exam - Simple   No data filed      Lab Results  Component Value Date   WBC 6.3 12/14/2020   HGB 14.2 12/14/2020   HCT 42.6 12/14/2020   PLT 302 12/14/2020   GLUCOSE 92 12/14/2020   CHOL 228 (H) 12/14/2020   TRIG 210 (H) 12/14/2020   HDL 52 12/14/2020   LDLCALC 138 (H) 12/14/2020   ALT 19 12/14/2020   AST 24 12/14/2020   NA 141 12/14/2020   K 4.3 12/14/2020   CL 105 12/14/2020   CREATININE 0.71 12/14/2020   BUN 17 12/14/2020   CO2 19 (L) 12/14/2020   TSH 3.990 09/15/2020   HGBA1C 5.7 (H) 12/11/2019      Assessment & Plan:  1. RLS (restless legs syndrome) Trial on valium. May cut in 1/2 qhs..   2. Psoriatic  arthritis ([place]) The current medical regimen is effective;  continue present plan and medications.  3. Mild recurrent major depression ([place]) The current medical regimen is effective;  continue present plan and medications.    Meds ordered this encounter  Medications  . methotrexate (RHEUMATREX) 2.5 MG tablet    Sig: Take 6 tablets (15 mg total) by mouth once a week. Takes 8 tablets (20 mg) once a week    Dispense:  1 tablet    Refill:  0  . diclofenac (VOLTAREN) 75 MG EC tablet    Sig: Take 1 tablet (75 mg total) by mouth 2 (two) times daily.    Dispense:  1 tablet    Refill:  0  . citalopram (CELEXA) 20 MG tablet    Sig: Take 1 tablet (20 mg total) by mouth daily.    Dispense:  90 tablet    Refill:  0  . diazepam (VALIUM) 5 MG tablet    Sig: Take 1 tablet (5 mg total) by mouth daily as needed for anxiety or muscle spasms.    Dispense:  30 tablet    Refill:  0    Follow-up: Return in about 9 weeks (around 03/26/2021) for fasting.  An After Visit Summary was printed and given to the patient.  Rochel Brome, MD [facility] [phone]

## 2021-01-26 ENCOUNTER — Encounter: Payer: Self-pay | Admitting: Family Medicine

## 2021-01-28 ENCOUNTER — Ambulatory Visit: Payer: Medicare HMO | Admitting: Family Medicine

## 2021-01-28 ENCOUNTER — Ambulatory Visit: Payer: Medicare HMO

## 2021-02-01 ENCOUNTER — Telehealth: Payer: Self-pay

## 2021-02-01 NOTE — Progress Notes (Signed)
Chronic Care Management Pharmacy Assistant   Name: Olivia Cruz  MRN: [id number] DOB: [birth year]   ATTEMPTED TO REACH PATIENT X3  Reason for Encounter: Disease State call for HTN    Recent office visits:  01/22/2021: Dr. Tobie Poet (PCP) for RLS/ added Citalopram 20mg ., added diazapam 5mg .   Recent consult visits:  01/13/2021: Dr. Jefferson Fuel (Orthopaedics) / s/p (R) total knee replacement (DOS: 07/21/2020)  Hospital visits:  None noted since last CCM visit   Medications: Outpatient Encounter Medications as of 02/01/2021  Medication Sig   acetaminophen (TYLENOL) 325 MG tablet Take 500 mg by mouth every 6 (six) hours as needed.   benzonatate (TESSALON) 200 MG capsule Take 1 capsule (200 mg total) by mouth 3 (three) times daily as needed for cough.   budesonide (RHINOCORT AQUA) 32 MCG/ACT nasal spray Place 1 spray into both nostrils daily. Reported on 02/04/2016   Calcium Carbonate-Vitamin D (CALCIUM + D PO) Take 1,200 mg by mouth daily.   cholecalciferol (VITAMIN D3) 25 MCG (1000 UNIT) tablet Take 1,000 Units by mouth in the morning, at noon, and at bedtime.   ciclopirox (PENLAC) 8 % solution APPLY TO TOENAIL EVERY NIGHT FOR 7 NIGHTS, ON 8TH NIGHT WIPE WITH ALCOHOL AND REPEAT   citalopram (CELEXA) 20 MG tablet Take 1 tablet (20 mg total) by mouth daily.   diazepam (VALIUM) 5 MG tablet Take 1 tablet (5 mg total) by mouth daily as needed for anxiety or muscle spasms.   diclofenac (VOLTAREN) 75 MG EC tablet Take 1 tablet (75 mg total) by mouth 2 (two) times daily.   diclofenac Sodium (VOLTAREN) 1 % GEL    folic acid (FOLVITE) 1 MG tablet Take 1 tablet by mouth daily.   gabapentin (NEURONTIN) 300 MG capsule Take 300 mg by mouth in the morning, at noon, and at bedtime.   levothyroxine (SYNTHROID) 50 MCG tablet Take 1 tablet (50 mcg total) by mouth daily.   Magnesium 250 MG TABS Take 250 mg by mouth daily.   methotrexate (RHEUMATREX) 2.5 MG tablet Take 6 tablets (15 mg total) by mouth once  a week. Takes 8 tablets (20 mg) once a week   Multiple Vitamin (MULTIVITAMIN) capsule Take 1 capsule by mouth daily.   Multiple Vitamins-Minerals (HAIR SKIN AND NAILS FORMULA PO) Take by mouth daily.   nitrofurantoin, macrocrystal-monohydrate, (MACROBID) 100 MG capsule 1 po orally, post coitally   pantoprazole (PROTONIX) 40 MG tablet Take 1 tablet (40 mg total) by mouth daily.   rosuvastatin (CRESTOR) 10 MG tablet Take 1 tablet (10 mg total) by mouth daily.   valACYclovir (VALTREX) 500 MG tablet daily as needed.    No facility-administered encounter medications on file as of 02/01/2021.    Recent Office Vitals: BP Readings from Last 3 Encounters:  01/25/21 (!) 148/70  12/28/20 124/70  12/18/20 108/70   Pulse Readings from Last 3 Encounters:  12/28/20 100  12/18/20 80  12/02/20 82    Wt Readings from Last 3 Encounters:  01/25/21 160 lb 12.8 oz (72.9 kg)  12/28/20 161 lb 3.2 oz (73.1 kg)  12/18/20 157 lb 6.4 oz (71.4 kg)     Kidney Function Lab Results  Component Value Date/Time   CREATININE 0.71 12/14/2020 07:47 AM   CREATININE 0.70 09/15/2020 11:01 AM   CREATININE 0.71 02/18/2013 10:29 AM   GFRNONAA 88 09/15/2020 11:01 AM   GFRAA 102 09/15/2020 11:01 AM    BMP Latest Ref Rng & Units 12/14/2020 09/15/2020 09/10/2020  Glucose 65 - 99  mg/dL 92 108(H) 99  BUN 8 - 27 mg/dL 17 10 14   Creatinine 0.57 - 1.00 mg/dL 0.71 0.70 0.81  BUN/Creat Ratio 12 - 28 24 14 17   Sodium 134 - 144 mmol/L 141 138 137  Potassium 3.5 - 5.2 mmol/L 4.3 4.3 4.5  Chloride 96 - 106 mmol/L 105 102 101  CO2 20 - 29 mmol/L 19(L) - -  Calcium 8.7 - 10.3 mg/dL 9.8 10.0 10.4(H)     Current antihypertensive regimen: No medications noted for HTN    Adherence Review: Is the patient currently on ACE/ARB medication? No Does the patient have >5 day gap between last estimated fill dates? CPP to review    Star Rating Drugs:  Medication:   Last Fill: Day Supply Rosuvastatin 10mg   12/18/2020 90DS  [facility] to confirm last fill date. Scheduled for next fill on 02/24/2021.     Marcine Matar, [place] Clinical Pharmacist Assistant

## 2021-02-03 DIAGNOSIS — L301 Dyshidrosis [pompholyx]: Secondary | ICD-10-CM | POA: Diagnosis not present

## 2021-02-04 DIAGNOSIS — L600 Ingrowing nail: Secondary | ICD-10-CM | POA: Insufficient documentation

## 2021-02-04 DIAGNOSIS — M6702 Short Achilles tendon (acquired), left ankle: Secondary | ICD-10-CM | POA: Diagnosis not present

## 2021-02-04 DIAGNOSIS — M6788 Other specified disorders of synovium and tendon, other site: Secondary | ICD-10-CM | POA: Diagnosis not present

## 2021-02-04 HISTORY — DX: Ingrowing nail: L60.0

## 2021-02-10 DIAGNOSIS — Z96651 Presence of right artificial knee joint: Secondary | ICD-10-CM | POA: Diagnosis not present

## 2021-02-10 DIAGNOSIS — Z888 Allergy status to other drugs, medicaments and biological substances status: Secondary | ICD-10-CM | POA: Diagnosis not present

## 2021-02-10 DIAGNOSIS — Z885 Allergy status to narcotic agent status: Secondary | ICD-10-CM | POA: Diagnosis not present

## 2021-02-10 DIAGNOSIS — M25462 Effusion, left knee: Secondary | ICD-10-CM | POA: Diagnosis not present

## 2021-02-10 DIAGNOSIS — Z880 Allergy status to penicillin: Secondary | ICD-10-CM | POA: Diagnosis not present

## 2021-02-10 DIAGNOSIS — Z882 Allergy status to sulfonamides status: Secondary | ICD-10-CM | POA: Diagnosis not present

## 2021-02-10 DIAGNOSIS — M25562 Pain in left knee: Secondary | ICD-10-CM | POA: Diagnosis not present

## 2021-02-10 DIAGNOSIS — M1712 Unilateral primary osteoarthritis, left knee: Secondary | ICD-10-CM | POA: Diagnosis not present

## 2021-02-11 DIAGNOSIS — R5383 Other fatigue: Secondary | ICD-10-CM | POA: Diagnosis not present

## 2021-02-11 DIAGNOSIS — M25561 Pain in right knee: Secondary | ICD-10-CM | POA: Diagnosis not present

## 2021-02-11 DIAGNOSIS — R29898 Other symptoms and signs involving the musculoskeletal system: Secondary | ICD-10-CM | POA: Diagnosis not present

## 2021-02-11 DIAGNOSIS — R059 Cough, unspecified: Secondary | ICD-10-CM | POA: Diagnosis not present

## 2021-02-11 DIAGNOSIS — L4050 Arthropathic psoriasis, unspecified: Secondary | ICD-10-CM | POA: Diagnosis not present

## 2021-02-11 DIAGNOSIS — R519 Headache, unspecified: Secondary | ICD-10-CM | POA: Diagnosis not present

## 2021-02-11 DIAGNOSIS — M25562 Pain in left knee: Secondary | ICD-10-CM | POA: Diagnosis not present

## 2021-02-11 DIAGNOSIS — Z79899 Other long term (current) drug therapy: Secondary | ICD-10-CM | POA: Diagnosis not present

## 2021-02-12 ENCOUNTER — Encounter: Payer: Self-pay | Admitting: Nurse Practitioner

## 2021-02-12 ENCOUNTER — Other Ambulatory Visit: Payer: Self-pay

## 2021-02-12 ENCOUNTER — Ambulatory Visit (INDEPENDENT_AMBULATORY_CARE_PROVIDER_SITE_OTHER): Payer: Medicare HMO | Admitting: Nurse Practitioner

## 2021-02-12 VITALS — BP 132/78 | HR 70 | Temp 97.4°F | Ht 60.5 in | Wt 158.0 lb

## 2021-02-12 DIAGNOSIS — W19XXXA Unspecified fall, initial encounter: Secondary | ICD-10-CM | POA: Diagnosis not present

## 2021-02-12 DIAGNOSIS — Z96651 Presence of right artificial knee joint: Secondary | ICD-10-CM | POA: Diagnosis not present

## 2021-02-12 DIAGNOSIS — R296 Repeated falls: Secondary | ICD-10-CM

## 2021-02-12 DIAGNOSIS — S51011A Laceration without foreign body of right elbow, initial encounter: Secondary | ICD-10-CM

## 2021-02-12 NOTE — Progress Notes (Signed)
Acute Office Visit  Subjective:    Patient ID: Olivia Cruz, female    DOB: [birth year], 70 y.o.   MRN: [id number]  Chief Complaint  Patient presents with   Fall, elbow abrasion    HPI Olivia Cruz is a 70 year old Caucasian female that presents s/p fall with right elbow and wrist abrasion 6-days ago.She tells me flip-flop got caught on step going to laundry room causing her to fall backwards, land on right elbow, and hit her head. She denies LOC, headache, nausea, or vomiting. Treatment has included antibiotic ointment and bandages to wounds. She is being seen today in the office after recommendation of rheumatologist  that she follow-up with PCP office. Olivia Cruz tells me that she has experienced increased falls since having right total knee replacement in Nov 2021. States she has decreased ROM to right knee and impaired balance. She was unable to take post-op NSAIDS due to treatment of psoriatic arthritis treated with methotrexate. States she completed physical therapy post-op and was given balance exercises to perform.    Past Medical History:  Diagnosis Date   Allergic rhinoconjunctivitis    Anxiety    Asthma    Depression    Diverticulitis    GERD (gastroesophageal reflux disease)    Glaucoma    Hypothyroid    Insomnia    Laryngopharyngeal reflux (LPR)    Migraines    Osteoarthritis    Psoriatic arthritis ([place])    Skin cancer    STD (sexually transmitted disease)    HSV II    Past Surgical History:  Procedure Laterality Date   BELPHAROPTOSIS REPAIR     BREAST SURGERY Left    times 2   COLONOSCOPY  06/03/2013   Moderate predominantly sigmoid diverticulosis. Small interal hemorroids   FOOT SURGERY Right    Removed bone spur   GANGLION CYST EXCISION Left    foot   HEMORRHOID SURGERY     SKIN CANCER EXCISION     TOTAL KNEE ARTHROPLASTY Right 07/21/2020   UPPER GI ENDOSCOPY  03/23/2016   Mild gastritis, gastric polyps bxbenign squamous mucosa with no abnormaility,  fundic glad poly in setting of mild chron gastritis.    Family History  Problem Relation Age of Onset   Breast cancer Sister        lung   Cancer Maternal Grandmother    Multiple births Maternal Grandmother    Multiple births Paternal Grandmother    Cancer Paternal Grandmother    Thyroid disease Mother    Heart failure Mother    Heart disease Mother    Hyperlipidemia Mother    Hypertension Mother    Stroke Mother    Depression Mother    Parkinson's disease Father    Glaucoma Father    Diverticulitis Father     Social History   Socioeconomic History   Marital status: Married    Spouse name: Not on file   Number of children: Not on file   Years of education: Not on file   Highest education level: Not on file  Occupational History   Not on file  Tobacco Use   Smoking status: Former    Pack years: 0.00    Types: Cigarettes    Quit date: 1986    Years since quitting: 36.5   Smokeless tobacco: Never  Vaping Use   Vaping Use: Never used  Substance and Sexual Activity   Alcohol use: No    Alcohol/week: 0.0 standard drinks   Drug use: No  Sexual activity: Yes    Partners: Male    Comment: husband vasectomy  Other Topics Concern   Not on file  Social History Narrative   Not on file   Social Determinants of Health   Financial Resource Strain: Low Risk    Difficulty of Paying Living Expenses: Not hard at all  Food Insecurity: No Food Insecurity   Worried About Charity fundraiser in the Last Year: Never true   [place] in the Last Year: Never true  Transportation Needs: No Transportation Needs   Lack of Transportation (Medical): No   Lack of Transportation (Non-Medical): No  Physical Activity: Sufficiently Active   Days of Exercise per Week: 5 days   Minutes of Exercise per Session: 30 min  Stress: No Stress Concern Present   Feeling of Stress : Only a little  Social Connections: Engineer, building services of Communication with Friends and  Family: More than three times a week   Frequency of Social Gatherings with Friends and Family: More than three times a week   Attends Religious Services: More than 4 times per year   Active Member of [employer] or Organizations: Yes   Attends Music therapist: More than 4 times per year   Marital Status: Married  Human resources officer Violence: Not At Risk   Fear of Current or Ex-Partner: No   Emotionally Abused: No   Physically Abused: No   Sexually Abused: No    Outpatient Medications Prior to Visit  Medication Sig Dispense Refill   Adalimumab 40 MG/0.4ML PNKT Inject into the skin.     lidocaine (XYLOCAINE) 1 % (with preservative) injection by Infiltration route.     acetaminophen (TYLENOL) 325 MG tablet Take 500 mg by mouth every 6 (six) hours as needed.     budesonide (RHINOCORT AQUA) 32 MCG/ACT nasal spray Place 1 spray into both nostrils daily. Reported on 02/04/2016     Calcium Carbonate-Vitamin D (CALCIUM + D PO) Take 1,200 mg by mouth daily.     cholecalciferol (VITAMIN D3) 25 MCG (1000 UNIT) tablet Take 1,000 Units by mouth in the morning, at noon, and at bedtime.     ciclopirox (PENLAC) 8 % solution APPLY TO TOENAIL EVERY NIGHT FOR 7 NIGHTS, ON 8TH NIGHT WIPE WITH ALCOHOL AND REPEAT     citalopram (CELEXA) 20 MG tablet Take 1 tablet (20 mg total) by mouth daily. 90 tablet 0   diclofenac Sodium (VOLTAREN) 1 % GEL      folic acid (FOLVITE) 1 MG tablet Take 1 tablet by mouth daily.     gabapentin (NEURONTIN) 300 MG capsule Take 300 mg by mouth in the morning, at noon, and at bedtime.     levothyroxine (SYNTHROID) 50 MCG tablet Take 1 tablet (50 mcg total) by mouth daily. 90 tablet 1   Magnesium 250 MG TABS Take 250 mg by mouth daily.     methotrexate (RHEUMATREX) 2.5 MG tablet Take 6 tablets (15 mg total) by mouth once a week. Takes 8 tablets (20 mg) once a week 1 tablet 0   Multiple Vitamin (MULTIVITAMIN) capsule Take 1 capsule by mouth daily.     Multiple Vitamins-Minerals  (HAIR SKIN AND NAILS FORMULA PO) Take by mouth daily.     nitrofurantoin, macrocrystal-monohydrate, (MACROBID) 100 MG capsule 1 po orally, post coitally 30 capsule 2   pantoprazole (PROTONIX) 40 MG tablet Take 1 tablet (40 mg total) by mouth daily. 90 tablet 1   rosuvastatin (CRESTOR) 10  MG tablet Take 1 tablet (10 mg total) by mouth daily. 90 tablet 0   valACYclovir (VALTREX) 500 MG tablet daily as needed.      benzonatate (TESSALON) 200 MG capsule Take 1 capsule (200 mg total) by mouth 3 (three) times daily as needed for cough. 30 capsule 0   diazepam (VALIUM) 5 MG tablet Take 1 tablet (5 mg total) by mouth daily as needed for anxiety or muscle spasms. 30 tablet 0   diclofenac (VOLTAREN) 75 MG EC tablet Take 1 tablet (75 mg total) by mouth 2 (two) times daily. 1 tablet 0   No facility-administered medications prior to visit.    Allergies  Allergen Reactions   Codeine Anaphylaxis and Nausea Only   Ambien [Zolpidem Tartrate] Other (See Comments)    Memory loss per patient   Augmentin [Amoxicillin-Pot Clavulanate] Nausea And Vomiting   Cyproheptadine Hcl Other (See Comments)    Confusion   Dilaudid [Hydromorphone]     hallucinations   Hydrocodone Other (See Comments)    anaphylaxis   Lisinopril    Sulfa Antibiotics Other (See Comments)    hives   Tramadol    Other Rash    Paper tape caused rash    Review of Systems  Constitutional: Negative.   HENT: Negative.    Eyes: Negative.   Respiratory: Negative.    Cardiovascular: Negative.   Endocrine: Negative.   Genitourinary: Negative.   Musculoskeletal:  Positive for arthralgias, gait problem and joint swelling. Negative for back pain and myalgias.       Right knee decreased ROM  Skin:  Positive for wound (Abrasion right elbow and anterior wrist). Negative for rash.  Allergic/Immunologic: Positive for immunocompromised state.  Neurological:        Impaired balance  Hematological: Negative.   Psychiatric/Behavioral: Negative.         Objective:    Physical Exam Vitals reviewed.  Constitutional:      Appearance: Normal appearance. She is normal weight.  Pulmonary:     Effort: Pulmonary effort is normal.  Skin:    General: Skin is warm and dry.     Capillary Refill: Capillary refill takes less than 2 seconds.          Comments: Healing abrasion mid-line right posterior elbow, approximately 7 cm in-length. Edges well-approximated, no erythema,edema, odor, or drainage present  Healing abrasion to right anterior wrist approximately 2 cm in diameter. No erythema, edema, drainage, or odor present  Neurological:     General: No focal deficit present.     Mental Status: She is alert and oriented to person, place, and time.  Psychiatric:        Mood and Affect: Mood normal.        Behavior: Behavior normal.    BP 132/78 (BP Location: Left Arm, Patient Position: Sitting)   Pulse 70   Temp (!) 97.4 F (36.3 C) (Temporal)   Ht 5' 0.5" (1.537 m)   Wt 158 lb (71.7 kg)   LMP 08/22/2005   SpO2 98%   BMI 30.35 kg/m  Wt Readings from Last 3 Encounters:  02/12/21 158 lb (71.7 kg)  01/25/21 160 lb 12.8 oz (72.9 kg)  12/28/20 161 lb 3.2 oz (73.1 kg)    Health Maintenance Due  Topic Date Due   Zoster Vaccines- Shingrix (2 of 2) 07/13/2018    Lab Results  Component Value Date   TSH 3.990 09/15/2020   Lab Results  Component Value Date   WBC 6.3 12/14/2020  HGB 14.2 12/14/2020   HCT 42.6 12/14/2020   MCV 90 12/14/2020   PLT 302 12/14/2020   Lab Results  Component Value Date   NA 141 12/14/2020   K 4.3 12/14/2020   CO2 19 (L) 12/14/2020   GLUCOSE 92 12/14/2020   BUN 17 12/14/2020   CREATININE 0.71 12/14/2020   BILITOT 0.2 12/14/2020   ALKPHOS 102 12/14/2020   AST 24 12/14/2020   ALT 19 12/14/2020   PROT 6.9 12/14/2020   ALBUMIN 4.5 12/14/2020   CALCIUM 9.8 12/14/2020   EGFR 91 12/14/2020   Lab Results  Component Value Date   CHOL 228 (H) 12/14/2020   Lab Results  Component Value Date    HDL 52 12/14/2020   Lab Results  Component Value Date   LDLCALC 138 (H) 12/14/2020   Lab Results  Component Value Date   TRIG 210 (H) 12/14/2020   Lab Results  Component Value Date   CHOLHDL 4.4 12/14/2020   Lab Results  Component Value Date   HGBA1C 5.7 (H) 12/11/2019     Assessment & Plan:   1. Fall, initial encounter -fall precautions -perform core strengthening and balance exercises as directed  2. Laceration of right elbow, initial encounter -apply antibacterial ointment -bandage as needed -monitor for infection  3. Frequent falls- -fall precautions  4. Status post right knee replacement -follow-up with orthopedist as scheduled  -continue core strengthening and balance exercises as directed  I, Lauren Peterson [place] as a scribe for [employer], NP.,have documented all relevant documentation on the behalf of Rip Harbour, NP,as directed by  Rip Harbour, NP while in the presence of Rip Harbour, NP.   I, Rip Harbour, NP, have reviewed all documentation for this visit. The documentation on 02/12/21 for the exam, diagnosis, procedures, and orders are all accurate and complete.    Follow-up: PRN  Signed, Jerrell Belfast, DNP

## 2021-02-12 NOTE — Patient Instructions (Addendum)
Fall precautions Monitor abrasion to right elbow for infection Follow-up as needed   Core Strength Exercises Ask your health care provider which exercises are safe for you. Do exercises exactly as told by your health care provider and adjust them as directed. It is normal to feel mild stretching, pulling, tightness, or discomfort as you do these exercises. Stop right away if you feel sudden pain or your pain gets worse. Do not begin these exercises until told by your health care provider. Benefits of core strength exercises Core exercises help to build strength in the muscles between your ribs and your hips (abdominal muscles). These muscles help to support your body and keep your spine stable. It isimportant to maintain strength in your core to prevent injury and pain. Some activities, such as yoga and Pilates, can help to strengthen core muscles. You can also strengthen core muscles with exercises at home. It is important totalk to your health care provider before you start a new exercise routine. Core strength exercises can: Reduce back pain. Help to rebuild strength after a back or spine injury. Help to prevent injury during physical activity, especially injuries to the back, hips, and knees. How to do core strength exercises Repeat these exercises 10-15 times, or until you are tired. Stop if you feel any pain while doing these exercises. Contact your health care provider if yourpain continues or gets worse while doing or after doing core strength exercises. For strength exercises that are done on the floor, use a padded yoga mat or anexercise mat. Bridging  Lie on your back on a firm surface with your knees bent and your feet flat on the floor. Raise your hips so that your knees, hips, and shoulders together form a straight line. Do not excessively arch your back. Keep your abdominal muscles tight. Hold this position for 3-5 seconds. Slowly lower your hips to the starting position. Let  your muscles relax completely between repetitions.  Single-leg bridge Lie on your back on a firm surface with your knees bent and your feet flat on the floor. Raise your hips so that your knees, hips, and shoulders together form a straight line. Do not excessively arch your back. Keep your abdominal muscles tight. Lift one foot off the floor while maintaining alignment in your knees, hips, and shoulders. Then, completely straighten the lifted leg. Hold this position for 3-5 seconds. Put the straight leg back down in the bent position. Slowly lower your hips to the starting position. Repeat these steps using your other leg. Side bridge Lie on your side with your knees bent. Prop yourself up on the elbow that is near the floor. Using your abdominal muscles and the elbow you are propped up on, raise your body off the floor. Raise your hip so that your shoulder, hip, and foot together form a straight line. Hold this position for 10 seconds. Keep your head and neck raised and away from your shoulder (in their normal, neutral position). Keep your abdominal muscles tight. Slowly lower your hip to the starting position. Repeat and try to hold this position longer, working your way up to 30 seconds. Abdominal crunch Lie on your back on a firm surface. Bend your knees and keep your feet flat on the floor. Cross your arms over your chest. Without bending your neck, tip your chin slightly toward your chest. Tighten your abdominal muscles as you lift your chest just high enough to lift your shoulder blades off the floor. Do not hold your breath.  You can do this with short lifts or long lifts. Slowly return to the starting position. Bird dog  Get on your hands and knees, with your legs shoulder-width apart and your arms under your shoulders. Keep your back straight. Tighten your abdominal muscles. Raise one of your legs off the floor and straighten it. Try to keep it parallel to the floor. Slowly lower  your leg to the starting position. Raise one of your arms off the floor and straighten it. Try to keep it parallel to the floor. Slowly lower your arm to the starting position. Repeat with the other arm and leg. If possible, try raising a leg and an arm at the same time, on opposite sides of the body. For example, raise your left hand and your right leg.  Olivia Cruz on your belly. Prop up your body onto your forearms and your feet, keeping your legs straight. Your body should make a straight line between your shoulders and feet. Hold this position for 10 seconds while keeping your abdominal muscles tight. Lower your body to the starting position. Repeat and try to hold this position longer, working your way up to 30 seconds.  Cross-core strengthening Stand with your feet shoulder-width apart. Hold a ball out in front of you. Keep your arms straight. Tighten your abdominal muscles and slowly rotate at your waist from side to side. Keep your feet flat. Once you are comfortable, try repeating this exercise with a heavier ball. Top core strengthening Stand about 18 inches (46 cm) out from a wall, with your back to the wall. Keep your feet flat and shoulder-width apart. Tighten your abdominal muscles. Bend your hips and knees. Slowly reach between your legs to touch the wall behind you. Slowly stand back up. Raise your arms over your head and reach behind you. Return to the starting position. General tips Do not do any exercises that cause pain. If you have pain while exercising, talk to your health care provider. Always stretch before and after doing these exercises. This can help prevent injury. Maintain a healthy weight. Ask your health care provider what weight is healthy for you. Contact a health care provider if: You have back pain that gets worse or does not go away. You feel pain while doing core strength exercises. Get help right away if: You have severe pain that does not get  better with medicine. Summary Core exercises help to build strength in the muscles between your ribs and your waist. Core muscles help to support your body and keep your spine stable. Some activities, such as yoga and Pilates, can help to strengthen core muscles. Core strength exercises can help back pain and can prevent injury. Stop if you feel any pain while doing core strength exercises. This information is not intended to replace advice given to you by your health care provider. Make sure you discuss any questions you have with your healthcare provider. Document Revised: 05/05/2020 Document Reviewed: 05/05/2020 Elsevier Patient Education  2022 [place].  Abrasion An abrasion is a cut or a scrape on your skin. You must take care of your woundso germs do not get in it and cause infection. What are the causes? This condition is caused by rubbing your skin on something or falling on a surface, such as the ground. When your skin rubs on something, some layers ofskin may rub off. What are the signs or symptoms? A cut or a scrape. Bleeding. A red or pink spot. A bruise under your  wound. How is this treated? This condition may be treated with: Cleaning your wound. Putting ointment on your wound. Putting a bandage on your wound. Getting a tetanus shot. Follow these instructions at home: Your doctor may tell you to do these things: Medicines Take or use over-the-counter and prescription medicines only as told by your doctor. If you were prescribed an antibiotic medicine, use it as told by your doctor. Do not stop using it even if you start to feel better. Keep your wound clean Clean your wound 1 or 2 times a day or as often told by your doctor. To do this: Wash your hands for at least 20 seconds with mild soap and water. Do this before and after you clean your wound. Wash your wound with mild soap and water. Rinse off the soap. Pat your wound with a clean towel to dry it. Do not  rub your wound. Keep your bandage clean and dry. Take it off and change it as told by your doctor. You may have to change your bandage one or more times a day, or as told by your doctor. Watch for signs of infection Check your wound every day for signs of infection. Check for: A red streak that goes away from your wound. Other redness. Swelling or more pain. Warmth. Blood, fluid, pus, or a bad smell. Treat pain and swelling  If told, put ice on the injured area. To do this: Put ice in a plastic bag. Place a towel between your skin and the bag. Leave the ice on for 20 minutes, 2-3 times a day. Take off the ice if your skin turns bright red. This is very important. If you cannot feel pain, heat, or cold, you have a greater risk of damage to the area. If you can, raise the injured area above the level of your heart while you are sitting or lying down.  General instructions Do not take baths, swim, or use a hot tub. Ask your doctor about taking showers or sponge baths. Keep all follow-up visits. Contact a doctor if: You had a tetanus shot, and you have any of these where the needle went in: Swelling. Very bad pain. Redness. Bleeding. You have a lot of pain, and medicine does not help. You have a fever. You have any of these signs of infection in your wound: Redness, swelling, or more pain. Blood, fluid, pus, or a bad smell. Warmth. Get help right away if: You have a red streak going away from your wound. Summary An abrasion is a cut or a scrape on your skin. Take care of your wound so germs do not get in it. Clean your wound 1 or 2 times a day or as often as told. Change your bandage as told and use medicines as told. Call your doctor if you have a fever or if you have redness, swelling, or more pain in your wound. Call your doctor if you have blood, fluid, pus, or a bad smell in your wound. Get help right away if you have a red streak going away from your wound. This  information is not intended to replace advice given to you by your health care provider. Make sure you discuss any questions you have with your healthcare provider. Document Revised: 11/07/2019 Document Reviewed: 11/07/2019 Elsevier Patient Education  2022 [place] Prevention in the Home, Adult Falls can cause injuries and can happen to people of all ages. There are many things you can do to  make your home safe and to help prevent falls. Ask forhelp when making these changes. What actions can I take to prevent falls? General Instructions Use good lighting in all rooms. Replace any light bulbs that burn out. Turn on the lights in dark areas. Use night-lights. Keep items that you use often in easy-to-reach places. Lower the shelves around your home if needed. Set up your furniture so you have a clear path. Avoid moving your furniture around. Do not have throw rugs or other things on the floor that can make you trip. Avoid walking on wet floors. If any of your floors are uneven, fix them. Add color or contrast paint or tape to clearly mark and help you see: Grab bars or handrails. First and last steps of staircases. Where the edge of each step is. If you use a stepladder: Make sure that it is fully opened. Do not climb a closed stepladder. Make sure the sides of the stepladder are locked in place. Ask someone to hold the stepladder while you use it. Know where your pets are when moving through your home. What can I do in the bathroom?     Keep the floor dry. Clean up any water on the floor right away. Remove soap buildup in the tub or shower. Use nonskid mats or decals on the floor of the tub or shower. Attach bath mats securely with double-sided, nonslip rug tape. If you need to sit down in the shower, use a plastic, nonslip stool. Install grab bars by the toilet and in the tub and shower. Do not use towel bars as grab bars. What can I do in the bedroom? Make sure that  you have a light by your bed that is easy to reach. Do not use any sheets or blankets for your bed that hang to the floor. Have a firm chair with side arms that you can use for support when you get dressed. What can I do in the kitchen? Clean up any spills right away. If you need to reach something above you, use a step stool with a grab bar. Keep electrical cords out of the way. Do not use floor polish or wax that makes floors slippery. What can I do with my stairs? Do not leave any items on the stairs. Make sure that you have a light switch at the top and the bottom of the stairs. Make sure that there are handrails on both sides of the stairs. Fix handrails that are broken or loose. Install nonslip stair treads on all your stairs. Avoid having throw rugs at the top or bottom of the stairs. Choose a carpet that does not hide the edge of the steps on the stairs. Check carpeting to make sure that it is firmly attached to the stairs. Fix carpet that is loose or worn. What can I do on the outside of my home? Use bright outdoor lighting. Fix the edges of walkways and driveways and fix any cracks. Remove anything that might make you trip as you walk through a door, such as a raised step or threshold. Trim any bushes or trees on paths to your home. Check to see if handrails are loose or broken and that both sides of all steps have handrails. Install guardrails along the edges of any raised decks and porches. Clear paths of anything that can make you trip, such as tools or rocks. Have leaves, snow, or ice cleared regularly. Use sand or salt on paths during winter. Clean up  any spills in your garage right away. This includes grease or oil spills. What other actions can I take? Wear shoes that: Have a low heel. Do not wear high heels. Have rubber bottoms. Feel good on your feet and fit well. Are closed at the toe. Do not wear open-toe sandals. Use tools that help you move around if needed.  These include: Canes. Walkers. Scooters. Crutches. Review your medicines with your doctor. Some medicines can make you feel dizzy. This can increase your chance of falling. Ask your doctor what else you can do to help prevent falls. Where to find more information Centers for Disease Control and Prevention, [employer]: http://www.wolf.info/ National Institute on Aging: http://kim-miller.com/ Contact a doctor if: You are afraid of falling at home. You feel weak, drowsy, or dizzy at home. You fall at home. Summary There are many simple things that you can do to make your home safe and to help prevent falls. Ways to make your home safe include removing things that can make you trip and installing grab bars in the bathroom. Ask for help when making these changes in your home. This information is not intended to replace advice given to you by your health care provider. Make sure you discuss any questions you have with your healthcare provider. Document Revised: 03/11/2020 Document Reviewed: 03/11/2020 Elsevier Patient Education  [place].

## 2021-02-18 DIAGNOSIS — H25813 Combined forms of age-related cataract, bilateral: Secondary | ICD-10-CM | POA: Diagnosis not present

## 2021-02-18 DIAGNOSIS — H43813 Vitreous degeneration, bilateral: Secondary | ICD-10-CM | POA: Diagnosis not present

## 2021-02-18 DIAGNOSIS — H2513 Age-related nuclear cataract, bilateral: Secondary | ICD-10-CM | POA: Diagnosis not present

## 2021-02-18 DIAGNOSIS — R519 Headache, unspecified: Secondary | ICD-10-CM | POA: Diagnosis not present

## 2021-02-18 DIAGNOSIS — D3120 Benign neoplasm of unspecified retina: Secondary | ICD-10-CM | POA: Diagnosis not present

## 2021-03-02 ENCOUNTER — Telehealth: Payer: Self-pay

## 2021-03-02 NOTE — Progress Notes (Signed)
Chronic Care Management Pharmacy Assistant   Name: Olivia Cruz  MRN: [id number] DOB: [birth year]   Reason for Encounter: Question about medication and Patient Assistance for Humira.    Recent office visits:  02/12/21- Jerrell Belfast- PCP- Seen following fall. Started diclofenac gel. Follow up with orthopedist. Follow up PRN with PCP.   Recent consult visits:  02/26/21- Dr. Joselyn Arrow- Rheumatology- no information available.  02/18/21- Dr. Charlean Sanfilippo- Ophthalmology- Seen for headaches and blurry vision. Recommended temporal artery biopsy and continuation of steroids. Advised patient should be on omeprazole while taking steroids. 02/16/21- Dr. Joselyn Arrow- Rheumatology- No information available 02/11/21- Dr. Joselyn Arrow- Rheumatology- Ordered ESR, CRP and started prednisone 60 mg daily. 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 Follow up in 1 month. 02/10/21- Dr. Jonathon Jordan- Orthopedics- no information available.  02/04/21- Dr. Clide Dales- Podiatry- Bilateral great toenail pain. Recommended bilateral partial matrixectomy lateral borders. Follow up in 3 weeks for procedure.   Hospital visits:  None in previous 6 months  Medications: Outpatient Encounter Medications as of 03/02/2021  Medication Sig   acetaminophen (TYLENOL) 325 MG tablet Take 500 mg by mouth every 6 (six) hours as needed.   Adalimumab 40 MG/0.4ML PNKT Inject into the skin.   budesonide (RHINOCORT AQUA) 32 MCG/ACT nasal spray Place 1 spray into both nostrils daily. Reported on 02/04/2016   Calcium Carbonate-Vitamin D (CALCIUM + D PO) Take 1,200 mg by mouth daily.   cholecalciferol (VITAMIN D3) 25 MCG (1000 UNIT) tablet Take 1,000 Units by mouth in the morning, at noon, and at bedtime.   ciclopirox (PENLAC) 8 % solution APPLY TO TOENAIL EVERY NIGHT FOR 7 NIGHTS, ON 8TH NIGHT WIPE WITH ALCOHOL AND REPEAT   citalopram (CELEXA) 20 MG tablet Take 1 tablet (20 mg total) by mouth daily.   diclofenac Sodium (VOLTAREN) 1 % GEL     folic acid (FOLVITE) 1 MG tablet Take 1 tablet by mouth daily.   gabapentin (NEURONTIN) 300 MG capsule Take 300 mg by mouth in the morning, at noon, and at bedtime.   levothyroxine (SYNTHROID) 50 MCG tablet Take 1 tablet (50 mcg total) by mouth daily.   lidocaine (XYLOCAINE) 1 % (with preservative) injection by Infiltration route.   Magnesium 250 MG TABS Take 250 mg by mouth daily.   methotrexate (RHEUMATREX) 2.5 MG tablet Take 6 tablets (15 mg total) by mouth once a week. Takes 8 tablets (20 mg) once a week   Multiple Vitamin (MULTIVITAMIN) capsule Take 1 capsule by mouth daily.   Multiple Vitamins-Minerals (HAIR SKIN AND NAILS FORMULA PO) Take by mouth daily.   nitrofurantoin, macrocrystal-monohydrate, (MACROBID) 100 MG capsule 1 po orally, post coitally   pantoprazole (PROTONIX) 40 MG tablet Take 1 tablet (40 mg total) by mouth daily.   rosuvastatin (CRESTOR) 10 MG tablet Take 1 tablet (10 mg total) by mouth daily.   valACYclovir (VALTREX) 500 MG tablet daily as needed.    No facility-administered encounter medications on file as of 03/02/2021.    Patient left message asking if she could take Alendronate with levothyroxine. Discussed with Emeterio Reeve and informed patient that she should take Alendronate first then levothyroxine 30 minutes later. Patient verbalized understanding.  Patient also discussed cost concern for Humira. Patient assistance application for Humira has been completed on behalf of the patient. Patient prefers to sign in the office.  Patient aware she will need to provide requested income verification to be sent the manufacturer and take form to her rheumatologist for her  signature.    Sherre Poot, CPP notified  Margaretmary Dys, [place] Pharm[facility] Assistant [phone]

## 2021-03-04 ENCOUNTER — Other Ambulatory Visit: Payer: Self-pay | Admitting: Family Medicine

## 2021-03-04 DIAGNOSIS — R519 Headache, unspecified: Secondary | ICD-10-CM | POA: Diagnosis not present

## 2021-03-04 DIAGNOSIS — H538 Other visual disturbances: Secondary | ICD-10-CM | POA: Diagnosis not present

## 2021-03-04 DIAGNOSIS — M316 Other giant cell arteritis: Secondary | ICD-10-CM | POA: Diagnosis not present

## 2021-03-04 DIAGNOSIS — D3120 Benign neoplasm of unspecified retina: Secondary | ICD-10-CM | POA: Diagnosis not present

## 2021-03-04 MED ORDER — DIAZEPAM 5 MG PO TABS: ORAL_TABLET | ORAL | 0 refills | Status: DC

## 2021-03-06 ENCOUNTER — Other Ambulatory Visit: Payer: Self-pay | Admitting: Nurse Practitioner

## 2021-03-06 DIAGNOSIS — R051 Acute cough: Secondary | ICD-10-CM | POA: Diagnosis not present

## 2021-03-06 DIAGNOSIS — Z20828 Contact with and (suspected) exposure to other viral communicable diseases: Secondary | ICD-10-CM | POA: Diagnosis not present

## 2021-03-06 DIAGNOSIS — J040 Acute laryngitis: Secondary | ICD-10-CM | POA: Diagnosis not present

## 2021-03-06 DIAGNOSIS — R059 Cough, unspecified: Secondary | ICD-10-CM

## 2021-03-06 MED ORDER — PROMETHAZINE-DM 6.25-15 MG/5ML PO SYRP: 5.0000 mL | ORAL_SOLUTION | Freq: Four times a day (QID) | ORAL | 0 refills | Status: DC | PRN

## 2021-03-08 ENCOUNTER — Ambulatory Visit (INDEPENDENT_AMBULATORY_CARE_PROVIDER_SITE_OTHER): Payer: Medicare HMO | Admitting: Family Medicine

## 2021-03-08 ENCOUNTER — Encounter: Payer: Self-pay | Admitting: Family Medicine

## 2021-03-08 VITALS — BP 110/68 | HR 88 | Temp 97.4°F | Resp 16 | Ht 60.5 in | Wt 158.0 lb

## 2021-03-08 DIAGNOSIS — J208 Acute bronchitis due to other specified organisms: Secondary | ICD-10-CM

## 2021-03-08 DIAGNOSIS — J4521 Mild intermittent asthma with (acute) exacerbation: Secondary | ICD-10-CM | POA: Diagnosis not present

## 2021-03-08 LAB — POC COVID19 BINAXNOW: SARS Coronavirus 2 Ag: NEGATIVE

## 2021-03-08 MED ORDER — CEFTRIAXONE SODIUM 1 G IJ SOLR
1.0000 g | Freq: Once | INTRAMUSCULAR | Status: AC
  Administered 2021-03-08: 1 g via INTRAMUSCULAR

## 2021-03-08 MED ORDER — TRIAMCINOLONE ACETONIDE 40 MG/ML IJ SUSP
80.0000 mg | Freq: Once | INTRAMUSCULAR | Status: AC
  Administered 2021-03-08: 80 mg via INTRAMUSCULAR

## 2021-03-08 MED ORDER — ALBUTEROL SULFATE HFA 108 (90 BASE) MCG/ACT IN AERS: 2.0000 | INHALATION_SPRAY | Freq: Four times a day (QID) | RESPIRATORY_TRACT | 2 refills | Status: DC | PRN

## 2021-03-08 NOTE — Progress Notes (Signed)
Acute Office Visit  Subjective:    Patient ID: Olivia Cruz, female    DOB: 1950/08/30, 70 y.o.   MRN: [id number]  Chief Complaint  Patient presents with   Cough    HPI Cough: Patient complains of dyspnea, nasal congestion, and productive cough with sputum described as green.  Symptoms began 4 day ago.  The cough is non-productive, without wheezing, dyspnea or hemoptysis, productive of green/yellow sputum and is aggravated by nothing Associated symptoms include:shortness of breath, sputum production, and wheezing. Patient does not have new pets. Patient does have a history of asthma. Patient does have a history of environmental allergens. Patient has not recent travel. Patient does have a history of smoking. Patient  has previous Chest X-ray (normal.) Patient has not had a PPD done.   Past Medical History:  Diagnosis Date   Allergic rhinoconjunctivitis    Anxiety    Asthma    Depression    Diverticulitis    GERD (gastroesophageal reflux disease)    Glaucoma    Hypothyroid    Insomnia    Laryngopharyngeal reflux (LPR)    Migraines    Osteoarthritis    Psoriatic arthritis ([place])    Skin cancer    STD (sexually transmitted disease)    HSV II    Past Surgical History:  Procedure Laterality Date   BELPHAROPTOSIS REPAIR     BREAST SURGERY Left    times 2   COLONOSCOPY  06/03/2013   Moderate predominantly sigmoid diverticulosis. Small interal hemorroids   FOOT SURGERY Right    Removed bone spur   GANGLION CYST EXCISION Left    foot   HEMORRHOID SURGERY     SKIN CANCER EXCISION     TOTAL KNEE ARTHROPLASTY Right 07/21/2020   UPPER GI ENDOSCOPY  03/23/2016   Mild gastritis, gastric polyps bxbenign squamous mucosa with no abnormaility, fundic glad poly in setting of mild chron gastritis.    Family History  Problem Relation Age of Onset   Breast cancer Sister        lung   Cancer Maternal Grandmother    Multiple births Maternal Grandmother    Multiple births  Paternal Grandmother    Cancer Paternal Grandmother    Thyroid disease Mother    Heart failure Mother    Heart disease Mother    Hyperlipidemia Mother    Hypertension Mother    Stroke Mother    Depression Mother    Parkinson's disease Father    Glaucoma Father    Diverticulitis Father     Social History   Socioeconomic History   Marital status: Married    Spouse name: Not on file   Number of children: Not on file   Years of education: Not on file   Highest education level: Not on file  Occupational History   Not on file  Tobacco Use   Smoking status: Former    Types: Cigarettes    Quit date: 1986    Years since quitting: 36.5   Smokeless tobacco: Never  Vaping Use   Vaping Use: Never used  Substance and Sexual Activity   Alcohol use: No    Alcohol/week: 0.0 standard drinks   Drug use: No   Sexual activity: Yes    Partners: Male    Comment: husband vasectomy  Other Topics Concern   Not on file  Social History Narrative   Not on file   Social Determinants of Health   Financial Resource Strain: Low Risk  Difficulty of Paying Living Expenses: Not hard at all  Food Insecurity: No Food Insecurity   Worried About [place] in the Last Year: Never true   Ran Out of Food in the Last Year: Never true  Transportation Needs: No Transportation Needs   Lack of Transportation (Medical): No   Lack of Transportation (Non-Medical): No  Physical Activity: Sufficiently Active   Days of Exercise per Week: 5 days   Minutes of Exercise per Session: 30 min  Stress: No Stress Concern Present   Feeling of Stress : Only a little  Social Connections: Engineer, building services of Communication with Friends and Family: More than three times a week   Frequency of Social Gatherings with Friends and Family: More than three times a week   Attends Religious Services: More than 4 times per year   Active Member of [employer] or Organizations: Yes   Attends Arts administrator: More than 4 times per year   Marital Status: Married  Human resources officer Violence: Not At Risk   Fear of Current or Ex-Partner: No   Emotionally Abused: No   Physically Abused: No   Sexually Abused: No    Outpatient Medications Prior to Visit  Medication Sig Dispense Refill   acetaminophen (TYLENOL) 325 MG tablet Take 500 mg by mouth every 6 (six) hours as needed.     Adalimumab 40 MG/0.4ML PNKT Inject into the skin.     alendronate (FOSAMAX) 70 MG tablet Take by mouth.     budesonide (RHINOCORT AQUA) 32 MCG/ACT nasal spray Place 1 spray into both nostrils daily. Reported on 02/04/2016     Calcium Carbonate-Vitamin D (CALCIUM + D PO) Take 1,200 mg by mouth daily.     cefdinir (OMNICEF) 300 MG capsule Take 300 mg by mouth 2 (two) times daily.     cholecalciferol (VITAMIN D3) 25 MCG (1000 UNIT) tablet Take 1,000 Units by mouth in the morning, at noon, and at bedtime.     ciclopirox (PENLAC) 8 % solution APPLY TO TOENAIL EVERY NIGHT FOR 7 NIGHTS, ON 8TH NIGHT WIPE WITH ALCOHOL AND REPEAT     citalopram (CELEXA) 20 MG tablet Take 1 tablet (20 mg total) by mouth daily. 90 tablet 0   diazepam (VALIUM) 5 MG tablet One pill 30 minutes prior to mri. May repeat one pill in 30 minutes prn anxiety 5 tablet 0   diclofenac Sodium (VOLTAREN) 1 % GEL      folic acid (FOLVITE) 1 MG tablet Take 1 tablet by mouth daily.     gabapentin (NEURONTIN) 300 MG capsule Take 300 mg by mouth in the morning, at noon, and at bedtime.     levothyroxine (SYNTHROID) 50 MCG tablet Take 1 tablet (50 mcg total) by mouth daily. 90 tablet 1   lidocaine (XYLOCAINE) 1 % (with preservative) injection by Infiltration route.     Magnesium 250 MG TABS Take 250 mg by mouth daily.     Melatonin 5 MG LOZG Take by mouth.     methotrexate (RHEUMATREX) 2.5 MG tablet Take 6 tablets (15 mg total) by mouth once a week. Takes 8 tablets (20 mg) once a week 1 tablet 0   Multiple Vitamin (MULTIVITAMIN) capsule Take 1 capsule by mouth  daily.     Multiple Vitamins-Minerals (HAIR SKIN AND NAILS FORMULA PO) Take by mouth daily.     pantoprazole (PROTONIX) 40 MG tablet Take 1 tablet (40 mg total) by mouth daily. 90 tablet 1  predniSONE (DELTASONE) 2.5 MG tablet      predniSONE (DELTASONE) 20 MG tablet Take by mouth.     promethazine-dextromethorphan (PROMETHAZINE-DM) 6.25-15 MG/5ML syrup Take 5 mLs by mouth 4 (four) times daily as needed for cough. 118 mL 0   rosuvastatin (CRESTOR) 10 MG tablet Take 1 tablet (10 mg total) by mouth daily. 90 tablet 0   valACYclovir (VALTREX) 500 MG tablet daily as needed.      nitrofurantoin, macrocrystal-monohydrate, (MACROBID) 100 MG capsule 1 po orally, post coitally 30 capsule 2   No facility-administered medications prior to visit.    Allergies  Allergen Reactions   Codeine Anaphylaxis and Nausea Only   Ambien [Zolpidem Tartrate] Other (See Comments)    Memory loss per patient   Augmentin [Amoxicillin-Pot Clavulanate] Nausea And Vomiting   Cyproheptadine Hcl Other (See Comments)    Confusion   Dilaudid [Hydromorphone]     hallucinations   Hydrocodone Other (See Comments)    anaphylaxis   Lisinopril    Sulfa Antibiotics Other (See Comments)    hives   Tramadol    Other Rash    Paper tape caused rash    Review of Systems  Constitutional:  Positive for fatigue. Negative for chills and fever.  HENT:  Positive for rhinorrhea. Negative for congestion and sore throat.   Respiratory:  Positive for cough. Negative for shortness of breath.   Cardiovascular:  Negative for chest pain.  Gastrointestinal:  Negative for abdominal pain, constipation, diarrhea, nausea and vomiting.  Genitourinary:  Negative for dysuria and urgency.  Musculoskeletal:  Negative for back pain and myalgias.  Neurological:  Positive for headaches. Negative for light-headedness.  Psychiatric/Behavioral:  Negative for dysphoric mood. The patient is not nervous/anxious.       Objective:    Physical  Exam Vitals reviewed.  Constitutional:      Appearance: Normal appearance.  HENT:     Right Ear: Tympanic membrane normal.     Left Ear: Tympanic membrane normal.     Nose: Congestion present.     Mouth/Throat:     Pharynx: No oropharyngeal exudate or posterior oropharyngeal erythema.  Cardiovascular:     Rate and Rhythm: Normal rate and regular rhythm.     Heart sounds: Normal heart sounds.  Pulmonary:     Effort: Pulmonary effort is normal.     Breath sounds: Wheezing and rhonchi present.  Abdominal:     General: Bowel sounds are normal.  Musculoskeletal:     Right lower leg: No edema.     Left lower leg: No edema.  Lymphadenopathy:     Cervical: No cervical adenopathy.  Neurological:     Mental Status: She is alert.    BP 110/68   Pulse 88   Temp (!) 97.4 F (36.3 C)   Resp 16   Ht 5' 0.5" (1.537 m)   Wt 158 lb (71.7 kg)   LMP 08/22/2005   BMI 30.35 kg/m  Wt Readings from Last 3 Encounters:  03/08/21 158 lb (71.7 kg)  02/12/21 158 lb (71.7 kg)  01/25/21 160 lb 12.8 oz (72.9 kg)    Health Maintenance Due  Topic Date Due   Zoster Vaccines- Shingrix (2 of 2) 07/13/2018    There are no preventive care reminders to display for this patient.   Lab Results  Component Value Date   TSH 3.990 09/15/2020   Lab Results  Component Value Date   WBC 6.3 12/14/2020   HGB 14.2 12/14/2020   HCT 42.6 12/14/2020  MCV 90 12/14/2020   PLT 302 12/14/2020   Lab Results  Component Value Date   NA 141 12/14/2020   K 4.3 12/14/2020   CO2 19 (L) 12/14/2020   GLUCOSE 92 12/14/2020   BUN 17 12/14/2020   CREATININE 0.71 12/14/2020   BILITOT 0.2 12/14/2020   ALKPHOS 102 12/14/2020   AST 24 12/14/2020   ALT 19 12/14/2020   PROT 6.9 12/14/2020   ALBUMIN 4.5 12/14/2020   CALCIUM 9.8 12/14/2020   EGFR 91 12/14/2020   Lab Results  Component Value Date   CHOL 228 (H) 12/14/2020   Lab Results  Component Value Date   HDL 52 12/14/2020   Lab Results  Component  Value Date   LDLCALC 138 (H) 12/14/2020   Lab Results  Component Value Date   TRIG 210 (H) 12/14/2020   Lab Results  Component Value Date   CHOLHDL 4.4 12/14/2020   Lab Results  Component Value Date   HGBA1C 5.7 (H) 12/11/2019       Assessment & Plan:  1. Acute bronchitis due to other specified organisms Complete cefdnir.  - triamcinolone acetonide (KENALOG-40) injection 80 mg - cefTRIAXone (ROCEPHIN) injection 1 g - POC COVID-19 BinaxNow negative.  2. Mild intermittent asthma with exacerbation Duoneb treatment given via neb. Objectively improved. Subjectively still wheezing Use albuterol HFA four times a day for 48 hours then four times a day as needed.  Pt instructed to call in am  to check on temporal artery biopsy results are in. Pt is currently on prednisone 60 mg daily. Going to run out tomorrow.  - albuterol (VENTOLIN HFA) 108 (90 Base) MCG/ACT inhaler; Inhale 2 puffs into the lungs every 6 (six) hours as needed for wheezing or shortness of breath.  Dispense: 8 g; Refill: 2   Meds ordered this encounter  Medications   triamcinolone acetonide (KENALOG-40) injection 80 mg   cefTRIAXone (ROCEPHIN) injection 1 g   albuterol (VENTOLIN HFA) 108 (90 Base) MCG/ACT inhaler    Sig: Inhale 2 puffs into the lungs every 6 (six) hours as needed for wheezing or shortness of breath.    Dispense:  8 g    Refill:  2    Orders Placed This Encounter  Procedures   POC COVID-19 BinaxNow     Follow-up: No follow-ups on file.  An After Visit Summary was printed and given to the patient.  Rochel Brome, MD [facility] [phone]

## 2021-03-10 ENCOUNTER — Other Ambulatory Visit: Payer: Self-pay | Admitting: Family Medicine

## 2021-03-10 ENCOUNTER — Telehealth: Payer: Self-pay

## 2021-03-10 NOTE — Telephone Encounter (Signed)
Attempted to call pt. No answer, left detailed message with request to call back.   Olivia Cruz, [place] 03/10/21 1:54 PM

## 2021-03-10 NOTE — Telephone Encounter (Signed)
Olivia Cruz called to report that she received the bx results yesterday per Rheumatology.  The physician informed her of negative results but felt that the bx was not the best quality.  She was given a prednisone taper dose to continue.  Today she continues to cough but the drainage is clearing.  She is using her inhaler as directed and taking mucinex and the prescription cough medication.

## 2021-03-10 NOTE — Telephone Encounter (Signed)
Pt calling back, pt is nonstop coughing and the tessalon perles are not helping. Pt is requesting a stronger medication but is allergic to codeine.   Olivia Cruz, [place] 03/10/21 10:53 AM

## 2021-03-11 DIAGNOSIS — Z48810 Encounter for surgical aftercare following surgery on the sense organs: Secondary | ICD-10-CM | POA: Diagnosis not present

## 2021-03-11 NOTE — Telephone Encounter (Signed)
Called pt. Pt VU and is feeling some better today.   Olivia Cruz, [place] 03/11/21 9:03 AM

## 2021-03-19 DIAGNOSIS — R0789 Other chest pain: Secondary | ICD-10-CM | POA: Diagnosis not present

## 2021-03-19 DIAGNOSIS — R0602 Shortness of breath: Secondary | ICD-10-CM | POA: Diagnosis not present

## 2021-03-19 DIAGNOSIS — D72829 Elevated white blood cell count, unspecified: Secondary | ICD-10-CM | POA: Diagnosis not present

## 2021-03-19 DIAGNOSIS — J9811 Atelectasis: Secondary | ICD-10-CM | POA: Diagnosis not present

## 2021-03-19 DIAGNOSIS — Z87891 Personal history of nicotine dependence: Secondary | ICD-10-CM | POA: Diagnosis not present

## 2021-03-19 DIAGNOSIS — R1084 Generalized abdominal pain: Secondary | ICD-10-CM | POA: Diagnosis not present

## 2021-03-19 DIAGNOSIS — R490 Dysphonia: Secondary | ICD-10-CM | POA: Diagnosis not present

## 2021-03-19 DIAGNOSIS — R0981 Nasal congestion: Secondary | ICD-10-CM | POA: Diagnosis not present

## 2021-03-19 DIAGNOSIS — R0600 Dyspnea, unspecified: Secondary | ICD-10-CM | POA: Diagnosis not present

## 2021-03-19 DIAGNOSIS — E041 Nontoxic single thyroid nodule: Secondary | ICD-10-CM | POA: Diagnosis not present

## 2021-03-19 DIAGNOSIS — R079 Chest pain, unspecified: Secondary | ICD-10-CM | POA: Diagnosis not present

## 2021-03-19 DIAGNOSIS — R059 Cough, unspecified: Secondary | ICD-10-CM | POA: Diagnosis not present

## 2021-03-19 NOTE — Progress Notes (Deleted)
error 

## 2021-03-22 ENCOUNTER — Encounter: Payer: Self-pay | Admitting: Family Medicine

## 2021-03-22 ENCOUNTER — Ambulatory Visit (INDEPENDENT_AMBULATORY_CARE_PROVIDER_SITE_OTHER): Payer: Medicare HMO | Admitting: Family Medicine

## 2021-03-22 ENCOUNTER — Ambulatory Visit (INDEPENDENT_AMBULATORY_CARE_PROVIDER_SITE_OTHER): Payer: Medicare HMO | Admitting: Nurse Practitioner

## 2021-03-22 VITALS — BP 110/64 | HR 80 | Temp 96.3°F | Resp 18 | Ht 61.0 in | Wt 162.6 lb

## 2021-03-22 DIAGNOSIS — J208 Acute bronchitis due to other specified organisms: Secondary | ICD-10-CM

## 2021-03-22 DIAGNOSIS — M545 Low back pain, unspecified: Secondary | ICD-10-CM

## 2021-03-22 DIAGNOSIS — R0602 Shortness of breath: Secondary | ICD-10-CM | POA: Diagnosis not present

## 2021-03-22 DIAGNOSIS — G8929 Other chronic pain: Secondary | ICD-10-CM

## 2021-03-22 NOTE — Progress Notes (Signed)
Acute Office Visit  Subjective:    Patient ID: Olivia Cruz, female    DOB: 16-Sep-1950, 70 y.o.   MRN: [id number]  Chief Complaint  Patient presents with   Cough    HPI Patient is in today for Hospital follow-up.  She seen at [facility] Emergency Department on July 29.  She was diagnosed with post-bronchitis cough and flank pain. Negative cta of chest and chest xray. EKG was normal. Chart reviewed.    Past Medical History:  Diagnosis Date   Allergic rhinoconjunctivitis    Anxiety    Asthma    Depression    Diverticulitis    GERD (gastroesophageal reflux disease)    Glaucoma    Hypothyroid    Insomnia    Laryngopharyngeal reflux (LPR)    Migraines    Osteoarthritis    Psoriatic arthritis ([place])    Skin cancer    STD (sexually transmitted disease)    HSV II    Past Surgical History:  Procedure Laterality Date   BELPHAROPTOSIS REPAIR     BREAST SURGERY Left    times 2   COLONOSCOPY  06/03/2013   Moderate predominantly sigmoid diverticulosis. Small interal hemorroids   FOOT SURGERY Right    Removed bone spur   GANGLION CYST EXCISION Left    foot   HEMORRHOID SURGERY     SKIN CANCER EXCISION     TOTAL KNEE ARTHROPLASTY Right 07/21/2020   UPPER GI ENDOSCOPY  03/23/2016   Mild gastritis, gastric polyps bxbenign squamous mucosa with no abnormaility, fundic glad poly in setting of mild chron gastritis.    Family History  Problem Relation Age of Onset   Breast cancer Sister        lung   Cancer Maternal Grandmother    Multiple births Maternal Grandmother    Multiple births Paternal Grandmother    Cancer Paternal Grandmother    Thyroid disease Mother    Heart failure Mother    Heart disease Mother    Hyperlipidemia Mother    Hypertension Mother    Stroke Mother    Depression Mother    Parkinson's disease Father    Glaucoma Father    Diverticulitis Father     Social History   Socioeconomic History   Marital status: Married    Spouse  name: Not on file   Number of children: Not on file   Years of education: Not on file   Highest education level: Not on file  Occupational History   Not on file  Tobacco Use   Smoking status: Former    Types: Cigarettes    Quit date: 1986    Years since quitting: 36.6   Smokeless tobacco: Never  Vaping Use   Vaping Use: Never used  Substance and Sexual Activity   Alcohol use: No    Alcohol/week: 0.0 standard drinks   Drug use: No   Sexual activity: Yes    Partners: Male    Comment: husband vasectomy  Other Topics Concern   Not on file  Social History Narrative   Not on file   Social Determinants of Health   Financial Resource Strain: Low Risk    Difficulty of Paying Living Expenses: Not hard at all  Food Insecurity: No Food Insecurity   Worried About Charity fundraiser in the Last Year: Never true   [place] in the Last Year: Never true  Transportation Needs: No Transportation Needs   Lack of Transportation (Medical): No  Lack of Transportation (Non-Medical): No  Physical Activity: Sufficiently Active   Days of Exercise per Week: 5 days   Minutes of Exercise per Session: 30 min  Stress: No Stress Concern Present   Feeling of Stress : Only a little  Social Connections: Engineer, building services of Communication with Friends and Family: More than three times a week   Frequency of Social Gatherings with Friends and Family: More than three times a week   Attends Religious Services: More than 4 times per year   Active Member of [employer] or Organizations: Yes   Attends Music therapist: More than 4 times per year   Marital Status: Married  Human resources officer Violence: Not At Risk   Fear of Current or Ex-Partner: No   Emotionally Abused: No   Physically Abused: No   Sexually Abused: No    Outpatient Medications Prior to Visit  Medication Sig Dispense Refill   acetaminophen (TYLENOL) 325 MG tablet Take 500 mg by mouth every 6 (six) hours as  needed.     Adalimumab 40 MG/0.4ML PNKT Inject into the skin.     albuterol (VENTOLIN HFA) 108 (90 Base) MCG/ACT inhaler Inhale 2 puffs into the lungs every 6 (six) hours as needed for wheezing or shortness of breath. 8 g 2   alendronate (FOSAMAX) 70 MG tablet Take by mouth.     budesonide (RHINOCORT AQUA) 32 MCG/ACT nasal spray Place 1 spray into both nostrils daily. Reported on 02/04/2016     Calcium Carbonate-Vitamin D (CALCIUM + D PO) Take 1,200 mg by mouth daily.     cefdinir (OMNICEF) 300 MG capsule Take 300 mg by mouth 2 (two) times daily.     cholecalciferol (VITAMIN D3) 25 MCG (1000 UNIT) tablet Take 1,000 Units by mouth in the morning, at noon, and at bedtime.     ciclopirox (PENLAC) 8 % solution APPLY TO TOENAIL EVERY NIGHT FOR 7 NIGHTS, ON 8TH NIGHT WIPE WITH ALCOHOL AND REPEAT     citalopram (CELEXA) 20 MG tablet Take 1 tablet (20 mg total) by mouth daily. 90 tablet 0   diazepam (VALIUM) 5 MG tablet One pill 30 minutes prior to mri. May repeat one pill in 30 minutes prn anxiety 5 tablet 0   diclofenac Sodium (VOLTAREN) 1 % GEL      folic acid (FOLVITE) 1 MG tablet Take 1 tablet by mouth daily.     gabapentin (NEURONTIN) 300 MG capsule Take 300 mg by mouth in the morning, at noon, and at bedtime.     levothyroxine (SYNTHROID) 50 MCG tablet Take 1 tablet (50 mcg total) by mouth daily. 90 tablet 1   lidocaine (XYLOCAINE) 1 % (with preservative) injection by Infiltration route.     Magnesium 250 MG TABS Take 250 mg by mouth daily.     Melatonin 5 MG LOZG Take by mouth.     methotrexate (RHEUMATREX) 2.5 MG tablet Take 6 tablets (15 mg total) by mouth once a week. Takes 8 tablets (20 mg) once a week 1 tablet 0   Multiple Vitamin (MULTIVITAMIN) capsule Take 1 capsule by mouth daily.     Multiple Vitamins-Minerals (HAIR SKIN AND NAILS FORMULA PO) Take by mouth daily.     pantoprazole (PROTONIX) 40 MG tablet Take 1 tablet (40 mg total) by mouth daily. 90 tablet 1   predniSONE (DELTASONE) 2.5  MG tablet      promethazine-dextromethorphan (PROMETHAZINE-DM) 6.25-15 MG/5ML syrup Take 5 mLs by mouth 4 (four) times daily as  needed for cough. 118 mL 0   rosuvastatin (CRESTOR) 10 MG tablet Take 1 tablet (10 mg total) by mouth daily. 90 tablet 0   valACYclovir (VALTREX) 500 MG tablet daily as needed.      No facility-administered medications prior to visit.    Allergies  Allergen Reactions   Codeine Anaphylaxis and Nausea Only   Ambien [Zolpidem Tartrate] Other (See Comments)    Memory loss per patient   Augmentin [Amoxicillin-Pot Clavulanate] Nausea And Vomiting   Cyproheptadine Hcl Other (See Comments)    Confusion   Dilaudid [Hydromorphone]     hallucinations   Hydrocodone Other (See Comments)    anaphylaxis   Lisinopril    Sulfa Antibiotics Other (See Comments)    hives   Tramadol    Other Rash    Paper tape caused rash    Review of Systems  Constitutional:  Positive for fatigue. Negative for chills and fever.  HENT:  Positive for postnasal drip. Negative for congestion, rhinorrhea and sore throat.   Respiratory:  Positive for cough and shortness of breath.   Cardiovascular:  Positive for chest pain.  Gastrointestinal:  Negative for abdominal pain, constipation, diarrhea, nausea and vomiting.  Genitourinary:  Negative for dysuria and urgency.  Musculoskeletal:  Negative for back pain and myalgias.  Neurological:  Negative for dizziness, weakness, light-headedness and headaches.  Psychiatric/Behavioral:  Negative for dysphoric mood. The patient is not nervous/anxious.       Objective:    Physical Exam Vitals reviewed.  Constitutional:      Appearance: Normal appearance. She is normal weight.  HENT:     Right Ear: Tympanic membrane, ear canal and external ear normal.     Left Ear: Tympanic membrane, ear canal and external ear normal.     Nose: Nose normal.     Mouth/Throat:     Pharynx: Oropharynx is clear.     Comments: hoarse Neck:     Vascular: No carotid  bruit.  Cardiovascular:     Rate and Rhythm: Normal rate and regular rhythm.     Pulses: Normal pulses.     Heart sounds: Normal heart sounds. No murmur heard. Pulmonary:     Effort: Pulmonary effort is normal. No respiratory distress.     Breath sounds: Normal breath sounds.  Neurological:     Mental Status: She is alert and oriented to person, place, and time.  Psychiatric:        Mood and Affect: Mood normal.        Behavior: Behavior normal.    BP 110/64   Pulse 80   Temp (!) 96.3 F (35.7 C)   Resp 18   Ht 5' 1" (1.549 m)   Wt 162 lb 9.6 oz (73.8 kg)   LMP 08/22/2005   BMI 30.72 kg/m  Wt Readings from Last 3 Encounters:  03/22/21 162 lb 9.6 oz (73.8 kg)  03/08/21 158 lb (71.7 kg)  02/12/21 158 lb (71.7 kg)    Health Maintenance Due  Topic Date Due   Zoster Vaccines- Shingrix (2 of 2) 07/13/2018   INFLUENZA VACCINE  03/22/2021    There are no preventive care reminders to display for this patient.   Lab Results  Component Value Date   TSH 3.990 09/15/2020   Lab Results  Component Value Date   WBC 6.3 12/14/2020   HGB 14.2 12/14/2020   HCT 42.6 12/14/2020   MCV 90 12/14/2020   PLT 302 12/14/2020   Lab Results  Component Value  Date   NA 141 12/14/2020   K 4.3 12/14/2020   CO2 19 (L) 12/14/2020   GLUCOSE 92 12/14/2020   BUN 17 12/14/2020   CREATININE 0.71 12/14/2020   BILITOT 0.2 12/14/2020   ALKPHOS 102 12/14/2020   AST 24 12/14/2020   ALT 19 12/14/2020   PROT 6.9 12/14/2020   ALBUMIN 4.5 12/14/2020   CALCIUM 9.8 12/14/2020   EGFR 91 12/14/2020   Lab Results  Component Value Date   CHOL 228 (H) 12/14/2020   Lab Results  Component Value Date   HDL 52 12/14/2020   Lab Results  Component Value Date   LDLCALC 138 (H) 12/14/2020   Lab Results  Component Value Date   TRIG 210 (H) 12/14/2020   Lab Results  Component Value Date   CHOLHDL 4.4 12/14/2020   Lab Results  Component Value Date   HGBA1C 5.7 (H) 12/11/2019        Assessment & Plan:  1. Acute bronchitis due to other specified organisms  Continue symptomatic treatment.  Discussed options.  No nsaids as she is on prednisone.  May use tylenol for flank pain.   Follow-up: No follow-ups on file.  An After Visit Summary was printed and given to the patient.  Rochel Brome, MD  Family Practice [phone]

## 2021-03-30 ENCOUNTER — Ambulatory Visit: Payer: Medicare HMO | Admitting: Family Medicine

## 2021-04-01 ENCOUNTER — Ambulatory Visit (INDEPENDENT_AMBULATORY_CARE_PROVIDER_SITE_OTHER): Payer: Medicare HMO | Admitting: Family Medicine

## 2021-04-01 ENCOUNTER — Encounter: Payer: Self-pay | Admitting: Family Medicine

## 2021-04-01 VITALS — BP 110/62 | HR 78 | Temp 97.2°F | Ht 61.0 in | Wt 161.0 lb

## 2021-04-01 DIAGNOSIS — L4050 Arthropathic psoriasis, unspecified: Secondary | ICD-10-CM | POA: Diagnosis not present

## 2021-04-01 DIAGNOSIS — K219 Gastro-esophageal reflux disease without esophagitis: Secondary | ICD-10-CM

## 2021-04-01 DIAGNOSIS — F330 Major depressive disorder, recurrent, mild: Secondary | ICD-10-CM | POA: Diagnosis not present

## 2021-04-01 DIAGNOSIS — G2581 Restless legs syndrome: Secondary | ICD-10-CM

## 2021-04-01 DIAGNOSIS — R7303 Prediabetes: Secondary | ICD-10-CM | POA: Diagnosis not present

## 2021-04-01 DIAGNOSIS — I10 Essential (primary) hypertension: Secondary | ICD-10-CM | POA: Diagnosis not present

## 2021-04-01 DIAGNOSIS — Z1231 Encounter for screening mammogram for malignant neoplasm of breast: Secondary | ICD-10-CM | POA: Diagnosis not present

## 2021-04-01 DIAGNOSIS — E038 Other specified hypothyroidism: Secondary | ICD-10-CM | POA: Diagnosis not present

## 2021-04-01 DIAGNOSIS — E782 Mixed hyperlipidemia: Secondary | ICD-10-CM | POA: Diagnosis not present

## 2021-04-01 NOTE — Progress Notes (Signed)
Established Patient Office Visit  Subjective:  Patient ID: Olivia Cruz, female    DOB: 08/10/1951  Age: 70 y.o. MRN: [id number]  CC:  Chief Complaint  Patient presents with   Hyperlipidemia   Hypertension    HPI [name] presents today for a chronic disease visit but also has complaints of right shoulder pain that occurred two weeks ago while lifting her dog. She does not have pain while sitting but rates it a 7/10 with movement. She has applied a Lidocaine patch and has taken Tylenol which did improve the pain. She did say that her pain is gradually improving.  She also states she is still coughing and is hoarse but has been occurring for a couple months. She is also having rib pain from coughing and has been taking Delysm, which has helped the cough. Essential hypertension She is not taking any medications for her blood pressure but does check her blood pressure at home. She does state she needs to bring her b/p cuff to the office to be checked. She avoids salt in her food and is physically active.  Secondary hypothyroidism Takes Synthroid 50 mcg daily Mixed hyperlipidemia Takes Crestor 10 mg daily Prediabetes She is trying to eat healthy and stay active RLS (restless legs syndrome) She takes Gabapentin 300 mg tid. Working.  Psoriatic arthritis ([place]) She follows with rheumatology. She has not been taking the Methotrexate per rheumatology and has not started the Humira because of the cost. She is supposed to meet with the pharmacist to see about financial assistance.   Gastroesophageal reflux disease without esophagitis She takes Protonix 40 mg daily. She does have problems with reflux if she does not take the Protonix. Depression She is taking Celexa 20 mg daily and denies any depression during the visit.   Past Medical History:  Diagnosis Date   Allergic rhinoconjunctivitis    Anxiety    Asthma    Depression    Diverticulitis    GERD (gastroesophageal reflux  disease)    Glaucoma    Hypothyroid    Insomnia    Laryngopharyngeal reflux (LPR)    Migraines    Osteoarthritis    Psoriatic arthritis ([place])    Skin cancer    STD (sexually transmitted disease)    HSV II    Past Surgical History:  Procedure Laterality Date   BELPHAROPTOSIS REPAIR     BREAST SURGERY Left    times 2   COLONOSCOPY  06/03/2013   Moderate predominantly sigmoid diverticulosis. Small interal hemorroids   FOOT SURGERY Right    Removed bone spur   GANGLION CYST EXCISION Left    foot   HEMORRHOID SURGERY     SKIN CANCER EXCISION     TOTAL KNEE ARTHROPLASTY Right 07/21/2020   UPPER GI ENDOSCOPY  03/23/2016   Mild gastritis, gastric polyps bxbenign squamous mucosa with no abnormaility, fundic glad poly in setting of mild chron gastritis.    Family History  Problem Relation Age of Onset   Breast cancer Sister        lung   Cancer Maternal Grandmother    Multiple births Maternal Grandmother    Multiple births Paternal Grandmother    Cancer Paternal Grandmother    Thyroid disease Mother    Heart failure Mother    Heart disease Mother    Hyperlipidemia Mother    Hypertension Mother    Stroke Mother    Depression Mother    Parkinson's disease Father    Glaucoma Father  Diverticulitis Father     Social History   Socioeconomic History   Marital status: Married    Spouse name: Not on file   Number of children: Not on file   Years of education: Not on file   Highest education level: Not on file  Occupational History   Not on file  Tobacco Use   Smoking status: Former    Types: Cigarettes    Quit date: 98    Years since quitting: 36.6   Smokeless tobacco: Never  Vaping Use   Vaping Use: Never used  Substance and Sexual Activity   Alcohol use: No    Alcohol/week: 0.0 standard drinks   Drug use: No   Sexual activity: Yes    Partners: Male    Comment: husband vasectomy  Other Topics Concern   Not on file  Social History Narrative   Not on  file   Social Determinants of Health   Financial Resource Strain: Low Risk    Difficulty of Paying Living Expenses: Not hard at all  Food Insecurity: No Food Insecurity   Worried About Charity fundraiser in the Last Year: Never true   [place] in the Last Year: Never true  Transportation Needs: No Transportation Needs   Lack of Transportation (Medical): No   Lack of Transportation (Non-Medical): No  Physical Activity: Sufficiently Active   Days of Exercise per Week: 5 days   Minutes of Exercise per Session: 30 min  Stress: No Stress Concern Present   Feeling of Stress : Only a little  Social Connections: Engineer, building services of Communication with Friends and Family: More than three times a week   Frequency of Social Gatherings with Friends and Family: More than three times a week   Attends Religious Services: More than 4 times per year   Active Member of [employer] or Organizations: Yes   Attends Music therapist: More than 4 times per year   Marital Status: Married  Human resources officer Violence: Not At Risk   Fear of Current or Ex-Partner: No   Emotionally Abused: No   Physically Abused: No   Sexually Abused: No    Outpatient Medications Prior to Visit  Medication Sig Dispense Refill   acetaminophen (TYLENOL) 325 MG tablet Take 500 mg by mouth every 6 (six) hours as needed.     Adalimumab 40 MG/0.4ML PNKT Inject into the skin.     albuterol (VENTOLIN HFA) 108 (90 Base) MCG/ACT inhaler Inhale 2 puffs into the lungs every 6 (six) hours as needed for wheezing or shortness of breath. 8 g 2   alendronate (FOSAMAX) 70 MG tablet Take by mouth.     budesonide (RHINOCORT AQUA) 32 MCG/ACT nasal spray Place 1 spray into both nostrils daily. Reported on 02/04/2016     Calcium Carbonate-Vitamin D (CALCIUM + D PO) Take 1,200 mg by mouth daily.     cholecalciferol (VITAMIN D3) 25 MCG (1000 UNIT) tablet Take 1,000 Units by mouth in the morning, at noon, and at bedtime.      ciclopirox (PENLAC) 8 % solution APPLY TO TOENAIL EVERY NIGHT FOR 7 NIGHTS, ON 8TH NIGHT WIPE WITH ALCOHOL AND REPEAT     diazepam (VALIUM) 5 MG tablet One pill 30 minutes prior to mri. May repeat one pill in 30 minutes prn anxiety 5 tablet 0   diclofenac Sodium (VOLTAREN) 1 % GEL      folic acid (FOLVITE) 1 MG tablet Take 1 tablet by mouth  daily.     gabapentin (NEURONTIN) 300 MG capsule Take 300 mg by mouth in the morning, at noon, and at bedtime.     levothyroxine (SYNTHROID) 50 MCG tablet Take 1 tablet (50 mcg total) by mouth daily. 90 tablet 1   lidocaine (XYLOCAINE) 1 % (with preservative) injection by Infiltration route.     Magnesium 250 MG TABS Take 250 mg by mouth daily.     Melatonin 5 MG LOZG Take by mouth.     methotrexate (RHEUMATREX) 2.5 MG tablet Take 6 tablets (15 mg total) by mouth once a week. Takes 8 tablets (20 mg) once a week 1 tablet 0   Multiple Vitamin (MULTIVITAMIN) capsule Take 1 capsule by mouth daily.     Multiple Vitamins-Minerals (HAIR SKIN AND NAILS FORMULA PO) Take by mouth daily.     pantoprazole (PROTONIX) 40 MG tablet Take 1 tablet (40 mg total) by mouth daily. 90 tablet 1   predniSONE (DELTASONE) 2.5 MG tablet      promethazine-dextromethorphan (PROMETHAZINE-DM) 6.25-15 MG/5ML syrup Take 5 mLs by mouth 4 (four) times daily as needed for cough. 118 mL 0   valACYclovir (VALTREX) 500 MG tablet daily as needed.      cefdinir (OMNICEF) 300 MG capsule Take 300 mg by mouth 2 (two) times daily.     citalopram (CELEXA) 20 MG tablet Take 1 tablet (20 mg total) by mouth daily. 90 tablet 0   rosuvastatin (CRESTOR) 10 MG tablet Take 1 tablet (10 mg total) by mouth daily. 90 tablet 0   No facility-administered medications prior to visit.    Allergies  Allergen Reactions   Codeine Anaphylaxis and Nausea Only   Ambien [Zolpidem Tartrate] Other (See Comments)    Memory loss per patient   Augmentin [Amoxicillin-Pot Clavulanate] Nausea And Vomiting   Cyproheptadine  Hcl Other (See Comments)    Confusion   Dilaudid [Hydromorphone]     hallucinations   Hydrocodone Other (See Comments)    anaphylaxis   Lisinopril    Sulfa Antibiotics Other (See Comments)    hives   Tramadol    Other Rash    Paper tape caused rash    ROS Review of Systems  Constitutional:  Negative for chills, fatigue and fever.  HENT:  Negative for congestion and sore throat.   Eyes:  Negative for visual disturbance.  Respiratory:  Positive for cough and shortness of breath.   Cardiovascular:  Negative for chest pain, palpitations and leg swelling.  Gastrointestinal:  Negative for abdominal pain, constipation, diarrhea, nausea and vomiting.  Endocrine: Negative for polyphagia and polyuria.  Genitourinary:  Negative for difficulty urinating, frequency and urgency.  Neurological:  Negative for dizziness, weakness and numbness.  Psychiatric/Behavioral:  The patient is not nervous/anxious.      Objective:    Physical Exam Vitals reviewed.  Constitutional:      Appearance: Normal appearance.  HENT:     Head: Normocephalic.     Right Ear: Tympanic membrane, ear canal and external ear normal.     Left Ear: Tympanic membrane, ear canal and external ear normal.     Nose: Nose normal.     Mouth/Throat:     Mouth: Mucous membranes are moist.     Pharynx: Oropharynx is clear.  Cardiovascular:     Rate and Rhythm: Normal rate and regular rhythm.     Pulses: Normal pulses.     Heart sounds: Normal heart sounds.  Pulmonary:     Effort: Pulmonary effort is normal.  Breath sounds: Normal breath sounds.  Abdominal:     General: Abdomen is flat. Bowel sounds are normal.     Palpations: Abdomen is soft.  Musculoskeletal:     Right upper arm: Tenderness present.       Arms:  Skin:    General: Skin is warm and dry.  Neurological:     General: No focal deficit present.     Mental Status: She is alert and oriented to person, place, and time. Mental status is at baseline.   Psychiatric:        Mood and Affect: Mood normal.        Behavior: Behavior normal.        Thought Content: Thought content normal.    BP 110/62   Pulse 78   Temp (!) 97.2 F (36.2 C)   Ht 5' 1" (1.549 m)   Wt 161 lb (73 kg)   LMP 08/22/2005   SpO2 99%   BMI 30.42 kg/m  Wt Readings from Last 3 Encounters:  04/01/21 161 lb (73 kg)  03/22/21 162 lb 9.6 oz (73.8 kg)  03/08/21 158 lb (71.7 kg)     Health Maintenance Due  Topic Date Due   Zoster Vaccines- Shingrix (2 of 2) 07/13/2018   INFLUENZA VACCINE  03/22/2021   MAMMOGRAM  03/31/2021   COVID-19 Vaccine (5 - Booster for Pfizer series) 04/19/2021    There are no preventive care reminders to display for this patient.  Lab Results  Component Value Date   TSH 3.990 09/15/2020   Lab Results  Component Value Date   WBC 6.9 04/01/2021   HGB 14.1 04/01/2021   HCT 43.4 04/01/2021   MCV 92 04/01/2021   PLT 277 04/01/2021   Lab Results  Component Value Date   NA 145 (H) 04/01/2021   K 4.6 04/01/2021   CO2 24 04/01/2021   GLUCOSE 87 04/01/2021   BUN 11 04/01/2021   CREATININE 0.82 04/01/2021   BILITOT 0.3 04/01/2021   ALKPHOS 99 04/01/2021   AST 26 04/01/2021   ALT 25 04/01/2021   PROT 6.2 04/01/2021   ALBUMIN 4.1 04/01/2021   CALCIUM 9.6 04/01/2021   EGFR 76 04/01/2021   Lab Results  Component Value Date   CHOL 148 04/01/2021   Lab Results  Component Value Date   HDL 50 04/01/2021   Lab Results  Component Value Date   LDLCALC 74 04/01/2021   Lab Results  Component Value Date   TRIG 139 04/01/2021   Lab Results  Component Value Date   CHOLHDL 3.0 04/01/2021   Lab Results  Component Value Date   HGBA1C 6.2 (H) 04/01/2021      Assessment & Plan:  1. Essential hypertension - Controlled - Comprehensive metabolic panel - Continue to check b/p at home - Continue physical activity and encouraged healthy diet 2. Secondary hypothyroidism - At goal - Continue Synthroid 50 mcg daily 3. Mixed  hyperlipidemia - Controlled - Lipid panel - Continue Crestor 10 mg daily 4. Prediabetes - Controlled - CBC with Differential/Platelet - Hemoglobin A1c - Continue physical activity and encouraged healthy diet 5. RLS (restless legs syndrome) - Controlled  - Continue Gabapentin 300 mg tid 6. Psoriatic arthritis ([place]) - Controlled - Continue follow-up with rheumatology and follow their medication recommendation - Plan to see pharmacist for medication assistance for Humira 7. Gastroesophageal reflux disease without esophagitis - Controlled - Continue Protonix 40 mg daily 8. Screening mammogram for breast cancer - Mobile mammogram to be scheduled  Follow-up: No follow-ups on file.    Rochel Brome, MD

## 2021-04-02 LAB — CBC WITH DIFFERENTIAL/PLATELET
Basophils Absolute: 0.1 10*3/uL (ref 0.0–0.2)
Basos: 1 %
EOS (ABSOLUTE): 0.2 10*3/uL (ref 0.0–0.4)
Eos: 3 %
Hematocrit: 43.4 % (ref 34.0–46.6)
Hemoglobin: 14.1 g/dL (ref 11.1–15.9)
Immature Grans (Abs): 0.1 10*3/uL (ref 0.0–0.1)
Immature Granulocytes: 1 %
Lymphocytes Absolute: 1.6 10*3/uL (ref 0.7–3.1)
Lymphs: 23 %
MCH: 29.9 pg (ref 26.6–33.0)
MCHC: 32.5 g/dL (ref 31.5–35.7)
MCV: 92 fL (ref 79–97)
Monocytes Absolute: 0.6 10*3/uL (ref 0.1–0.9)
Monocytes: 9 %
Neutrophils Absolute: 4.4 10*3/uL (ref 1.4–7.0)
Neutrophils: 63 %
Platelets: 277 10*3/uL (ref 150–450)
RBC: 4.72 x10E6/uL (ref 3.77–5.28)
RDW: 13.4 % (ref 11.7–15.4)
WBC: 6.9 10*3/uL (ref 3.4–10.8)

## 2021-04-02 LAB — COMPREHENSIVE METABOLIC PANEL
ALT: 25 IU/L (ref 0–32)
AST: 26 IU/L (ref 0–40)
Albumin/Globulin Ratio: 2 (ref 1.2–2.2)
Albumin: 4.1 g/dL (ref 3.7–4.7)
Alkaline Phosphatase: 99 IU/L (ref 44–121)
BUN/Creatinine Ratio: 13 (ref 12–28)
BUN: 11 mg/dL (ref 8–27)
Bilirubin Total: 0.3 mg/dL (ref 0.0–1.2)
CO2: 24 mmol/L (ref 20–29)
Calcium: 9.6 mg/dL (ref 8.7–10.3)
Chloride: 108 mmol/L — ABNORMAL HIGH (ref 96–106)
Creatinine, Ser: 0.82 mg/dL (ref 0.57–1.00)
Globulin, Total: 2.1 g/dL (ref 1.5–4.5)
Glucose: 87 mg/dL (ref 65–99)
Potassium: 4.6 mmol/L (ref 3.5–5.2)
Sodium: 145 mmol/L — ABNORMAL HIGH (ref 134–144)
Total Protein: 6.2 g/dL (ref 6.0–8.5)
eGFR: 76 mL/min/{1.73_m2} (ref >59)

## 2021-04-02 LAB — LIPID PANEL
Chol/HDL Ratio: 3 ratio (ref 0.0–4.4)
Cholesterol, Total: 148 mg/dL (ref 100–199)
HDL: 50 mg/dL (ref >39)
LDL Chol Calc (NIH): 74 mg/dL (ref 0–99)
Triglycerides: 139 mg/dL (ref 0–149)
VLDL Cholesterol Cal: 24 mg/dL (ref 5–40)

## 2021-04-02 LAB — HEMOGLOBIN A1C
Est. average glucose Bld gHb Est-mCnc: 131 mg/dL
Hgb A1c MFr Bld: 6.2 % — ABNORMAL HIGH (ref 4.8–5.6)

## 2021-04-02 LAB — CARDIOVASCULAR RISK ASSESSMENT

## 2021-04-05 ENCOUNTER — Other Ambulatory Visit: Payer: Self-pay | Admitting: Family Medicine

## 2021-04-05 ENCOUNTER — Other Ambulatory Visit: Payer: Self-pay

## 2021-04-05 MED ORDER — METFORMIN HCL 500 MG PO TABS: 500.0000 mg | ORAL_TABLET | Freq: Every day | ORAL | 0 refills | Status: DC

## 2021-04-05 MED ORDER — ROSUVASTATIN CALCIUM 10 MG PO TABS: 10.0000 mg | ORAL_TABLET | Freq: Every day | ORAL | 0 refills | Status: DC

## 2021-04-10 DIAGNOSIS — Z8719 Personal history of other diseases of the digestive system: Secondary | ICD-10-CM | POA: Diagnosis not present

## 2021-04-10 DIAGNOSIS — R1030 Lower abdominal pain, unspecified: Secondary | ICD-10-CM | POA: Diagnosis not present

## 2021-04-12 ENCOUNTER — Other Ambulatory Visit: Payer: Self-pay

## 2021-04-12 MED ORDER — CITALOPRAM HYDROBROMIDE 20 MG PO TABS: 20.0000 mg | ORAL_TABLET | Freq: Every day | ORAL | 0 refills | Status: DC

## 2021-04-12 NOTE — Telephone Encounter (Signed)
Patient called stated that she went to to see a doctor while visiting her daughter in [place], her has diverticulitis, and was told she could not fly back home until it is gone, and wanted to know if her citalopram can be sent to [facility] on [address] zip code [phone]

## 2021-04-14 ENCOUNTER — Encounter: Payer: Self-pay | Admitting: Gastroenterology

## 2021-04-16 DIAGNOSIS — R21 Rash and other nonspecific skin eruption: Secondary | ICD-10-CM | POA: Diagnosis not present

## 2021-04-16 DIAGNOSIS — R29898 Other symptoms and signs involving the musculoskeletal system: Secondary | ICD-10-CM | POA: Diagnosis not present

## 2021-04-16 DIAGNOSIS — Z79899 Other long term (current) drug therapy: Secondary | ICD-10-CM | POA: Diagnosis not present

## 2021-04-16 DIAGNOSIS — L4050 Arthropathic psoriasis, unspecified: Secondary | ICD-10-CM | POA: Diagnosis not present

## 2021-04-16 DIAGNOSIS — R059 Cough, unspecified: Secondary | ICD-10-CM | POA: Diagnosis not present

## 2021-04-16 DIAGNOSIS — R7982 Elevated C-reactive protein (CRP): Secondary | ICD-10-CM | POA: Diagnosis not present

## 2021-04-16 DIAGNOSIS — R519 Headache, unspecified: Secondary | ICD-10-CM | POA: Diagnosis not present

## 2021-04-19 ENCOUNTER — Telehealth: Payer: Self-pay

## 2021-04-19 ENCOUNTER — Telehealth: Payer: Self-pay | Admitting: Family Medicine

## 2021-04-19 NOTE — Chronic Care Management (AMB) (Signed)
04/19/2021- Returning patients call, patient left message regarding paperwork she would like to complete for [employer] to help get medication at a lower cost. Talked with Donette Larry, Pharm D. pap form for patient to pickup and take to rheumatology has been at the front desk since last month. Called patient to inform, no answer, left message to return call.   Pattricia Boss, [place] Pharmacist Assistant [phone]

## 2021-04-19 NOTE — Telephone Encounter (Signed)
   Olivia Cruz has been scheduled for the following appointment:  WHAT: SCREENING MAMMOGRAM WHERE: [facility] DATE: 04/23/21 TIME: 11:30 AM ARRIVAL TIME  Patient has been made aware.

## 2021-04-22 ENCOUNTER — Telehealth: Payer: Self-pay

## 2021-04-22 DIAGNOSIS — R519 Headache, unspecified: Secondary | ICD-10-CM | POA: Diagnosis not present

## 2021-04-22 NOTE — Chronic Care Management (AMB) (Signed)
   Chronic Care Management Pharmacy Assistant   Name: Olivia Cruz  MRN: [id number] DOB: [birth year]  Reason for Encounter: Patient Assistance Coordination  04/22/2021- Following up with patient regarding paperwork she would like to complete for Humira injections to help get medication at a lower cost. Talked with Donette Larry, Pharm D. pap form for patient to pickup and take to rheumatology has been at the front desk since last month. Called patient no answer, left message to return call and mentioned paperwork was at the front desk.   05/10/2021- Called patient to follow up to see if she received paperwork placed at the front desk at Dr Cox office for Humira. Spoke with patient, she did pick up application and filled out, she has a copy of her tax returns and has a visit with her Rheumatologist- Dr Manuella Ghazi on 05/13/2021.  Olivia Cruz, [place] Pharmacist Assistant [phone]

## 2021-04-28 LAB — HM MAMMOGRAPHY: HM Mammogram: NORMAL (ref 0–4)

## 2021-04-29 DIAGNOSIS — Z96651 Presence of right artificial knee joint: Secondary | ICD-10-CM | POA: Diagnosis not present

## 2021-04-29 DIAGNOSIS — M25561 Pain in right knee: Secondary | ICD-10-CM | POA: Diagnosis not present

## 2021-04-30 DIAGNOSIS — Z1231 Encounter for screening mammogram for malignant neoplasm of breast: Secondary | ICD-10-CM | POA: Diagnosis not present

## 2021-05-03 DIAGNOSIS — M25561 Pain in right knee: Secondary | ICD-10-CM | POA: Diagnosis not present

## 2021-05-03 DIAGNOSIS — M79661 Pain in right lower leg: Secondary | ICD-10-CM | POA: Diagnosis not present

## 2021-05-07 DIAGNOSIS — G43909 Migraine, unspecified, not intractable, without status migrainosus: Secondary | ICD-10-CM | POA: Diagnosis not present

## 2021-05-07 DIAGNOSIS — G43009 Migraine without aura, not intractable, without status migrainosus: Secondary | ICD-10-CM | POA: Insufficient documentation

## 2021-05-07 DIAGNOSIS — R0683 Snoring: Secondary | ICD-10-CM | POA: Insufficient documentation

## 2021-05-07 DIAGNOSIS — G2581 Restless legs syndrome: Secondary | ICD-10-CM | POA: Diagnosis not present

## 2021-05-07 HISTORY — DX: Snoring: R06.83

## 2021-05-07 HISTORY — DX: Migraine without aura, not intractable, without status migrainosus: G43.009

## 2021-05-13 DIAGNOSIS — F32A Depression, unspecified: Secondary | ICD-10-CM | POA: Diagnosis not present

## 2021-05-13 DIAGNOSIS — Z471 Aftercare following joint replacement surgery: Secondary | ICD-10-CM | POA: Diagnosis not present

## 2021-05-13 DIAGNOSIS — L4050 Arthropathic psoriasis, unspecified: Secondary | ICD-10-CM | POA: Diagnosis not present

## 2021-05-13 DIAGNOSIS — M47812 Spondylosis without myelopathy or radiculopathy, cervical region: Secondary | ICD-10-CM | POA: Diagnosis not present

## 2021-05-13 DIAGNOSIS — R6889 Other general symptoms and signs: Secondary | ICD-10-CM | POA: Diagnosis not present

## 2021-05-13 DIAGNOSIS — Z96651 Presence of right artificial knee joint: Secondary | ICD-10-CM | POA: Diagnosis not present

## 2021-05-13 DIAGNOSIS — R2231 Localized swelling, mass and lump, right upper limb: Secondary | ICD-10-CM | POA: Diagnosis not present

## 2021-05-13 DIAGNOSIS — K5792 Diverticulitis of intestine, part unspecified, without perforation or abscess without bleeding: Secondary | ICD-10-CM | POA: Diagnosis not present

## 2021-05-13 DIAGNOSIS — M898X4 Other specified disorders of bone, hand: Secondary | ICD-10-CM | POA: Diagnosis not present

## 2021-05-13 DIAGNOSIS — M25511 Pain in right shoulder: Secondary | ICD-10-CM | POA: Diagnosis not present

## 2021-05-13 DIAGNOSIS — G8929 Other chronic pain: Secondary | ICD-10-CM | POA: Diagnosis not present

## 2021-05-17 ENCOUNTER — Telehealth: Payer: Self-pay

## 2021-05-17 NOTE — Chronic Care Management (AMB) (Signed)
Chronic Care Management Pharmacy Assistant   Name: KISSY CIELO  MRN: [id number] DOB: [birth year]   Reason for Encounter: General Adherence Call     Recent office visits:  None noted  Recent consult visits:  None noted  Hospital visits:  Medication Reconciliation was completed by comparing discharge summary, patient's EMR and Pharmacy list, and upon discussion with patient.  Admitted to the hospital on 03/19/21 due to Cold symptoms. Discharge date was 03/19/21. Discharged from [place]?Medications Started at [facility] Discharge:?? -started Lidocaine 5% Patch due to pain.  -All other medications will remain the same.      Medications: Outpatient Encounter Medications as of 05/17/2021  Medication Sig   acetaminophen (TYLENOL) 325 MG tablet Take 500 mg by mouth every 6 (six) hours as needed.   Adalimumab 40 MG/0.4ML PNKT Inject into the skin.   albuterol (VENTOLIN HFA) 108 (90 Base) MCG/ACT inhaler Inhale 2 puffs into the lungs every 6 (six) hours as needed for wheezing or shortness of breath.   alendronate (FOSAMAX) 70 MG tablet Take by mouth.   budesonide (RHINOCORT AQUA) 32 MCG/ACT nasal spray Place 1 spray into both nostrils daily. Reported on 02/04/2016   Calcium Carbonate-Vitamin D (CALCIUM + D PO) Take 1,200 mg by mouth daily.   cholecalciferol (VITAMIN D3) 25 MCG (1000 UNIT) tablet Take 1,000 Units by mouth in the morning, at noon, and at bedtime.   ciclopirox (PENLAC) 8 % solution APPLY TO TOENAIL EVERY NIGHT FOR 7 NIGHTS, ON 8TH NIGHT WIPE WITH ALCOHOL AND REPEAT   citalopram (CELEXA) 20 MG tablet Take 1 tablet (20 mg total) by mouth daily.   diazepam (VALIUM) 5 MG tablet One pill 30 minutes prior to mri. May repeat one pill in 30 minutes prn anxiety   diclofenac Sodium (VOLTAREN) 1 % GEL    folic acid (FOLVITE) 1 MG tablet Take 1 tablet by mouth daily.   gabapentin (NEURONTIN) 300 MG capsule Take 300 mg by mouth in the morning,  at noon, and at bedtime.   levothyroxine (SYNTHROID) 50 MCG tablet Take 1 tablet (50 mcg total) by mouth daily.   lidocaine (XYLOCAINE) 1 % (with preservative) injection by Infiltration route.   Magnesium 250 MG TABS Take 250 mg by mouth daily.   Melatonin 5 MG LOZG Take by mouth.   methotrexate (RHEUMATREX) 2.5 MG tablet Take 6 tablets (15 mg total) by mouth once a week. Takes 8 tablets (20 mg) once a week   Multiple Vitamin (MULTIVITAMIN) capsule Take 1 capsule by mouth daily.   Multiple Vitamins-Minerals (HAIR SKIN AND NAILS FORMULA PO) Take by mouth daily.   pantoprazole (PROTONIX) 40 MG tablet Take 1 tablet (40 mg total) by mouth daily.   predniSONE (DELTASONE) 2.5 MG tablet    promethazine-dextromethorphan (PROMETHAZINE-DM) 6.25-15 MG/5ML syrup Take 5 mLs by mouth 4 (four) times daily as needed for cough.   rosuvastatin (CRESTOR) 10 MG tablet TAKE 1 TABLET EVERY DAY   valACYclovir (VALTREX) 500 MG tablet daily as needed.    No facility-administered encounter medications on file as of 05/17/2021.   [place] for general disease state and medication adherence call.   Patient is not > 5 days past due for refill on the following medications per chart history:  [facility] Medications: Medication Name/mg Last Fill Days Supply Rosuvastatin 20 mg  04/05/21 90ds   What concerns do you have about your medications? Pt states she is having some pain that  goes down to her foot and was just seen by her knee surgeon but stated it is something with her back. Pt is now on Meloxicam to help with inflammation.  The patient denies side effects with her medications.   How often do you forget or accidentally miss a dose?   Do you use a pillbox? Pt states she is using one   Are you having any problems getting your medications from your pharmacy? Pt states she does not have any issues  Has the cost of your medications been a concern? Pt states she gets most things through mail order and  covers most of everything   Since last visit with CPP, no interventions have been made:   The patient has not had an ED visit since last contact.   The patient reports the following problems with their health. Pt states she isn't sleeping well due to pain in her knees and for restless legs   Patient states BP readings are as follows  DATE:             BP                05/16/21           124/64   Care Gaps: Last annual wellness visit? 8/41/32 If applicable: N/A Last eye exam / retinopathy screening? Diabetic foot exam?   [place]

## 2021-05-19 ENCOUNTER — Telehealth: Payer: Self-pay

## 2021-05-19 NOTE — Chronic Care Management (AMB) (Signed)
    Chronic Care Management Pharmacy Assistant   Name: DEINA LIPSEY  MRN: [id number] DOB: 1950/10/09   Reason for Encounter: HTN Call     Medications: Outpatient Encounter Medications as of 05/19/2021  Medication Sig   acetaminophen (TYLENOL) 325 MG tablet Take 500 mg by mouth every 6 (six) hours as needed.   Adalimumab 40 MG/0.4ML PNKT Inject into the skin.   albuterol (VENTOLIN HFA) 108 (90 Base) MCG/ACT inhaler Inhale 2 puffs into the lungs every 6 (six) hours as needed for wheezing or shortness of breath.   alendronate (FOSAMAX) 70 MG tablet Take by mouth.   budesonide (RHINOCORT AQUA) 32 MCG/ACT nasal spray Place 1 spray into both nostrils daily. Reported on 02/04/2016   Calcium Carbonate-Vitamin D (CALCIUM + D PO) Take 1,200 mg by mouth daily.   cholecalciferol (VITAMIN D3) 25 MCG (1000 UNIT) tablet Take 1,000 Units by mouth in the morning, at noon, and at bedtime.   ciclopirox (PENLAC) 8 % solution APPLY TO TOENAIL EVERY NIGHT FOR 7 NIGHTS, ON 8TH NIGHT WIPE WITH ALCOHOL AND REPEAT   citalopram (CELEXA) 20 MG tablet Take 1 tablet (20 mg total) by mouth daily.   diazepam (VALIUM) 5 MG tablet One pill 30 minutes prior to mri. May repeat one pill in 30 minutes prn anxiety   diclofenac Sodium (VOLTAREN) 1 % GEL    folic acid (FOLVITE) 1 MG tablet Take 1 tablet by mouth daily.   gabapentin (NEURONTIN) 300 MG capsule Take 300 mg by mouth in the morning, at noon, and at bedtime.   levothyroxine (SYNTHROID) 50 MCG tablet Take 1 tablet (50 mcg total) by mouth daily.   lidocaine (XYLOCAINE) 1 % (with preservative) injection by Infiltration route.   Magnesium 250 MG TABS Take 250 mg by mouth daily.   Melatonin 5 MG LOZG Take by mouth.   methotrexate (RHEUMATREX) 2.5 MG tablet Take 6 tablets (15 mg total) by mouth once a week. Takes 8 tablets (20 mg) once a week   Multiple Vitamin (MULTIVITAMIN) capsule Take 1 capsule by mouth daily.   Multiple Vitamins-Minerals (HAIR SKIN AND NAILS  FORMULA PO) Take by mouth daily.   pantoprazole (PROTONIX) 40 MG tablet Take 1 tablet (40 mg total) by mouth daily.   predniSONE (DELTASONE) 2.5 MG tablet    promethazine-dextromethorphan (PROMETHAZINE-DM) 6.25-15 MG/5ML syrup Take 5 mLs by mouth 4 (four) times daily as needed for cough.   rosuvastatin (CRESTOR) 10 MG tablet TAKE 1 TABLET EVERY DAY   valACYclovir (VALTREX) 500 MG tablet daily as needed.    No facility-administered encounter medications on file as of 05/19/2021.    Pt called me today and stated her BP readings were high today and wanted to report. Pt reported she had a headache  and knees were  hurting.  Pt states she had no caffeine and no meals while taking these readings. Pt states she takes Meloxicam 15 mg once daily . Which she states is helping with pain. Pt just wanted to call and report her BP since they were a little high.  She stated she still is not sleeping good due to the pain and  thought maybe this had something to do with her BP readings.   05/19/21 148/94 and 138/85 using an arm cuff before medicine.     [place]

## 2021-05-20 NOTE — Telephone Encounter (Signed)
Patient's BP is elevated, called patient. She told me she is busy but that she would try to call me back tomorrow. Will await her call

## 2021-05-21 NOTE — Telephone Encounter (Signed)
BP Elevated, will let PCP know

## 2021-05-25 DIAGNOSIS — G8929 Other chronic pain: Secondary | ICD-10-CM | POA: Insufficient documentation

## 2021-05-25 DIAGNOSIS — M25511 Pain in right shoulder: Secondary | ICD-10-CM | POA: Diagnosis not present

## 2021-05-25 DIAGNOSIS — M67911 Unspecified disorder of synovium and tendon, right shoulder: Secondary | ICD-10-CM | POA: Insufficient documentation

## 2021-05-25 DIAGNOSIS — M85811 Other specified disorders of bone density and structure, right shoulder: Secondary | ICD-10-CM | POA: Diagnosis not present

## 2021-05-25 DIAGNOSIS — M19011 Primary osteoarthritis, right shoulder: Secondary | ICD-10-CM | POA: Diagnosis not present

## 2021-05-25 HISTORY — DX: Other chronic pain: G89.29

## 2021-05-25 HISTORY — DX: Unspecified disorder of synovium and tendon, right shoulder: M67.911

## 2021-05-25 NOTE — Telephone Encounter (Signed)
Attempted to call pt. No answer, left VM stating we are calling regarding elevated BP and request call back.   Will attempt to call pt again before leaving clinic.   Olivia Cruz 05/25/21 4:23 PM

## 2021-05-25 NOTE — Telephone Encounter (Signed)
Called pt. Pt was leaving [place] office. BP at this office was 123/69. Spoke w/ Dr. Tobie Poet. Will hold off on losartan at this time and continue to monitor. Pt VU, will monitor.   Royce Macadamia, [place] 05/25/21 4:40 PM

## 2021-05-31 ENCOUNTER — Ambulatory Visit: Payer: Medicare HMO | Admitting: Gastroenterology

## 2021-06-04 DIAGNOSIS — M25611 Stiffness of right shoulder, not elsewhere classified: Secondary | ICD-10-CM | POA: Diagnosis not present

## 2021-06-04 DIAGNOSIS — G8929 Other chronic pain: Secondary | ICD-10-CM | POA: Diagnosis not present

## 2021-06-04 DIAGNOSIS — M25511 Pain in right shoulder: Secondary | ICD-10-CM | POA: Diagnosis not present

## 2021-06-04 DIAGNOSIS — M6281 Muscle weakness (generalized): Secondary | ICD-10-CM | POA: Diagnosis not present

## 2021-06-07 DIAGNOSIS — G8929 Other chronic pain: Secondary | ICD-10-CM | POA: Diagnosis not present

## 2021-06-07 DIAGNOSIS — M25511 Pain in right shoulder: Secondary | ICD-10-CM | POA: Diagnosis not present

## 2021-06-07 DIAGNOSIS — M6281 Muscle weakness (generalized): Secondary | ICD-10-CM | POA: Diagnosis not present

## 2021-06-07 DIAGNOSIS — M25611 Stiffness of right shoulder, not elsewhere classified: Secondary | ICD-10-CM | POA: Diagnosis not present

## 2021-06-09 ENCOUNTER — Ambulatory Visit (INDEPENDENT_AMBULATORY_CARE_PROVIDER_SITE_OTHER): Payer: Medicare HMO

## 2021-06-09 ENCOUNTER — Encounter: Payer: Self-pay | Admitting: Family Medicine

## 2021-06-09 DIAGNOSIS — L570 Actinic keratosis: Secondary | ICD-10-CM | POA: Diagnosis not present

## 2021-06-09 DIAGNOSIS — Z23 Encounter for immunization: Secondary | ICD-10-CM | POA: Diagnosis not present

## 2021-06-09 DIAGNOSIS — B351 Tinea unguium: Secondary | ICD-10-CM | POA: Diagnosis not present

## 2021-06-09 DIAGNOSIS — L820 Inflamed seborrheic keratosis: Secondary | ICD-10-CM | POA: Diagnosis not present

## 2021-06-11 DIAGNOSIS — M6281 Muscle weakness (generalized): Secondary | ICD-10-CM | POA: Diagnosis not present

## 2021-06-11 DIAGNOSIS — M25611 Stiffness of right shoulder, not elsewhere classified: Secondary | ICD-10-CM | POA: Diagnosis not present

## 2021-06-11 DIAGNOSIS — G8929 Other chronic pain: Secondary | ICD-10-CM | POA: Diagnosis not present

## 2021-06-11 DIAGNOSIS — M25511 Pain in right shoulder: Secondary | ICD-10-CM | POA: Diagnosis not present

## 2021-06-15 DIAGNOSIS — G8929 Other chronic pain: Secondary | ICD-10-CM | POA: Diagnosis not present

## 2021-06-15 DIAGNOSIS — M25511 Pain in right shoulder: Secondary | ICD-10-CM | POA: Diagnosis not present

## 2021-06-15 DIAGNOSIS — M25611 Stiffness of right shoulder, not elsewhere classified: Secondary | ICD-10-CM | POA: Diagnosis not present

## 2021-06-15 DIAGNOSIS — M6281 Muscle weakness (generalized): Secondary | ICD-10-CM | POA: Diagnosis not present

## 2021-06-21 ENCOUNTER — Ambulatory Visit (INDEPENDENT_AMBULATORY_CARE_PROVIDER_SITE_OTHER): Payer: Medicare HMO | Admitting: Family Medicine

## 2021-06-21 ENCOUNTER — Encounter: Payer: Self-pay | Admitting: Family Medicine

## 2021-06-21 VITALS — BP 120/72 | HR 92 | Temp 96.3°F | Resp 18 | Ht 60.5 in | Wt 153.6 lb

## 2021-06-21 DIAGNOSIS — J019 Acute sinusitis, unspecified: Secondary | ICD-10-CM

## 2021-06-21 DIAGNOSIS — J301 Allergic rhinitis due to pollen: Secondary | ICD-10-CM

## 2021-06-21 DIAGNOSIS — J018 Other acute sinusitis: Secondary | ICD-10-CM

## 2021-06-21 HISTORY — DX: Allergic rhinitis due to pollen: J30.1

## 2021-06-21 HISTORY — DX: Acute sinusitis, unspecified: J01.90

## 2021-06-21 LAB — POCT INFLUENZA A/B
Influenza A, POC: NEGATIVE
Influenza B, POC: NEGATIVE

## 2021-06-21 LAB — POC COVID19 BINAXNOW: SARS Coronavirus 2 Ag: NEGATIVE

## 2021-06-21 MED ORDER — AMOXICILLIN 875 MG PO TABS: 875.0000 mg | ORAL_TABLET | Freq: Two times a day (BID) | ORAL | 0 refills | Status: DC

## 2021-06-21 MED ORDER — BUDESONIDE 32 MCG/ACT NA SUSP: 1.0000 | Freq: Every day | NASAL | 3 refills | Status: AC

## 2021-06-21 NOTE — Progress Notes (Signed)
Acute Office Visit  Subjective:    Patient ID: Olivia Cruz, female    DOB: 05/12/1951, 70 y.o.   MRN: [id number]  Chief Complaint  Patient presents with   Otalgia   Headache   Nasal Congestion    HPI: Patient is in today for URI sxs. Complaining of rhinorrhea, sneezing, Left earache x 4 days. Sore throat Thursday and Friday. Frontal headache. No cough sob, cp, fever. Very fatigued. Covid negative. Flu negative.   Past Medical History:  Diagnosis Date   Allergic rhinoconjunctivitis    Anxiety    Asthma    Depression    Diverticulitis    GERD (gastroesophageal reflux disease)    Glaucoma    Hypothyroid    Insomnia    Laryngopharyngeal reflux (LPR)    Migraines    Osteoarthritis    Psoriatic arthritis ([place])    Skin cancer    STD (sexually transmitted disease)    HSV II    Past Surgical History:  Procedure Laterality Date   BELPHAROPTOSIS REPAIR     BREAST SURGERY Left    times 2   COLONOSCOPY  06/03/2013   Moderate predominantly sigmoid diverticulosis. Small interal hemorroids   FOOT SURGERY Right    Removed bone spur   GANGLION CYST EXCISION Left    foot   HEMORRHOID SURGERY     SKIN CANCER EXCISION     TOTAL KNEE ARTHROPLASTY Right 07/21/2020   UPPER GI ENDOSCOPY  03/23/2016   Mild gastritis, gastric polyps bxbenign squamous mucosa with no abnormaility, fundic glad poly in setting of mild chron gastritis.    Family History  Problem Relation Age of Onset   Breast cancer Sister        lung   Cancer Maternal Grandmother    Multiple births Maternal Grandmother    Multiple births Paternal Grandmother    Cancer Paternal Grandmother    Thyroid disease Mother    Heart failure Mother    Heart disease Mother    Hyperlipidemia Mother    Hypertension Mother    Stroke Mother    Depression Mother    Parkinson's disease Father    Glaucoma Father    Diverticulitis Father     Social History   Socioeconomic History   Marital status: Married     Spouse name: Not on file   Number of children: Not on file   Years of education: Not on file   Highest education level: Not on file  Occupational History   Not on file  Tobacco Use   Smoking status: Former    Types: Cigarettes    Quit date: 1986    Years since quitting: 36.8   Smokeless tobacco: Never  Vaping Use   Vaping Use: Never used  Substance and Sexual Activity   Alcohol use: No    Alcohol/week: 0.0 standard drinks   Drug use: No   Sexual activity: Yes    Partners: Male    Comment: husband vasectomy  Other Topics Concern   Not on file  Social History Narrative   Not on file   Social Determinants of Health   Financial Resource Strain: Low Risk    Difficulty of Paying Living Expenses: Not hard at all  Food Insecurity: No Food Insecurity   Worried About [place] fund[place] in the Last Year: Never true   [place] in the Last Year: Never true  Transportation Needs: No Transportation Needs   Lack of Transportation (Medical): No  Lack of Transportation (Non-Medical): No  Physical Activity: Sufficiently Active   Days of Exercise per Week: 5 days   Minutes of Exercise per Session: 30 min  Stress: No Stress Concern Present   Feeling of Stress : Only a little  Social Connections: Engineer, building services of Communication with Friends and Family: More than three times a week   Frequency of Social Gatherings with Friends and Family: More than three times a week   Attends Religious Services: More than 4 times per year   Active Member of [employer] or Organizations: Yes   Attends Music therapist: More than 4 times per year   Marital Status: Married  Human resources officer Violence: Not At Risk   Fear of Current or Ex-Partner: No   Emotionally Abused: No   Physically Abused: No   Sexually Abused: No    Outpatient Medications Prior to Visit  Medication Sig Dispense Refill   acetaminophen (TYLENOL) 325 MG tablet Take 500 mg by mouth every 6 (six) hours  as needed.     Adalimumab 40 MG/0.4ML PNKT Inject into the skin.     albuterol (VENTOLIN HFA) 108 (90 Base) MCG/ACT inhaler Inhale 2 puffs into the lungs every 6 (six) hours as needed for wheezing or shortness of breath. 8 g 2   alendronate (FOSAMAX) 70 MG tablet Take by mouth.     Calcium Carbonate-Vitamin D (CALCIUM + D PO) Take 1,200 mg by mouth daily.     cholecalciferol (VITAMIN D3) 25 MCG (1000 UNIT) tablet Take 1,000 Units by mouth in the morning, at noon, and at bedtime.     citalopram (CELEXA) 20 MG tablet Take 1 tablet (20 mg total) by mouth daily. 90 tablet 0   diazepam (VALIUM) 5 MG tablet One pill 30 minutes prior to mri. May repeat one pill in 30 minutes prn anxiety 5 tablet 0   diclofenac Sodium (VOLTAREN) 1 % GEL      folic acid (FOLVITE) 1 MG tablet Take 1 tablet by mouth daily.     gabapentin (NEURONTIN) 300 MG capsule Take 300 mg by mouth in the morning, at noon, and at bedtime.     levothyroxine (SYNTHROID) 50 MCG tablet Take 1 tablet (50 mcg total) by mouth daily. 90 tablet 1   Magnesium 250 MG TABS Take 250 mg by mouth daily.     Melatonin 5 MG LOZG Take by mouth.     methotrexate (RHEUMATREX) 2.5 MG tablet Take 6 tablets (15 mg total) by mouth once a week. Takes 8 tablets (20 mg) once a week 1 tablet 0   Multiple Vitamin (MULTIVITAMIN) capsule Take 1 capsule by mouth daily.     Multiple Vitamins-Minerals (HAIR SKIN AND NAILS FORMULA PO) Take by mouth daily.     pantoprazole (PROTONIX) 40 MG tablet Take 1 tablet (40 mg total) by mouth daily. 90 tablet 1   rosuvastatin (CRESTOR) 10 MG tablet TAKE 1 TABLET EVERY DAY 90 tablet 0   valACYclovir (VALTREX) 500 MG tablet daily as needed.      budesonide (RHINOCORT AQUA) 32 MCG/ACT nasal spray Place 1 spray into both nostrils daily. Reported on 02/04/2016     ciclopirox (PENLAC) 8 % solution APPLY TO TOENAIL EVERY NIGHT FOR 7 NIGHTS, ON 8TH NIGHT WIPE WITH ALCOHOL AND REPEAT     lidocaine (XYLOCAINE) 1 % (with preservative)  injection by Infiltration route.     predniSONE (DELTASONE) 2.5 MG tablet      promethazine-dextromethorphan (PROMETHAZINE-DM) 6.25-15 MG/5ML syrup  Take 5 mLs by mouth 4 (four) times daily as needed for cough. 118 mL 0   No facility-administered medications prior to visit.    Allergies  Allergen Reactions   Codeine Anaphylaxis and Nausea Only   Ambien [Zolpidem Tartrate] Other (See Comments)    Memory loss per patient   Augmentin [Amoxicillin-Pot Clavulanate] Nausea And Vomiting   Cyproheptadine Hcl Other (See Comments)    Confusion   Dilaudid [Hydromorphone]     hallucinations   Hydrocodone Other (See Comments)    anaphylaxis   Lisinopril    Sulfa Antibiotics Other (See Comments)    hives   Tramadol    Other Rash    Paper tape caused rash    Review of Systems  Constitutional:  Positive for fatigue. Negative for fever.  HENT:  Positive for congestion, ear pain, sinus pressure and sore throat.   Gastrointestinal:  Negative for nausea and vomiting.  Neurological:  Positive for headaches.  Psychiatric/Behavioral:  Negative for dysphoric mood. The patient is not nervous/anxious.       Objective:    Physical Exam Vitals reviewed.  Constitutional:      Appearance: Normal appearance. She is normal weight.  HENT:     Right Ear: Tympanic membrane, ear canal and external ear normal.     Left Ear: Tympanic membrane, ear canal and external ear normal.     Nose: Congestion and rhinorrhea present.     Mouth/Throat:     Pharynx: Oropharynx is clear. No oropharyngeal exudate or posterior oropharyngeal erythema.  Neck:     Vascular: No carotid bruit.  Cardiovascular:     Rate and Rhythm: Normal rate and regular rhythm.     Pulses: Normal pulses.     Heart sounds: Normal heart sounds.  Pulmonary:     Effort: Pulmonary effort is normal. No respiratory distress.     Breath sounds: Normal breath sounds.  Lymphadenopathy:     Cervical: No cervical adenopathy.  Neurological:      Mental Status: She is alert and oriented to person, place, and time.  Psychiatric:        Mood and Affect: Mood normal.        Behavior: Behavior normal.    BP 120/72   Pulse 92   Temp (!) 96.3 F (35.7 C)   Resp 18   Ht 5' 0.5" (1.537 m)   Wt 153 lb 9.6 oz (69.7 kg)   LMP 08/22/2005   BMI 29.50 kg/m  Wt Readings from Last 3 Encounters:  06/21/21 153 lb 9.6 oz (69.7 kg)  04/01/21 161 lb (73 kg)  03/22/21 162 lb 9.6 oz (73.8 kg)    Health Maintenance Due  Topic Date Due   Zoster Vaccines- Shingrix (2 of 2) 07/13/2018   COVID-19 Vaccine (5 - Booster for Pfizer series) 02/12/2021    There are no preventive care reminders to display for this patient.   Lab Results  Component Value Date   TSH 3.990 09/15/2020   Lab Results  Component Value Date   WBC 6.9 04/01/2021   HGB 14.1 04/01/2021   HCT 43.4 04/01/2021   MCV 92 04/01/2021   PLT 277 04/01/2021   Lab Results  Component Value Date   NA 145 (H) 04/01/2021   K 4.6 04/01/2021   CO2 24 04/01/2021   GLUCOSE 87 04/01/2021   BUN 11 04/01/2021   CREATININE 0.82 04/01/2021   BILITOT 0.3 04/01/2021   ALKPHOS 99 04/01/2021   AST 26 04/01/2021  ALT 25 04/01/2021   PROT 6.2 04/01/2021   ALBUMIN 4.1 04/01/2021   CALCIUM 9.6 04/01/2021   EGFR 76 04/01/2021   Lab Results  Component Value Date   CHOL 148 04/01/2021   Lab Results  Component Value Date   HDL 50 04/01/2021   Lab Results  Component Value Date   LDLCALC 74 04/01/2021   Lab Results  Component Value Date   TRIG 139 04/01/2021   Lab Results  Component Value Date   CHOLHDL 3.0 04/01/2021   Lab Results  Component Value Date   HGBA1C 6.2 (H) 04/01/2021       Assessment & Plan:   Problem List Items Addressed This Visit       Respiratory   Acute infection of nasal sinus - Primary    - symptoms and exam c/w sinusitis   - no evidence of AOM, CAP, strep pharyngitis, or other infection - given duration of symptoms, suspect bacterial  etiology - will treat with amoxicillin x 10 days - discussed symptomatic management (rhinocort, antihistamine), natural course, and return precautions  Rest and fluids.      Relevant Medications   budesonide (RHINOCORT AQUA) 32 MCG/ACT nasal spray   amoxicillin (AMOXIL) 875 MG tablet   Other Relevant Orders   POC COVID-19 (Completed)   POCT Influenza A/B (Completed)   Seasonal allergic rhinitis due to pollen    Rhinocort and antihistamine      Relevant Medications   budesonide (RHINOCORT AQUA) 32 MCG/ACT nasal spray   Meds ordered this encounter  Medications   budesonide (RHINOCORT AQUA) 32 MCG/ACT nasal spray    Sig: Place 1 spray into both nostrils daily. Reported on 02/04/2016    Dispense:  8.43 mL    Refill:  3   amoxicillin (AMOXIL) 875 MG tablet    Sig: Take 1 tablet (875 mg total) by mouth 2 (two) times daily.    Dispense:  20 tablet    Refill:  0    Orders Placed This Encounter  Procedures   POC COVID-19   POCT Influenza A/B     Follow-up: No follow-ups on file.  An After Visit Summary was printed and given to the patient.  Rochel Brome, MD [facility] [phone]

## 2021-06-21 NOTE — Assessment & Plan Note (Signed)
Rhinocort and antihistamine

## 2021-06-21 NOTE — Assessment & Plan Note (Addendum)
-   symptoms and exam c/w sinusitis   - no evidence of AOM, CAP, strep pharyngitis, or other infection - given duration of symptoms, suspect bacterial etiology - will treat with amoxicillin x 10 days - discussed symptomatic management (rhinocort, antihistamine), natural course, and return precautions  Rest and fluids.

## 2021-06-24 DIAGNOSIS — M6281 Muscle weakness (generalized): Secondary | ICD-10-CM | POA: Diagnosis not present

## 2021-06-24 DIAGNOSIS — M25511 Pain in right shoulder: Secondary | ICD-10-CM | POA: Diagnosis not present

## 2021-06-24 DIAGNOSIS — G8929 Other chronic pain: Secondary | ICD-10-CM | POA: Diagnosis not present

## 2021-06-25 DIAGNOSIS — Z1159 Encounter for screening for other viral diseases: Secondary | ICD-10-CM | POA: Diagnosis not present

## 2021-06-25 DIAGNOSIS — J329 Chronic sinusitis, unspecified: Secondary | ICD-10-CM | POA: Diagnosis not present

## 2021-06-25 DIAGNOSIS — L4050 Arthropathic psoriasis, unspecified: Secondary | ICD-10-CM | POA: Diagnosis not present

## 2021-06-25 DIAGNOSIS — Z79899 Other long term (current) drug therapy: Secondary | ICD-10-CM | POA: Diagnosis not present

## 2021-07-01 NOTE — Progress Notes (Deleted)
duplicate

## 2021-07-02 ENCOUNTER — Encounter: Payer: Self-pay | Admitting: Family Medicine

## 2021-07-02 ENCOUNTER — Ambulatory Visit (INDEPENDENT_AMBULATORY_CARE_PROVIDER_SITE_OTHER): Payer: Medicare HMO | Admitting: Family Medicine

## 2021-07-02 ENCOUNTER — Other Ambulatory Visit: Payer: Self-pay

## 2021-07-02 VITALS — BP 124/72 | HR 78 | Temp 97.2°F | Resp 16 | Wt 154.2 lb

## 2021-07-02 DIAGNOSIS — R300 Dysuria: Secondary | ICD-10-CM

## 2021-07-02 DIAGNOSIS — N3001 Acute cystitis with hematuria: Secondary | ICD-10-CM | POA: Diagnosis not present

## 2021-07-02 LAB — POCT URINALYSIS DIPSTICK
Bilirubin, UA: NEGATIVE
Glucose, UA: NEGATIVE
Ketones, UA: NEGATIVE
Protein, UA: POSITIVE — AB
Spec Grav, UA: 1.015 (ref 1.010–1.025)
Urobilinogen, UA: 0.2 E.U./dL
pH, UA: 6 (ref 5.0–8.0)

## 2021-07-02 MED ORDER — NITROFURANTOIN MONOHYD MACRO 100 MG PO CAPS: ORAL_CAPSULE | ORAL | 0 refills | Status: DC

## 2021-07-02 MED ORDER — PHENAZOPYRIDINE HCL 200 MG PO TABS: 200.0000 mg | ORAL_TABLET | Freq: Three times a day (TID) | ORAL | 0 refills | Status: DC | PRN

## 2021-07-02 NOTE — Assessment & Plan Note (Signed)
Prescription for Pyridium sent. Prescription for Macrobid 100 mg twice daily for 1 week.  Patient given extra Macrobid to take prior to intercourse. Urine culture sent.

## 2021-07-02 NOTE — Progress Notes (Signed)
Acute Office Visit  Subjective:    Patient ID: Olivia Cruz, female    DOB: [birth year], 70 y.o.   MRN: [id number]  Chief Complaint  Patient presents with   Urinary Tract Infection    HPI: Patient is in today for dysuria, strong smelling urine, and urgency which began yesterday.  Patient has history of urinary tract infections and used to take Macrobid after intercourse.  She and her husband recently had intercourse and she is concerned that was the cause.  No gross hematuria.  She does have some mid back pain.  No fevers or chills.  No nausea  Past Medical History:  Diagnosis Date   Allergic rhinoconjunctivitis    Anxiety    Asthma    Depression    Diverticulitis    GERD (gastroesophageal reflux disease)    Glaucoma    Hypothyroid    Insomnia    Laryngopharyngeal reflux (LPR)    Migraines    Osteoarthritis    Psoriatic arthritis ([place])    Skin cancer    STD (sexually transmitted disease)    HSV II    Past Surgical History:  Procedure Laterality Date   BELPHAROPTOSIS REPAIR     BREAST SURGERY Left    times 2   COLONOSCOPY  06/03/2013   Moderate predominantly sigmoid diverticulosis. Small interal hemorroids   FOOT SURGERY Right    Removed bone spur   GANGLION CYST EXCISION Left    foot   HEMORRHOID SURGERY     SKIN CANCER EXCISION     TOTAL KNEE ARTHROPLASTY Right 07/21/2020   UPPER GI ENDOSCOPY  03/23/2016   Mild gastritis, gastric polyps bxbenign squamous mucosa with no abnormaility, fundic glad poly in setting of mild chron gastritis.    Family History  Problem Relation Age of Onset   Breast cancer Sister        lung   Cancer Maternal Grandmother    Multiple births Maternal Grandmother    Multiple births Paternal Grandmother    Cancer Paternal Grandmother    Thyroid disease Mother    Heart failure Mother    Heart disease Mother    Hyperlipidemia Mother    Hypertension Mother    Stroke Mother    Depression Mother    Parkinson's disease Father     Glaucoma Father    Diverticulitis Father     Social History   Socioeconomic History   Marital status: Married    Spouse name: Not on file   Number of children: Not on file   Years of education: Not on file   Highest education level: Not on file  Occupational History   Not on file  Tobacco Use   Smoking status: Former    Types: Cigarettes    Quit date: 1986    Years since quitting: 36.8   Smokeless tobacco: Never  Vaping Use   Vaping Use: Never used  Substance and Sexual Activity   Alcohol use: No    Alcohol/week: 0.0 standard drinks   Drug use: No   Sexual activity: Yes    Partners: Male    Comment: husband vasectomy  Other Topics Concern   Not on file  Social History Narrative   Not on file   Social Determinants of Health   Financial Resource Strain: Low Risk    Difficulty of Paying Living Expenses: Not hard at all  Food Insecurity: No Food Insecurity   Worried About [place] in the Last Year: Never true  Ran Out of Food in the Last Year: Never true  Transportation Needs: No Transportation Needs   Lack of Transportation (Medical): No   Lack of Transportation (Non-Medical): No  Physical Activity: Sufficiently Active   Days of Exercise per Week: 5 days   Minutes of Exercise per Session: 30 min  Stress: No Stress Concern Present   Feeling of Stress : Only a little  Social Connections: Engineer, building services of Communication with Friends and Family: More than three times a week   Frequency of Social Gatherings with Friends and Family: More than three times a week   Attends Religious Services: More than 4 times per year   Active Member of [employer] or Organizations: Yes   Attends Music therapist: More than 4 times per year   Marital Status: Married  Human resources officer Violence: Not At Risk   Fear of Current or Ex-Partner: No   Emotionally Abused: No   Physically Abused: No   Sexually Abused: No    Outpatient Medications Prior  to Visit  Medication Sig Dispense Refill   acetaminophen (TYLENOL) 325 MG tablet Take 500 mg by mouth every 6 (six) hours as needed.     Adalimumab 40 MG/0.4ML PNKT Inject into the skin.     albuterol (VENTOLIN HFA) 108 (90 Base) MCG/ACT inhaler Inhale 2 puffs into the lungs every 6 (six) hours as needed for wheezing or shortness of breath. 8 g 2   alendronate (FOSAMAX) 70 MG tablet Take by mouth.     budesonide (RHINOCORT AQUA) 32 MCG/ACT nasal spray Place 1 spray into both nostrils daily. Reported on 02/04/2016 8.43 mL 3   Calcium Carbonate-Vitamin D (CALCIUM + D PO) Take 1,200 mg by mouth daily.     cholecalciferol (VITAMIN D3) 25 MCG (1000 UNIT) tablet Take 1,000 Units by mouth in the morning, at noon, and at bedtime.     citalopram (CELEXA) 20 MG tablet Take 1 tablet (20 mg total) by mouth daily. 90 tablet 0   diazepam (VALIUM) 5 MG tablet One pill 30 minutes prior to mri. May repeat one pill in 30 minutes prn anxiety 5 tablet 0   diclofenac Sodium (VOLTAREN) 1 % GEL      gabapentin (NEURONTIN) 300 MG capsule Take 300 mg by mouth in the morning, at noon, and at bedtime.     levothyroxine (SYNTHROID) 50 MCG tablet Take 1 tablet (50 mcg total) by mouth daily. 90 tablet 1   Magnesium 250 MG TABS Take 250 mg by mouth daily.     Melatonin 5 MG LOZG Take by mouth.     methotrexate (RHEUMATREX) 2.5 MG tablet Take 6 tablets (15 mg total) by mouth once a week. Takes 8 tablets (20 mg) once a week 1 tablet 0   Multiple Vitamin (MULTIVITAMIN) capsule Take 1 capsule by mouth daily.     Multiple Vitamins-Minerals (HAIR SKIN AND NAILS FORMULA PO) Take by mouth daily.     pantoprazole (PROTONIX) 40 MG tablet Take 1 tablet (40 mg total) by mouth daily. 90 tablet 1   rosuvastatin (CRESTOR) 10 MG tablet TAKE 1 TABLET EVERY DAY 90 tablet 0   valACYclovir (VALTREX) 500 MG tablet daily as needed.      amoxicillin (AMOXIL) 875 MG tablet Take 1 tablet (875 mg total) by mouth 2 (two) times daily. 20 tablet 0    folic acid (FOLVITE) 1 MG tablet Take 1 tablet by mouth daily.     No facility-administered medications prior to visit.  Allergies  Allergen Reactions   Codeine Anaphylaxis and Nausea Only   Ambien [Zolpidem Tartrate] Other (See Comments)    Memory loss per patient   Augmentin [Amoxicillin-Pot Clavulanate] Nausea And Vomiting   Cyproheptadine Hcl Other (See Comments)    Confusion   Dilaudid [Hydromorphone]     hallucinations   Hydrocodone Other (See Comments)    anaphylaxis   Lisinopril    Sulfa Antibiotics Other (See Comments)    hives   Tramadol    Other Rash    Paper tape caused rash    Review of Systems  Constitutional:  Positive for fatigue. Negative for chills and fever.  HENT:  Negative for congestion, rhinorrhea and sore throat.   Respiratory:  Negative for cough and shortness of breath.   Cardiovascular:  Negative for chest pain.  Gastrointestinal:  Negative for abdominal pain, constipation, diarrhea, nausea and vomiting.  Genitourinary:  Positive for dysuria. Negative for urgency.       Poor bladder control  Musculoskeletal:  Positive for back pain. Negative for myalgias.  Neurological:  Negative for dizziness, weakness, light-headedness and headaches.  Psychiatric/Behavioral:  Negative for dysphoric mood. The patient is not nervous/anxious.       Objective:    Physical Exam Vitals reviewed.  Constitutional:      Appearance: Normal appearance. She is normal weight.  Cardiovascular:     Rate and Rhythm: Normal rate and regular rhythm.     Heart sounds: Normal heart sounds.  Pulmonary:     Effort: Pulmonary effort is normal. No respiratory distress.     Breath sounds: Normal breath sounds.  Abdominal:     General: Abdomen is flat. Bowel sounds are normal.     Palpations: Abdomen is soft.     Tenderness: There is no abdominal tenderness.  Neurological:     Mental Status: She is alert and oriented to person, place, and time.  Psychiatric:        Mood  and Affect: Mood normal.        Behavior: Behavior normal.    BP 124/72   Pulse 78   Temp (!) 97.2 F (36.2 C)   Resp 16   Wt 154 lb 3.2 oz (69.9 kg)   LMP 08/22/2005   BMI 29.62 kg/m  Wt Readings from Last 3 Encounters:  07/02/21 154 lb 3.2 oz (69.9 kg)  06/21/21 153 lb 9.6 oz (69.7 kg)  04/01/21 161 lb (73 kg)    Health Maintenance Due  Topic Date Due   Zoster Vaccines- Shingrix (2 of 2) 07/13/2018   COVID-19 Vaccine (5 - Booster for Pfizer series) 02/12/2021    There are no preventive care reminders to display for this patient.   Lab Results  Component Value Date   TSH 3.990 09/15/2020   Lab Results  Component Value Date   WBC 6.9 04/01/2021   HGB 14.1 04/01/2021   HCT 43.4 04/01/2021   MCV 92 04/01/2021   PLT 277 04/01/2021   Lab Results  Component Value Date   NA 145 (H) 04/01/2021   K 4.6 04/01/2021   CO2 24 04/01/2021   GLUCOSE 87 04/01/2021   BUN 11 04/01/2021   CREATININE 0.82 04/01/2021   BILITOT 0.3 04/01/2021   ALKPHOS 99 04/01/2021   AST 26 04/01/2021   ALT 25 04/01/2021   PROT 6.2 04/01/2021   ALBUMIN 4.1 04/01/2021   CALCIUM 9.6 04/01/2021   EGFR 76 04/01/2021   Lab Results  Component Value Date   CHOL 148  04/01/2021   Lab Results  Component Value Date   HDL 50 04/01/2021   Lab Results  Component Value Date   LDLCALC 74 04/01/2021   Lab Results  Component Value Date   TRIG 139 04/01/2021   Lab Results  Component Value Date   CHOLHDL 3.0 04/01/2021   Lab Results  Component Value Date   HGBA1C 6.2 (H) 04/01/2021       Assessment & Plan:   Problem List Items Addressed This Visit       Genitourinary   Acute cystitis with hematuria - Primary    Prescription for Pyridium sent. Prescription for Macrobid 100 mg twice daily for 1 week.  Patient given extra Macrobid to take prior to intercourse. Urine culture sent.      Relevant Medications   nitrofurantoin, macrocrystal-monohydrate, (MACROBID) 100 MG capsule    phenazopyridine (PYRIDIUM) 200 MG tablet   Other Relevant Orders   POCT urinalysis dipstick (Completed)   Urine Culture   Meds ordered this encounter  Medications   nitrofurantoin, macrocrystal-monohydrate, (MACROBID) 100 MG capsule    Sig: One twice a day for one week. Then use one immediately following intercourse.    Dispense:  60 capsule    Refill:  0   phenazopyridine (PYRIDIUM) 200 MG tablet    Sig: Take 1 tablet (200 mg total) by mouth 3 (three) times daily as needed for pain.    Dispense:  12 tablet    Refill:  0    Orders Placed This Encounter  Procedures   Urine Culture   POCT urinalysis dipstick     Follow-up: Return if symptoms worsen or fail to improve.  An After Visit Summary was printed and given to the patient.  Rochel Brome, MD [facility] [phone]

## 2021-07-07 DIAGNOSIS — H25813 Combined forms of age-related cataract, bilateral: Secondary | ICD-10-CM | POA: Diagnosis not present

## 2021-07-08 ENCOUNTER — Telehealth: Payer: Self-pay

## 2021-07-08 ENCOUNTER — Other Ambulatory Visit: Payer: Self-pay | Admitting: Family Medicine

## 2021-07-08 LAB — URINE CULTURE

## 2021-07-08 MED ORDER — PHENAZOPYRIDINE HCL 200 MG PO TABS: 200.0000 mg | ORAL_TABLET | Freq: Three times a day (TID) | ORAL | 0 refills | Status: DC | PRN

## 2021-07-08 MED ORDER — CIPROFLOXACIN HCL 500 MG PO TABS: 500.0000 mg | ORAL_TABLET | Freq: Two times a day (BID) | ORAL | 0 refills | Status: AC

## 2021-07-08 NOTE — Telephone Encounter (Signed)
Patient left VM stating her infection is not getting better. She does not feel the macrobid helped and she is out of pyridium. She used to take Cipro when this happened in previous times and she requests more pyridium.   Checked w/ Beth, urine culture is preliminary. Given prelim results to Dr. Tobie Poet.   Dr will send in new antibiotic and pyridium.  Royce Macadamia, [place] 07/08/21 8:41 AM

## 2021-07-12 ENCOUNTER — Ambulatory Visit (INDEPENDENT_AMBULATORY_CARE_PROVIDER_SITE_OTHER): Payer: Medicare HMO | Admitting: Family Medicine

## 2021-07-12 ENCOUNTER — Encounter: Payer: Self-pay | Admitting: Family Medicine

## 2021-07-12 ENCOUNTER — Telehealth: Payer: Medicare HMO

## 2021-07-12 ENCOUNTER — Other Ambulatory Visit: Payer: Self-pay

## 2021-07-12 ENCOUNTER — Telehealth: Payer: Self-pay

## 2021-07-12 VITALS — BP 114/72 | HR 72 | Temp 96.0°F | Resp 16 | Ht 61.0 in | Wt 155.0 lb

## 2021-07-12 DIAGNOSIS — R300 Dysuria: Secondary | ICD-10-CM

## 2021-07-12 DIAGNOSIS — E782 Mixed hyperlipidemia: Secondary | ICD-10-CM | POA: Diagnosis not present

## 2021-07-12 DIAGNOSIS — E038 Other specified hypothyroidism: Secondary | ICD-10-CM | POA: Diagnosis not present

## 2021-07-12 DIAGNOSIS — R319 Hematuria, unspecified: Secondary | ICD-10-CM | POA: Diagnosis not present

## 2021-07-12 DIAGNOSIS — F330 Major depressive disorder, recurrent, mild: Secondary | ICD-10-CM

## 2021-07-12 DIAGNOSIS — R7303 Prediabetes: Secondary | ICD-10-CM | POA: Diagnosis not present

## 2021-07-12 DIAGNOSIS — G2581 Restless legs syndrome: Secondary | ICD-10-CM | POA: Diagnosis not present

## 2021-07-12 DIAGNOSIS — L4050 Arthropathic psoriasis, unspecified: Secondary | ICD-10-CM | POA: Diagnosis not present

## 2021-07-12 HISTORY — DX: Dysuria: R30.0

## 2021-07-12 HISTORY — DX: Major depressive disorder, recurrent, mild: F33.0

## 2021-07-12 LAB — POCT URINALYSIS DIP (CLINITEK)
Bilirubin, UA: NEGATIVE
Glucose, UA: NEGATIVE mg/dL
Ketones, POC UA: NEGATIVE mg/dL
Leukocytes, UA: NEGATIVE
Nitrite, UA: NEGATIVE
POC PROTEIN,UA: NEGATIVE
Spec Grav, UA: 1.02 (ref 1.010–1.025)
Urobilinogen, UA: 0.2 E.U./dL
pH, UA: 6 (ref 5.0–8.0)

## 2021-07-12 LAB — POCT UA - MICROALBUMIN: Microalbumin Ur, POC: 30 mg/L

## 2021-07-12 MED ORDER — VALACYCLOVIR HCL 500 MG PO TABS: 500.0000 mg | ORAL_TABLET | Freq: Three times a day (TID) | ORAL | 0 refills | Status: AC | PRN

## 2021-07-12 MED ORDER — PANTOPRAZOLE SODIUM 40 MG PO TBEC: 40.0000 mg | DELAYED_RELEASE_TABLET | Freq: Every day | ORAL | 3 refills | Status: DC

## 2021-07-12 NOTE — Telephone Encounter (Signed)
  Care Management   Follow Up Note   07/12/2021 Name: Olivia Cruz MRN: [id number] DOB: April 26, 1951   Referred by: Rochel Brome, MD Reason for referral : No chief complaint on file.   An unsuccessful telephone outreach was attempted today. The patient was referred to the case management team for assistance with care management and care coordination.   Follow Up Plan: The patient has been provided with contact information for the care management team and has been advised to call with any health related questions or concerns.  -Tried to call to complete PAP for 2023, will await call back from patient  [place] Constable, Pharm.D. - [phone]

## 2021-07-12 NOTE — Assessment & Plan Note (Signed)
The current medical regimen is effective;  continue present plan and medications.  

## 2021-07-12 NOTE — Addendum Note (Signed)
Addended by: Alan Ripper A on: 07/12/2021 04:12 PM   Modules accepted: Orders

## 2021-07-12 NOTE — Assessment & Plan Note (Signed)
Continue gabapentin.

## 2021-07-12 NOTE — Assessment & Plan Note (Signed)
Well controlled.  ?No changes to medicines.  ?Continue to work on eating a healthy diet and exercise.  ?Labs drawn today.  ?

## 2021-07-12 NOTE — Progress Notes (Signed)
Subjective:  Patient ID: Olivia Cruz, female    DOB: [birth year]  Age: 70 y.o. MRN: [id number]  Chief Complaint  Patient presents with   Hyperlipidemia   Hypothyroidism   Hypertension   HPI Patient is a 70 year old white female who presents for chronic follow-up of prediabetes, hyperlipidemia, Psoriatec arthritis, restless leg syndrome, and depression. Patient is currently on Crestor 10 mg once daily for her cholesterol.  Patient is eating healthy. Prediabetes is a new diagnosis 3 months ago.  Her A1c was 6.2 at that point.  She preferred not to start metformin was wanting to work on her diet.  She eats healthy and exercises (very active.) Most recently the patient had a urinary tract infection last week.  Macrobid although ended up being sensitive was not improving her symptoms.  I switched her to Cipro.  Which the bacteria was also sensitive to and she has improved.  She still has some dysuria.  Patient also had prescription for Pyridium.  GERD: Ran out of Protonix 40 mg once daily 4 days ago.  She would like to continue it.  She did not have any breakthrough reflux while she was off of the medication.  Patient has history of herpes.  She takes valacyclovir 500 mg once daily as needed but has not had any prescription for 2 years.  She would like a new prescription today.  Psoriatic arthritis: Patient sees Dr. Manuella Ghazi.  Methotrexate was discontinued and patient is going to be starting Humira in the next 2 weeks.  Osteoporosis patient has on Fosamax 70 mg once weekly.  Hypothyroidism: Currently on Synthroid 50 mcg once daily. Restless leg syndrome patient is currently on gabapentin 300 mg 3 times a day which is working well for her restless legs.  Current Outpatient Medications on File Prior to Visit  Medication Sig Dispense Refill   acetaminophen (TYLENOL) 325 MG tablet Take 500 mg by mouth every 6 (six) hours as needed.     Adalimumab 40 MG/0.4ML PNKT Inject into the skin.      albuterol (VENTOLIN HFA) 108 (90 Base) MCG/ACT inhaler Inhale 2 puffs into the lungs every 6 (six) hours as needed for wheezing or shortness of breath. 8 g 2   alendronate (FOSAMAX) 70 MG tablet Take by mouth.     budesonide (RHINOCORT AQUA) 32 MCG/ACT nasal spray Place 1 spray into both nostrils daily. Reported on 02/04/2016 8.43 mL 3   Calcium Carbonate-Vitamin D (CALCIUM + D PO) Take 1,200 mg by mouth daily.     cholecalciferol (VITAMIN D3) 25 MCG (1000 UNIT) tablet Take 1,000 Units by mouth in the morning, at noon, and at bedtime.     ciprofloxacin (CIPRO) 500 MG tablet Take 1 tablet (500 mg total) by mouth 2 (two) times daily for 7 days. 14 tablet 0   citalopram (CELEXA) 20 MG tablet Take 1 tablet (20 mg total) by mouth daily. 90 tablet 0   diazepam (VALIUM) 5 MG tablet One pill 30 minutes prior to mri. May repeat one pill in 30 minutes prn anxiety 5 tablet 0   diclofenac Sodium (VOLTAREN) 1 % GEL      gabapentin (NEURONTIN) 300 MG capsule Take 300 mg by mouth in the morning, at noon, and at bedtime.     levothyroxine (SYNTHROID) 50 MCG tablet Take 1 tablet (50 mcg total) by mouth daily. 90 tablet 1   Magnesium 250 MG TABS Take 250 mg by mouth daily.     Melatonin 5 MG LOZG Take by  mouth.     Multiple Vitamin (MULTIVITAMIN) capsule Take 1 capsule by mouth daily.     Multiple Vitamins-Minerals (HAIR SKIN AND NAILS FORMULA PO) Take by mouth daily.     phenazopyridine (PYRIDIUM) 200 MG tablet Take 1 tablet (200 mg total) by mouth 3 (three) times daily as needed for pain. 12 tablet 0   phenazopyridine (PYRIDIUM) 200 MG tablet Take 1 tablet (200 mg total) by mouth 3 (three) times daily as needed for pain. 15 tablet 0   rosuvastatin (CRESTOR) 10 MG tablet TAKE 1 TABLET EVERY DAY 90 tablet 0   No current facility-administered medications on file prior to visit.   Past Medical History:  Diagnosis Date   Allergic rhinoconjunctivitis    Anxiety    Asthma    Depression    Diverticulitis    GERD  (gastroesophageal reflux disease)    Glaucoma    Hypothyroid    Insomnia    Laryngopharyngeal reflux (LPR)    Migraines    Osteoarthritis    Psoriatic arthritis ([place])    Skin cancer    STD (sexually transmitted disease)    HSV II   Past Surgical History:  Procedure Laterality Date   BELPHAROPTOSIS REPAIR     BREAST SURGERY Left    times 2   COLONOSCOPY  06/03/2013   Moderate predominantly sigmoid diverticulosis. Small interal hemorroids   FOOT SURGERY Right    Removed bone spur   GANGLION CYST EXCISION Left    foot   HEMORRHOID SURGERY     SKIN CANCER EXCISION     TOTAL KNEE ARTHROPLASTY Right 07/21/2020   UPPER GI ENDOSCOPY  03/23/2016   Mild gastritis, gastric polyps bxbenign squamous mucosa with no abnormaility, fundic glad poly in setting of mild chron gastritis.    Family History  Problem Relation Age of Onset   Breast cancer Sister        lung   Cancer Maternal Grandmother    Multiple births Maternal Grandmother    Multiple births Paternal Grandmother    Cancer Paternal Grandmother    Thyroid disease Mother    Heart failure Mother    Heart disease Mother    Hyperlipidemia Mother    Hypertension Mother    Stroke Mother    Depression Mother    Parkinson's disease Father    Glaucoma Father    Diverticulitis Father    Social History   Socioeconomic History   Marital status: Married    Spouse name: Not on file   Number of children: Not on file   Years of education: Not on file   Highest education level: Not on file  Occupational History   Not on file  Tobacco Use   Smoking status: Former    Types: Cigarettes    Quit date: 1986    Years since quitting: 36.9   Smokeless tobacco: Never  Vaping Use   Vaping Use: Never used  Substance and Sexual Activity   Alcohol use: No    Alcohol/week: 0.0 standard drinks   Drug use: No   Sexual activity: Yes    Partners: Male    Comment: husband vasectomy  Other Topics Concern   Not on file  Social History  Narrative   Not on file   Social Determinants of Health   Financial Resource Strain: Low Risk    Difficulty of Paying Living Expenses: Not hard at all  Food Insecurity: No Food Insecurity   Worried About [place] in the Last  Year: Never true   [place] in the Last Year: Never true  Transportation Needs: No Transportation Needs   Lack of Transportation (Medical): No   Lack of Transportation (Non-Medical): No  Physical Activity: Sufficiently Active   Days of Exercise per Week: 5 days   Minutes of Exercise per Session: 30 min  Stress: No Stress Concern Present   Feeling of Stress : Only a little  Social Connections: Engineer, building services of Communication with Friends and Family: More than three times a week   Frequency of Social Gatherings with Friends and Family: More than three times a week   Attends Religious Services: More than 4 times per year   Active Member of [employer] or Organizations: Yes   Attends Music therapist: More than 4 times per year   Marital Status: Married    Review of Systems  Constitutional:  Negative for chills, fatigue and fever.  HENT:  Negative for congestion, rhinorrhea and sore throat.   Respiratory:  Negative for cough and shortness of breath.   Cardiovascular:  Negative for chest pain.  Gastrointestinal:  Negative for abdominal pain, constipation, diarrhea, nausea and vomiting.  Genitourinary:  Positive for dysuria. Negative for urgency.  Musculoskeletal:  Positive for arthralgias (right leg pain). Negative for back pain and myalgias.  Neurological:  Negative for dizziness, weakness, light-headedness and headaches.  Psychiatric/Behavioral:  Negative for dysphoric mood. The patient is not nervous/anxious.     Objective:  LMP 08/22/2005   BP/Weight 07/02/2021 06/21/2021 5/00/9381  Systolic BP 829 937 169  Diastolic BP 72 72 62  Wt. (Lbs) 154.2 153.6 161  BMI 29.62 29.5 30.42    Physical Exam Vitals  reviewed.  Constitutional:      Appearance: Normal appearance. She is normal weight.  Neck:     Vascular: No carotid bruit.  Cardiovascular:     Rate and Rhythm: Normal rate and regular rhythm.     Heart sounds: Normal heart sounds.  Pulmonary:     Effort: Pulmonary effort is normal. No respiratory distress.     Breath sounds: Normal breath sounds.  Abdominal:     General: Abdomen is flat. Bowel sounds are normal.     Palpations: Abdomen is soft.     Tenderness: There is no abdominal tenderness.  Neurological:     Mental Status: She is alert and oriented to person, place, and time.  Psychiatric:        Mood and Affect: Mood normal.        Behavior: Behavior normal.    Diabetic Foot Exam - Simple   No data filed      Lab Results  Component Value Date   WBC 6.9 04/01/2021   HGB 14.1 04/01/2021   HCT 43.4 04/01/2021   PLT 277 04/01/2021   GLUCOSE 87 04/01/2021   CHOL 148 04/01/2021   TRIG 139 04/01/2021   HDL 50 04/01/2021   LDLCALC 74 04/01/2021   ALT 25 04/01/2021   AST 26 04/01/2021   NA 145 (H) 04/01/2021   K 4.6 04/01/2021   CL 108 (H) 04/01/2021   CREATININE 0.82 04/01/2021   BUN 11 04/01/2021   CO2 24 04/01/2021   TSH 3.990 09/15/2020   HGBA1C 6.2 (H) 04/01/2021   MICROALBUR 30 07/12/2021      Assessment & Plan:   Problem List Items Addressed This Visit       Endocrine   Secondary hypothyroidism    The current medical regimen is  effective;  continue present plan and medications.       Relevant Orders   TSH     Musculoskeletal and Integument   Psoriatic arthritis ([place])    Management per specialist.          Other   Prediabetes - Primary    Recommend continue to work on eating healthy diet and exercise.        Relevant Orders   Hemoglobin A1c   POCT UA - Microalbumin (Completed)   Mixed hyperlipidemia    Well controlled.  No changes to medicines.  Continue to work on eating a healthy diet and exercise.  Labs drawn today.         Relevant Orders   CBC with Differential/Platelet   Comprehensive metabolic panel   Lipid panel   RLS (restless legs syndrome)    Continue gabapentin       Mild recurrent major depression ([place])    The current medical regimen is effective;  continue present plan and medications.       Dysuria    Send urinalysis off.      Relevant Orders   POCT URINALYSIS DIP (CLINITEK) (Completed)  .  Meds ordered this encounter  Medications   pantoprazole (PROTONIX) 40 MG tablet    Sig: Take 1 tablet (40 mg total) by mouth daily.    Dispense:  90 tablet    Refill:  3   valACYclovir (VALTREX) 500 MG tablet    Sig: Take 1 tablet (500 mg total) by mouth 3 (three) times daily as needed (herpes outbreak).    Dispense:  21 tablet    Refill:  0    Orders Placed This Encounter  Procedures   CBC with Differential/Platelet   Comprehensive metabolic panel   Lipid panel   Hemoglobin A1c   TSH   POCT URINALYSIS DIP (CLINITEK)   POCT UA - Microalbumin     Follow-up: Return in about 4 months (around 11/09/2021) for chronic fasting.  An After Visit Summary was printed and given to the patient.  Rochel Brome, MD [facility] [phone]

## 2021-07-12 NOTE — Assessment & Plan Note (Signed)
Management per specialist. 

## 2021-07-12 NOTE — Assessment & Plan Note (Signed)
Send urinalysis off.

## 2021-07-12 NOTE — Assessment & Plan Note (Signed)
Recommend continue to work on eating healthy diet and exercise.  

## 2021-07-13 LAB — CBC WITH DIFFERENTIAL/PLATELET
Basophils Absolute: 0.1 10*3/uL (ref 0.0–0.2)
Basos: 1 %
EOS (ABSOLUTE): 0.2 10*3/uL (ref 0.0–0.4)
Eos: 4 %
Hematocrit: 41.6 % (ref 34.0–46.6)
Hemoglobin: 14 g/dL (ref 11.1–15.9)
Immature Grans (Abs): 0 10*3/uL (ref 0.0–0.1)
Immature Granulocytes: 0 %
Lymphocytes Absolute: 1.5 10*3/uL (ref 0.7–3.1)
Lymphs: 28 %
MCH: 29.2 pg (ref 26.6–33.0)
MCHC: 33.7 g/dL (ref 31.5–35.7)
MCV: 87 fL (ref 79–97)
Monocytes Absolute: 0.5 10*3/uL (ref 0.1–0.9)
Monocytes: 9 %
Neutrophils Absolute: 3.1 10*3/uL (ref 1.4–7.0)
Neutrophils: 58 %
Platelets: 283 10*3/uL (ref 150–450)
RBC: 4.79 x10E6/uL (ref 3.77–5.28)
RDW: 13.1 % (ref 11.7–15.4)
WBC: 5.3 10*3/uL (ref 3.4–10.8)

## 2021-07-13 LAB — COMPREHENSIVE METABOLIC PANEL
ALT: 19 IU/L (ref 0–32)
AST: 28 IU/L (ref 0–40)
Albumin/Globulin Ratio: 2 (ref 1.2–2.2)
Albumin: 4.3 g/dL (ref 3.7–4.7)
Alkaline Phosphatase: 80 IU/L (ref 44–121)
BUN/Creatinine Ratio: 17 (ref 12–28)
BUN: 13 mg/dL (ref 8–27)
Bilirubin Total: 0.3 mg/dL (ref 0.0–1.2)
CO2: 23 mmol/L (ref 20–29)
Calcium: 9.8 mg/dL (ref 8.7–10.3)
Chloride: 104 mmol/L (ref 96–106)
Creatinine, Ser: 0.76 mg/dL (ref 0.57–1.00)
Globulin, Total: 2.1 g/dL (ref 1.5–4.5)
Glucose: 89 mg/dL (ref 70–99)
Potassium: 4.1 mmol/L (ref 3.5–5.2)
Sodium: 140 mmol/L (ref 134–144)
Total Protein: 6.4 g/dL (ref 6.0–8.5)
eGFR: 84 mL/min/{1.73_m2} (ref >59)

## 2021-07-13 LAB — LIPID PANEL
Chol/HDL Ratio: 2.2 ratio (ref 0.0–4.4)
Cholesterol, Total: 114 mg/dL (ref 100–199)
HDL: 52 mg/dL (ref >39)
LDL Chol Calc (NIH): 44 mg/dL (ref 0–99)
Triglycerides: 93 mg/dL (ref 0–149)
VLDL Cholesterol Cal: 18 mg/dL (ref 5–40)

## 2021-07-13 LAB — HEMOGLOBIN A1C
Est. average glucose Bld gHb Est-mCnc: 111 mg/dL
Hgb A1c MFr Bld: 5.5 % (ref 4.8–5.6)

## 2021-07-13 LAB — CARDIOVASCULAR RISK ASSESSMENT

## 2021-07-13 LAB — TSH: TSH: 3.74 u[IU]/mL (ref 0.450–4.500)

## 2021-07-13 NOTE — Progress Notes (Signed)
Blood count normal.  Liver function normal.  Kidney function normal.  Thyroid function normal.  Cholesterol: at goal HBA1C: greatly improved. 6.2 down to 5.5.

## 2021-07-15 ENCOUNTER — Other Ambulatory Visit: Payer: Self-pay | Admitting: Family Medicine

## 2021-07-15 LAB — URINE CULTURE: Organism ID, Bacteria: NO GROWTH

## 2021-07-17 DIAGNOSIS — J0100 Acute maxillary sinusitis, unspecified: Secondary | ICD-10-CM | POA: Diagnosis not present

## 2021-07-20 ENCOUNTER — Telehealth: Payer: Self-pay

## 2021-07-20 NOTE — Chronic Care Management (AMB) (Signed)
Chronic Care Management Pharmacy Assistant   Name: JULIAH SCADDEN  MRN: [id number] DOB: 04-30-1951   Reason for Encounter: Disease State call for HTN    Recent office visits:  07/12/21 Rochel Brome MD. Seen Prediabetes. Ordered Valacyclovir 500 mg 3 times daily prn and Pantoprazole 40 mg daily.   07/08/21 Orders Only. D/C Macrobid 100 mg due to being ineffective. Started Cipro 500 mg for 7 days and Pyridium 200 mg 3 times daily prn.   07/02/21 Rochel Brome MD Seen for Acute Cystitis with Hematuria. Started Macrobid 100 mg for one week. Started Phenazopyridine 200 mg 3 times daily prn. D/C Amoxicillin 875 mg 2 time daily and Folic Acid 1 mg daily due to completed course.   06/21/21 Rochel Brome MD. Seen for Sinusitis. Started on Amoxicillin 875 mg 2 times daily.   Recent consult visits:  05/25/21 (Orthopedics) Karn Pickler MD. Seen for shoulder pain. No med changes.   Hospital visits:  None   Medications: Outpatient Encounter Medications as of 07/20/2021  Medication Sig   acetaminophen (TYLENOL) 325 MG tablet Take 500 mg by mouth every 6 (six) hours as needed.   Adalimumab 40 MG/0.4ML PNKT Inject into the skin.   albuterol (VENTOLIN HFA) 108 (90 Base) MCG/ACT inhaler Inhale 2 puffs into the lungs every 6 (six) hours as needed for wheezing or shortness of breath.   alendronate (FOSAMAX) 70 MG tablet Take by mouth.   budesonide (RHINOCORT AQUA) 32 MCG/ACT nasal spray Place 1 spray into both nostrils daily. Reported on 02/04/2016   Calcium Carbonate-Vitamin D (CALCIUM + D PO) Take 1,200 mg by mouth daily.   cholecalciferol (VITAMIN D3) 25 MCG (1000 UNIT) tablet Take 1,000 Units by mouth in the morning, at noon, and at bedtime.   citalopram (CELEXA) 20 MG tablet Take 1 tablet (20 mg total) by mouth daily.   diazepam (VALIUM) 5 MG tablet One pill 30 minutes prior to mri. May repeat one pill in 30 minutes prn anxiety   diclofenac Sodium (VOLTAREN) 1 % GEL    gabapentin  (NEURONTIN) 300 MG capsule Take 300 mg by mouth in the morning, at noon, and at bedtime.   levothyroxine (SYNTHROID) 50 MCG tablet Take 1 tablet (50 mcg total) by mouth daily.   Magnesium 250 MG TABS Take 250 mg by mouth daily.   Melatonin 5 MG LOZG Take by mouth.   Multiple Vitamin (MULTIVITAMIN) capsule Take 1 capsule by mouth daily.   Multiple Vitamins-Minerals (HAIR SKIN AND NAILS FORMULA PO) Take by mouth daily.   pantoprazole (PROTONIX) 40 MG tablet Take 1 tablet (40 mg total) by mouth daily.   phenazopyridine (PYRIDIUM) 200 MG tablet Take 1 tablet (200 mg total) by mouth 3 (three) times daily as needed for pain.   phenazopyridine (PYRIDIUM) 200 MG tablet Take 1 tablet (200 mg total) by mouth 3 (three) times daily as needed for pain.   rosuvastatin (CRESTOR) 10 MG tablet TAKE 1 TABLET BY MOUTH EVERY DAY   valACYclovir (VALTREX) 500 MG tablet Take 1 tablet (500 mg total) by mouth 3 (three) times daily as needed (herpes outbreak).   No facility-administered encounter medications on file as of 07/20/2021.    Recent Office Vitals: BP Readings from Last 3 Encounters:  07/12/21 114/72  07/02/21 124/72  06/21/21 120/72   Pulse Readings from Last 3 Encounters:  07/12/21 72  07/02/21 78  06/21/21 92    Wt Readings from Last 3 Encounters:  07/12/21 155 lb (70.3 kg)  07/02/21  154 lb 3.2 oz (69.9 kg)  06/21/21 153 lb 9.6 oz (69.7 kg)     Kidney Function Lab Results  Component Value Date/Time   CREATININE 0.76 07/12/2021 08:03 AM   CREATININE 0.82 04/01/2021 09:58 AM   CREATININE 0.71 02/18/2013 10:29 AM   GFRNONAA 88 09/15/2020 11:01 AM   GFRAA 102 09/15/2020 11:01 AM    BMP Latest Ref Rng & Units 07/12/2021 04/01/2021 12/14/2020  Glucose 70 - 99 mg/dL 89 87 92  BUN 8 - 27 mg/dL 13 11 17   Creatinine 0.57 - 1.00 mg/dL 0.76 0.82 0.71  BUN/Creat Ratio 12 - 28 17 13 24   Sodium 134 - 144 mmol/L 140 145(H) 141  Potassium 3.5 - 5.2 mmol/L 4.1 4.6 4.3  Chloride 96 - 106 mmol/L 104  108(H) 105  CO2 20 - 29 mmol/L 23 24 19(L)  Calcium 8.7 - 10.3 mg/dL 9.8 9.6 9.8     Current antihypertensive regimen:  None   How often are you checking your Blood Pressure? daily  she checks her blood pressure in the morning after taking her medication.  Current home BP readings: 120/70 , 120/80   Wrist or arm cuff:Arm  Caffeine intake: Salt intake:Limited   Any readings above 180/120? No  What recent interventions/DTPs have been made by any provider to improve Blood Pressure control since last CPP Visit: Pt stated no changes  Any recent hospitalizations or ED visits since last visit with CPP? No  What diet changes have been made to improve Blood Pressure Control?  Pt has lost 10 lbs by eating low sugar foods and will continue on.   What exercise is being done to improve your Blood Pressure Control?  Pt tries to exercise and gets out and walks but her left knee bothers her so its hard   Adherence Review: Is the patient currently on ACE/ARB medication? No Does the patient have >5 day gap between last estimated fill dates? None   Pt will like to reschedule appt later when her husband gets back in town,   Care Gaps: Last annual wellness visit? None noted   Star Rating Drugs:  Medication:  Last Fill: Day Supply None   Elray Mcgregor, [place] Pharmacist Assistant  [phone]

## 2021-07-20 NOTE — Telephone Encounter (Signed)
Olivia Cruz called to report that she needs to be seen by Dr. Ellene Route.  She has been recommended to get an MRI then a referral.  She is going to discuss this with Rheumatology and call us back if she needs [place] to refer.

## 2021-07-21 ENCOUNTER — Ambulatory Visit: Payer: Medicare HMO

## 2021-07-26 DIAGNOSIS — T40605D Adverse effect of unspecified narcotics, subsequent encounter: Secondary | ICD-10-CM | POA: Diagnosis not present

## 2021-07-26 DIAGNOSIS — M1990 Unspecified osteoarthritis, unspecified site: Secondary | ICD-10-CM | POA: Diagnosis not present

## 2021-07-26 DIAGNOSIS — L4050 Arthropathic psoriasis, unspecified: Secondary | ICD-10-CM | POA: Diagnosis not present

## 2021-07-26 DIAGNOSIS — Z8719 Personal history of other diseases of the digestive system: Secondary | ICD-10-CM | POA: Diagnosis not present

## 2021-07-26 DIAGNOSIS — H04123 Dry eye syndrome of bilateral lacrimal glands: Secondary | ICD-10-CM | POA: Diagnosis not present

## 2021-07-26 DIAGNOSIS — R519 Headache, unspecified: Secondary | ICD-10-CM | POA: Diagnosis not present

## 2021-07-26 DIAGNOSIS — M62542 Muscle wasting and atrophy, not elsewhere classified, left hand: Secondary | ICD-10-CM | POA: Diagnosis not present

## 2021-07-26 DIAGNOSIS — T370X5D Adverse effect of sulfonamides, subsequent encounter: Secondary | ICD-10-CM | POA: Diagnosis not present

## 2021-07-26 DIAGNOSIS — Z85828 Personal history of other malignant neoplasm of skin: Secondary | ICD-10-CM | POA: Diagnosis not present

## 2021-07-27 DIAGNOSIS — M1712 Unilateral primary osteoarthritis, left knee: Secondary | ICD-10-CM | POA: Diagnosis not present

## 2021-07-30 DIAGNOSIS — M4726 Other spondylosis with radiculopathy, lumbar region: Secondary | ICD-10-CM | POA: Diagnosis not present

## 2021-07-30 DIAGNOSIS — M48061 Spinal stenosis, lumbar region without neurogenic claudication: Secondary | ICD-10-CM | POA: Diagnosis not present

## 2021-08-03 ENCOUNTER — Ambulatory Visit: Payer: Medicare HMO

## 2021-08-11 ENCOUNTER — Ambulatory Visit: Payer: Medicare HMO

## 2021-08-17 DIAGNOSIS — M1712 Unilateral primary osteoarthritis, left knee: Secondary | ICD-10-CM | POA: Diagnosis not present

## 2021-08-18 ENCOUNTER — Ambulatory Visit (INDEPENDENT_AMBULATORY_CARE_PROVIDER_SITE_OTHER): Payer: Medicare HMO

## 2021-08-18 ENCOUNTER — Other Ambulatory Visit: Payer: Self-pay

## 2021-08-18 DIAGNOSIS — Z23 Encounter for immunization: Secondary | ICD-10-CM

## 2021-08-18 NOTE — Progress Notes (Signed)
° °  Covid-19 Vaccination Clinic  Name:  Olivia Cruz    MRN: [id number] DOB: August 19, 1951  08/18/2021  Ms. Huizar was observed post Covid-19 immunization for 15 minutes without incident. She was provided with Vaccine Information Sheet and instruction to access the V-Safe system.   Ms. Derosa was instructed to call 911 with any severe reactions post vaccine: Difficulty breathing  Swelling of face and throat  A fast heartbeat  A bad rash all over body  Dizziness and weakness   Immunizations Administered     Name Date Dose VIS Date Route   Pfizer Covid-19 Vaccine Bivalent Booster 08/18/2021  9:14 AM 0.3 mL 04/21/2021 Intramuscular   Manufacturer: Time   Lot: [id number]   [place]: [phone]

## 2021-08-19 DIAGNOSIS — H43811 Vitreous degeneration, right eye: Secondary | ICD-10-CM | POA: Diagnosis not present

## 2021-08-25 DIAGNOSIS — G2581 Restless legs syndrome: Secondary | ICD-10-CM | POA: Diagnosis not present

## 2021-08-25 DIAGNOSIS — M62542 Muscle wasting and atrophy, not elsewhere classified, left hand: Secondary | ICD-10-CM | POA: Diagnosis not present

## 2021-08-25 DIAGNOSIS — R0683 Snoring: Secondary | ICD-10-CM | POA: Diagnosis not present

## 2021-08-25 DIAGNOSIS — G43009 Migraine without aura, not intractable, without status migrainosus: Secondary | ICD-10-CM | POA: Diagnosis not present

## 2021-08-30 ENCOUNTER — Telehealth: Payer: Self-pay

## 2021-08-30 ENCOUNTER — Other Ambulatory Visit: Payer: Self-pay | Admitting: Family Medicine

## 2021-08-30 MED ORDER — DULOXETINE HCL 60 MG PO CPEP: 60.0000 mg | ORAL_CAPSULE | Freq: Every day | ORAL | 0 refills | Status: DC

## 2021-08-30 NOTE — Telephone Encounter (Signed)
Olivia Cruz called to report that she has been feeling anxious and depressed.  Her current medication does not seem to be working well.  She has not been sleeping well because of her back pain .  Dr. Tobie Poet will send the change in medication.  She needs to schedule follow-up in 3 weeks.

## 2021-09-02 ENCOUNTER — Other Ambulatory Visit: Payer: Self-pay

## 2021-09-02 ENCOUNTER — Ambulatory Visit: Payer: Medicare Other | Admitting: Allergy and Immunology

## 2021-09-02 ENCOUNTER — Encounter: Payer: Self-pay | Admitting: Allergy and Immunology

## 2021-09-02 VITALS — BP 134/74 | HR 72 | Resp 16 | Ht 60.5 in | Wt 156.8 lb

## 2021-09-02 DIAGNOSIS — K219 Gastro-esophageal reflux disease without esophagitis: Secondary | ICD-10-CM

## 2021-09-02 DIAGNOSIS — J4530 Mild persistent asthma, uncomplicated: Secondary | ICD-10-CM

## 2021-09-02 DIAGNOSIS — B999 Unspecified infectious disease: Secondary | ICD-10-CM

## 2021-09-02 DIAGNOSIS — J3089 Other allergic rhinitis: Secondary | ICD-10-CM

## 2021-09-02 MED ORDER — FAMOTIDINE 40 MG PO TABS: 40.0000 mg | ORAL_TABLET | Freq: Every day | ORAL | 1 refills | Status: DC

## 2021-09-02 MED ORDER — PULMICORT FLEXHALER 180 MCG/ACT IN AEPB: INHALATION_SPRAY | RESPIRATORY_TRACT | 5 refills | Status: DC

## 2021-09-02 NOTE — Progress Notes (Signed)
[name] [place]   Follow-up Note  Referring Provider: Mercie Eon, MD Primary Provider: Rochel Brome, MD Date of Office Visit: 09/02/2021  Subjective:   Olivia Cruz (DOB: [birth year]) is a 71 y.o. female who returns to the [facility] on 09/02/2021 in re-evaluation of the following:  HPI: Olivia Cruz returns to this clinic in reevaluation of asthma and allergic rhinitis and LPR.  I have not seen her in this clinic since 24 May 2018.  She has had a lot going on since I have last seen her in this clinic.  She has been diagnosed with psoriatic arthritis and was treated for several years with methotrexate and discontinued that agent sometime in 2020 June and she just started Humira over the course of the past 6 weeks.  Apparently she has been having "sickness" which appears to be respiratory tract problems mostly involving her chest with cough.  She does have some nasal congestion on occasion and some intermittent ugly nasal discharge.  She states that she has had four antibiotics for respiratory tract issues in 2022.  She thinks that she has received systemic steroids for respiratory tract problems 3 times in 2022 as well.  In addition, she has received a systemic steroid for a prolonged issue involving her right knee in May and June and July 2022.    She also has a chronic cough.  In the past her chronic cough was a manifestation of inflammation of her airway and reflux and there was a point in time in which she had cough secondary to an ACE inhibitor.  She has apparently had extensive evaluation for her cough in the past including evaluation with ENT, imaging procedures of her lower airway, and an evaluation of issues associated with intermittent dyspnea including a cardiac catheterization which was apparently normal. She has used albuterol but it does not appear to help this issue very much.  She has a history of LPR and  currently she is treating this with a proton pump inhibitor without any additional agents.  She believes that this proton pump inhibitor is working quite well for her classic reflux symptoms.  She does not really consume any caffeine other than tea about twice a week does not consume any chocolate.  She has had 5 COVID vaccines and a flu vaccine and she was infected with COVID requiring a monoclonal antibody infusion in 2021  Allergies as of 09/02/2021       Reactions   Codeine Anaphylaxis, Nausea Only   Ambien [zolpidem Tartrate] Other (See Comments)   Memory loss per patient   Cyproheptadine Hcl Other (See Comments)   Confusion   Dilaudid [hydromorphone]    hallucinations   Hydrocodone Other (See Comments)   anaphylaxis   Lisinopril    Pramipexole Nausea Only   Sulfa Antibiotics Other (See Comments)   hives   Tramadol    Other Rash   Paper tape caused rash        Medication List    acetaminophen 325 MG tablet Commonly known as: TYLENOL Take 500 mg by mouth every 6 (six) hours as needed.   Adalimumab 40 MG/0.4ML Pnkt Inject into the skin.   albuterol 108 (90 Base) MCG/ACT inhaler Commonly known as: VENTOLIN HFA Inhale 2 puffs into the lungs every 6 (six) hours as needed for wheezing or shortness of breath.   budesonide 32 MCG/ACT nasal spray Commonly known as: RHINOCORT AQUA Place 1 spray into both  nostrils daily. Reported on 02/04/2016   CALCIUM + D PO Take 1,200 mg by mouth daily.   cholecalciferol 25 MCG (1000 UNIT) tablet Commonly known as: VITAMIN D3 Take 1,000 Units by mouth in the morning, at noon, and at bedtime.   diclofenac Sodium 1 % Gel Commonly known as: VOLTAREN   DULoxetine 60 MG capsule Commonly known as: Cymbalta Take 1 capsule (60 mg total) by mouth at bedtime.   gabapentin 300 MG capsule Commonly known as: NEURONTIN Take 300 mg by mouth in the morning, at noon, and at bedtime.   ibuprofen 200 MG tablet Commonly known as: ADVIL Take  400 mg by mouth as needed.   levothyroxine 50 MCG tablet Commonly known as: SYNTHROID Take 1 tablet (50 mcg total) by mouth daily.   Magnesium 250 MG Tabs Take 250 mg by mouth daily.   Melatonin 5 MG Lozg Take by mouth.   multivitamin capsule Take 1 capsule by mouth daily.   pantoprazole 40 MG tablet Commonly known as: PROTONIX Take 1 tablet (40 mg total) by mouth daily.   rosuvastatin 10 MG tablet Commonly known as: CRESTOR TAKE 1 TABLET BY MOUTH EVERY DAY   valACYclovir 500 MG tablet Commonly known as: VALTREX Take 1 tablet (500 mg total) by mouth 3 (three) times daily as needed (herpes outbreak).        Past Medical History:  Diagnosis Date   Allergic rhinoconjunctivitis    Anxiety    Asthma    Depression    Diverticulitis    GERD (gastroesophageal reflux disease)    Glaucoma    Hypothyroid    Insomnia    Laryngopharyngeal reflux (LPR)    Migraines    Osteoarthritis    Psoriatic arthritis ([place])    Skin cancer    STD (sexually transmitted disease)    HSV II    Past Surgical History:  Procedure Laterality Date   BELPHAROPTOSIS REPAIR     BREAST SURGERY Left    times 2   COLONOSCOPY  06/03/2013   Moderate predominantly sigmoid diverticulosis. Small interal hemorroids   FOOT SURGERY Right    Removed bone spur   GANGLION CYST EXCISION Left    foot   HEMORRHOID SURGERY     SKIN CANCER EXCISION     TOTAL KNEE ARTHROPLASTY Right 07/21/2020   UPPER GI ENDOSCOPY  03/23/2016   Mild gastritis, gastric polyps bxbenign squamous mucosa with no abnormaility, fundic glad poly in setting of mild chron gastritis.    Review of systems negative except as noted in HPI / PMHx or noted below:  Review of Systems  Constitutional: Negative.   HENT: Negative.    Eyes: Negative.   Respiratory: Negative.    Cardiovascular: Negative.   Gastrointestinal: Negative.   Genitourinary: Negative.   Musculoskeletal: Negative.   Skin: Negative.   Neurological: Negative.    Endo/Heme/Allergies: Negative.   Psychiatric/Behavioral: Negative.      Objective:   Vitals:   09/02/21 1401  BP: 134/74  Pulse: 72  Resp: 16  SpO2: 96%   Height: 5' 0.5" (153.7 cm)  Weight: 156 lb 12.8 oz (71.1 kg)   Physical Exam Constitutional:      Appearance: She is not diaphoretic.  HENT:     Head: Normocephalic.     Right Ear: Tympanic membrane, ear canal and external ear normal.     Left Ear: Tympanic membrane, ear canal and external ear normal.     Nose: Nose normal. No mucosal edema or rhinorrhea.  Mouth/Throat:     Pharynx: Uvula midline. No oropharyngeal exudate.  Eyes:     Conjunctiva/sclera: Conjunctivae normal.  Neck:     Thyroid: No thyromegaly.     Trachea: Trachea normal. No tracheal tenderness or tracheal deviation.  Cardiovascular:     Rate and Rhythm: Normal rate and regular rhythm.     Heart sounds: Normal heart sounds, S1 normal and S2 normal. No murmur heard. Pulmonary:     Effort: No respiratory distress.     Breath sounds: Normal breath sounds. No stridor. No wheezing or rales.  Lymphadenopathy:     Head:     Right side of head: No tonsillar adenopathy.     Left side of head: No tonsillar adenopathy.     Cervical: No cervical adenopathy.  Skin:    Findings: No erythema or rash.     Nails: There is no clubbing.  Neurological:     Mental Status: She is alert.    Diagnostics:    Spirometry was performed and demonstrated an FEV1 of 1.96 at 101 % of predicted.  Results of a chest CT scan angio obtained 19 March 2021 identifies the following:  Cardiovascular: There are no filling defects within the pulmonary  arteries to suggest pulmonary embolus. Normal caliber thoracic aorta  with mild tortuosity. No acute aortic findings. Heart size normal.  No pericardial effusion.   Mediastinum/Nodes: No enlarged mediastinal, hilar, or axillary lymph  nodes. The thyroid nodule decompressed esophagus.   Lungs/Pleura: Linear atelectasis within  the dependent lower lobes,  right middle lobe, and lingula. No pneumonia or confluent airspace  disease. No findings of pulmonary edema. No pleural effusion.  Trachea and central bronchi are patent. No pulmonary mass.    Results of a high-resolution chest CT scan obtained 04 May 2020 identifies the following:  Lungs/pleura: No pulmonary parenchymal disease, pleural effusion, or pneumothorax. No suspicious or dominant nodules. No mosaic attenuation is seen on expiratory phase CT.   Results of blood tests obtained for November 2022 identifies WBC 4.7, absolute eosinophil 200, absolute basophil 0, absolute lymphocyte 1900, hemoglobin 13.7, platelet 252.  Assessment and Plan:   1. Not well controlled mild persistent asthma   2. Perennial allergic rhinitis   3. LPRD (laryngopharyngeal reflux disease)   4. Recurrent infections     1.  Treat and prevent inflammation:  A. Pulmicort 180 - 2 inhalations 1 time per day B. Budesonide - 2 sprays each nostril 1 time per day  2.  Treat and prevent reflux/LPR:  A. Pantoprazole 40 mg - 1 tablet 2 times per day B. Famotidine 40 mg - 1 tablet in evening  3.  If needed:  A. Nasal saline B. OTC Cetirizine 10 mg - 1 tablet 1 time per day C. Albuterol HFA - 2 inhalations every 4-6 hours  4.  Blood - area 2 aeroallergen profile, IgA/G/M, antipneumococcal antibody assay, antitetanus IgG antibody  5. Return in 4 weeks or earlier if problem  Graciana has a chronically irritated respiratory track and we will treat her with anti-inflammatory agents and therapy directed against LPR as noted above and see what happens to her chronic symptomatology.  As well, she receives antibiotics and systemic steroids for respiratory tract infections on a common basis throughout the year and we will further investigate for a possible immune defect with the blood test noted above.  I will see her back in this clinic in 4 weeks or earlier if there is a problem.  Allena Katz, MD  Allergy / Immunology [facility]

## 2021-09-02 NOTE — Patient Instructions (Addendum)
°  1.  Treat and prevent inflammation:  A. Pulmicort 180 - 2 inhalations 1 time per day B. Budesonide - 2 sprays each nostril 1 time per day  2.  Treat and prevent reflux/LPR:  A. Pantoprazole 40 mg - 1 tablet 2 times per day B. Famotidine 40 mg - 1 tablet in evening  3.  If needed:  A. Nasal saline B. OTC Cetirizine 10 mg - 1 tablet 1 time per day C. Albuterol HFA - 2 inhalations every 4-6 hours  4.  Blood - area 2 aeroallergen profile, IgA/G/M, antipneumococcal antibody assay, antitetanus IgG antibody  5. Return in 4 weeks or earlier if problem

## 2021-09-06 ENCOUNTER — Encounter: Payer: Self-pay | Admitting: Allergy and Immunology

## 2021-09-06 ENCOUNTER — Telehealth: Payer: Self-pay | Admitting: Allergy and Immunology

## 2021-09-06 MED ORDER — PULMICORT FLEXHALER 180 MCG/ACT IN AEPB: INHALATION_SPRAY | RESPIRATORY_TRACT | 5 refills | Status: DC

## 2021-09-06 NOTE — Telephone Encounter (Signed)
Prescription has been sent in to [facility] in [place]

## 2021-09-06 NOTE — Telephone Encounter (Signed)
Patient is requesting we send in her inhaler to [facility] in [place]. [facility] is charging [facility] it but states [facility] is cheaper.

## 2021-09-07 ENCOUNTER — Telehealth: Payer: Self-pay

## 2021-09-07 NOTE — Chronic Care Management (AMB) (Signed)
Chronic Care Management Pharmacy Assistant   Name: Olivia Cruz  MRN: [id number] DOB: 04-Sep-1950   Reason for Encounter: General Adherence Call    Recent office visits:  08/30/21 Rochel Brome MD. Orders Only. D/C Citalopram 20 mg. Started on Duloxetine 60 mg daily at bedtime.   Recent consult visits:  09[facility]/21 [facility] Allena Katz MD. Seen for Asthma. Started on Famotidine 40 mg daily at bedtime. Started on Pulmicort Inhaler 180 mcg/act once daily. D/C the following meds: Alendronate Sodium 70 mg, Citalopram Hydrobromide 20 mg daily, Diazepam 5 mg, Meloxicam 15 mg daily, Phenazopyridine HCI 200 mg 3 times daily prn.  08/25/21 (Neurology) Maurie Boettcher MD. Seen for Migraine.D/C Tylenol 500 mg, Amoxicillin 347 mg, Folic Acid 1 mg, Meloxicam 15 mg and Macrobid 100 mg.   07/30/21 No notes available   07/27/21 No notes available   07/26/21 No notes available   Hospital visits:  None   Medications: Outpatient Encounter Medications as of 09/07/2021  Medication Sig Note   acetaminophen (TYLENOL) 325 MG tablet Take 500 mg by mouth every 6 (six) hours as needed. (Patient not taking: Reported on 09/02/2021)    Adalimumab 40 MG/0.4ML PNKT Inject into the skin.    albuterol (VENTOLIN HFA) 108 (90 Base) MCG/ACT inhaler Inhale 2 puffs into the lungs every 6 (six) hours as needed for wheezing or shortness of breath.    budesonide (RHINOCORT AQUA) 32 MCG/ACT nasal spray Place 1 spray into both nostrils daily. Reported on 02/04/2016 09/02/2021: Taking as needed.   Calcium Carbonate-Vitamin D (CALCIUM + D PO) Take 1,200 mg by mouth daily.    cholecalciferol (VITAMIN D3) 25 MCG (1000 UNIT) tablet Take 1,000 Units by mouth in the morning, at noon, and at bedtime.    diclofenac Sodium (VOLTAREN) 1 % GEL     DULoxetine (CYMBALTA) 60 MG capsule Take 1 capsule (60 mg total) by mouth at bedtime.    famotidine (PEPCID) 40 MG tablet Take 1 tablet (40 mg total) by mouth at bedtime.     gabapentin (NEURONTIN) 300 MG capsule Take 300 mg by mouth in the morning, at noon, and at bedtime.    ibuprofen (ADVIL) 200 MG tablet Take 400 mg by mouth as needed.    levothyroxine (SYNTHROID) 50 MCG tablet Take 1 tablet (50 mcg total) by mouth daily.    Magnesium 250 MG TABS Take 250 mg by mouth daily.    Melatonin 5 MG LOZG Take by mouth.    Multiple Vitamin (MULTIVITAMIN) capsule Take 1 capsule by mouth daily.    pantoprazole (PROTONIX) 40 MG tablet Take 1 tablet (40 mg total) by mouth daily.    PULMICORT FLEXHALER 180 MCG/ACT inhaler Inhale two doses once daily to prevent cough or wheeze.  Rinse, gargle, and spit after use.    rosuvastatin (CRESTOR) 10 MG tablet TAKE 1 TABLET BY MOUTH EVERY DAY    valACYclovir (VALTREX) 500 MG tablet Take 1 tablet (500 mg total) by mouth 3 (three) times daily as needed (herpes outbreak).    No facility-administered encounter medications on file as of 09/07/2021.   [facility] for general disease state and medication adherence call.   Patient is not > 5 days past due for refill on the following medications per chart history:  [facility] Medications: Medication Name/mg Last Fill Days Supply Rosuvastatin 10 mg  08/30/21  90ds     06/12/21 90ds  What concerns do you have about your medications? Pt denies any concerns  The patient denies side effects with her medications.   How often do you forget or accidentally miss a dose? Never  Do you use a pillbox? Yes  Are you having any problems getting your medications from your pharmacy? No  Has the cost of your medications been a concern? No If yes, what medication and is patient assistance available or has it been applied for?  Since last visit with CPP, the following interventions have been made: Pt stated she just started on Duloxetine 60 mg due to depression and pain   The patient has not had an ED visit since last contact.   The patient reports the following problems with their  health. Pt stated she is having a hard time sleeping at night due to increased pain and she tosses and turns all night  she denies  concerns or questions for [place], at this time.    Care Gaps: Last annual wellness visit? None noted  Mammogram: 04/28/21 Dexa Scan: 12/15/20 Colonoscopy: 12/07/17 Last eye exam / retinopathy screening? None noted  Diabetic foot exam?None noted   Elray Mcgregor, [place] Pharmacist Assistant  [phone]

## 2021-09-08 DIAGNOSIS — D485 Neoplasm of uncertain behavior of skin: Secondary | ICD-10-CM | POA: Diagnosis not present

## 2021-09-08 NOTE — Telephone Encounter (Signed)
Albuterol is expensive for patient, called and let her know about GoodRx. Able to get $20 vs $100 quoted from previous pharmacy

## 2021-09-09 NOTE — Telephone Encounter (Signed)
Lets have her use Flovent 110-2 inhalations twice a day

## 2021-09-09 NOTE — Telephone Encounter (Signed)
Olivia Cruz called back stating that her Pulmicort is still too expensive, she said the pharmacy told her it is because it's not covered by her insurance. As best as I can tell, brand Flovent seems to be covered, please advise.

## 2021-09-13 MED ORDER — FLOVENT HFA 110 MCG/ACT IN AERO: INHALATION_SPRAY | RESPIRATORY_TRACT | 5 refills | Status: DC

## 2021-09-13 NOTE — Telephone Encounter (Signed)
Informed patient of change in therapy.  [place] [facility] [address].  I asked [place] to let us know if the Flovent is not covered.

## 2021-09-13 NOTE — Addendum Note (Signed)
Addended by: Zandra Abts on: 09/13/2021 09:00 AM   Modules accepted: Orders

## 2021-09-15 LAB — STREP PNEUMONIAE 23 SEROTYPES IGG
Pneumo Ab Type 1*: 7.3 ug/mL (ref >1.3)
Pneumo Ab Type 12 (12F)*: <0.1 ug/mL — ABNORMAL LOW (ref >1.3)
Pneumo Ab Type 14*: 1.5 ug/mL (ref >1.3)
Pneumo Ab Type 17 (17F)*: 0.5 ug/mL — ABNORMAL LOW (ref >1.3)
Pneumo Ab Type 19 (19F)*: 5.5 ug/mL (ref >1.3)
Pneumo Ab Type 2*: >20.7 ug/mL (ref >1.3)
Pneumo Ab Type 20*: 30.2 ug/mL (ref >1.3)
Pneumo Ab Type 22 (22F)*: 1.5 ug/mL (ref >1.3)
Pneumo Ab Type 23 (23F)*: 3.1 ug/mL (ref >1.3)
Pneumo Ab Type 26 (6B)*: 7 ug/mL (ref >1.3)
Pneumo Ab Type 3*: 0.5 ug/mL — ABNORMAL LOW (ref >1.3)
Pneumo Ab Type 34 (10A)*: 4.8 ug/mL (ref >1.3)
Pneumo Ab Type 4*: 0.3 ug/mL — ABNORMAL LOW (ref >1.3)
Pneumo Ab Type 43 (11A)*: 1.2 ug/mL — ABNORMAL LOW (ref >1.3)
Pneumo Ab Type 5*: 1.4 ug/mL (ref >1.3)
Pneumo Ab Type 51 (7F)*: 0.5 ug/mL — ABNORMAL LOW (ref >1.3)
Pneumo Ab Type 54 (15B)*: 5 ug/mL (ref >1.3)
Pneumo Ab Type 56 (18C)*: 1.8 ug/mL (ref >1.3)
Pneumo Ab Type 57 (19A)*: 2.1 ug/mL (ref >1.3)
Pneumo Ab Type 68 (9V)*: 0.8 ug/mL — ABNORMAL LOW (ref >1.3)
Pneumo Ab Type 70 (33F)*: 3.6 ug/mL (ref >1.3)
Pneumo Ab Type 8*: 23.3 ug/mL (ref >1.3)
Pneumo Ab Type 9 (9N)*: 11.9 ug/mL (ref >1.3)

## 2021-09-15 LAB — IGG, IGA, IGM
IgA/Immunoglobulin A, Serum: 213 mg/dL (ref 64–422)
IgG (Immunoglobin G), Serum: 820 mg/dL (ref 586–1602)
IgM (Immunoglobulin M), Srm: 101 mg/dL (ref 26–217)

## 2021-09-15 LAB — ALLERGENS W/TOTAL IGE AREA 2

## 2021-09-15 LAB — DIPHTHERIA / TETANUS ANTIBODY PANEL
Diphtheria Ab: 0.24 IU/mL (ref <0.10)
Tetanus Ab, IgG: 0.96 IU/mL (ref <0.10)

## 2021-09-16 DIAGNOSIS — H43811 Vitreous degeneration, right eye: Secondary | ICD-10-CM | POA: Diagnosis not present

## 2021-09-20 DIAGNOSIS — M1712 Unilateral primary osteoarthritis, left knee: Secondary | ICD-10-CM | POA: Diagnosis not present

## 2021-09-23 ENCOUNTER — Other Ambulatory Visit: Payer: Self-pay

## 2021-09-23 ENCOUNTER — Encounter: Payer: Self-pay | Admitting: Family Medicine

## 2021-09-23 ENCOUNTER — Ambulatory Visit (INDEPENDENT_AMBULATORY_CARE_PROVIDER_SITE_OTHER): Payer: Medicare Other | Admitting: Family Medicine

## 2021-09-23 VITALS — BP 128/76 | HR 72 | Temp 96.8°F | Resp 18 | Ht 60.5 in | Wt 153.0 lb

## 2021-09-23 DIAGNOSIS — M545 Low back pain, unspecified: Secondary | ICD-10-CM

## 2021-09-23 DIAGNOSIS — F330 Major depressive disorder, recurrent, mild: Secondary | ICD-10-CM | POA: Diagnosis not present

## 2021-09-23 DIAGNOSIS — G8929 Other chronic pain: Secondary | ICD-10-CM | POA: Insufficient documentation

## 2021-09-23 DIAGNOSIS — M549 Dorsalgia, unspecified: Secondary | ICD-10-CM | POA: Insufficient documentation

## 2021-09-23 HISTORY — DX: Other chronic pain: G89.29

## 2021-09-23 MED ORDER — DULOXETINE HCL 60 MG PO CPEP: 60.0000 mg | ORAL_CAPSULE | Freq: Two times a day (BID) | ORAL | 0 refills | Status: DC

## 2021-09-23 NOTE — Progress Notes (Signed)
Subjective:  Patient ID: [name], female    DOB: 18-Feb-1951  Age: 71 y.o. MRN: [id number]  Chief Complaint  Patient presents with   Depression    HPI  Olivia Cruz comes in for follow-up on Cymbalta 60 mg qhs.  She has seen improvement in her symptoms.  She continues to have problems falling asleep but she thinks this is because of her pain in her back and knee. Seeing Dr. Samule Dry for hyaluronic injections in left knee. Patient has appointment with Dr. Ellene Route on Wednesday.   PHQ9 SCORE ONLY 09/23/2021 07/12/2021 06/21/2021  PHQ-9 Total Score 5 0 0   Discouraged by lack of sex between she and her husband. Patient's husband has tried cialis which is not helping. Has even taken up to 3 pills. Patient's husband has not had testosterone levels checked.   Current Outpatient Medications on File Prior to Visit  Medication Sig Dispense Refill   Adalimumab 40 MG/0.4ML PNKT Inject into the skin.     albuterol (VENTOLIN HFA) 108 (90 Base) MCG/ACT inhaler Inhale 2 puffs into the lungs every 6 (six) hours as needed for wheezing or shortness of breath. 8 g 2   budesonide (RHINOCORT AQUA) 32 MCG/ACT nasal spray Place 1 spray into both nostrils daily. Reported on 02/04/2016 8.43 mL 3   Calcium Carbonate-Vitamin D (CALCIUM + D PO) Take 1,200 mg by mouth daily.     cholecalciferol (VITAMIN D3) 25 MCG (1000 UNIT) tablet Take 1,000 Units by mouth in the morning, at noon, and at bedtime.     diclofenac Sodium (VOLTAREN) 1 % GEL      famotidine (PEPCID) 40 MG tablet Take 1 tablet (40 mg total) by mouth at bedtime. 30 tablet 1   FLOVENT HFA 110 MCG/ACT inhaler Inhale two puffs twice daily to prevent cough or wheeze.  Rinse, gargle, and spit after use. 12 g 5   gabapentin (NEURONTIN) 300 MG capsule Take 300 mg by mouth in the morning, at noon, and at bedtime.     ibuprofen (ADVIL) 200 MG tablet Take 400 mg by mouth as needed.     levothyroxine (SYNTHROID) 50 MCG tablet Take 1 tablet (50 mcg total) by mouth  daily. 90 tablet 1   Magnesium 250 MG TABS Take 250 mg by mouth daily.     Melatonin 5 MG LOZG Take by mouth.     Multiple Vitamin (MULTIVITAMIN) capsule Take 1 capsule by mouth daily.     pantoprazole (PROTONIX) 40 MG tablet Take 1 tablet (40 mg total) by mouth daily. 90 tablet 3   rosuvastatin (CRESTOR) 10 MG tablet TAKE 1 TABLET BY MOUTH EVERY DAY 90 tablet 0   valACYclovir (VALTREX) 500 MG tablet Take 1 tablet (500 mg total) by mouth 3 (three) times daily as needed (herpes outbreak). 21 tablet 0   No current facility-administered medications on file prior to visit.   Past Medical History:  Diagnosis Date   Allergic rhinoconjunctivitis    Anxiety    Asthma    Depression    Diverticulitis    GERD (gastroesophageal reflux disease)    Glaucoma    Hypothyroid    Insomnia    Laryngopharyngeal reflux (LPR)    Migraines    Osteoarthritis    Psoriatic arthritis ([place])    Skin cancer    STD (sexually transmitted disease)    HSV II   Past Surgical History:  Procedure Laterality Date   BELPHAROPTOSIS REPAIR     BREAST SURGERY Left  times 2   COLONOSCOPY  06/03/2013   Moderate predominantly sigmoid diverticulosis. Small interal hemorroids   FOOT SURGERY Right    Removed bone spur   GANGLION CYST EXCISION Left    foot   HEMORRHOID SURGERY     SKIN CANCER EXCISION     TOTAL KNEE ARTHROPLASTY Right 07/21/2020   UPPER GI ENDOSCOPY  03/23/2016   Mild gastritis, gastric polyps bxbenign squamous mucosa with no abnormaility, fundic glad poly in setting of mild chron gastritis.    Family History  Problem Relation Age of Onset   Breast cancer Sister        lung   Cancer Maternal Grandmother    Multiple births Maternal Grandmother    Multiple births Paternal Grandmother    Cancer Paternal Grandmother    Thyroid disease Mother    Heart failure Mother    Heart disease Mother    Hyperlipidemia Mother    Hypertension Mother    Stroke Mother    Depression Mother    Parkinson's  disease Father    Glaucoma Father    Diverticulitis Father    Social History   Socioeconomic History   Marital status: Married    Spouse name: Not on file   Number of children: Not on file   Years of education: Not on file   Highest education level: Not on file  Occupational History   Not on file  Tobacco Use   Smoking status: Former    Types: Cigarettes    Quit date: 1986    Years since quitting: 37.1   Smokeless tobacco: Never  Vaping Use   Vaping Use: Never used  Substance and Sexual Activity   Alcohol use: No    Alcohol/week: 0.0 standard drinks   Drug use: No   Sexual activity: Yes    Partners: Male    Comment: husband vasectomy  Other Topics Concern   Not on file  Social History Narrative   Not on file   Social Determinants of Health   Financial Resource Strain: Low Risk    Difficulty of Paying Living Expenses: Not hard at all  Food Insecurity: No Food Insecurity   Worried About Charity fundraiser in the Last Year: Never true   [place] in the Last Year: Never true  Transportation Needs: No Transportation Needs   Lack of Transportation (Medical): No   Lack of Transportation (Non-Medical): No  Physical Activity: Sufficiently Active   Days of Exercise per Week: 5 days   Minutes of Exercise per Session: 30 min  Stress: No Stress Concern Present   Feeling of Stress : Only a little  Social Connections: Engineer, building services of Communication with Friends and Family: More than three times a week   Frequency of Social Gatherings with Friends and Family: More than three times a week   Attends Religious Services: More than 4 times per year   Active Member of [employer] or Organizations: Yes   Attends Music therapist: More than 4 times per year   Marital Status: Married    Review of Systems  Constitutional:  Negative for chills, fatigue and fever.  HENT:  Negative for congestion, rhinorrhea and sore throat.   Respiratory:  Negative  for cough and shortness of breath.   Cardiovascular:  Negative for chest pain.  Gastrointestinal:  Negative for abdominal pain, constipation, diarrhea, nausea and vomiting.  Genitourinary:  Negative for dysuria and urgency.  Musculoskeletal:  Positive for arthralgias (left  knee) and back pain. Negative for myalgias.       Right leg pain   Neurological:  Negative for dizziness, weakness, light-headedness and headaches.  Psychiatric/Behavioral:  Negative for dysphoric mood. The patient is not nervous/anxious.     Objective:  BP 128/76    Pulse 72    Temp (!) 96.8 F (36 C)    Resp 18    Ht 5' 0.5" (1.537 m)    Wt 153 lb (69.4 kg)    LMP 08/22/2005    BMI 29.39 kg/m   BP/Weight 09/23/2021 09/02/2021 63/89/3734  Systolic BP 287 681 157  Diastolic BP 76 74 72  Wt. (Lbs) 153 156.8 155  BMI 29.39 30.12 29.29    Physical Exam Vitals reviewed.  Constitutional:      Appearance: Normal appearance. She is normal weight.  Cardiovascular:     Rate and Rhythm: Normal rate and regular rhythm.     Heart sounds: Normal heart sounds.  Pulmonary:     Effort: Pulmonary effort is normal. No respiratory distress.     Breath sounds: Normal breath sounds.  Neurological:     Mental Status: She is alert and oriented to person, place, and time.  Psychiatric:        Mood and Affect: Mood normal.        Behavior: Behavior normal.    Diabetic Foot Exam - Simple   No data filed      Lab Results  Component Value Date   WBC 5.3 07/12/2021   HGB 14.0 07/12/2021   HCT 41.6 07/12/2021   PLT 283 07/12/2021   GLUCOSE 89 07/12/2021   CHOL 114 07/12/2021   TRIG 93 07/12/2021   HDL 52 07/12/2021   LDLCALC 44 07/12/2021   ALT 19 07/12/2021   AST 28 07/12/2021   NA 140 07/12/2021   K 4.1 07/12/2021   CL 104 07/12/2021   CREATININE 0.76 07/12/2021   BUN 13 07/12/2021   CO2 23 07/12/2021   TSH 3.740 07/12/2021   HGBA1C 5.5 07/12/2021   MICROALBUR 30 07/12/2021      Assessment & Plan:   Problem  List Items Addressed This Visit       Other   Mild recurrent major depression ([place]) - Primary    Fairly well controlled.  Increase duloxetine 60 mg twice daily       Relevant Medications   DULoxetine (CYMBALTA) 60 MG capsule   Chronic back pain    Increase duloxetine 60 mg twice daily       Relevant Medications   DULoxetine (CYMBALTA) 60 MG capsule  .  Patient to have her husband return for lab work.   Follow-up: Return in about 6 months (around 03/23/2022) for chronic fasting.  An After Visit Summary was printed and given to the patient.  Rochel Brome, MD [facility] [phone]

## 2021-09-23 NOTE — Assessment & Plan Note (Signed)
Increase duloxetine 60 mg twice daily

## 2021-09-23 NOTE — Patient Instructions (Signed)
Increase duloxetine 60 mg twice daily

## 2021-09-23 NOTE — Assessment & Plan Note (Signed)
Fairly well controlled.  Increase duloxetine 60 mg twice daily

## 2021-09-27 ENCOUNTER — Other Ambulatory Visit: Payer: Self-pay | Admitting: Allergy and Immunology

## 2021-09-29 DIAGNOSIS — M4316 Spondylolisthesis, lumbar region: Secondary | ICD-10-CM | POA: Diagnosis not present

## 2021-10-04 ENCOUNTER — Ambulatory Visit: Payer: Medicare Other | Admitting: Allergy and Immunology

## 2021-10-06 ENCOUNTER — Ambulatory Visit: Payer: Medicare Other | Admitting: Allergy and Immunology

## 2021-10-07 ENCOUNTER — Other Ambulatory Visit: Payer: Self-pay

## 2021-10-07 ENCOUNTER — Ambulatory Visit (INDEPENDENT_AMBULATORY_CARE_PROVIDER_SITE_OTHER): Payer: Medicare Other

## 2021-10-07 DIAGNOSIS — F330 Major depressive disorder, recurrent, mild: Secondary | ICD-10-CM

## 2021-10-07 DIAGNOSIS — E038 Other specified hypothyroidism: Secondary | ICD-10-CM

## 2021-10-07 DIAGNOSIS — E782 Mixed hyperlipidemia: Secondary | ICD-10-CM

## 2021-10-07 NOTE — Patient Instructions (Signed)
Visit Information   Goals Addressed   None    Patient Care Plan: CCM Pharmacy Care Plan     Problem Identified: Mental Health, Thyroid, Cholesterol   Priority: High  Onset Date: 10/07/2021     Long-Range Goal: Disease State Management   Start Date: 10/07/2021  Expected End Date: 10/07/2022  This Visit's Progress: On track  Priority: High  Note:   Current Barriers:  Does not contact provider office for questions/concerns  Pharmacist Clinical Goal(s):  Patient will contact provider office for questions/concerns as evidenced notation of same in electronic health record through collaboration with PharmD and provider.   Interventions: 1:1 collaboration with Rochel Brome, MD regarding development and update of comprehensive plan of care as evidenced by provider attestation and co-signature Inter-disciplinary care team collaboration (see longitudinal plan of care) Comprehensive medication review performed; medication list updated in electronic medical record  Hyperlipidemia: (LDL goal < 70) The ASCVD Risk score (Arnett DK, et al., 2019) failed to calculate for the following reasons:   The valid total cholesterol range is 130 to 320 mg/dL Lab Results  Component Value Date   CHOL 114 07/12/2021   CHOL 148 04/01/2021   CHOL 228 (H) 12/14/2020   Lab Results  Component Value Date   HDL 52 07/12/2021   HDL 50 04/01/2021   HDL 52 12/14/2020   Lab Results  Component Value Date   LDLCALC 44 07/12/2021   LDLCALC 74 04/01/2021   LDLCALC 138 (H) 12/14/2020   Lab Results  Component Value Date   TRIG 93 07/12/2021   TRIG 139 04/01/2021   TRIG 210 (H) 12/14/2020   Lab Results  Component Value Date   CHOLHDL 2.2 07/12/2021   CHOLHDL 3.0 04/01/2021   CHOLHDL 4.4 12/14/2020  No results found for: LDLDIRECT -Controlled -Current treatment: Rosuvastatin 10mg  QD Appropriate, Effective, Safe, Accessible -Medications previously tried: N/A  -Current dietary patterns: "Tries to eat  healthy" -Current exercise habits: Patient volunteers and working -Educated on Cholesterol goals;  -Recommended to continue current medication  Depression/Anxiety  -Controlled -Current treatment: Duloxetine 60mg  BID Appropriate, Effective, Safe, Accessible -Medications previously tried/failed: N/A -PHQ9:  Depression screen [facility] 2/9 09/23/2021 07/12/2021 06/21/2021  Decreased Interest 1 0 0  Down, Depressed, Hopeless 1 0 0  PHQ - 2 Score 2 0 0  Altered sleeping 2 - -  Tired, decreased energy 1 - -  Change in appetite 0 - -  Feeling bad or failure about yourself  0 - -  Trouble concentrating 0 - -  Moving slowly or fidgety/restless 0 - -  Suicidal thoughts 0 - -  PHQ-9 Score 5 - -  Difficult doing work/chores Somewhat difficult - -  -GAD7: No flowsheet data found. -Educated on Benefits of medication for symptom control -Recommended to continue current medication  Thyroid (Goal TSH: 0.4-5.0) Lab Results  Component Value Date   TSH 3.740 07/12/2021  -Controlled -Current treatment: Levothyroxine 34mcg Appropriate, Effective, Safe, Accessible -Counseled to take medication on an empty stomach -Recommended to continue current medication   Patient Goals/Self-Care Activities Patient will:  - take medications as prescribed as evidenced by patient report and record review  Follow Up Plan: The patient has been provided with contact information for the care management team and has been advised to call with any health related questions or concerns.   [place] Constable, Pharm.D. - [phone]  CPP F/U August 2023      Ms. Quale was given information about Chronic Care Management services today including:  CCM service  includes personalized support from designated clinical staff supervised by her physician, including individualized plan of care and coordination with other care providers 24/7 contact phone numbers for assistance for urgent and routine care needs. Standard insurance,  coinsurance, copays and deductibles apply for chronic care management only during months in which we provide at least 20 minutes of these services. Most insurances cover these services at 100%, however patients may be responsible for any copay, coinsurance and/or deductible if applicable. This service may help you avoid the need for more expensive face-to-face services. Only one practitioner may furnish and bill the service in a calendar month. The patient may stop CCM services at any time (effective at the end of the month) by phone call to the office staff.  Patient agreed to services and verbal consent obtained.   The patient verbalized understanding of instructions, educational materials, and care plan provided today and declined offer to receive copy of patient instructions, educational materials, and care plan.  The pharmacy team will reach out to the patient again over the next 60 days.   Lane Hacker, [place]

## 2021-10-07 NOTE — Progress Notes (Signed)
Chronic Care Management Pharmacy Note  10/07/2021 Name:  Olivia Cruz MRN:  [id number] DOB:  1951-07-30  Summary: -Pleasant 71 year old female presents for f/u CCM visit. She retired at the age of 67 and "Didn't like it unless we were traveling" so she went back to work at 61 at [employer] for 4 hours/day in the [place]. 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 She doesn't like TV and likes moving/keeping busy. She has a daughter in [place] and they are visiting her tomorrow. They have 2 dogs, (One is sick). Her and her husband love to fish and have a camper (They did a cross country trip to [place], [place], [place], and all over in 2022)  Recommendations/Changes made from today's visit: -None, patient wasn't very distracted during conversation, they are packing to get ready for trip to [place] tomorrow  Subjective: Olivia Cruz is an 71 y.o. year old female who is a primary patient of Cox, Kirsten, MD.  The CCM team was consulted for assistance with disease management and care coordination needs.    Engaged with patient by telephone for follow up visit in response to provider referral for pharmacy case management and/or care coordination services.   Consent to Services:  The patient was given the following information about Chronic Care Management services today, agreed to services, and gave verbal consent: 1. CCM service includes personalized support from designated clinical staff supervised by the primary care provider, including individualized plan of care and coordination with other care providers 2. 24/7 contact phone numbers for assistance for urgent and routine care needs. 3. Service will only be billed when office clinical staff spend 20 minutes or more in a month to coordinate care. 4. Only one practitioner may furnish and bill the service in a calendar month. 5.The patient may stop CCM  services at any time (effective at the end of the month) by phone call to the office staff. 6. The patient will be responsible for cost sharing (co-pay) of up to 20% of the service fee (after annual deductible is met). Patient agreed to services and consent obtained.  Patient Care Team: Rochel Brome, MD as PCP - General (Family Medicine) Jolene Schimke, MD as Referring Physician (Dermatology) Mercie Eon, MD as Referring Physician (Rheumatology) Rosanne Ashing, MD as Referring Physician (Orthopedic Surgery) Sharyn Dross., DPM (Podiatry) Lane Hacker, [facility] (Pharmacist)  Recent office visits:  08/30/21 Rochel Brome MD. Orders Only. D/C Citalopram 20 mg. Started on Duloxetine 60 mg daily at bedtime.    Recent consult visits:  [facility] Allena Katz MD. Seen for Asthma. Started on Famotidine 40 mg daily at bedtime. Started on Pulmicort Inhaler 180 mcg/act once daily. D/C the following meds: Alendronate Sodium 70 mg, Citalopram Hydrobromide 20 mg daily, Diazepam 5 mg, Meloxicam 15 mg daily, Phenazopyridine HCI 200 mg 3 times daily prn.   08/25/21 (Neurology) Maurie Boettcher MD. Seen for Migraine.D/C Tylenol 500 mg, Amoxicillin 683 mg, Folic Acid 1 mg, Meloxicam 15 mg and Macrobid 100 mg.    07/30/21 No notes available    07/27/21 No notes available    07/26/21 No notes available    Hospital visits:  None    Objective:  Lab Results  Component Value Date   CREATININE 0.76 07/12/2021   BUN 13 07/12/2021   EGFR 84 07/12/2021   GFRNONAA 88 09/15/2020   GFRAA 102 09/15/2020  NA 140 07/12/2021   K 4.1 07/12/2021   CALCIUM 9.8 07/12/2021   CO2 23 07/12/2021   GLUCOSE 89 07/12/2021    Lab Results  Component Value Date/Time   HGBA1C 5.5 07/12/2021 08:03 AM   HGBA1C 6.2 (H) 04/01/2021 09:58 AM   MICROALBUR 30 07/12/2021 08:11 AM    Last diabetic Eye exam: No results found for: HMDIABEYEEXA  Last diabetic Foot exam: No results found for:  HMDIABFOOTEX   Lab Results  Component Value Date   CHOL 114 07/12/2021   HDL 52 07/12/2021   LDLCALC 44 07/12/2021   TRIG 93 07/12/2021   CHOLHDL 2.2 07/12/2021    Hepatic Function Latest Ref Rng & Units 07/12/2021 04/01/2021 12/14/2020  Total Protein 6.0 - 8.5 g/dL 6.4 6.2 6.9  Albumin 3.7 - 4.7 g/dL 4.3 4.1 4.5  AST 0 - 40 IU/L '28 26 24  ' ALT 0 - 32 IU/L '19 25 19  ' Alk Phosphatase 44 - 121 IU/L 80 99 102  Total Bilirubin 0.0 - 1.2 mg/dL 0.3 0.3 0.2  Bilirubin, Direct 0.00 - 0.40 mg/dL - - -    Lab Results  Component Value Date/Time   TSH 3.740 07/12/2021 08:03 AM   TSH 3.990 09/15/2020 11:01 AM    CBC Latest Ref Rng & Units 07/12/2021 04/01/2021 12/14/2020  WBC 3.4 - 10.8 x10E3/uL 5.3 6.9 6.3  Hemoglobin 11.1 - 15.9 g/dL 14.0 14.1 14.2  Hematocrit 34.0 - 46.6 % 41.6 43.4 42.6  Platelets 150 - 450 x10E3/uL 283 277 302    Lab Results  Component Value Date/Time   VD25OH 60 02/18/2013 10:29 AM    Clinical ASCVD: Yes  The ASCVD Risk score (Arnett DK, et al., 2019) failed to calculate for the following reasons:   The valid total cholesterol range is 130 to 320 mg/dL    Depression screen [facility] 2/9 09/23/2021 07/12/2021 06/21/2021  Decreased Interest 1 0 0  Down, Depressed, Hopeless 1 0 0  PHQ - 2 Score 2 0 0  Altered sleeping 2 - -  Tired, decreased energy 1 - -  Change in appetite 0 - -  Feeling bad or failure about yourself  0 - -  Trouble concentrating 0 - -  Moving slowly or fidgety/restless 0 - -  Suicidal thoughts 0 - -  PHQ-9 Score 5 - -  Difficult doing work/chores Somewhat difficult - -     Other: (CHADS2VASc if Afib, MMRC or CAT for COPD, ACT, DEXA)  Social History   Tobacco Use  Smoking Status Former   Types: Cigarettes   Quit date: 1986   Years since quitting: 37.1  Smokeless Tobacco Never   BP Readings from Last 3 Encounters:  09/23/21 128/76  09/02/21 134/74  07/12/21 114/72   Pulse Readings from Last 3 Encounters:  09/23/21 72  09/02/21 72   07/12/21 72   Wt Readings from Last 3 Encounters:  09/23/21 153 lb (69.4 kg)  09/02/21 156 lb 12.8 oz (71.1 kg)  07/12/21 155 lb (70.3 kg)   BMI Readings from Last 3 Encounters:  09/23/21 29.39 kg/m  09/02/21 30.12 kg/m  07/12/21 29.29 kg/m    Assessment/Interventions: Review of patient past medical history, allergies, medications, health status, including review of consultants reports, laboratory and other test data, was performed as part of comprehensive evaluation and provision of chronic care management services.   SDOH:  (Social Determinants of Health) assessments and interventions performed: Yes SDOH Interventions    Flowsheet Row Most Recent Value  SDOH Interventions  Financial Strain Interventions Intervention Not Indicated  Transportation Interventions Intervention Not Indicated      SDOH Screenings   Alcohol Screen: Low Risk    Last Alcohol Screening Score (AUDIT): 0  Depression (PHQ2-9): Medium Risk   PHQ-2 Score: 5  Financial Resource Strain: Low Risk    Difficulty of Paying Living Expenses: Not very hard  Food Insecurity: No Food Insecurity   Worried About Charity fundraiser in the Last Year: Never true   Ran Out of Food in the Last Year: Never true  Housing: Low Risk    Last Housing Risk Score: 0  Physical Activity: Sufficiently Active   Days of Exercise per Week: 5 days   Minutes of Exercise per Session: 30 min  Social Connections: Engineer, building services of Communication with Friends and Family: More than three times a week   Frequency of Social Gatherings with Friends and Family: More than three times a week   Attends Religious Services: More than 4 times per year   Active Member of [employer] or Organizations: Yes   Attends Music therapist: More than 4 times per year   Marital Status: Married  Stress: No Stress Concern Present   Feeling of Stress : Only a little  Tobacco Use: Medium Risk   Smoking Tobacco Use: Former    Smokeless Tobacco Use: Never   Passive Exposure: Not on file  Transportation Needs: No Transportation Needs   Lack of Transportation (Medical): No   Lack of Transportation (Non-Medical): No    CCM Care Plan  Allergies  Allergen Reactions   Codeine Anaphylaxis and Nausea Only   Ambien [Zolpidem Tartrate] Other (See Comments)    Memory loss per patient   Cyproheptadine Hcl Other (See Comments)    Confusion   Dilaudid [Hydromorphone]     hallucinations   Hydrocodone Other (See Comments)    anaphylaxis   Lisinopril    Pramipexole Nausea Only   Sulfa Antibiotics Other (See Comments)    hives   Tramadol    Other Rash    Paper tape caused rash    Medications Reviewed Today     Reviewed by Lane Hacker, [facility] (Pharmacist) on 10/07/21 at 1357  Med List Status: <None>   Medication Order Taking? Sig Documenting Provider Last Dose Status Informant  Adalimumab 40 MG/0.4ML PNKT [id number] No Inject into the skin. [provider] Taking Active   albuterol (VENTOLIN HFA) 108 (90 Base) MCG/ACT inhaler [id number] No Inhale 2 puffs into the lungs every 6 (six) hours as needed for wheezing or shortness of breath. Cox, Kirsten, MD Taking Active   budesonide (RHINOCORT AQUA) 32 MCG/ACT nasal spray [id number] No Place 1 spray into both nostrils daily. Reported on 02/04/2016 Rochel Brome, MD Taking Active            Med Note Birdie Sons Sep 02, 2021  2:27 PM) Taking as needed.  Calcium Carbonate-Vitamin D (CALCIUM + D PO) [id number] No Take 1,200 mg by mouth daily. [provider] Taking Active   cholecalciferol (VITAMIN D3) 25 MCG (1000 UNIT) tablet [id number] No Take 1,000 Units by mouth in the morning, at noon, and at bedtime. [provider] Taking Active Self  diclofenac Sodium (VOLTAREN) 1 % GEL [id number] No  [provider] Taking Active   DULoxetine (CYMBALTA) 60 MG capsule [id number]  Take 1 capsule (60 mg total) by mouth 2 (two) times daily.  Rochel Brome, MD  Active   famotidine (  PEPCID) 40 MG tablet [id number]  TAKE 1 TABLET BY MOUTH EVERYDAY AT BEDTIME Kozlow, Donnamarie Poag, MD  Active   FLOVENT [facility] 110 MCG/ACT inhaler [id number]  Inhale two puffs twice daily to prevent cough or wheeze.  Rinse, gargle, and spit after use. Kozlow, Donnamarie Poag, MD  Active   gabapentin (NEURONTIN) 300 MG capsule [id number] No Take 300 mg by mouth in the morning, at noon, and at bedtime. [provider] Taking Active   ibuprofen (ADVIL) 200 MG tablet [id number] No Take 400 mg by mouth as needed. [provider] Taking Active   levothyroxine (SYNTHROID) 50 MCG tablet [id number] No Take 1 tablet (50 mcg total) by mouth daily. Rochel Brome, MD Taking Active   Magnesium 250 MG TABS [id number] No Take 250 mg by mouth daily. [provider] Taking Active   Melatonin 5 MG LOZG [id number] No Take by mouth. [provider] Taking Active   Multiple Vitamin (MULTIVITAMIN) capsule [id number] No Take 1 capsule by mouth daily. [provider] Taking Active   pantoprazole (PROTONIX) 40 MG tablet [id number] No Take 1 tablet (40 mg total) by mouth daily. Cox, Kirsten, MD Taking Active   rosuvastatin (CRESTOR) 10 MG tablet [id number] No TAKE 1 TABLET BY MOUTH EVERY DAY Marge Duncans, PA-C Taking Active   valACYclovir (VALTREX) 500 MG tablet [id number] No Take 1 tablet (500 mg total) by mouth 3 (three) times daily as needed (herpes outbreak). Rochel Brome, MD Taking Active             Patient Active Problem List   Diagnosis Date Noted   Chronic back pain 09/23/2021   Mild recurrent major depression ([place]) 07/12/2021   Dysuria 07/12/2021   Acute infection of nasal sinus 06/21/2021   Seasonal allergic rhinitis due to pollen 06/21/2021   Chronic right shoulder pain 05/25/2021   Tendinopathy of right rotator cuff 05/25/2021   Migraine without aura and without status migrainosus, not intractable 05/07/2021   Muscle wasting and atrophy, not elsewhere  classified, left hand 12/01/2020   Achilles tendonosis 10/06/2020   Secondary hypothyroidism 12/22/2019   Prediabetes 12/22/2019   Mixed hyperlipidemia 12/22/2019   RLS (restless legs syndrome) 12/22/2019   Psoriatic arthritis ([place]) 10/28/2019   Acute cystitis with hematuria 09/29/2019   Laryngopharyngeal reflux (LPR) 09/12/2018   Vocal fold atrophy 09/12/2018   Cervical os stenosis 04/09/2015   Postmenopausal atrophic vaginitis 04/09/2015    Immunization History  Administered Date(s) Administered   DTaP 12/03/2012   Fluad Quad(high Dose 65+) 06/11/2020, 06/09/2021   PFIZER Comirnaty(Gray Top)Covid-19 Tri-Sucrose Vaccine 12/18/2020   PFIZER(Purple Top)SARS-COV-2 Vaccination 12/12/2019, 01/10/2020, 07/02/2020   Pfizer Covid-19 Vaccine Bivalent Booster 33yr & up 08/18/2021   Pneumococcal Conjugate-13 07/31/2015   Pneumococcal Polysaccharide-23 08/29/2016   Tdap 12/10/2012   Zoster Recombinat (Shingrix) 05/18/2018   Zoster, Live 02/19/2011    Conditions to be addressed/monitored:  Hyperlipidemia, Hypothyroidism, and Depression  Care Plan : [place] Updates made by KLane Hacker RLa Sallesince 10/07/2021 12:00 AM     Problem: Mental Health, Thyroid, Cholesterol   Priority: High  Onset Date: 10/07/2021     Long-Range Goal: Disease State Management   Start Date: 10/07/2021  Expected End Date: 10/07/2022  This Visit's Progress: On track  Priority: High  Note:   Current Barriers:  Does not contact provider office for questions/concerns  Pharmacist Clinical Goal(s):  Patient will contact provider office for questions/concerns as evidenced notation of same in electronic health record through collaboration with PharmD and  provider.   Interventions: 1:1 collaboration with Rochel Brome, MD regarding development and update of comprehensive plan of care as evidenced by provider attestation and co-signature Inter-disciplinary care team collaboration (see longitudinal  plan of care) Comprehensive medication review performed; medication list updated in electronic medical record  Hyperlipidemia: (LDL goal < 70) The ASCVD Risk score (Arnett DK, et al., 2019) failed to calculate for the following reasons:   The valid total cholesterol range is 130 to 320 mg/dL Lab Results  Component Value Date   CHOL 114 07/12/2021   CHOL 148 04/01/2021   CHOL 228 (H) 12/14/2020   Lab Results  Component Value Date   HDL 52 07/12/2021   HDL 50 04/01/2021   HDL 52 12/14/2020   Lab Results  Component Value Date   LDLCALC 44 07/12/2021   LDLCALC 74 04/01/2021   LDLCALC 138 (H) 12/14/2020   Lab Results  Component Value Date   TRIG 93 07/12/2021   TRIG 139 04/01/2021   TRIG 210 (H) 12/14/2020   Lab Results  Component Value Date   CHOLHDL 2.2 07/12/2021   CHOLHDL 3.0 04/01/2021   CHOLHDL 4.4 12/14/2020  No results found for: LDLDIRECT -Controlled -Current treatment: Rosuvastatin 12m QD Appropriate, Effective, Safe, Accessible -Medications previously tried: N/A  -Current dietary patterns: "Tries to eat healthy" -Current exercise habits: Patient volunteers and working -Educated on Cholesterol goals;  -Recommended to continue current medication  Depression/Anxiety  -Controlled -Current treatment: Duloxetine 667mBID Appropriate, Effective, Safe, Accessible -Medications previously tried/failed: N/A -PHQ9:  [facility] 09/23/2021 07/12/2021 06/21/2021  Decreased Interest 1 0 0  Down, Depressed, Hopeless 1 0 0  PHQ - 2 Score 2 0 0  Altered sleeping 2 - -  Tired, decreased energy 1 - -  Change in appetite 0 - -  Feeling bad or failure about yourself  0 - -  Trouble concentrating 0 - -  Moving slowly or fidgety/restless 0 - -  Suicidal thoughts 0 - -  PHQ-9 Score 5 - -  Difficult doing work/chores Somewhat difficult - -  -GAD7: No flowsheet data found. -Educated on Benefits of medication for symptom control -Recommended to continue  current medication  Thyroid (Goal TSH: 0.4-5.0) Lab Results  Component Value Date   TSH 3.740 07/12/2021  -Controlled -Current treatment: Levothyroxine 503mAppropriate, Effective, Safe, Accessible -Counseled to take medication on an empty stomach -Recommended to continue current medication   Patient Goals/Self-Care Activities Patient will:  - take medications as prescribed as evidenced by patient report and record review  Follow Up Plan: The patient has been provided with contact information for the care management team and has been advised to call with any health related questions or concerns.   NatArizona Constableharm.D. - [phone]  CPP F/U August 2023       Medication Assistance: None required.  Patient affirms current coverage meets needs.  Compliance/Adherence/Medication fill history: Care Gaps: Last annual wellness visit? None noted  Mammogram: 04/28/21 Dexa Scan: 12/15/20 Colonoscopy: 12/07/17 Last eye exam / retinopathy screening? None noted  Diabetic foot exam?None noted   Patient's preferred pharmacy is:  [id number], [place] [place]: [phone]: [phone] [id number] [place] [id number]: [phone]: [phone] pill box? No -   Pt endorses 100% compliance  We discussed: Current pharmacy is preferred with insurance plan and  patient is satisfied with pharmacy services Patient decided to: Continue current medication management strategy  Care Plan and Follow Up Patient Decision:  Patient agrees to Care Plan and Follow-up.  Plan: The patient has been provided with contact information for the care management team and has been advised to call with any health related questions or concerns.   F/U August 2023  [place] Constable, Sherian Rein.D. - [phone]

## 2021-10-15 ENCOUNTER — Other Ambulatory Visit: Payer: Self-pay | Admitting: Family Medicine

## 2021-10-15 ENCOUNTER — Other Ambulatory Visit: Payer: Self-pay | Admitting: Nurse Practitioner

## 2021-10-15 DIAGNOSIS — E038 Other specified hypothyroidism: Secondary | ICD-10-CM

## 2021-10-18 DIAGNOSIS — M5416 Radiculopathy, lumbar region: Secondary | ICD-10-CM | POA: Diagnosis not present

## 2021-10-18 DIAGNOSIS — M5116 Intervertebral disc disorders with radiculopathy, lumbar region: Secondary | ICD-10-CM | POA: Diagnosis not present

## 2021-10-18 NOTE — Telephone Encounter (Signed)
Refill sent to pharmacy.   

## 2021-10-19 DIAGNOSIS — E038 Other specified hypothyroidism: Secondary | ICD-10-CM | POA: Diagnosis not present

## 2021-10-19 DIAGNOSIS — E785 Hyperlipidemia, unspecified: Secondary | ICD-10-CM

## 2021-10-19 DIAGNOSIS — F32A Depression, unspecified: Secondary | ICD-10-CM | POA: Diagnosis not present

## 2021-10-21 ENCOUNTER — Ambulatory Visit: Payer: Medicare Other | Admitting: Allergy and Immunology

## 2021-10-21 ENCOUNTER — Other Ambulatory Visit: Payer: Self-pay

## 2021-10-21 ENCOUNTER — Encounter: Payer: Self-pay | Admitting: Allergy and Immunology

## 2021-10-21 VITALS — BP 144/80 | HR 68 | Resp 14

## 2021-10-21 DIAGNOSIS — K219 Gastro-esophageal reflux disease without esophagitis: Secondary | ICD-10-CM

## 2021-10-21 DIAGNOSIS — J4530 Mild persistent asthma, uncomplicated: Secondary | ICD-10-CM | POA: Diagnosis not present

## 2021-10-21 DIAGNOSIS — J3089 Other allergic rhinitis: Secondary | ICD-10-CM

## 2021-10-21 NOTE — Progress Notes (Signed)
? ?[place] ? ? ?Follow-up Note ? ?Referring Provider: Rochel Brome, MD ?Primary Provider: Rochel Brome, MD ?Date of Office Visit: 10/21/2021 ? ?Subjective:  ? ?Olivia Cruz (DOB: [birth year]) is a 71 y.o. female who returns to the [facility] [place] on 10/21/2021 in re-evaluation of the following: ? ?HPI: Adamaris returns to this clinic in reevaluation of asthma and allergic rhinitis and LPR.  I last saw her in this clinic on 02 September 2021 at which point in time she was having lots of coughing and throat issues. ? ?With her plan directed against inflammation of her airway and therapy against LPR she has basically resolved her cough.  She does not use a short acting bronchodilator.  She has no issues with her nose.  Her reflux is under very good control.   ? ?She is having an issue with a right lower extremity radiculopathy for which she was injected with a systemic steroid in her lumbar space this week with a good result. ? ?Allergies as of 10/21/2021   ? ?   Reactions  ? Codeine Anaphylaxis, Nausea Only  ? Ambien [zolpidem Tartrate] Other (See Comments)  ? Memory loss per patient  ? Cyproheptadine Hcl Other (See Comments)  ? Confusion  ? Dilaudid [hydromorphone]   ? hallucinations  ? Hydrocodone Other (See Comments)  ? anaphylaxis  ? Lisinopril   ? Pramipexole Nausea Only  ? Sulfa Antibiotics Other (See Comments)  ? hives  ? Tramadol   ? ?  ? ?  ?Medication List  ? ? ?Adalimumab 40 MG/0.4ML Pnkt ?Inject into the skin. ?  ?albuterol 108 (90 Base) MCG/ACT inhaler ?Commonly known as: VENTOLIN HFA ?Inhale 2 puffs into the lungs every 6 (six) hours as needed for wheezing or shortness of breath. ?  ?budesonide 32 MCG/ACT nasal spray ?Commonly known as: RHINOCORT AQUA ?Place 1 spray into both nostrils daily. Reported on 02/04/2016 ?  ?CALCIUM + D PO ?Take 1,200 mg by mouth daily. ?  ?cholecalciferol 25 MCG (1000 UNIT) tablet ?Commonly known as: VITAMIN D3 ?Take  1,000 Units by mouth in the morning, at noon, and at bedtime. ?  ?diclofenac Sodium 1 % Gel ?Commonly known as: VOLTAREN ?  ?DULoxetine 60 MG capsule ?Commonly known as: Cymbalta ?Take 1 capsule (60 mg total) by mouth 2 (two) times daily. ?  ?famotidine 40 MG tablet ?Commonly known as: PEPCID ?TAKE 1 TABLET BY MOUTH EVERYDAY AT BEDTIME ?  ?Flovent HFA 110 MCG/ACT inhaler ?Generic drug: fluticasone ?Inhale two puffs twice daily to prevent cough or wheeze.  Rinse, gargle, and spit after use. ?  ?FOLIC ACID PO ?Take by mouth daily. ?  ?gabapentin 300 MG capsule ?Commonly known as: NEURONTIN ?Take 300 mg by mouth in the morning, at noon, and at bedtime. ?  ?HAIR/SKIN/NAILS PO ?Take by mouth daily. ?  ?ibuprofen 200 MG tablet ?Commonly known as: ADVIL ?Take 400 mg by mouth as needed. ?  ?levothyroxine 50 MCG tablet ?Commonly known as: SYNTHROID ?TAKE 1 TABLET BY MOUTH DAILY ?  ?Magnesium 250 MG Tabs ?Take 250 mg by mouth daily. ?  ?multivitamin capsule ?Take 1 capsule by mouth daily. ?  ?pantoprazole 40 MG tablet ?Commonly known as: PROTONIX ?TAKE 1 TABLET BY MOUTH DAILY ?  ?rosuvastatin 10 MG tablet ?Commonly known as: CRESTOR ?TAKE 1 TABLET BY MOUTH DAILY ?  ?valACYclovir 500 MG tablet ?Commonly known as: VALTREX ?Take 1 tablet (500 mg total) by mouth 3 (three) times  daily as needed (herpes outbreak). ?  ? ?Past Medical History:  ?Diagnosis Date  ? Allergic rhinoconjunctivitis   ? Anxiety   ? Asthma   ? Depression   ? Diverticulitis   ? GERD (gastroesophageal reflux disease)   ? Glaucoma   ? Hypothyroid   ? Insomnia   ? Laryngopharyngeal reflux (LPR)   ? Migraines   ? Osteoarthritis   ? Psoriatic arthritis ([place])   ? Skin cancer   ? STD (sexually transmitted disease)   ? HSV II  ? ? ?Past Surgical History:  ?Procedure Laterality Date  ? [place]    ? BREAST SURGERY Left   ? times 2  ? COLONOSCOPY  06/03/2013  ? Moderate predominantly sigmoid diverticulosis. Small interal hemorroids  ? FOOT SURGERY Right   ?  Removed bone spur  ? GANGLION CYST EXCISION Left   ? foot  ? HEMORRHOID SURGERY    ? SKIN CANCER EXCISION    ? TOTAL KNEE ARTHROPLASTY Right 07/21/2020  ? UPPER GI ENDOSCOPY  03/23/2016  ? Mild gastritis, gastric polyps bxbenign squamous mucosa with no abnormaility, fundic glad poly in setting of mild chron gastritis.  ? ? ?Review of systems negative except as noted in HPI / PMHx or noted below: ? ?Review of Systems  ?Constitutional: Negative.   ?HENT: Negative.    ?Eyes: Negative.   ?Respiratory: Negative.    ?Cardiovascular: Negative.   ?Gastrointestinal: Negative.   ?Genitourinary: Negative.   ?Musculoskeletal: Negative.   ?Skin: Negative.   ?Neurological: Negative.   ?Endo/Heme/Allergies: Negative.   ?Psychiatric/Behavioral: Negative.    ? ? ?Objective:  ? ?Vitals:  ? 10/21/21 1349 10/21/21 1420  ?BP: (!) 146/84 (!) 144/80  ?Pulse: 68   ?Resp: 14   ?SpO2: 96%   ? ?   ?   ? ?Physical Exam ?Constitutional:   ?   Appearance: She is not diaphoretic.  ?HENT:  ?   Head: Normocephalic.  ?   Right Ear: Tympanic membrane, ear canal and external ear normal.  ?   Left Ear: Tympanic membrane, ear canal and external ear normal.  ?   Nose: Nose normal. No mucosal edema or rhinorrhea.  ?   Mouth/Throat:  ?   Pharynx: Uvula midline. No oropharyngeal exudate.  ?Eyes:  ?   Conjunctiva/sclera: Conjunctivae normal.  ?Neck:  ?   Thyroid: No thyromegaly.  ?   Trachea: Trachea normal. No tracheal tenderness or tracheal deviation.  ?Cardiovascular:  ?   Rate and Rhythm: Normal rate and regular rhythm.  ?   Heart sounds: Normal heart sounds, S1 normal and S2 normal. No murmur heard. ?Pulmonary:  ?   Effort: No respiratory distress.  ?   Breath sounds: Normal breath sounds. No stridor. No wheezing or rales.  ?Lymphadenopathy:  ?   Head:  ?   Right side of head: No tonsillar adenopathy.  ?   Left side of head: No tonsillar adenopathy.  ?   Cervical: No cervical adenopathy.  ?Skin: ?   Findings: No erythema or rash.  ?   Nails: There is no  clubbing.  ?Neurological:  ?   Mental Status: She is alert.  ? ? ?Diagnostics:  ?  ?Spirometry was performed and demonstrated an FEV1 of 1.91 at 98 % of predicted. ? ?Results of blood tests obtained 12 July 2021 identifies creatinine 0.76 mg/DL, AST 28 U/L, ALT 19 U/L, WBC 5.3, absolute eosinophil 200, absolute lymphocyte 1500, hemoglobin 14.0, platelet 283. ? ?Results of blood tests obtained  02 September 2021 identifies IgE 11 KU/L, no antigen specific IgE antibodies on an area to aero allergen IgE profile, IgG 820 mg/DL, IgM 101 mg/DL, IgG 213 Mg/DL, adequate levels of antibodies directed against multiple serotypes of pneumococcus, and tetanus IgG antibody 0.96U/mL, diphtheria antibody 0.24U/mL, ? ?Assessment and Plan:  ? ?1. Asthma, well controlled, mild persistent   ?2. Perennial allergic rhinitis   ?3. LPRD (laryngopharyngeal reflux disease)   ? ?1.  Treat and prevent inflammation: ? ?A. DECREASE Flovent 110 - 2 inhalations 1 time per day ?B. Budesonide - 2 sprays each nostril 1-7 times per week ? ?2.  Treat and prevent reflux/LPR: ? ?A. Pantoprazole 40 mg - 1 tablet 1 time per day ?B. Famotidine 40 mg - 1 tablet in evening ? ?3.  If needed: ? ?A. Nasal saline ?B. OTC Cetirizine 10 mg - 1 tablet 1 time per day ?C. Albuterol HFA - 2 inhalations every 4-6 hours ? ?4.  Return in 12 weeks or earlier if problem. Taper medications??? ? ?Marvel appears to be doing very well on her respiratory tract issue on her current therapy which includes anti-inflammatory medications for airway and aggressive therapy direct against LPR.  We will keep her on this plan for an additional 12 weeks.  I suspect she will continue to do well and he will be an opportunity to further consolidate her medical therapy with that visit. ? ?Allena Katz, MD ?Allergy / Immunology ?[facility] ?

## 2021-10-21 NOTE — Patient Instructions (Addendum)
?  1.  Treat and prevent inflammation: ? ?A. DECREASE Flovent 110 - 2 inhalations 1 time per day ?B. Budesonide - 2 sprays each nostril 1-7 times per week ? ?2.  Treat and prevent reflux/LPR: ? ?A. Pantoprazole 40 mg - 1 tablet 1 time per day ?B. Famotidine 40 mg - 1 tablet in evening ? ?3.  If needed: ? ?A. Nasal saline ?B. OTC Cetirizine 10 mg - 1 tablet 1 time per day ?C. Albuterol HFA - 2 inhalations every 4-6 hours ? ?4.  Return in 12 weeks or earlier if problem. Taper medications??? ?

## 2021-10-25 ENCOUNTER — Encounter: Payer: Self-pay | Admitting: Allergy and Immunology

## 2021-10-26 ENCOUNTER — Encounter: Payer: Self-pay | Admitting: Nurse Practitioner

## 2021-10-26 ENCOUNTER — Ambulatory Visit (INDEPENDENT_AMBULATORY_CARE_PROVIDER_SITE_OTHER): Payer: Medicare Other | Admitting: Nurse Practitioner

## 2021-10-26 ENCOUNTER — Other Ambulatory Visit: Payer: Self-pay

## 2021-10-26 VITALS — BP 110/80 | HR 108 | Temp 97.2°F | Ht 61.5 in | Wt 148.0 lb

## 2021-10-26 DIAGNOSIS — R808 Other proteinuria: Secondary | ICD-10-CM

## 2021-10-26 DIAGNOSIS — R531 Weakness: Secondary | ICD-10-CM

## 2021-10-26 DIAGNOSIS — R000 Tachycardia, unspecified: Secondary | ICD-10-CM | POA: Diagnosis not present

## 2021-10-26 DIAGNOSIS — R739 Hyperglycemia, unspecified: Secondary | ICD-10-CM | POA: Diagnosis not present

## 2021-10-26 DIAGNOSIS — T380X5A Adverse effect of glucocorticoids and synthetic analogues, initial encounter: Secondary | ICD-10-CM | POA: Diagnosis not present

## 2021-10-26 DIAGNOSIS — R319 Hematuria, unspecified: Secondary | ICD-10-CM | POA: Diagnosis not present

## 2021-10-26 DIAGNOSIS — R110 Nausea: Secondary | ICD-10-CM | POA: Diagnosis not present

## 2021-10-26 LAB — COMPREHENSIVE METABOLIC PANEL
ALT: 52 IU/L — ABNORMAL HIGH (ref 0–32)
AST: 44 IU/L — ABNORMAL HIGH (ref 0–40)
Albumin/Globulin Ratio: 1.3 (ref 1.2–2.2)
Albumin: 4.1 g/dL (ref 3.7–4.7)
Alkaline Phosphatase: 85 IU/L (ref 44–121)
BUN/Creatinine Ratio: 29 — ABNORMAL HIGH (ref 12–28)
BUN: 27 mg/dL (ref 8–27)
Bilirubin Total: 0.5 mg/dL (ref 0.0–1.2)
CO2: 23 mmol/L (ref 20–29)
Calcium: 9.7 mg/dL (ref 8.7–10.3)
Chloride: 96 mmol/L (ref 96–106)
Creatinine, Ser: 0.93 mg/dL (ref 0.57–1.00)
Globulin, Total: 3.1 g/dL (ref 1.5–4.5)
Glucose: 246 mg/dL — ABNORMAL HIGH (ref 70–99)
Potassium: 4.9 mmol/L (ref 3.5–5.2)
Sodium: 134 mmol/L (ref 134–144)
Total Protein: 7.2 g/dL (ref 6.0–8.5)
eGFR: 66 mL/min/{1.73_m2} (ref >59)

## 2021-10-26 LAB — POC COVID19 BINAXNOW: SARS Coronavirus 2 Ag: NEGATIVE

## 2021-10-26 LAB — POCT URINALYSIS DIP (CLINITEK)
Glucose, UA: NEGATIVE mg/dL
Ketones, POC UA: NEGATIVE mg/dL
Leukocytes, UA: NEGATIVE
Nitrite, UA: NEGATIVE
POC PROTEIN,UA: >=300 — AB
Spec Grav, UA: >=1.03 — AB
Urobilinogen, UA: 0.2 U/dL
pH, UA: 5.5

## 2021-10-26 LAB — CBC WITH DIFFERENTIAL/PLATELET
Basophils Absolute: 0 10*3/uL (ref 0.0–0.2)
Basos: 0 %
EOS (ABSOLUTE): 0 10*3/uL (ref 0.0–0.4)
Eos: 0 %
Hematocrit: 49 % — ABNORMAL HIGH (ref 34.0–46.6)
Hemoglobin: 16.9 g/dL — ABNORMAL HIGH (ref 11.1–15.9)
Lymphocytes Absolute: 0.9 10*3/uL (ref 0.7–3.1)
Lymphs: 7 %
MCH: 30.2 pg (ref 26.6–33.0)
MCHC: 34.5 g/dL (ref 31.5–35.7)
MCV: 88 fL (ref 79–97)
Monocytes Absolute: 0.9 10*3/uL (ref 0.1–0.9)
Monocytes: 7 %
Neutrophils Absolute: 10.5 10*3/uL — ABNORMAL HIGH (ref 1.4–7.0)
Neutrophils: 86 %
Platelets: 251 10*3/uL (ref 150–450)
RBC: 5.59 x10E6/uL — ABNORMAL HIGH (ref 3.77–5.28)
RDW: 15.3 % (ref 11.7–15.4)
WBC: 12.2 10*3/uL — ABNORMAL HIGH (ref 3.4–10.8)

## 2021-10-26 LAB — GLUCOSE, POCT (MANUAL RESULT ENTRY): POC Glucose: 223 mg/dl — AB (ref 70–99)

## 2021-10-26 LAB — SEDIMENTATION RATE: Sed Rate: 49 mm/hr — ABNORMAL HIGH (ref 0–40)

## 2021-10-26 MED ORDER — JANUMET 50-500 MG PO TABS: 1.0000 | ORAL_TABLET | Freq: Two times a day (BID) | ORAL | 0 refills | Status: DC

## 2021-10-26 MED ORDER — CEFDINIR 300 MG PO CAPS: 300.0000 mg | ORAL_CAPSULE | Freq: Two times a day (BID) | ORAL | 0 refills | Status: DC

## 2021-10-26 MED ORDER — ONDANSETRON HCL 4 MG PO TABS: 4.0000 mg | ORAL_TABLET | Freq: Three times a day (TID) | ORAL | 0 refills | Status: DC | PRN

## 2021-10-26 NOTE — Patient Instructions (Addendum)
Begin Janumet for elevated blood sugar Take Zofran as needed for nausea Take Cefdinir twice daily for 5 days We will call you with lab results Push fluids, especially water Increase food intake   Hyperglycemia Hyperglycemia occurs when the level of sugar (glucose) in the blood is too high. Glucose is a type of sugar that provides the body's main source of energy. Certain hormones (insulin and glucagon) control the level of glucose in the blood. Insulin lowers blood glucose, and glucagon increases blood glucose. Hyperglycemia can result from not having enough insulin in the bloodstream, or from the body not responding normally to insulin. Hyperglycemia occurs most often in people who have diabetes (diabetes mellitus), but it can happen in people who do not have diabetes. It can develop quickly, and it can be life-threatening if it causes you to become severely dehydrated (diabetic ketoacidosis or hyperglycemic hyperosmolar state). Severe hyperglycemia is a medical emergency. For most people with diabetes, a blood glucose level above 240 mg/dL is considered hyperglycemia. What are the causes? If you have diabetes, hyperglycemia may be caused by: Medicines that increase blood glucose or affect your diabetes control. Getting less physical activity. Eating more than planned. Being sick or injured, having an infection, or having surgery. Stress. Not giving yourself enough insulin (if you are taking insulin). If you have undiagnosed diabetes, this may be the reason you have hyperglycemia. If you do not have diabetes, hyperglycemia may be caused by: Certain medicines, including: Steroid medicines. Beta-blockers. Epinephrine. Thiazide diuretics. Stress. Having a serious illness, an infection, or surgery. Diseases of the pancreas. What increases the risk? Hyperglycemia is more likely to develop in people who have risk factors for diabetes, such as: Having a family member with diabetes. Certain  conditions in which the body's disease-fighting system (immune system) attacks itself (autoimmune disorders). Being overweight or obese. Having an inactive (sedentary) lifestyle. Having been diagnosed with insulin resistance. Having a history of prediabetes, gestational diabetes, or polycystic ovarian syndrome (PCOS). What are the signs or symptoms? Hyperglycemia may not cause any symptoms. If you do have symptoms, they may include: Increased thirst. Needing to urinate more often than usual. Hunger. Feeling very tired. Blurry vision. Other symptoms may develop if hyperglycemia gets worse, such as: Dry mouth. Abdominal pain. Loss of appetite. Fruity-smelling breath. Weakness. Unexpected weight loss. Tingling or numbness in the hands or feet. Headache. Cuts or bruises that are slow to heal. How is this diagnosed? Hyperglycemia is diagnosed with a blood test to measure your blood glucose level. This blood test is usually done while you are having symptoms. Your health care provider may also do a physical exam and review your medical history. You may have more tests to determine the cause of your hyperglycemia, such as: A fasting blood glucose (FBG) test. You will not be allowed to eat (you will fast) for at least 8 hours before a blood sample is taken. An A1C blood test. This provides information about blood glucose control over the previous 2-3 months. An oral glucose tolerance test (OGTT). This measures your blood glucose at two times: After fasting. This is your baseline blood glucose level. 2 hours after drinking a beverage that contains glucose. How is this treated? Treatment depends on the cause of your hyperglycemia. Treatment may include: Taking medicine to regulate your blood glucose levels. If you take insulin or other diabetes medicines, your medicine or dosage may be adjusted. Lifestyle changes, such as exercising more, eating healthier foods, or losing weight. Treating an  illness or  infection. Checking your blood glucose more often. Stopping or reducing steroid medicines. If your hyperglycemia becomes severe and it results in diabetic ketoacidosis or hyperglycemic hyperosmolar state, you must be hospitalized and given IV fluids and IV insulin. Follow these instructions at home: General instructions Take over-the-counter and prescription medicines only as told by your health care provider. Do not use any products that contain nicotine or tobacco. These products include cigarettes, chewing tobacco, and vaping devices, such as e-cigarettes. If you need help quitting, ask your health care provider. If you drink alcohol: Limit how much you have to: 0-1 drink a day for women who are not pregnant. 0-2 drinks a day for men. Know how much alcohol is in a drink. In the [place] one drink equals one 12 oz bottle of beer (355 mL), one 5 oz glass of wine (148 mL), or one 1 oz glass of hard liquor (44 mL). Learn to manage stress. If you need help with this, ask your health care provider. Do exercises as told by your health care provider. Keep all follow-up visits. This is important. Eating and drinking  Maintain a healthy weight. Stay hydrated, especially when you exercise, get sick, or spend time in hot temperatures. Drink enough fluid to keep your urine pale yellow. If you have diabetes:  Know the symptoms of hyperglycemia. Follow your diabetes management plan as told by your health care provider. Make sure you: Take your insulin and medicines as told. Follow your exercise plan. Follow your meal plan. Eat on time, and do not skip meals. Check your blood glucose as often as told. Make sure to check your blood glucose before and after exercise. If you exercise longer or in a different way, check your blood glucose more often. Follow your sick day plan whenever you cannot eat or drink normally. Make this plan in advance with your health care provider. Share your  diabetes management plan with people in your workplace, school, and household. Check your urine for ketones when you are ill and as told by your health care provider. Carry a medical alert card or wear medical alert jewelry. Where to find more information American Diabetes Association: www.diabetes.org Contact a health care provider if: Your blood glucose is at or above 240 mg/dL (13.3 mmol/L) for 2 days in a row. You have problems keeping your blood glucose in your target range. You have frequent episodes of hyperglycemia. You have signs of illness, such as nausea, vomiting, or fever. Get help right away if: Your blood glucose monitor reads "high" even when you are taking insulin. You have trouble breathing. You have a change in how you think, feel, or act (mental status). You have nausea or vomiting that does not go away. These symptoms may represent a serious problem that is an emergency. Do not wait to see if the symptoms will go away. Get medical help right away. Call your local emergency services (911 in the [place] Do not drive yourself to the hospital. Summary Hyperglycemia occurs when the level of sugar (glucose) in the blood is too high. Hyperglycemia can happen with or without diabetes, and severe hyperglycemia can be life-threatening. Hyperglycemia is diagnosed with a blood test to measure your blood glucose level. This blood test is usually done while you are having symptoms. Your health care provider may also do a physical exam and review your medical history. If you have diabetes, follow your diabetes management plan as told by your health care provider. Contact your health care provider if  you have problems keeping your blood glucose in your target range. This information is not intended to replace advice given to you by your health care provider. Make sure you discuss any questions you have with your health care provider. Document Revised: 05/22/2020 Document Reviewed:  05/22/2020 Elsevier Patient Education  2022 [place].  Hematuria, Adult Hematuria is blood in the urine. Blood may be visible in the urine, or it may be identified with a test. This condition can be caused by infections of the bladder, urethra, kidney, or prostate. Other possible causes include: Kidney stones. Cancer of the urinary tract. Too much calcium in the urine. Conditions that are passed from parent to child (inherited conditions). Exercise that requires a lot of energy. Infections can usually be treated with medicine, and a kidney stone usually will pass through your urine. If neither of these is the cause of your hematuria, more tests may be needed to identify the cause of your symptoms. It is very important to tell your health care provider about any blood in your urine, even if it is painless or the blood stops without treatment. Blood in the urine, when it happens and then stops and then happens again, can be a symptom of a very serious condition, including cancer. There is no pain in the initial stages of many urinary cancers. Follow these instructions at home: Medicines Take over-the-counter and prescription medicines only as told by your health care provider. If you were prescribed an antibiotic medicine, take it as told by your health care provider. Do not stop taking the antibiotic even if you start to feel better. Eating and drinking Drink enough fluid to keep your urine pale yellow. It is recommended that you drink 3-4 quarts (2.8-3.8 L) a day. If you have been diagnosed with an infection, drinking cranberry juice in addition to large amounts of water is recommended. Avoid caffeine, tea, and carbonated beverages. These tend to irritate the bladder. Avoid alcohol because it may irritate the prostate (in males). General instructions If you have been diagnosed with a kidney stone, follow your health care provider's instructions about straining your urine to catch the  stone. Empty your bladder often. Avoid holding urine for long periods of time. If you are female: After a bowel movement, wipe from front to back and use each piece of toilet paper only once. Empty your bladder before and after sex. Pay attention to any changes in your symptoms. Tell your health care provider about any changes or any new symptoms. It is up to you to get the results of any tests. Ask your health care provider, or the department that is doing the test, when your results will be ready. Keep all follow-up visits. This is important. Contact a health care provider if: You develop back pain. You have a fever or chills. You have nausea or vomiting. Your symptoms do not improve after 3 days. Your symptoms get worse. Get help right away if: You develop severe vomiting and are unable to take medicine without vomiting. You develop severe pain in your back or abdomen even though you are taking medicine. You pass a large amount of blood in your urine. You pass blood clots in your urine. You feel very weak or like you might faint. You faint. Summary Hematuria is blood in the urine. It has many possible causes. It is very important that you tell your health care provider about any blood in your urine, even if it is painless or the blood stops without  treatment. Take over-the-counter and prescription medicines only as told by your health care provider. Drink enough fluid to keep your urine pale yellow. This information is not intended to replace advice given to you by your health care provider. Make sure you discuss any questions you have with your health care provider. Document Revised: 04/08/2020 Document Reviewed: 04/08/2020 Elsevier Patient Education  2022 [employer].

## 2021-10-26 NOTE — Progress Notes (Signed)
Acute Office Visit  Subjective:    Patient ID: Olivia Cruz, female    DOB: 08-28-1950, 71 y.o.   MRN: [id number]  CC: Generalized weakness  HPI: Olivia Cruz is a 71 year old Caucasian female that presents for evaluation of headache, generalized weakness, malaise, diaphoresis, and poor intake. Reports 10 pound weight loss in one week. She is accompanied by her husband. Onset of symptoms was 8 days ago. Denies previous treatment. Pt underwent intrathecal steroid injection on 10/18/21 at [facility] for chronic bilateral leg pain. States leg pain has improved.    Past Medical History:  Diagnosis Date   Allergic rhinoconjunctivitis    Anxiety    Asthma    Depression    Diverticulitis    GERD (gastroesophageal reflux disease)    Glaucoma    Hypothyroid    Insomnia    Laryngopharyngeal reflux (LPR)    Migraines    Osteoarthritis    Psoriatic arthritis ([place])    Skin cancer    STD (sexually transmitted disease)    HSV II    Past Surgical History:  Procedure Laterality Date   BELPHAROPTOSIS REPAIR     BREAST SURGERY Left    times 2   COLONOSCOPY  06/03/2013   Moderate predominantly sigmoid diverticulosis. Small interal hemorroids   FOOT SURGERY Right    Removed bone spur   GANGLION CYST EXCISION Left    foot   HEMORRHOID SURGERY     SKIN CANCER EXCISION     TOTAL KNEE ARTHROPLASTY Right 07/21/2020   UPPER GI ENDOSCOPY  03/23/2016   Mild gastritis, gastric polyps bxbenign squamous mucosa with no abnormaility, fundic glad poly in setting of mild chron gastritis.    Family History  Problem Relation Age of Onset   Breast cancer Sister        lung   Cancer Maternal Grandmother    Multiple births Maternal Grandmother    Multiple births Paternal Grandmother    Cancer Paternal Grandmother    Thyroid disease Mother    Heart failure Mother    Heart disease Mother    Hyperlipidemia Mother    Hypertension Mother    Stroke Mother    Depression Mother     Parkinson's disease Father    Glaucoma Father    Diverticulitis Father     Social History   Socioeconomic History   Marital status: Married    Spouse name: Not on file   Number of children: Not on file   Years of education: Not on file   Highest education level: Not on file  Occupational History   Not on file  Tobacco Use   Smoking status: Former    Types: Cigarettes    Quit date: 1986    Years since quitting: 37.2   Smokeless tobacco: Never  Vaping Use   Vaping Use: Never used  Substance and Sexual Activity   Alcohol use: No    Alcohol/week: 0.0 standard drinks   Drug use: No   Sexual activity: Yes    Partners: Male    Comment: husband vasectomy  Other Topics Concern   Not on file  Social History Narrative   Not on file   Social Determinants of Health   Financial Resource Strain: Low Risk    Difficulty of Paying Living Expenses: Not very hard  Food Insecurity: No Food Insecurity   Worried About Running Out of Food in the Last Year: Never true   [place] in the Last Year: Never true  Transportation Needs: No Data processing manager (Medical): No   Lack of Transportation (Non-Medical): No  Physical Activity: Sufficiently Active   Days of Exercise per Week: 5 days   Minutes of Exercise per Session: 30 min  Stress: No Stress Concern Present   Feeling of Stress : Only a little  Social Connections: Engineer, building services of Communication with Friends and Family: More than three times a week   Frequency of Social Gatherings with Friends and Family: More than three times a week   Attends Religious Services: More than 4 times per year   Active Member of [employer] or Organizations: Yes   Attends Music therapist: More than 4 times per year   Marital Status: Married  Human resources officer Violence: Not At Risk   Fear of Current or Ex-Partner: No   Emotionally Abused: No   Physically Abused: No   Sexually Abused: No     Outpatient Medications Prior to Visit  Medication Sig Dispense Refill   Adalimumab 40 MG/0.4ML PNKT Inject into the skin.     albuterol (VENTOLIN HFA) 108 (90 Base) MCG/ACT inhaler Inhale 2 puffs into the lungs every 6 (six) hours as needed for wheezing or shortness of breath. 8 g 2   Biotin w/ Vitamins C & E (HAIR/SKIN/NAILS PO) Take by mouth daily.     budesonide (RHINOCORT AQUA) 32 MCG/ACT nasal spray Place 1 spray into both nostrils daily. Reported on 02/04/2016 8.43 mL 3   Calcium Carbonate-Vitamin D (CALCIUM + D PO) Take 1,200 mg by mouth daily.     cholecalciferol (VITAMIN D3) 25 MCG (1000 UNIT) tablet Take 1,000 Units by mouth in the morning, at noon, and at bedtime.     diclofenac Sodium (VOLTAREN) 1 % GEL      DULoxetine (CYMBALTA) 60 MG capsule Take 1 capsule (60 mg total) by mouth 2 (two) times daily. 180 capsule 0   famotidine (PEPCID) 40 MG tablet TAKE 1 TABLET BY MOUTH EVERYDAY AT BEDTIME 30 tablet 5   FLOVENT HFA 110 MCG/ACT inhaler Inhale two puffs twice daily to prevent cough or wheeze.  Rinse, gargle, and spit after use. 12 g 5   FOLIC ACID PO Take by mouth daily.     gabapentin (NEURONTIN) 300 MG capsule Take 300 mg by mouth in the morning, at noon, and at bedtime.     ibuprofen (ADVIL) 200 MG tablet Take 400 mg by mouth as needed.     levothyroxine (SYNTHROID) 50 MCG tablet TAKE 1 TABLET BY MOUTH DAILY 90 tablet 3   Magnesium 250 MG TABS Take 250 mg by mouth daily.     Multiple Vitamin (MULTIVITAMIN) capsule Take 1 capsule by mouth daily.     pantoprazole (PROTONIX) 40 MG tablet TAKE 1 TABLET BY MOUTH DAILY 90 tablet 3   rosuvastatin (CRESTOR) 10 MG tablet TAKE 1 TABLET BY MOUTH DAILY 90 tablet 3   valACYclovir (VALTREX) 500 MG tablet Take 1 tablet (500 mg total) by mouth 3 (three) times daily as needed (herpes outbreak). 21 tablet 0   No facility-administered medications prior to visit.    Allergies  Allergen Reactions   Codeine Anaphylaxis and Nausea Only    Ambien [Zolpidem Tartrate] Other (See Comments)    Memory loss per patient   Cyproheptadine Hcl Other (See Comments)    Confusion   Dilaudid [Hydromorphone]     hallucinations   Hydrocodone Other (See Comments)    anaphylaxis   Lisinopril  Pramipexole Nausea Only   Sulfa Antibiotics Other (See Comments)    hives   Tramadol     Review of Systems  Constitutional:  Positive for appetite change (decreased), chills, diaphoresis, fatigue and unexpected weight change (weight loss). Negative for fever.  HENT:  Positive for rhinorrhea. Negative for congestion and sore throat.   Respiratory:  Negative for cough and shortness of breath.   Cardiovascular:  Negative for chest pain.  Gastrointestinal:  Positive for nausea.  Musculoskeletal:  Positive for arthralgias (chronic bilateral leg pain). Negative for back pain and myalgias.  Allergic/Immunologic: Positive for environmental allergies.  Neurological:  Positive for dizziness, weakness, light-headedness and headaches.  Psychiatric/Behavioral:  Positive for sleep disturbance (hypersomnolence).       Objective:   BP 110/80    Pulse (!) 108    Temp (!) 97.2 F (36.2 C)    Ht 5' 1.5" (1.562 m)    Wt 148 lb (67.1 kg)    LMP 08/22/2005    SpO2 98%    BMI 27.51 kg/m   Physical Exam Vitals reviewed.  Constitutional:      Appearance: She is ill-appearing (seated in wheelchair).  HENT:     Right Ear: Tympanic membrane normal.     Left Ear: Tympanic membrane normal.     Nose: Nose normal.     Mouth/Throat:     Mouth: Mucous membranes are dry.  Eyes:     Pupils: Pupils are equal, round, and reactive to light.  Cardiovascular:     Rate and Rhythm: Regular rhythm. Tachycardia present.     Pulses: Normal pulses.     Heart sounds: Normal heart sounds.  Pulmonary:     Effort: Pulmonary effort is normal.     Breath sounds: Normal breath sounds.  Abdominal:     General: Bowel sounds are normal.     Palpations: Abdomen is soft.   Musculoskeletal:     Comments: weakness  Skin:    General: Skin is warm and dry.     Capillary Refill: Capillary refill takes less than 2 seconds.  Neurological:     General: No focal deficit present.     Mental Status: She is alert and oriented to person, place, and time.  Psychiatric:        Mood and Affect: Mood normal.        Behavior: Behavior normal.    BP 110/80    Pulse (!) 108    Temp (!) 97.2 F (36.2 C)    Ht 5' 1.5" (1.562 m)    Wt 148 lb (67.1 kg)    LMP 08/22/2005    SpO2 98%    BMI 27.51 kg/m  Wt Readings from Last 3 Encounters:  10/26/21 148 lb (67.1 kg)  09/23/21 153 lb (69.4 kg)  09/02/21 156 lb 12.8 oz (71.1 kg)    Health Maintenance Due  Topic Date Due   Zoster Vaccines- Shingrix (2 of 2) 07/13/2018       Lab Results  Component Value Date   TSH 3.740 07/12/2021   Lab Results  Component Value Date   WBC 5.3 07/12/2021   HGB 14.0 07/12/2021   HCT 41.6 07/12/2021   MCV 87 07/12/2021   PLT 283 07/12/2021   Lab Results  Component Value Date   NA 140 07/12/2021   K 4.1 07/12/2021   CO2 23 07/12/2021   GLUCOSE 89 07/12/2021   BUN 13 07/12/2021   CREATININE 0.76 07/12/2021   BILITOT 0.3 07/12/2021  ALKPHOS 80 07/12/2021   AST 28 07/12/2021   ALT 19 07/12/2021   PROT 6.4 07/12/2021   ALBUMIN 4.3 07/12/2021   CALCIUM 9.8 07/12/2021   EGFR 84 07/12/2021   Lab Results  Component Value Date   CHOL 114 07/12/2021   Lab Results  Component Value Date   HDL 52 07/12/2021   Lab Results  Component Value Date   LDLCALC 44 07/12/2021   Lab Results  Component Value Date   TRIG 93 07/12/2021   Lab Results  Component Value Date   CHOLHDL 2.2 07/12/2021   Lab Results  Component Value Date   HGBA1C 5.5 07/12/2021       Assessment & Plan:   1. Steroid-induced hyperglycemia - POCT URINALYSIS DIP (CLINITEK) - Urine Culture - POCT glucose (manual entry) - sitaGLIPtin-metformin (JANUMET) 50-500 MG tablet; Take 1 tablet by mouth 2  (two) times daily with a meal.  Dispense: 14 tablet; Refill: 0 - CBC with Differential/Platelet - Comprehensive metabolic panel - Sedimentation Rate  2. Generalized weakness - POCT URINALYSIS DIP (CLINITEK) - Urine Culture - POCT glucose (manual entry) - CBC with Differential/Platelet - Comprehensive metabolic panel - Sedimentation Rate - POC COVID-19-NEGATIVE  3. Tachycardia - CBC with Differential/Platelet - Comprehensive metabolic panel - Sedimentation Rate  4. Hematuria, unspecified type - cefdinir (OMNICEF) 300 MG capsule; Take 1 capsule (300 mg total) by mouth 2 (two) times daily.  Dispense: 10 capsule; Refill: 0 - CBC with Differential/Platelet - Comprehensive metabolic panel - Sedimentation Rate  5. Other proteinuria - CBC with Differential/Platelet - Comprehensive metabolic panel - Sedimentation Rate  6. Nausea - ondansetron (ZOFRAN) 4 MG tablet; Take 1 tablet (4 mg total) by mouth every 8 (eight) hours as needed for nausea or vomiting.  Dispense: 20 tablet; Refill: 0 - POC COVID-19   Begin Janumet for elevated blood sugar Take Zofran as needed for nausea Take Cefdinir twice daily for 5 days We will call you with lab results Push fluids, especially water Increase food intake       Follow-up: PRN, pending lab results  An After Visit Summary was printed and given to the patient.  I, Rip Harbour, NP, have reviewed all documentation for this visit. The documentation on 10/26/21 for the exam, diagnosis, procedures, and orders are all accurate and complete.    Signed, Rip Harbour, NP [place] [phone]

## 2021-10-27 DIAGNOSIS — F32A Depression, unspecified: Secondary | ICD-10-CM | POA: Diagnosis not present

## 2021-10-27 DIAGNOSIS — M1990 Unspecified osteoarthritis, unspecified site: Secondary | ICD-10-CM | POA: Diagnosis not present

## 2021-10-27 DIAGNOSIS — R5381 Other malaise: Secondary | ICD-10-CM | POA: Diagnosis not present

## 2021-10-27 DIAGNOSIS — R5383 Other fatigue: Secondary | ICD-10-CM | POA: Diagnosis not present

## 2021-10-27 DIAGNOSIS — Z79899 Other long term (current) drug therapy: Secondary | ICD-10-CM | POA: Diagnosis not present

## 2021-10-27 DIAGNOSIS — R7401 Elevation of levels of liver transaminase levels: Secondary | ICD-10-CM | POA: Diagnosis not present

## 2021-10-27 DIAGNOSIS — R2689 Other abnormalities of gait and mobility: Secondary | ICD-10-CM | POA: Diagnosis not present

## 2021-10-27 DIAGNOSIS — L4050 Arthropathic psoriasis, unspecified: Secondary | ICD-10-CM | POA: Diagnosis not present

## 2021-10-27 DIAGNOSIS — I10 Essential (primary) hypertension: Secondary | ICD-10-CM | POA: Diagnosis not present

## 2021-10-27 DIAGNOSIS — R809 Proteinuria, unspecified: Secondary | ICD-10-CM | POA: Diagnosis not present

## 2021-10-27 DIAGNOSIS — N179 Acute kidney failure, unspecified: Secondary | ICD-10-CM | POA: Diagnosis not present

## 2021-10-27 DIAGNOSIS — G2581 Restless legs syndrome: Secondary | ICD-10-CM | POA: Diagnosis not present

## 2021-10-27 DIAGNOSIS — E861 Hypovolemia: Secondary | ICD-10-CM | POA: Diagnosis not present

## 2021-10-27 DIAGNOSIS — M8580 Other specified disorders of bone density and structure, unspecified site: Secondary | ICD-10-CM | POA: Diagnosis not present

## 2021-10-27 DIAGNOSIS — R531 Weakness: Secondary | ICD-10-CM | POA: Diagnosis not present

## 2021-10-27 DIAGNOSIS — Z87891 Personal history of nicotine dependence: Secondary | ICD-10-CM | POA: Diagnosis not present

## 2021-10-27 DIAGNOSIS — E039 Hypothyroidism, unspecified: Secondary | ICD-10-CM | POA: Diagnosis not present

## 2021-10-27 DIAGNOSIS — E871 Hypo-osmolality and hyponatremia: Secondary | ICD-10-CM | POA: Diagnosis not present

## 2021-10-27 DIAGNOSIS — M62542 Muscle wasting and atrophy, not elsewhere classified, left hand: Secondary | ICD-10-CM | POA: Diagnosis not present

## 2021-10-27 DIAGNOSIS — J45909 Unspecified asthma, uncomplicated: Secondary | ICD-10-CM | POA: Diagnosis not present

## 2021-10-27 DIAGNOSIS — R490 Dysphonia: Secondary | ICD-10-CM | POA: Diagnosis not present

## 2021-10-27 DIAGNOSIS — K219 Gastro-esophageal reflux disease without esophagitis: Secondary | ICD-10-CM | POA: Diagnosis not present

## 2021-10-27 DIAGNOSIS — E785 Hyperlipidemia, unspecified: Secondary | ICD-10-CM | POA: Diagnosis not present

## 2021-10-28 DIAGNOSIS — R490 Dysphonia: Secondary | ICD-10-CM | POA: Diagnosis not present

## 2021-10-28 DIAGNOSIS — Z7409 Other reduced mobility: Secondary | ICD-10-CM | POA: Diagnosis not present

## 2021-10-28 DIAGNOSIS — N179 Acute kidney failure, unspecified: Secondary | ICD-10-CM | POA: Diagnosis not present

## 2021-10-28 DIAGNOSIS — L4050 Arthropathic psoriasis, unspecified: Secondary | ICD-10-CM | POA: Diagnosis not present

## 2021-10-28 DIAGNOSIS — E785 Hyperlipidemia, unspecified: Secondary | ICD-10-CM | POA: Diagnosis not present

## 2021-10-28 DIAGNOSIS — R7989 Other specified abnormal findings of blood chemistry: Secondary | ICD-10-CM | POA: Diagnosis not present

## 2021-10-28 DIAGNOSIS — R7401 Elevation of levels of liver transaminase levels: Secondary | ICD-10-CM | POA: Diagnosis not present

## 2021-10-28 DIAGNOSIS — R5383 Other fatigue: Secondary | ICD-10-CM | POA: Diagnosis not present

## 2021-10-28 DIAGNOSIS — R700 Elevated erythrocyte sedimentation rate: Secondary | ICD-10-CM | POA: Diagnosis not present

## 2021-10-28 DIAGNOSIS — F32A Depression, unspecified: Secondary | ICD-10-CM | POA: Diagnosis not present

## 2021-10-28 DIAGNOSIS — G2581 Restless legs syndrome: Secondary | ICD-10-CM | POA: Diagnosis not present

## 2021-10-28 DIAGNOSIS — E039 Hypothyroidism, unspecified: Secondary | ICD-10-CM | POA: Diagnosis not present

## 2021-10-28 DIAGNOSIS — Z789 Other specified health status: Secondary | ICD-10-CM | POA: Diagnosis not present

## 2021-10-28 DIAGNOSIS — K219 Gastro-esophageal reflux disease without esophagitis: Secondary | ICD-10-CM | POA: Diagnosis not present

## 2021-10-28 DIAGNOSIS — E871 Hypo-osmolality and hyponatremia: Secondary | ICD-10-CM | POA: Diagnosis not present

## 2021-10-28 DIAGNOSIS — R809 Proteinuria, unspecified: Secondary | ICD-10-CM | POA: Diagnosis not present

## 2021-10-28 LAB — URINE CULTURE

## 2021-10-29 DIAGNOSIS — K219 Gastro-esophageal reflux disease without esophagitis: Secondary | ICD-10-CM | POA: Diagnosis not present

## 2021-10-29 DIAGNOSIS — J4520 Mild intermittent asthma, uncomplicated: Secondary | ICD-10-CM | POA: Diagnosis not present

## 2021-10-29 DIAGNOSIS — R7401 Elevation of levels of liver transaminase levels: Secondary | ICD-10-CM | POA: Diagnosis not present

## 2021-10-29 DIAGNOSIS — E785 Hyperlipidemia, unspecified: Secondary | ICD-10-CM | POA: Diagnosis not present

## 2021-10-29 DIAGNOSIS — L4050 Arthropathic psoriasis, unspecified: Secondary | ICD-10-CM | POA: Diagnosis not present

## 2021-10-29 DIAGNOSIS — R5383 Other fatigue: Secondary | ICD-10-CM | POA: Diagnosis not present

## 2021-10-29 DIAGNOSIS — E039 Hypothyroidism, unspecified: Secondary | ICD-10-CM | POA: Diagnosis not present

## 2021-10-29 DIAGNOSIS — F32A Depression, unspecified: Secondary | ICD-10-CM | POA: Diagnosis not present

## 2021-10-29 DIAGNOSIS — G2581 Restless legs syndrome: Secondary | ICD-10-CM | POA: Diagnosis not present

## 2021-10-29 DIAGNOSIS — M8580 Other specified disorders of bone density and structure, unspecified site: Secondary | ICD-10-CM | POA: Diagnosis not present

## 2021-10-29 DIAGNOSIS — E871 Hypo-osmolality and hyponatremia: Secondary | ICD-10-CM | POA: Diagnosis not present

## 2021-11-02 DIAGNOSIS — M8580 Other specified disorders of bone density and structure, unspecified site: Secondary | ICD-10-CM | POA: Diagnosis not present

## 2021-11-02 DIAGNOSIS — E039 Hypothyroidism, unspecified: Secondary | ICD-10-CM | POA: Diagnosis not present

## 2021-11-02 DIAGNOSIS — L4050 Arthropathic psoriasis, unspecified: Secondary | ICD-10-CM | POA: Diagnosis not present

## 2021-11-02 DIAGNOSIS — Z792 Long term (current) use of antibiotics: Secondary | ICD-10-CM | POA: Diagnosis not present

## 2021-11-02 DIAGNOSIS — Z96651 Presence of right artificial knee joint: Secondary | ICD-10-CM | POA: Diagnosis not present

## 2021-11-02 DIAGNOSIS — E871 Hypo-osmolality and hyponatremia: Secondary | ICD-10-CM | POA: Diagnosis not present

## 2021-11-02 DIAGNOSIS — F32A Depression, unspecified: Secondary | ICD-10-CM | POA: Diagnosis not present

## 2021-11-02 DIAGNOSIS — K5792 Diverticulitis of intestine, part unspecified, without perforation or abscess without bleeding: Secondary | ICD-10-CM | POA: Diagnosis not present

## 2021-11-02 DIAGNOSIS — G4733 Obstructive sleep apnea (adult) (pediatric): Secondary | ICD-10-CM | POA: Diagnosis not present

## 2021-11-02 DIAGNOSIS — G2581 Restless legs syndrome: Secondary | ICD-10-CM | POA: Diagnosis not present

## 2021-11-02 DIAGNOSIS — Z791 Long term (current) use of non-steroidal anti-inflammatories (NSAID): Secondary | ICD-10-CM | POA: Diagnosis not present

## 2021-11-02 DIAGNOSIS — K219 Gastro-esophageal reflux disease without esophagitis: Secondary | ICD-10-CM | POA: Diagnosis not present

## 2021-11-02 DIAGNOSIS — I10 Essential (primary) hypertension: Secondary | ICD-10-CM | POA: Diagnosis not present

## 2021-11-02 DIAGNOSIS — Z87891 Personal history of nicotine dependence: Secondary | ICD-10-CM | POA: Diagnosis not present

## 2021-11-02 DIAGNOSIS — M1990 Unspecified osteoarthritis, unspecified site: Secondary | ICD-10-CM | POA: Diagnosis not present

## 2021-11-02 DIAGNOSIS — E785 Hyperlipidemia, unspecified: Secondary | ICD-10-CM | POA: Diagnosis not present

## 2021-11-02 DIAGNOSIS — J383 Other diseases of vocal cords: Secondary | ICD-10-CM | POA: Diagnosis not present

## 2021-11-02 DIAGNOSIS — J4520 Mild intermittent asthma, uncomplicated: Secondary | ICD-10-CM | POA: Diagnosis not present

## 2021-11-03 ENCOUNTER — Telehealth: Payer: Self-pay

## 2021-11-03 DIAGNOSIS — L4050 Arthropathic psoriasis, unspecified: Secondary | ICD-10-CM | POA: Diagnosis not present

## 2021-11-03 DIAGNOSIS — G2581 Restless legs syndrome: Secondary | ICD-10-CM | POA: Diagnosis not present

## 2021-11-03 DIAGNOSIS — R809 Proteinuria, unspecified: Secondary | ICD-10-CM | POA: Diagnosis not present

## 2021-11-03 DIAGNOSIS — K219 Gastro-esophageal reflux disease without esophagitis: Secondary | ICD-10-CM | POA: Diagnosis not present

## 2021-11-03 DIAGNOSIS — E039 Hypothyroidism, unspecified: Secondary | ICD-10-CM | POA: Diagnosis not present

## 2021-11-03 DIAGNOSIS — J45909 Unspecified asthma, uncomplicated: Secondary | ICD-10-CM | POA: Diagnosis not present

## 2021-11-03 DIAGNOSIS — J309 Allergic rhinitis, unspecified: Secondary | ICD-10-CM | POA: Diagnosis not present

## 2021-11-03 NOTE — Telephone Encounter (Signed)
Physical therapy w/ [facility] called requesting verbal orders. Schedule once a week for one week, twice a week for four weeks, and once a week for four weeks. ? ?Verbal orders given.  ? ?Harrell Lark 11/03/21 1:16 PM ? ?

## 2021-11-04 ENCOUNTER — Telehealth: Payer: Self-pay

## 2021-11-04 NOTE — Telephone Encounter (Signed)
LM for patient to call me back about a records request. ?

## 2021-11-05 DIAGNOSIS — Z87891 Personal history of nicotine dependence: Secondary | ICD-10-CM | POA: Diagnosis not present

## 2021-11-05 DIAGNOSIS — J4520 Mild intermittent asthma, uncomplicated: Secondary | ICD-10-CM | POA: Diagnosis not present

## 2021-11-05 DIAGNOSIS — G2581 Restless legs syndrome: Secondary | ICD-10-CM | POA: Diagnosis not present

## 2021-11-05 DIAGNOSIS — M1990 Unspecified osteoarthritis, unspecified site: Secondary | ICD-10-CM | POA: Diagnosis not present

## 2021-11-05 DIAGNOSIS — G4733 Obstructive sleep apnea (adult) (pediatric): Secondary | ICD-10-CM | POA: Diagnosis not present

## 2021-11-05 DIAGNOSIS — L4050 Arthropathic psoriasis, unspecified: Secondary | ICD-10-CM | POA: Diagnosis not present

## 2021-11-05 DIAGNOSIS — F32A Depression, unspecified: Secondary | ICD-10-CM | POA: Diagnosis not present

## 2021-11-05 DIAGNOSIS — I10 Essential (primary) hypertension: Secondary | ICD-10-CM | POA: Diagnosis not present

## 2021-11-05 DIAGNOSIS — E871 Hypo-osmolality and hyponatremia: Secondary | ICD-10-CM | POA: Diagnosis not present

## 2021-11-05 DIAGNOSIS — K219 Gastro-esophageal reflux disease without esophagitis: Secondary | ICD-10-CM | POA: Diagnosis not present

## 2021-11-05 DIAGNOSIS — Z791 Long term (current) use of non-steroidal anti-inflammatories (NSAID): Secondary | ICD-10-CM | POA: Diagnosis not present

## 2021-11-05 DIAGNOSIS — E785 Hyperlipidemia, unspecified: Secondary | ICD-10-CM | POA: Diagnosis not present

## 2021-11-05 DIAGNOSIS — J383 Other diseases of vocal cords: Secondary | ICD-10-CM | POA: Diagnosis not present

## 2021-11-05 DIAGNOSIS — E039 Hypothyroidism, unspecified: Secondary | ICD-10-CM | POA: Diagnosis not present

## 2021-11-05 DIAGNOSIS — Z792 Long term (current) use of antibiotics: Secondary | ICD-10-CM | POA: Diagnosis not present

## 2021-11-05 DIAGNOSIS — Z96651 Presence of right artificial knee joint: Secondary | ICD-10-CM | POA: Diagnosis not present

## 2021-11-05 DIAGNOSIS — M8580 Other specified disorders of bone density and structure, unspecified site: Secondary | ICD-10-CM | POA: Diagnosis not present

## 2021-11-05 DIAGNOSIS — K5792 Diverticulitis of intestine, part unspecified, without perforation or abscess without bleeding: Secondary | ICD-10-CM | POA: Diagnosis not present

## 2021-11-08 DIAGNOSIS — L4050 Arthropathic psoriasis, unspecified: Secondary | ICD-10-CM | POA: Diagnosis not present

## 2021-11-08 DIAGNOSIS — R809 Proteinuria, unspecified: Secondary | ICD-10-CM | POA: Diagnosis not present

## 2021-11-08 DIAGNOSIS — R319 Hematuria, unspecified: Secondary | ICD-10-CM | POA: Diagnosis not present

## 2021-11-09 DIAGNOSIS — J4520 Mild intermittent asthma, uncomplicated: Secondary | ICD-10-CM | POA: Diagnosis not present

## 2021-11-09 DIAGNOSIS — G4733 Obstructive sleep apnea (adult) (pediatric): Secondary | ICD-10-CM | POA: Diagnosis not present

## 2021-11-09 DIAGNOSIS — Z87891 Personal history of nicotine dependence: Secondary | ICD-10-CM | POA: Diagnosis not present

## 2021-11-09 DIAGNOSIS — Z791 Long term (current) use of non-steroidal anti-inflammatories (NSAID): Secondary | ICD-10-CM | POA: Diagnosis not present

## 2021-11-09 DIAGNOSIS — E039 Hypothyroidism, unspecified: Secondary | ICD-10-CM | POA: Diagnosis not present

## 2021-11-09 DIAGNOSIS — I10 Essential (primary) hypertension: Secondary | ICD-10-CM | POA: Diagnosis not present

## 2021-11-09 DIAGNOSIS — E785 Hyperlipidemia, unspecified: Secondary | ICD-10-CM | POA: Diagnosis not present

## 2021-11-09 DIAGNOSIS — L4050 Arthropathic psoriasis, unspecified: Secondary | ICD-10-CM | POA: Diagnosis not present

## 2021-11-09 DIAGNOSIS — K219 Gastro-esophageal reflux disease without esophagitis: Secondary | ICD-10-CM | POA: Diagnosis not present

## 2021-11-09 DIAGNOSIS — Z792 Long term (current) use of antibiotics: Secondary | ICD-10-CM | POA: Diagnosis not present

## 2021-11-09 DIAGNOSIS — M8580 Other specified disorders of bone density and structure, unspecified site: Secondary | ICD-10-CM | POA: Diagnosis not present

## 2021-11-09 DIAGNOSIS — Z96651 Presence of right artificial knee joint: Secondary | ICD-10-CM | POA: Diagnosis not present

## 2021-11-09 DIAGNOSIS — J383 Other diseases of vocal cords: Secondary | ICD-10-CM | POA: Diagnosis not present

## 2021-11-09 DIAGNOSIS — F32A Depression, unspecified: Secondary | ICD-10-CM | POA: Diagnosis not present

## 2021-11-09 DIAGNOSIS — E871 Hypo-osmolality and hyponatremia: Secondary | ICD-10-CM | POA: Diagnosis not present

## 2021-11-09 DIAGNOSIS — M1990 Unspecified osteoarthritis, unspecified site: Secondary | ICD-10-CM | POA: Diagnosis not present

## 2021-11-09 DIAGNOSIS — G2581 Restless legs syndrome: Secondary | ICD-10-CM | POA: Diagnosis not present

## 2021-11-09 DIAGNOSIS — K5792 Diverticulitis of intestine, part unspecified, without perforation or abscess without bleeding: Secondary | ICD-10-CM | POA: Diagnosis not present

## 2021-11-10 DIAGNOSIS — E871 Hypo-osmolality and hyponatremia: Secondary | ICD-10-CM | POA: Diagnosis not present

## 2021-11-11 ENCOUNTER — Ambulatory Visit: Payer: Medicare HMO | Admitting: Family Medicine

## 2021-11-11 DIAGNOSIS — G2581 Restless legs syndrome: Secondary | ICD-10-CM | POA: Diagnosis not present

## 2021-11-11 DIAGNOSIS — L4050 Arthropathic psoriasis, unspecified: Secondary | ICD-10-CM | POA: Diagnosis not present

## 2021-11-11 DIAGNOSIS — Z96651 Presence of right artificial knee joint: Secondary | ICD-10-CM | POA: Diagnosis not present

## 2021-11-11 DIAGNOSIS — Z87891 Personal history of nicotine dependence: Secondary | ICD-10-CM | POA: Diagnosis not present

## 2021-11-11 DIAGNOSIS — M8580 Other specified disorders of bone density and structure, unspecified site: Secondary | ICD-10-CM | POA: Diagnosis not present

## 2021-11-11 DIAGNOSIS — E871 Hypo-osmolality and hyponatremia: Secondary | ICD-10-CM | POA: Diagnosis not present

## 2021-11-11 DIAGNOSIS — I10 Essential (primary) hypertension: Secondary | ICD-10-CM | POA: Diagnosis not present

## 2021-11-11 DIAGNOSIS — J4520 Mild intermittent asthma, uncomplicated: Secondary | ICD-10-CM | POA: Diagnosis not present

## 2021-11-11 DIAGNOSIS — K5792 Diverticulitis of intestine, part unspecified, without perforation or abscess without bleeding: Secondary | ICD-10-CM | POA: Diagnosis not present

## 2021-11-11 DIAGNOSIS — Z792 Long term (current) use of antibiotics: Secondary | ICD-10-CM | POA: Diagnosis not present

## 2021-11-11 DIAGNOSIS — G4733 Obstructive sleep apnea (adult) (pediatric): Secondary | ICD-10-CM | POA: Diagnosis not present

## 2021-11-11 DIAGNOSIS — E039 Hypothyroidism, unspecified: Secondary | ICD-10-CM | POA: Diagnosis not present

## 2021-11-11 DIAGNOSIS — E785 Hyperlipidemia, unspecified: Secondary | ICD-10-CM | POA: Diagnosis not present

## 2021-11-11 DIAGNOSIS — F32A Depression, unspecified: Secondary | ICD-10-CM | POA: Diagnosis not present

## 2021-11-11 DIAGNOSIS — J383 Other diseases of vocal cords: Secondary | ICD-10-CM | POA: Diagnosis not present

## 2021-11-11 DIAGNOSIS — Z791 Long term (current) use of non-steroidal anti-inflammatories (NSAID): Secondary | ICD-10-CM | POA: Diagnosis not present

## 2021-11-11 DIAGNOSIS — K219 Gastro-esophageal reflux disease without esophagitis: Secondary | ICD-10-CM | POA: Diagnosis not present

## 2021-11-11 DIAGNOSIS — M1990 Unspecified osteoarthritis, unspecified site: Secondary | ICD-10-CM | POA: Diagnosis not present

## 2021-11-16 DIAGNOSIS — G4733 Obstructive sleep apnea (adult) (pediatric): Secondary | ICD-10-CM | POA: Diagnosis not present

## 2021-11-16 DIAGNOSIS — Z87891 Personal history of nicotine dependence: Secondary | ICD-10-CM | POA: Diagnosis not present

## 2021-11-16 DIAGNOSIS — E039 Hypothyroidism, unspecified: Secondary | ICD-10-CM | POA: Diagnosis not present

## 2021-11-16 DIAGNOSIS — Z792 Long term (current) use of antibiotics: Secondary | ICD-10-CM | POA: Diagnosis not present

## 2021-11-16 DIAGNOSIS — E785 Hyperlipidemia, unspecified: Secondary | ICD-10-CM | POA: Diagnosis not present

## 2021-11-16 DIAGNOSIS — J383 Other diseases of vocal cords: Secondary | ICD-10-CM | POA: Diagnosis not present

## 2021-11-16 DIAGNOSIS — M1990 Unspecified osteoarthritis, unspecified site: Secondary | ICD-10-CM | POA: Diagnosis not present

## 2021-11-16 DIAGNOSIS — J4520 Mild intermittent asthma, uncomplicated: Secondary | ICD-10-CM | POA: Diagnosis not present

## 2021-11-16 DIAGNOSIS — I10 Essential (primary) hypertension: Secondary | ICD-10-CM | POA: Diagnosis not present

## 2021-11-16 DIAGNOSIS — K219 Gastro-esophageal reflux disease without esophagitis: Secondary | ICD-10-CM | POA: Diagnosis not present

## 2021-11-16 DIAGNOSIS — K5792 Diverticulitis of intestine, part unspecified, without perforation or abscess without bleeding: Secondary | ICD-10-CM | POA: Diagnosis not present

## 2021-11-16 DIAGNOSIS — G2581 Restless legs syndrome: Secondary | ICD-10-CM | POA: Diagnosis not present

## 2021-11-16 DIAGNOSIS — Z96651 Presence of right artificial knee joint: Secondary | ICD-10-CM | POA: Diagnosis not present

## 2021-11-16 DIAGNOSIS — M8580 Other specified disorders of bone density and structure, unspecified site: Secondary | ICD-10-CM | POA: Diagnosis not present

## 2021-11-16 DIAGNOSIS — F32A Depression, unspecified: Secondary | ICD-10-CM | POA: Diagnosis not present

## 2021-11-16 DIAGNOSIS — Z791 Long term (current) use of non-steroidal anti-inflammatories (NSAID): Secondary | ICD-10-CM | POA: Diagnosis not present

## 2021-11-16 DIAGNOSIS — E871 Hypo-osmolality and hyponatremia: Secondary | ICD-10-CM | POA: Diagnosis not present

## 2021-11-16 DIAGNOSIS — L4050 Arthropathic psoriasis, unspecified: Secondary | ICD-10-CM | POA: Diagnosis not present

## 2021-11-18 DIAGNOSIS — F32A Depression, unspecified: Secondary | ICD-10-CM | POA: Diagnosis not present

## 2021-11-18 DIAGNOSIS — E039 Hypothyroidism, unspecified: Secondary | ICD-10-CM | POA: Diagnosis not present

## 2021-11-18 DIAGNOSIS — Z87891 Personal history of nicotine dependence: Secondary | ICD-10-CM | POA: Diagnosis not present

## 2021-11-18 DIAGNOSIS — E871 Hypo-osmolality and hyponatremia: Secondary | ICD-10-CM | POA: Diagnosis not present

## 2021-11-18 DIAGNOSIS — L4050 Arthropathic psoriasis, unspecified: Secondary | ICD-10-CM | POA: Diagnosis not present

## 2021-11-18 DIAGNOSIS — G2581 Restless legs syndrome: Secondary | ICD-10-CM | POA: Diagnosis not present

## 2021-11-18 DIAGNOSIS — I10 Essential (primary) hypertension: Secondary | ICD-10-CM | POA: Diagnosis not present

## 2021-11-18 DIAGNOSIS — M8580 Other specified disorders of bone density and structure, unspecified site: Secondary | ICD-10-CM | POA: Diagnosis not present

## 2021-11-18 DIAGNOSIS — G4733 Obstructive sleep apnea (adult) (pediatric): Secondary | ICD-10-CM | POA: Diagnosis not present

## 2021-11-18 DIAGNOSIS — Z792 Long term (current) use of antibiotics: Secondary | ICD-10-CM | POA: Diagnosis not present

## 2021-11-18 DIAGNOSIS — Z96651 Presence of right artificial knee joint: Secondary | ICD-10-CM | POA: Diagnosis not present

## 2021-11-18 DIAGNOSIS — M1990 Unspecified osteoarthritis, unspecified site: Secondary | ICD-10-CM | POA: Diagnosis not present

## 2021-11-18 DIAGNOSIS — J4520 Mild intermittent asthma, uncomplicated: Secondary | ICD-10-CM | POA: Diagnosis not present

## 2021-11-18 DIAGNOSIS — J383 Other diseases of vocal cords: Secondary | ICD-10-CM | POA: Diagnosis not present

## 2021-11-18 DIAGNOSIS — K5792 Diverticulitis of intestine, part unspecified, without perforation or abscess without bleeding: Secondary | ICD-10-CM | POA: Diagnosis not present

## 2021-11-18 DIAGNOSIS — K219 Gastro-esophageal reflux disease without esophagitis: Secondary | ICD-10-CM | POA: Diagnosis not present

## 2021-11-18 DIAGNOSIS — Z791 Long term (current) use of non-steroidal anti-inflammatories (NSAID): Secondary | ICD-10-CM | POA: Diagnosis not present

## 2021-11-18 DIAGNOSIS — E785 Hyperlipidemia, unspecified: Secondary | ICD-10-CM | POA: Diagnosis not present

## 2021-11-26 DIAGNOSIS — L4050 Arthropathic psoriasis, unspecified: Secondary | ICD-10-CM | POA: Diagnosis not present

## 2021-11-26 DIAGNOSIS — J329 Chronic sinusitis, unspecified: Secondary | ICD-10-CM | POA: Diagnosis not present

## 2021-11-26 DIAGNOSIS — Z79899 Other long term (current) drug therapy: Secondary | ICD-10-CM | POA: Diagnosis not present

## 2021-11-30 DIAGNOSIS — M4316 Spondylolisthesis, lumbar region: Secondary | ICD-10-CM | POA: Diagnosis not present

## 2021-12-15 DIAGNOSIS — T8484XA Pain due to internal orthopedic prosthetic devices, implants and grafts, initial encounter: Secondary | ICD-10-CM | POA: Diagnosis not present

## 2021-12-15 DIAGNOSIS — M25461 Effusion, right knee: Secondary | ICD-10-CM | POA: Diagnosis not present

## 2021-12-15 DIAGNOSIS — Z471 Aftercare following joint replacement surgery: Secondary | ICD-10-CM | POA: Diagnosis not present

## 2021-12-15 DIAGNOSIS — Z96651 Presence of right artificial knee joint: Secondary | ICD-10-CM | POA: Diagnosis not present

## 2021-12-20 ENCOUNTER — Other Ambulatory Visit: Payer: Self-pay | Admitting: Family Medicine

## 2021-12-24 DIAGNOSIS — T8484XA Pain due to internal orthopedic prosthetic devices, implants and grafts, initial encounter: Secondary | ICD-10-CM | POA: Diagnosis not present

## 2021-12-24 DIAGNOSIS — M25861 Other specified joint disorders, right knee: Secondary | ICD-10-CM | POA: Diagnosis not present

## 2021-12-24 DIAGNOSIS — Z96651 Presence of right artificial knee joint: Secondary | ICD-10-CM | POA: Diagnosis not present

## 2021-12-24 DIAGNOSIS — Z471 Aftercare following joint replacement surgery: Secondary | ICD-10-CM | POA: Diagnosis not present

## 2021-12-28 DIAGNOSIS — J309 Allergic rhinitis, unspecified: Secondary | ICD-10-CM | POA: Diagnosis not present

## 2021-12-28 DIAGNOSIS — R059 Cough, unspecified: Secondary | ICD-10-CM | POA: Diagnosis not present

## 2021-12-28 DIAGNOSIS — J45901 Unspecified asthma with (acute) exacerbation: Secondary | ICD-10-CM | POA: Diagnosis not present

## 2021-12-29 DIAGNOSIS — Z96651 Presence of right artificial knee joint: Secondary | ICD-10-CM

## 2021-12-29 DIAGNOSIS — Z471 Aftercare following joint replacement surgery: Secondary | ICD-10-CM | POA: Diagnosis not present

## 2021-12-29 HISTORY — DX: Presence of right artificial knee joint: Z96.651

## 2022-01-05 ENCOUNTER — Encounter: Payer: Self-pay | Admitting: Allergy and Immunology

## 2022-01-05 ENCOUNTER — Ambulatory Visit: Payer: Medicare Other | Admitting: Allergy and Immunology

## 2022-01-05 VITALS — BP 126/84 | HR 78 | Resp 16

## 2022-01-05 DIAGNOSIS — K219 Gastro-esophageal reflux disease without esophagitis: Secondary | ICD-10-CM | POA: Diagnosis not present

## 2022-01-05 DIAGNOSIS — J3089 Other allergic rhinitis: Secondary | ICD-10-CM | POA: Diagnosis not present

## 2022-01-05 DIAGNOSIS — J4530 Mild persistent asthma, uncomplicated: Secondary | ICD-10-CM

## 2022-01-05 NOTE — Patient Instructions (Addendum)
?  1.  Treat and prevent inflammation: ? ?A. Flovent 110 - 2 inhalations 2 times per day w/spacer (empty lungs) ?B. Budesonide - 2 sprays each nostril 1-7 times per week ? ?2.  Treat and prevent reflux/LPR: ? ?A. Pantoprazole 40 mg - 1 tablet 1 time per day ?B. Famotidine 40 mg - 1 tablet in evening ? ?3.  If needed: ? ?A. Nasal saline ?B. OTC Cetirizine 10 mg - 1 tablet 1 time per day ?C. Albuterol HFA - 2 inhalations every 4-6 hours ? ?4.  Return in 12 weeks or earlier if problem.   ?

## 2022-01-05 NOTE — Progress Notes (Signed)
[name] [place] [facility] [place]   Follow-up Note  Referring Provider: No ref. provider found Primary Provider: Donald Prose, MD Date of Office Visit: 01/05/2022  Subjective:   Olivia Cruz (DOB: 30-May-1951) is a 71 y.o. female who returns to the [facility] [place] on 01/05/2022 in re-evaluation of the following:  HPI: Jeffery returns to this clinic in evaluation of asthma, allergic rhinitis, LPR.  Her last visit to this clinic was 21 October 2021.  When I last saw her in this clinic she was actually doing very well regarding her chronic cough which appeared to be a combination of inflammation of her airway and reflux induced respiratory disease and we made an attempt to lower her dose of Flovent with that visit.  Unfortunately, she had some type of idiosyncratic reaction to the injection that was placed into her back for her radiculopathy and she actually ended up with a hospitalization of 3 days duration for weakness and CNS side effects.  Fortunately, that appears to have resolved.  For some reason she was told to discontinue her Flovent and discontinue her famotidine during that hospitalization.  She was doing relatively well but on 24 Dec 2021 she once again developed cough and wheezing and this did not improve over the course of the week and she was given prednisone and she is better today.  It should be noted that this entire coughing episode started with laryngitis.  She did COVID test herself which was negative.  There is a question of whether or not her methotrexate use is responsible for some of her respiratory tract issues.  I spoke with her neurologist yesterday and the plan at this point in time is to have her cough get under very good control and obtain a high-resolution CT scan looking for some evidence of interstitial lung disease as a result of methotrexate use and then to reinitiate the use of methotrexate to see if this precipitates a cough  suggesting that there might be a hypersensitivity disorder associated with this administration.  If she does redevelop cough within a few weeks of utilizing her methotrexate then we will probably need to get a bronchoscopy to look for CD8 pulmonary lymphocytosis associated with hypersensitivity reaction.  Allergies as of 01/05/2022       Reactions   Codeine Anaphylaxis, Nausea Only   Ambien [zolpidem Tartrate] Other (See Comments)   Memory loss per patient   Cyproheptadine Hcl Other (See Comments)   Confusion   Dilaudid [hydromorphone]    hallucinations   Hydrocodone Other (See Comments)   anaphylaxis   Lisinopril    Pramipexole Nausea Only   Sulfa Antibiotics Other (See Comments)   hives   Tramadol         Medication List    albuterol 108 (90 Base) MCG/ACT inhaler Commonly known as: VENTOLIN HFA Inhale 2 puffs into the lungs every 6 (six) hours as needed for wheezing or shortness of breath.   budesonide 32 MCG/ACT nasal spray Commonly known as: RHINOCORT AQUA Place 1 spray into both nostrils daily. Reported on 02/04/2016   CALCIUM + D PO Take 1,200 mg by mouth daily.   cholecalciferol 25 MCG (1000 UNIT) tablet Commonly known as: VITAMIN D3 Take 1,000 Units by mouth in the morning, at noon, and at bedtime.   diclofenac Sodium 1 % Gel Commonly known as: VOLTAREN   DULoxetine 60 MG capsule Commonly known as: Cymbalta Take 1 capsule (60 mg total) by mouth 2 (  two) times daily.   famotidine 40 MG tablet Commonly known as: PEPCID TAKE 1 TABLET BY MOUTH EVERYDAY AT BEDTIME   Flovent HFA 110 MCG/ACT inhaler Generic drug: fluticasone Inhale two puffs twice daily to prevent cough or wheeze.  Rinse, gargle, and spit after use.   gabapentin 300 MG capsule Commonly known as: NEURONTIN Take 300 mg by mouth in the morning, at noon, and at bedtime.   HAIR/SKIN/NAILS PO Take by mouth daily.   ibuprofen 200 MG tablet Commonly known as: ADVIL Take 400 mg by mouth as  needed.   levothyroxine 50 MCG tablet Commonly known as: SYNTHROID TAKE 1 TABLET BY MOUTH DAILY   Magnesium 250 MG Tabs Take 250 mg by mouth daily.   multivitamin capsule Take 1 capsule by mouth daily.   pantoprazole 40 MG tablet Commonly known as: PROTONIX TAKE 1 TABLET BY MOUTH DAILY   valACYclovir 500 MG tablet Commonly known as: VALTREX Take 1 tablet (500 mg total) by mouth 3 (three) times daily as needed (herpes outbreak).     Past Medical History:  Diagnosis Date   Allergic rhinoconjunctivitis    Anxiety    Asthma    Depression    Diverticulitis    GERD (gastroesophageal reflux disease)    Glaucoma    Hypothyroid    Insomnia    Laryngopharyngeal reflux (LPR)    Migraines    Osteoarthritis    Psoriatic arthritis ([place])    Skin cancer    STD (sexually transmitted disease)    HSV II    Past Surgical History:  Procedure Laterality Date   BELPHAROPTOSIS REPAIR     BREAST SURGERY Left    times 2   COLONOSCOPY  06/03/2013   Moderate predominantly sigmoid diverticulosis. Small interal hemorroids   FOOT SURGERY Right    Removed bone spur   GANGLION CYST EXCISION Left    foot   HEMORRHOID SURGERY     SKIN CANCER EXCISION     TOTAL KNEE ARTHROPLASTY Right 07/21/2020   UPPER GI ENDOSCOPY  03/23/2016   Mild gastritis, gastric polyps bxbenign squamous mucosa with no abnormaility, fundic glad poly in setting of mild chron gastritis.    Review of systems negative except as noted in HPI / PMHx or noted below:  Review of Systems  Constitutional: Negative.   HENT: Negative.    Eyes: Negative.   Respiratory: Negative.    Cardiovascular: Negative.   Gastrointestinal: Negative.   Genitourinary: Negative.   Musculoskeletal: Negative.   Skin: Negative.   Neurological: Negative.   Endo/Heme/Allergies: Negative.   Psychiatric/Behavioral: Negative.      Objective:   Vitals:   01/05/22 1520  BP: 126/84  Pulse: 78  Resp: 16  SpO2: 99%          Physical  Exam Constitutional:      Appearance: She is not diaphoretic.  HENT:     Head: Normocephalic.     Right Ear: Tympanic membrane, ear canal and external ear normal.     Left Ear: Tympanic membrane, ear canal and external ear normal.     Nose: Nose normal. No mucosal edema or rhinorrhea.     Mouth/Throat:     Pharynx: Uvula midline. No oropharyngeal exudate.  Eyes:     Conjunctiva/sclera: Conjunctivae normal.  Neck:     Thyroid: No thyromegaly.     Trachea: Trachea normal. No tracheal tenderness or tracheal deviation.  Cardiovascular:     Rate and Rhythm: Normal rate and regular rhythm.  Heart sounds: Normal heart sounds, S1 normal and S2 normal. No murmur heard. Pulmonary:     Effort: No respiratory distress.     Breath sounds: Normal breath sounds. No stridor. No wheezing or rales.  Lymphadenopathy:     Head:     Right side of head: No tonsillar adenopathy.     Left side of head: No tonsillar adenopathy.     Cervical: No cervical adenopathy.  Skin:    Findings: No erythema or rash.     Nails: There is no clubbing.  Neurological:     Mental Status: She is alert.    Diagnostics:    Spirometry was performed and demonstrated an FEV1 of 2.06 at 106 % of predicted.  Assessment and Plan:   1. Not well controlled mild persistent asthma   2. Perennial allergic rhinitis   3. LPRD (laryngopharyngeal reflux disease)     1.  Treat and prevent inflammation:  A. Flovent 110 - 2 inhalations 2 times per day w/spacer (empty lungs) B. Budesonide - 2 sprays each nostril 1-7 times per week  2.  Treat and prevent reflux/LPR:  A. Pantoprazole 40 mg - 1 tablet 1 time per day B. Famotidine 40 mg - 1 tablet in evening  3.  If needed:  A. Nasal saline B. OTC Cetirizine 10 mg - 1 tablet 1 time per day C. Albuterol HFA - 2 inhalations every 4-6 hours  4.  Return in 12 weeks or earlier if problem.    Timberlynn will consistently use Flovent for lower airway and some budesonide for her  upper airway and consistently treat her LPR with the therapy noted above and I suspect that her cough will get under good control with this plan and then we can have her rheumatologist reinitiate the use of methotrexate and should she develop a cough thereafter we need to investigate for the possibility of a hypersensitivity disorder as a result of methotrexate administration.  I will see her back in this clinic in 12 weeks or earlier if there is a problem.  Allena Katz, MD Allergy / Immunology [place]

## 2022-01-06 ENCOUNTER — Encounter: Payer: Self-pay | Admitting: Allergy and Immunology

## 2022-01-24 DIAGNOSIS — R413 Other amnesia: Secondary | ICD-10-CM | POA: Diagnosis not present

## 2022-01-24 DIAGNOSIS — K219 Gastro-esophageal reflux disease without esophagitis: Secondary | ICD-10-CM | POA: Diagnosis not present

## 2022-01-24 DIAGNOSIS — R490 Dysphonia: Secondary | ICD-10-CM | POA: Diagnosis not present

## 2022-01-26 DIAGNOSIS — L4050 Arthropathic psoriasis, unspecified: Secondary | ICD-10-CM | POA: Diagnosis not present

## 2022-01-26 DIAGNOSIS — Z0000 Encounter for general adult medical examination without abnormal findings: Secondary | ICD-10-CM | POA: Diagnosis not present

## 2022-01-26 DIAGNOSIS — M4316 Spondylolisthesis, lumbar region: Secondary | ICD-10-CM | POA: Diagnosis not present

## 2022-01-26 DIAGNOSIS — Z23 Encounter for immunization: Secondary | ICD-10-CM | POA: Diagnosis not present

## 2022-01-26 DIAGNOSIS — K219 Gastro-esophageal reflux disease without esophagitis: Secondary | ICD-10-CM | POA: Diagnosis not present

## 2022-01-26 DIAGNOSIS — M8588 Other specified disorders of bone density and structure, other site: Secondary | ICD-10-CM | POA: Diagnosis not present

## 2022-01-26 DIAGNOSIS — E039 Hypothyroidism, unspecified: Secondary | ICD-10-CM | POA: Diagnosis not present

## 2022-01-26 DIAGNOSIS — G2581 Restless legs syndrome: Secondary | ICD-10-CM | POA: Diagnosis not present

## 2022-01-26 DIAGNOSIS — E7800 Pure hypercholesterolemia, unspecified: Secondary | ICD-10-CM | POA: Diagnosis not present

## 2022-01-30 NOTE — Progress Notes (Signed)
Cancelled.  

## 2022-01-31 ENCOUNTER — Encounter: Payer: Self-pay | Admitting: Physician Assistant

## 2022-02-08 ENCOUNTER — Ambulatory Visit: Payer: Medicare Other | Admitting: Physician Assistant

## 2022-03-09 ENCOUNTER — Ambulatory Visit: Payer: Medicare Other | Admitting: Physician Assistant

## 2022-03-09 DIAGNOSIS — L814 Other melanin hyperpigmentation: Secondary | ICD-10-CM | POA: Diagnosis not present

## 2022-03-09 DIAGNOSIS — D225 Melanocytic nevi of trunk: Secondary | ICD-10-CM | POA: Diagnosis not present

## 2022-03-09 DIAGNOSIS — L820 Inflamed seborrheic keratosis: Secondary | ICD-10-CM | POA: Diagnosis not present

## 2022-03-09 DIAGNOSIS — D2239 Melanocytic nevi of other parts of face: Secondary | ICD-10-CM | POA: Diagnosis not present

## 2022-03-09 DIAGNOSIS — L301 Dyshidrosis [pompholyx]: Secondary | ICD-10-CM | POA: Diagnosis not present

## 2022-03-14 DIAGNOSIS — Z96651 Presence of right artificial knee joint: Secondary | ICD-10-CM | POA: Diagnosis not present

## 2022-03-14 DIAGNOSIS — T8484XA Pain due to internal orthopedic prosthetic devices, implants and grafts, initial encounter: Secondary | ICD-10-CM | POA: Diagnosis not present

## 2022-03-24 ENCOUNTER — Ambulatory Visit: Payer: Medicare Other | Admitting: Family Medicine

## 2022-03-30 ENCOUNTER — Ambulatory Visit: Payer: Medicare Other | Admitting: Allergy and Immunology

## 2022-03-30 DIAGNOSIS — M4316 Spondylolisthesis, lumbar region: Secondary | ICD-10-CM | POA: Diagnosis not present

## 2022-03-31 DIAGNOSIS — Z79631 Long term (current) use of antimetabolite agent: Secondary | ICD-10-CM | POA: Diagnosis not present

## 2022-03-31 DIAGNOSIS — R519 Headache, unspecified: Secondary | ICD-10-CM | POA: Diagnosis not present

## 2022-03-31 DIAGNOSIS — Z8719 Personal history of other diseases of the digestive system: Secondary | ICD-10-CM | POA: Diagnosis not present

## 2022-03-31 DIAGNOSIS — H04123 Dry eye syndrome of bilateral lacrimal glands: Secondary | ICD-10-CM | POA: Diagnosis not present

## 2022-03-31 DIAGNOSIS — Z882 Allergy status to sulfonamides status: Secondary | ICD-10-CM | POA: Diagnosis not present

## 2022-03-31 DIAGNOSIS — R413 Other amnesia: Secondary | ICD-10-CM | POA: Diagnosis not present

## 2022-03-31 DIAGNOSIS — J45909 Unspecified asthma, uncomplicated: Secondary | ICD-10-CM | POA: Diagnosis not present

## 2022-03-31 DIAGNOSIS — M25569 Pain in unspecified knee: Secondary | ICD-10-CM | POA: Diagnosis not present

## 2022-03-31 DIAGNOSIS — M1711 Unilateral primary osteoarthritis, right knee: Secondary | ICD-10-CM | POA: Diagnosis not present

## 2022-03-31 DIAGNOSIS — Z96651 Presence of right artificial knee joint: Secondary | ICD-10-CM | POA: Diagnosis not present

## 2022-03-31 DIAGNOSIS — J0191 Acute recurrent sinusitis, unspecified: Secondary | ICD-10-CM | POA: Diagnosis not present

## 2022-03-31 DIAGNOSIS — R490 Dysphonia: Secondary | ICD-10-CM | POA: Diagnosis not present

## 2022-03-31 DIAGNOSIS — R29898 Other symptoms and signs involving the musculoskeletal system: Secondary | ICD-10-CM | POA: Diagnosis not present

## 2022-03-31 DIAGNOSIS — L4050 Arthropathic psoriasis, unspecified: Secondary | ICD-10-CM | POA: Diagnosis not present

## 2022-03-31 DIAGNOSIS — Z7952 Long term (current) use of systemic steroids: Secondary | ICD-10-CM | POA: Diagnosis not present

## 2022-03-31 DIAGNOSIS — Z79899 Other long term (current) drug therapy: Secondary | ICD-10-CM | POA: Diagnosis not present

## 2022-03-31 DIAGNOSIS — Z7960 Long term (current) use of unspecified immunomodulators and immunosuppressants: Secondary | ICD-10-CM | POA: Diagnosis not present

## 2022-03-31 DIAGNOSIS — R251 Tremor, unspecified: Secondary | ICD-10-CM | POA: Diagnosis not present

## 2022-03-31 DIAGNOSIS — N390 Urinary tract infection, site not specified: Secondary | ICD-10-CM | POA: Diagnosis not present

## 2022-03-31 DIAGNOSIS — Z885 Allergy status to narcotic agent status: Secondary | ICD-10-CM | POA: Diagnosis not present

## 2022-03-31 DIAGNOSIS — M25561 Pain in right knee: Secondary | ICD-10-CM | POA: Diagnosis not present

## 2022-03-31 DIAGNOSIS — Z85828 Personal history of other malignant neoplasm of skin: Secondary | ICD-10-CM | POA: Diagnosis not present

## 2022-03-31 DIAGNOSIS — Z91048 Other nonmedicinal substance allergy status: Secondary | ICD-10-CM | POA: Diagnosis not present

## 2022-03-31 DIAGNOSIS — Z8669 Personal history of other diseases of the nervous system and sense organs: Secondary | ICD-10-CM | POA: Diagnosis not present

## 2022-03-31 DIAGNOSIS — M898X4 Other specified disorders of bone, hand: Secondary | ICD-10-CM | POA: Diagnosis not present

## 2022-04-04 ENCOUNTER — Encounter: Payer: Self-pay | Admitting: Physician Assistant

## 2022-04-04 ENCOUNTER — Other Ambulatory Visit (INDEPENDENT_AMBULATORY_CARE_PROVIDER_SITE_OTHER): Payer: Medicare Other

## 2022-04-04 ENCOUNTER — Ambulatory Visit: Payer: Medicare Other | Admitting: Physician Assistant

## 2022-04-04 VITALS — BP 132/85 | HR 90 | Resp 20 | Ht 61.5 in | Wt 153.0 lb

## 2022-04-04 DIAGNOSIS — R413 Other amnesia: Secondary | ICD-10-CM | POA: Diagnosis not present

## 2022-04-04 DIAGNOSIS — M1712 Unilateral primary osteoarthritis, left knee: Secondary | ICD-10-CM | POA: Diagnosis not present

## 2022-04-04 LAB — TSH: TSH: 1.98 u[IU]/mL (ref 0.35–5.50)

## 2022-04-04 LAB — VITAMIN B12: Vitamin B-12: 643 pg/mL (ref 211–911)

## 2022-04-04 NOTE — Progress Notes (Addendum)
Assessment/Plan:   Olivia Cruz is a very pleasant 71 y.o. year old RH female with  a history of psoriatic arthritis on Humira, hypothyroidism, RLS, asthma, hyperlipidemia, depression, glaucoma, laryngopharyngeal reflux, migraines, osteoarthritis, seen today for evaluation of memory loss.  Patient reports "a bloc of time that she did not remember ", with possible differentials being TGA versus TIA versus absence seizure's, and others.  MoCA today is 26/30, with delayed recall 4/5, and visual-spatial executive 4/5.  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  She is undergoing significant amount of stress with her children, high stress needs to be considered among the differentials.   Memory difficulties  MRI brain without contrast to assess for underlying structural abnormality and assess vascular load  Check EEG rule out seizures Check B12, TSH  Follow-up in 1 month  Subjective:    The patient is seen in neurologic consultation at the request of Donald Prose, MD for the evaluation of memory.  The patient is accompanied by her husband who supplements the history.   How long did patient have memory difficulties?  Since her hospitalization in March 2023 for fatigue and weakness, she noticed his trouble finding words.  She reported that her rheumatologist was concerned about the "vascular event ".  At the time, she did not have any vascular studies.  She states that she felt like being on a "brain fog ", but she continued to struggle even after her hospitalization.  She reports that back in 2017 she had a similar event while at work, when she did not recognize the road she was driving or how to perform her job (that she has been down for a long time).  Sometimes, she has difficulty finding the right word to say or mixes up the order of words.  For example, she would say "putting the car in the paper" rather than "putting the paper in the car  ".  This happens at times since her when writing out words even when she knows how to correctly spell it.  She could be  in the middle of a task and she forgets what she is doing.  She does admit that she is stressed, this may be exacerbated. Patient lives with: Spouse who noticed changes as well.   repeats oneself? Endorsed.  Husband tries not to tell her she repeats and asks same questions because he does not want to add to the stress. Disoriented when walking into a room?  Patient denies   Leaving objects in unusual places?  In the recent past she tried to put the milk in the cabinet. Ambulates  with difficulty?   Patient denies .  She had HHN after 3/23 needed PT/OT for 3 weeks because she was having some issues with instability, now walks well. Started water aerobics  Recent falls?  3 weeks ago, cannot remember how she fell.  Any head injuries?  3 weeks ago hit her head at the kitchen wall. Maybe for a few seconds she had LOC.  History of seizures?   Patient denies   Wandering behavior?  Patient denies   Patient drives?    She forgot how to get here today. Has trouble with directions  Any mood changes ?  Patient denies   Any history of depression?:  Endorsed, since her knee replacement because of constant pain, and some back issues.  Hallucinations?  Patient denies   Paranoia?  Patient denies   Patient  reports that she sleeps well without vivid dreams, REM behavior or sleepwalking   It takes her a while to fall asleep.  Any hygiene concerns?  Patient denies   Independent of bathing and dressing?  Endorsed  Does the patient needs help with medications? Occasionally she forgets her after breakfast meds. Who is in charge of the finances?  Patient is in charge of the bill payments , other financial issues are done together   Any changes in appetite? Her appetite is good now just yesterday did not eat, does not drink enough water.  Patient have trouble swallowing? Patient denies   Does the  patient cook?  Patient denies   Any kitchen accidents such as leaving the stove on? Patient denies   Any headaches? History of migraines in the 90s, went away till this year "flash headaches" (neg for psoriatic arthritis complications).  The double vision? Patient denies   Any focal numbness or tingling?  Patient denies   Chronic back pain Patient denies   Unilateral weakness?  Patient denies   Any tremors?  Patient denies   Any history of anosmia?  Patient denies   Any incontinence of urine?  Patient denies   Any bowel dysfunction?   Patient denies     History of heavy alcohol intake?  Patient denies   History of heavy tobacco use?  Patient denies   Family history of dementia?  Father had Parkinson's and dementia.    Pertinent labs June 2023 total cholesterol 254, triglycerides 213, LDL 159, TSH 2.69 Past Medical History:  Diagnosis Date   Allergic rhinoconjunctivitis    Anxiety    Asthma    Depression    Diverticulitis    GERD (gastroesophageal reflux disease)    Glaucoma    Hypothyroid    Insomnia    Laryngopharyngeal reflux (LPR)    Migraines    Osteoarthritis    Psoriatic arthritis ([place])    Skin cancer    STD (sexually transmitted disease)    HSV II     Past Surgical History:  Procedure Laterality Date   BELPHAROPTOSIS REPAIR     BREAST SURGERY Left    times 2   COLONOSCOPY  06/03/2013   Moderate predominantly sigmoid diverticulosis. Small interal hemorroids   FOOT SURGERY Right    Removed bone spur   GANGLION CYST EXCISION Left    foot   HEMORRHOID SURGERY     SKIN CANCER EXCISION     TOTAL KNEE ARTHROPLASTY Right 07/21/2020   UPPER GI ENDOSCOPY  03/23/2016   Mild gastritis, gastric polyps bxbenign squamous mucosa with no abnormaility, fundic glad poly in setting of mild chron gastritis.     Allergies  Allergen Reactions   Codeine Anaphylaxis and Nausea Only   Ambien [Zolpidem Tartrate] Other (See Comments)    Memory loss per patient   Cyproheptadine  Hcl Other (See Comments)    Confusion   Dilaudid [Hydromorphone]     hallucinations   Hydrocodone Other (See Comments)    anaphylaxis   Lisinopril    Pramipexole Nausea Only   Sulfa Antibiotics Other (See Comments)    hives   Tramadol     Current Outpatient Medications  Medication Instructions   albuterol (VENTOLIN HFA) 108 (90 Base) MCG/ACT inhaler 2 puffs, Inhalation, Every 6 hours PRN   Biotin w/ Vitamins C & E (HAIR/SKIN/NAILS PO) Oral, Daily   budesonide (RHINOCORT AQUA) 32 MCG/ACT nasal spray 1 spray, Each Nare, Daily, Reported on 02/04/2016    Calcium Carbonate-Vitamin  D (CALCIUM + D PO) 1,200 mg, Daily   cholecalciferol (VITAMIN D3) 1,000 Units, Oral, 3 times daily   diclofenac Sodium (VOLTAREN) 1 % GEL No dose, route, or frequency recorded.   DULoxetine (CYMBALTA) 60 mg, Oral, 2 times daily   famotidine (PEPCID) 40 MG tablet TAKE 1 TABLET BY MOUTH EVERYDAY AT BEDTIME   FLOVENT HFA 110 MCG/ACT inhaler Inhale two puffs twice daily to prevent cough or wheeze.  Rinse, gargle, and spit after use.   fluconazole (DIFLUCAN) 200 MG tablet Oral   gabapentin (NEURONTIN) 300 mg, Oral, 3 times daily   ibuprofen (ADVIL) 400 mg, Oral, As needed   levothyroxine (SYNTHROID) 50 MCG tablet TAKE 1 TABLET BY MOUTH DAILY   Magnesium 250 mg, Oral, Daily   Meloxicam 7.5 MG TBDP No dose, route, or frequency recorded.   Multiple Vitamin (MULTIVITAMIN) capsule 1 capsule, Daily   pantoprazole (PROTONIX) 40 MG tablet TAKE 1 TABLET BY MOUTH DAILY   valACYclovir (VALTREX) 500 mg, Oral, 3 times daily PRN     VITALS:   Vitals:   04/04/22 0926  BP: 132/85  Pulse: 90  Resp: 20  SpO2: 97%  Weight: 153 lb (69.4 kg)  Height: 5' 1.5" (1.562 m)    PHYSICAL EXAM   HEENT:  Normocephalic, atraumatic. The mucous membranes are moist. The superficial temporal arteries are without ropiness or tenderness. Cardiovascular: Regular rate and rhythm. Lungs: Clear to auscultation bilaterally. Neck: There are  no carotid bruits noted bilaterally.  NEUROLOGICAL:    04/04/2022    9:00 AM  Montreal Cognitive Assessment   Visuospatial/ Executive (0/5) 4  Naming (0/3) 3  Attention: Read list of digits (0/2) 2  Attention: Read list of letters (0/1) 1  Attention: Serial 7 subtraction starting at 100 (0/3) 2  Language: Repeat phrase (0/2) 2  Language : Fluency (0/1) 1  Abstraction (0/2) 2  Delayed Recall (0/5) 4  Orientation (0/6) 5  Total 26  Adjusted Score (based on education) 26        No data to display           Orientation:  Alert and oriented to person, place and time. No aphasia or dysarthria. Fund of knowledge is appropriate. Recent memory impaired and remote memory intact.  Attention and concentration are normal.  Able to name objects and repeat phrases. Delayed recall 4/5.  Fluency intact. Cranial nerves: There is good facial symmetry. Extraocular muscles are intact and visual fields are full to confrontational testing. Speech is fluent and clear. Soft palate rises symmetrically and there is no tongue deviation. Hearing is intact to conversational tone. Tone: Tone is good throughout. Sensation: Sensation is intact to light touch and pinprick throughout. Vibration is intact at the bilateral big toe.There is no extinction with double simultaneous stimulation. There is no sensory dermatomal level identified. Coordination: The patient has no difficulty with RAM's or FNF bilaterally. Normal finger to nose  Motor: Strength is 5/5 in the bilateral upper and lower extremities. There is no pronator drift. There are no fasciculations noted. DTR's: Deep tendon reflexes are 2/4 at the bilateral biceps, triceps, brachioradialis, patella and achilles.  Plantar responses are downgoing bilaterally. Gait and Station: The patient is able to ambulate without difficulty.The patient is able to heel toe walk without any difficulty.The patient is able to ambulate in a tandem fashion. The patient is able to  stand in the Romberg position.     Thank you for allowing [place] the opportunity to participate in the care of  this nice patient. Please do not hesitate to contact us for any questions or concerns.   Total time spent on today's visit was 55 minutes dedicated to this patient today, preparing to see patient, examining the patient, ordering tests and/or medications and counseling the patient, documenting clinical information in the EHR or other health record, independently interpreting results and communicating results to the patient/family, discussing treatment and goals, answering patient's questions and coordinating care.  Cc:  Donald Prose, MD  Sharene Butters 04/04/2022 10:54 AM

## 2022-04-04 NOTE — Patient Instructions (Addendum)
It was a pleasure to see you today at our office.   Recommendations:  MRI of the brain, the radiology office will call you to arrange you appointment EEG Check labs today CBT recommended Follow up in 1 month  Whom to call:  Memory  decline, memory medications: Call our office [phone]   For psychiatric meds, mood meds: Please have your primary care physician manage these medications.   Counseling regarding caregiver distress, including caregiver depression, anxiety and issues regarding community resources, adult day care programs, adult living facilities, or memory care questions:   Feel free to contact [place], Social Worker at [phone]   If you have any severe symptoms of a stroke, or other severe issues such as confusion,severe chills or fever, etc call 911 or go to the ER as you may need to be evaluated further      RECOMMENDATIONS FOR ALL PATIENTS WITH MEMORY PROBLEMS: 1. Continue to exercise (Recommend 30 minutes of walking everyday, or 3 hours every week) 2. Increase social interactions - continue going to [place] and enjoy social gatherings with friends and family 3. Eat healthy, avoid fried foods and eat more fruits and vegetables 4. Maintain adequate blood pressure, blood sugar, and blood cholesterol level. Reducing the risk of stroke and cardiovascular disease also helps promoting better memory. 5. Avoid stressful situations. Live a simple life and avoid aggravations. Organize your time and prepare for the next day in anticipation. 6. Sleep well, avoid any interruptions of sleep and avoid any distractions in the bedroom that may interfere with adequate sleep quality 7. Avoid sugar, avoid sweets as there is a strong link between excessive sugar intake, diabetes, and cognitive impairment We discussed the Mediterranean diet, which has been shown to help patients reduce the risk of progressive memory disorders and reduces cardiovascular risk. This includes  eating fish, eat fruits and green leafy vegetables, nuts like almonds and hazelnuts, walnuts, and also use olive oil. Avoid fast foods and fried foods as much as possible. Avoid sweets and sugar as sugar use has been linked to worsening of memory function.  There is always a concern of gradual progression of memory problems. If this is the case, then we may need to adjust level of care according to patient needs. Support, both to the patient and caregiver, should then be put into place.      FALL PRECAUTIONS: Be cautious when walking. Scan the area for obstacles that may increase the risk of trips and falls. When getting up in the mornings, sit up at the edge of the bed for a few minutes before getting out of bed. Consider elevating the bed at the head end to avoid drop of blood pressure when getting up. Walk always in a well-lit room (use night lights in the walls). Avoid area rugs or power cords from appliances in the middle of the walkways. Use a walker or a cane if necessary and consider physical therapy for balance exercise. Get your eyesight checked regularly.  FINANCIAL OVERSIGHT: Supervision, especially oversight when making financial decisions or transactions is also recommended.  HOME SAFETY: Consider the safety of the kitchen when operating appliances like stoves, microwave oven, and blender. Consider having supervision and share cooking responsibilities until no longer able to participate in those. Accidents with firearms and other hazards in the house should be identified and addressed as well.   ABILITY TO BE LEFT ALONE: If patient is unable to contact 911 operator, consider using [facility], or when the need is  there, arrange for someone to stay with patients. Smoking is a fire hazard, consider supervision or cessation. Risk of wandering should be assessed by caregiver and if detected at any point, supervision and safe proof recommendations should be instituted.  MEDICATION SUPERVISION:  Inability to self-administer medication needs to be constantly addressed. Implement a mechanism to ensure safe administration of the medications.   DRIVING: Regarding driving, in patients with progressive memory problems, driving will be impaired. We advise to have someone else do the driving if trouble finding directions or if minor accidents are reported. Independent driving assessment is available to determine safety of driving.   If you are interested in the driving assessment, you can contact the following:  [employer] in [place]  [place] [phone] or [phone]    [place] refers to food and lifestyle choices that are based on the traditions of countries located on the [employer]. This way of eating has been shown to help prevent certain conditions and improve outcomes for people who have chronic diseases, like kidney disease and heart disease. What are tips for following this plan? Lifestyle  Cook and eat meals together with your family, when possible. Drink enough fluid to keep your urine clear or pale yellow. Be physically active every day. This includes: Aerobic exercise like running or swimming. Leisure activities like gardening, walking, or housework. Get 7-8 hours of sleep each night. If recommended by your health care provider, drink red wine in moderation. This means 1 glass a day for nonpregnant women and 2 glasses a day for men. A glass of wine equals 5 oz (150 mL). Reading food labels  Check the serving size of packaged foods. For foods such as rice and pasta, the serving size refers to the amount of cooked product, not dry. Check the total fat in packaged foods. Avoid foods that have saturated fat or trans fats. Check the ingredients list for added sugars, such as corn syrup. Shopping  At the grocery  store, buy most of your food from the areas near the walls of the store. This includes: Fresh fruits and vegetables (produce). Grains, beans, nuts, and seeds. Some of these may be available in unpackaged forms or large amounts (in bulk). Fresh seafood. Poultry and eggs. Low-fat dairy products. Buy whole ingredients instead of prepackaged foods. Buy fresh fruits and vegetables in-season from local farmers markets. Buy frozen fruits and vegetables in resealable bags. If you do not have access to quality fresh seafood, buy precooked frozen shrimp or canned fish, such as tuna, salmon, or sardines. Buy small amounts of raw or cooked vegetables, salads, or olives from the deli or salad bar at your store. Stock your pantry so you always have certain foods on hand, such as olive oil, canned tuna, canned tomatoes, rice, pasta, and beans. Cooking  Cook foods with extra-virgin olive oil instead of using butter or other vegetable oils. Have meat as a side dish, and have vegetables or grains as your main dish. This means having meat in small portions or adding small amounts of meat to foods like pasta or stew. Use beans or vegetables instead of meat in common dishes like chili or lasagna. Experiment with different cooking methods. Try roasting or broiling vegetables instead of steaming or sauteing them. Add frozen vegetables to soups, stews, pasta, or rice. Add nuts or seeds for added healthy fat at each meal. You can add these  to yogurt, salads, or vegetable dishes. Marinate fish or vegetables using olive oil, lemon juice, garlic, and fresh herbs. Meal planning  Plan to eat 1 vegetarian meal one day each week. Try to work up to 2 vegetarian meals, if possible. Eat seafood 2 or more times a week. Have healthy snacks readily available, such as: Vegetable sticks with hummus. Greek yogurt. Fruit and nut trail mix. Eat balanced meals throughout the week. This includes: Fruit: 2-3 servings a  day Vegetables: 4-5 servings a day Low-fat dairy: 2 servings a day Fish, poultry, or lean meat: 1 serving a day Beans and legumes: 2 or more servings a week Nuts and seeds: 1-2 servings a day Whole grains: 6-8 servings a day Extra-virgin olive oil: 3-4 servings a day Limit red meat and sweets to only a few servings a month What are my food choices? Mediterranean diet Recommended Grains: Whole-grain pasta. Brown rice. Bulgar wheat. Polenta. Couscous. Whole-wheat bread. [place]. Vegetables: Artichokes. Beets. Broccoli. Cabbage. Carrots. Eggplant. Green beans. Chard. Kale. Spinach. Onions. Leeks. Peas. Squash. Tomatoes. Peppers. Radishes. Fruits: Apples. Apricots. Avocado. Berries. Bananas. Cherries. Dates. Figs. Grapes. Lemons. Melon. Oranges. Peaches. Plums. Pomegranate. Meats and other protein foods: Beans. Almonds. Sunflower seeds. Pine nuts. Peanuts. [place]. Salmon. Scallops. Shrimp. [place]. Tilapia. Clams. Oysters. Eggs. Dairy: Low-fat milk. Cheese. Greek yogurt. Beverages: Water. Red wine. Herbal tea. Fats and oils: Extra virgin olive oil. Avocado oil. Grape seed oil. Sweets and desserts: [place] yogurt with honey. Baked apples. Poached pears. Trail mix. Seasoning and other foods: Basil. Cilantro. Coriander. Cumin. Mint. Parsley. Sage. Rosemary. Tarragon. Garlic. Oregano. Thyme. Pepper. Balsalmic vinegar. Tahini. Hummus. Tomato sauce. Olives. Mushrooms. Limit these Grains: Prepackaged pasta or rice dishes. Prepackaged cereal with added sugar. Vegetables: Deep fried potatoes (french fries). Fruits: Fruit canned in syrup. Meats and other protein foods: Beef. Pork. Lamb. Poultry with skin. Hot dogs. [place] Salines. Dairy: Ice cream. Sour cream. Whole milk. Beverages: Juice. Sugar-sweetened soft drinks. Beer. Liquor and spirits. Fats and oils: Butter. Canola oil. Vegetable oil. Beef fat (tallow). Lard. Sweets and desserts: Cookies. Cakes. Pies. Candy. Seasoning and other foods: Mayonnaise.  Premade sauces and marinades. The items listed may not be a complete list. Talk with your dietitian about what dietary choices are right for you. Summary The Mediterranean diet includes both food and lifestyle choices. Eat a variety of fresh fruits and vegetables, beans, nuts, seeds, and whole grains. Limit the amount of red meat and sweets that you eat. Talk with your health care provider about whether it is safe for you to drink red wine in moderation. This means 1 glass a day for nonpregnant women and 2 glasses a day for men. A glass of wine equals 5 oz (150 mL). This information is not intended to replace advice given to you by your health care provider. Make sure you discuss any questions you have with your health care provider. Document Released: 03/31/2016 Document Revised: 05/03/2016 Document Reviewed: 03/31/2016 Elsevier Interactive Patient Education  2017 [employer].   We have sent a referral to [facility] for your MRI and they will call you directly to schedule your appointment. They are located at [place]. If you need to contact them directly please call [phone].  Your provider has requested that you have labwork completed today. Please go to [facility] ([id number]) on the second floor of this building before leaving the office today. You do not need to check in. If you are not called within 15 minutes please check with  the front desk.

## 2022-04-05 NOTE — Progress Notes (Signed)
Mychart message sent.

## 2022-04-08 ENCOUNTER — Telehealth: Payer: Self-pay | Admitting: Physician Assistant

## 2022-04-08 ENCOUNTER — Other Ambulatory Visit: Payer: Self-pay | Admitting: Physician Assistant

## 2022-04-08 MED ORDER — DIAZEPAM 5 MG PO TABS: ORAL_TABLET | ORAL | 0 refills | Status: DC

## 2022-04-08 NOTE — Telephone Encounter (Signed)
Patient advised rx called in patient advised.

## 2022-04-08 NOTE — Telephone Encounter (Signed)
The following message was left with [facility] on 04/08/22 at 12:33 PM.  Caller states she has an MRI scheduled. She said she is afraid of MRI machines and she needs something called in for her.  [facility] on [address] in [place]

## 2022-04-11 DIAGNOSIS — R5383 Other fatigue: Secondary | ICD-10-CM | POA: Diagnosis not present

## 2022-04-11 DIAGNOSIS — R059 Cough, unspecified: Secondary | ICD-10-CM | POA: Diagnosis not present

## 2022-04-11 DIAGNOSIS — R5382 Chronic fatigue, unspecified: Secondary | ICD-10-CM | POA: Diagnosis not present

## 2022-04-11 DIAGNOSIS — R079 Chest pain, unspecified: Secondary | ICD-10-CM | POA: Diagnosis not present

## 2022-04-11 DIAGNOSIS — R42 Dizziness and giddiness: Secondary | ICD-10-CM | POA: Diagnosis not present

## 2022-04-13 DIAGNOSIS — R062 Wheezing: Secondary | ICD-10-CM | POA: Diagnosis not present

## 2022-04-13 DIAGNOSIS — R0600 Dyspnea, unspecified: Secondary | ICD-10-CM | POA: Diagnosis not present

## 2022-04-13 DIAGNOSIS — Z13820 Encounter for screening for osteoporosis: Secondary | ICD-10-CM | POA: Diagnosis not present

## 2022-04-14 ENCOUNTER — Telehealth: Payer: Medicare Other

## 2022-04-16 ENCOUNTER — Ambulatory Visit
Admission: RE | Admit: 2022-04-16 | Discharge: 2022-04-16 | Disposition: A | Discharge status: Home / Self Care | Payer: Medicare Other | Source: Ambulatory Visit | Attending: Physician Assistant | Admitting: Physician Assistant

## 2022-04-16 DIAGNOSIS — R413 Other amnesia: Secondary | ICD-10-CM | POA: Diagnosis not present

## 2022-04-18 ENCOUNTER — Encounter: Payer: Self-pay | Admitting: Physician Assistant

## 2022-04-20 NOTE — Progress Notes (Signed)
MRI brain does not show any acute findings. No volume loss or any signs of stroke, masses or bleeding. thanks

## 2022-04-21 ENCOUNTER — Ambulatory Visit: Payer: Medicare Other | Admitting: Allergy and Immunology

## 2022-04-21 ENCOUNTER — Encounter: Payer: Self-pay | Admitting: Allergy and Immunology

## 2022-04-21 VITALS — BP 140/82 | HR 84 | Resp 12

## 2022-04-21 DIAGNOSIS — D849 Immunodeficiency, unspecified: Secondary | ICD-10-CM

## 2022-04-21 DIAGNOSIS — J4530 Mild persistent asthma, uncomplicated: Secondary | ICD-10-CM

## 2022-04-21 DIAGNOSIS — K219 Gastro-esophageal reflux disease without esophagitis: Secondary | ICD-10-CM | POA: Diagnosis not present

## 2022-04-21 DIAGNOSIS — J3089 Other allergic rhinitis: Secondary | ICD-10-CM

## 2022-04-21 NOTE — Progress Notes (Signed)
[facility] [place] [facility] [place]   Follow-up Note  Referring Provider: Donald Prose, MD Primary Provider: Donald Prose, MD Date of Office Visit: 04/21/2022  Subjective:   Olivia Cruz (DOB: [birth year]) is a 71 y.o. female who returns to the [facility] [facility] on 04/21/2022 in re-evaluation of the following:  HPI: Shatona returns to this clinic in evaluation of asthma, allergic rhinitis, LPR, in the context of immunosuppression with Humira for rheumatoid arthritis.  I last saw her in this clinic on 05 Jan 2022.  Overall she is doing well and has not required a systemic steroid or an antibiotic for any type of airway issue.  She still continues to have her intermittent dry cough and some throat clearing.  She has been using Flovent about 3 times per week for if she uses it it any more commonly then she develops laryngitis.  She continues to use her therapy for reflux which she thinks is going quite well.  She has had very little problems with her nose and is using nasal budesonide on a rare occasion.  She is no longer going to use any methotrexate.  Apparently there was a question of whether methotrexate was giving rise to a cough.  She did have a high-resolution chest CT scan a few weeks ago which did not identify any interstitial lung disease.  She will continue with Humira as directed by her rheumatologist for her rheumatoid arthritis.  Allergies as of 04/21/2022       Reactions   Codeine Anaphylaxis, Nausea Only   Ambien [zolpidem Tartrate] Other (See Comments)   Memory loss per patient   Cyproheptadine Hcl Other (See Comments)   Confusion   Dilaudid [hydromorphone]    hallucinations   Hydrocodone Other (See Comments)   anaphylaxis   Lisinopril    Pramipexole Nausea Only   Sulfa Antibiotics Other (See Comments)   hives   Tramadol         Medication List    Adalimumab 40 MG/0.4ML Pnkt Inject into the skin.   albuterol 108 (90 Base)  MCG/ACT inhaler Commonly known as: VENTOLIN HFA Inhale 2 puffs into the lungs every 6 (six) hours as needed for wheezing or shortness of breath.   atorvastatin 10 MG tablet Commonly known as: LIPITOR Take 10 mg by mouth daily.   budesonide 32 MCG/ACT nasal spray Commonly known as: RHINOCORT AQUA Place 1 spray into both nostrils daily. Reported on 02/04/2016   CALCIUM + D PO Take 1,200 mg by mouth daily.   cholecalciferol 25 MCG (1000 UNIT) tablet Commonly known as: VITAMIN D3 Take 1,000 Units by mouth in the morning, at noon, and at bedtime.   diclofenac Sodium 1 % Gel Commonly known as: VOLTAREN   DULoxetine 60 MG capsule Commonly known as: Cymbalta Take 1 capsule (60 mg total) by mouth 2 (two) times daily.   famotidine 40 MG tablet Commonly known as: PEPCID TAKE 1 TABLET BY MOUTH EVERYDAY AT BEDTIME   Flovent HFA 110 MCG/ACT inhaler Generic drug: fluticasone Inhale two puffs twice daily to prevent cough or wheeze.  Rinse, gargle, and spit after use.   fluconazole 200 MG tablet Commonly known as: DIFLUCAN Take by mouth.   folic acid 1 MG tablet Commonly known as: FOLVITE Take 1 mg by mouth daily.   gabapentin 300 MG capsule Commonly known as: NEURONTIN Take 300 mg by mouth in the morning, at noon, and at bedtime.   HAIR/SKIN/NAILS PO Take by mouth  daily.   ibuprofen 200 MG tablet Commonly known as: ADVIL Take 400 mg by mouth as needed.   levothyroxine 50 MCG tablet Commonly known as: SYNTHROID TAKE 1 TABLET BY MOUTH DAILY   Magnesium 250 MG Tabs Take 250 mg by mouth daily.   meloxicam 7.5 MG tablet Commonly known as: MOBIC Take 7.5 mg by mouth daily.   multivitamin capsule Take 1 capsule by mouth daily.   pantoprazole 40 MG tablet Commonly known as: PROTONIX TAKE 1 TABLET BY MOUTH DAILY   valACYclovir 500 MG tablet Commonly known as: VALTREX Take 1 tablet (500 mg total) by mouth 3 (three) times daily as needed (herpes outbreak).    Past  Medical History:  Diagnosis Date   Allergic rhinoconjunctivitis    Anxiety    Asthma    Depression    Diverticulitis    GERD (gastroesophageal reflux disease)    Glaucoma    Hypothyroid    Insomnia    Laryngopharyngeal reflux (LPR)    Migraines    Osteoarthritis    Psoriatic arthritis ([place])    Skin cancer    STD (sexually transmitted disease)    HSV II    Past Surgical History:  Procedure Laterality Date   BELPHAROPTOSIS REPAIR     BREAST SURGERY Left    times 2   COLONOSCOPY  06/03/2013   Moderate predominantly sigmoid diverticulosis. Small interal hemorroids   FOOT SURGERY Right    Removed bone spur   GANGLION CYST EXCISION Left    foot   HEMORRHOID SURGERY     SKIN CANCER EXCISION     TOTAL KNEE ARTHROPLASTY Right 07/21/2020   UPPER GI ENDOSCOPY  03/23/2016   Mild gastritis, gastric polyps bxbenign squamous mucosa with no abnormaility, fundic glad poly in setting of mild chron gastritis.    Review of systems negative except as noted in HPI / PMHx or noted below:  Review of Systems  Constitutional: Negative.   HENT: Negative.    Eyes: Negative.   Respiratory: Negative.    Cardiovascular: Negative.   Gastrointestinal: Negative.   Genitourinary: Negative.   Musculoskeletal: Negative.   Skin: Negative.   Neurological: Negative.   Endo/Heme/Allergies: Negative.   Psychiatric/Behavioral: Negative.       Objective:   Vitals:   04/21/22 1051  BP: (!) 140/82  Pulse: 84  Resp: 12  SpO2: 96%          Physical Exam Constitutional:      Appearance: She is not diaphoretic.  HENT:     Head: Normocephalic.     Right Ear: Tympanic membrane, ear canal and external ear normal.     Left Ear: Tympanic membrane, ear canal and external ear normal.     Nose: Nose normal. No mucosal edema or rhinorrhea.     Mouth/Throat:     Pharynx: Uvula midline. No oropharyngeal exudate.  Eyes:     Conjunctiva/sclera: Conjunctivae normal.  Neck:     Thyroid: No  thyromegaly.     Trachea: Trachea normal. No tracheal tenderness or tracheal deviation.  Cardiovascular:     Rate and Rhythm: Normal rate and regular rhythm.     Heart sounds: Normal heart sounds, S1 normal and S2 normal. No murmur heard. Pulmonary:     Effort: No respiratory distress.     Breath sounds: Normal breath sounds. No stridor. No wheezing or rales.  Lymphadenopathy:     Head:     Right side of head: No tonsillar adenopathy.  Left side of head: No tonsillar adenopathy.     Cervical: No cervical adenopathy.  Skin:    Findings: No erythema or rash.     Nails: There is no clubbing.  Neurological:     Mental Status: She is alert.     Diagnostics:    Spirometry was performed and demonstrated an FEV1 of 2.18 at 115 % of predicted.  Results of a HRCT obtained 13 April 2022 identifies the following:  Cardiovascular: Heart size normal. No pericardial effusion. Mediastinum/Nodes: No enlarged mediastinal, hilar, or axillary lymph nodes. Lungs/Pleura: No acute pulmonary infiltration. No pleural effusion. No pneumothorax. No significant emphysematous or chronic interstitial change.  Assessment and Plan:   1. Asthma, well controlled, mild persistent   2. Perennial allergic rhinitis   3. LPRD (laryngopharyngeal reflux disease)   4. Immunosuppression ([place])    1.  Treat and prevent inflammation:  A. Flovent 110 - 2 inhalations 3-7 time per week w/spacer (empty lungs) B. Budesonide - 2 sprays each nostril 1-7 times per week  2.  Treat and prevent reflux/LPR:  A. Pantoprazole 40 mg - 1 tablet 1 time per day B. Famotidine 40 mg - 1 tablet in evening  3.  If needed:  A. Nasal saline B. OTC Cetirizine 10 mg - 1 tablet 1 time per day C. Albuterol HFA - 2 inhalations every 4-6 hours  4.  Obtain fall flu vaccine and RSV vaccine  5.  Return in 6 months or earlier if problem.    Neera appears to be doing pretty well at this point in time and we are not going to change  any of her therapy and she will continue to use a relatively low-dose of inhaled steroids and continue to aggressively treat her LPR with a proton pump inhibitor and H2 receptor blocker as noted above.  It does not sound as though she is going to be using any methotrexate in the future so the possibility of her developing a methotrexate induced cough in the future is 0.  I will see her back in this clinic in 6 months or earlier if there is a problem.  Allena Katz, MD Allergy / Immunology [place]

## 2022-04-21 NOTE — Patient Instructions (Addendum)
  1.  Treat and prevent inflammation:  A. Flovent 110 - 2 inhalations 3-7 time per week w/spacer (empty lungs) B. Budesonide - 2 sprays each nostril 1-7 times per week  2.  Treat and prevent reflux/LPR:  A. Pantoprazole 40 mg - 1 tablet 1 time per day B. Famotidine 40 mg - 1 tablet in evening  3.  If needed:  A. Nasal saline B. OTC Cetirizine 10 mg - 1 tablet 1 time per day C. Albuterol HFA - 2 inhalations every 4-6 hours  4.  Obtain fall flu vaccine and RSV vaccine  5.  Return in 6 months or earlier if problem.

## 2022-04-27 ENCOUNTER — Other Ambulatory Visit: Payer: Self-pay

## 2022-04-27 DIAGNOSIS — H1010 Acute atopic conjunctivitis, unspecified eye: Secondary | ICD-10-CM | POA: Insufficient documentation

## 2022-04-27 DIAGNOSIS — M1990 Unspecified osteoarthritis, unspecified site: Secondary | ICD-10-CM | POA: Insufficient documentation

## 2022-04-27 DIAGNOSIS — M8580 Other specified disorders of bone density and structure, unspecified site: Secondary | ICD-10-CM | POA: Insufficient documentation

## 2022-04-27 DIAGNOSIS — R413 Other amnesia: Secondary | ICD-10-CM | POA: Insufficient documentation

## 2022-04-27 DIAGNOSIS — G43909 Migraine, unspecified, not intractable, without status migrainosus: Secondary | ICD-10-CM | POA: Insufficient documentation

## 2022-04-27 DIAGNOSIS — C4490 Unspecified malignant neoplasm of skin, unspecified: Secondary | ICD-10-CM | POA: Insufficient documentation

## 2022-04-27 DIAGNOSIS — K5792 Diverticulitis of intestine, part unspecified, without perforation or abscess without bleeding: Secondary | ICD-10-CM | POA: Insufficient documentation

## 2022-04-27 DIAGNOSIS — A64 Unspecified sexually transmitted disease: Secondary | ICD-10-CM | POA: Insufficient documentation

## 2022-04-27 DIAGNOSIS — E7800 Pure hypercholesterolemia, unspecified: Secondary | ICD-10-CM | POA: Insufficient documentation

## 2022-04-27 DIAGNOSIS — G4700 Insomnia, unspecified: Secondary | ICD-10-CM | POA: Insufficient documentation

## 2022-04-27 DIAGNOSIS — E039 Hypothyroidism, unspecified: Secondary | ICD-10-CM | POA: Insufficient documentation

## 2022-04-27 DIAGNOSIS — F419 Anxiety disorder, unspecified: Secondary | ICD-10-CM | POA: Insufficient documentation

## 2022-04-27 DIAGNOSIS — M4316 Spondylolisthesis, lumbar region: Secondary | ICD-10-CM

## 2022-04-27 DIAGNOSIS — K219 Gastro-esophageal reflux disease without esophagitis: Secondary | ICD-10-CM | POA: Insufficient documentation

## 2022-04-27 DIAGNOSIS — R079 Chest pain, unspecified: Secondary | ICD-10-CM | POA: Insufficient documentation

## 2022-04-27 DIAGNOSIS — F32A Depression, unspecified: Secondary | ICD-10-CM | POA: Insufficient documentation

## 2022-04-27 DIAGNOSIS — J45909 Unspecified asthma, uncomplicated: Secondary | ICD-10-CM | POA: Insufficient documentation

## 2022-04-27 DIAGNOSIS — H409 Unspecified glaucoma: Secondary | ICD-10-CM | POA: Insufficient documentation

## 2022-04-27 DIAGNOSIS — F330 Major depressive disorder, recurrent, mild: Secondary | ICD-10-CM | POA: Insufficient documentation

## 2022-04-27 HISTORY — DX: Unspecified glaucoma: H40.9

## 2022-04-27 HISTORY — DX: Spondylolisthesis, lumbar region: M43.16

## 2022-04-29 ENCOUNTER — Ambulatory Visit: Payer: Medicare Other | Admitting: Physician Assistant

## 2022-04-29 ENCOUNTER — Encounter: Payer: Self-pay | Admitting: Physician Assistant

## 2022-04-29 VITALS — BP 148/93 | HR 69 | Ht 61.0 in | Wt 154.6 lb

## 2022-04-29 DIAGNOSIS — R413 Other amnesia: Secondary | ICD-10-CM | POA: Diagnosis not present

## 2022-04-29 NOTE — Progress Notes (Signed)
Assessment/Plan:   Memory Difficulties, unclear etiology.  Olivia Cruz is a very pleasant 71 y.o. RH female  with a history of psoriatic arthritis on Humira, hypothyroidism, RLS, asthma, hyperlipidemia, depression, glaucoma, laryngopharyngeal reflux, migraines, osteoarthritis seen today in follow up to discuss the MRI results.  This imaging was remarkable for mild parenchymal volume loss similar to prior studies, and very mild chronic microvascular ischemic changes, no acute findings.  MoCA on August 2023 was 26/30.  EEG was negative for seizures.   Follow up after neurocognitive testing Recommended control of cardiovascular risk factors Recommend cognitive behavioral therapy for stress issues. No indication for antidementia medication at this time.    Subjective:    This patient is here alone previous records as well as any outside records available were reviewed prior to todays visit.    Any changes in memory since last visit?  No significant changes from prior visit.  She may be "better trying to remember names by repeating them      CURRENT MEDICATIONS:  Outpatient Encounter Medications as of 04/29/2022  Medication Sig   acetaminophen (TYLENOL) 500 MG tablet Take 500 mg by mouth as needed for pain or headache.   albuterol (VENTOLIN HFA) 108 (90 Base) MCG/ACT inhaler Inhale 2 puffs into the lungs every 6 (six) hours as needed for wheezing or shortness of breath.   atorvastatin (LIPITOR) 10 MG tablet Take 10 mg by mouth daily.   Biotin w/ Vitamins C & E (HAIR/SKIN/NAILS PO) Take 1 tablet by mouth daily.   budesonide (RHINOCORT AQUA) 32 MCG/ACT nasal spray Place 1 spray into both nostrils daily. Reported on 02/04/2016   Calcium-Vitamin D-Vitamin K [phone] MG-UNT-MCG CHEW Chew 1 tablet by mouth daily.   cholecalciferol (VITAMIN D3) 25 MCG (1000 UNIT) tablet Take 1,000 Units by mouth in the morning, at noon, and at bedtime.   diclofenac Sodium (VOLTAREN) 1 % GEL Apply 1  Application topically as needed for pain.   DULoxetine (CYMBALTA) 60 MG capsule Take 1 capsule (60 mg total) by mouth 2 (two) times daily.   famotidine (PEPCID) 40 MG tablet TAKE 1 TABLET BY MOUTH EVERYDAY AT BEDTIME   FLOVENT HFA 110 MCG/ACT inhaler Inhale two puffs twice daily to prevent cough or wheeze.  Rinse, gargle, and spit after use.   gabapentin (NEURONTIN) 300 MG capsule Take 300 mg by mouth in the morning, at noon, and at bedtime.   levothyroxine (SYNTHROID) 50 MCG tablet TAKE 1 TABLET BY MOUTH DAILY   Magnesium 250 MG TABS Take 250 mg by mouth daily.   methotrexate (RHEUMATREX) 2.5 MG tablet Take 5 tablets by mouth once a week. Caution:Chemotherapy. Protect from light.   pantoprazole (PROTONIX) 40 MG tablet TAKE 1 TABLET BY MOUTH DAILY   valACYclovir (VALTREX) 500 MG tablet Take 1 tablet (500 mg total) by mouth 3 (three) times daily as needed (herpes outbreak).   No facility-administered encounter medications on file as of 04/29/2022.        No data to display            04/04/2022    9:00 AM  Montreal Cognitive Assessment   Visuospatial/ Executive (0/5) 4  Naming (0/3) 3  Attention: Read list of digits (0/2) 2  Attention: Read list of letters (0/1) 1  Attention: Serial 7 subtraction starting at 100 (0/3) 2  Language: Repeat phrase (0/2) 2  Language : Fluency (0/1) 1  Abstraction (0/2) 2  Delayed Recall (0/5) 4  Orientation (0/6) 5  Total 26  Adjusted Score (based on education) 26   Thank you for allowing [place] the opportunity to participate in the care of this nice patient. Please do not hesitate to contact us for any questions or concerns.   Total time spent on today's visit was 33 minutes dedicated to this patient today, preparing to see patient, examining the patient, ordering tests and/or medications and counseling the patient, documenting clinical information in the EHR or other health record, independently interpreting results and communicating results to the  patient/family, discussing treatment and goals, answering patient's questions and coordinating care.  Cc:  Donald Prose, MD  Sharene Butters 04/29/2022 7:38 AM

## 2022-04-29 NOTE — Patient Instructions (Signed)
It was a pleasure to see you today at our office.   Recommendations:  CBT/ behavioral therapy recommended  Follow up after neurocognitive testing Neurocognitive testing  Recommend good control of the cardiovascular risk factors, sugars, blood pressure and cholesterol   Whom to call:  Memory  decline, memory medications: Call our office [phone]   For psychiatric meds, mood meds: Please have your primary care physician manage these medications.   Counseling regarding caregiver distress, including caregiver depression, anxiety and issues regarding community resources, adult day care programs, adult living facilities, or memory care questions:   Feel free to contact [place], Social Worker at [phone]   If you have any severe symptoms of a stroke, or other severe issues such as confusion,severe chills or fever, etc call 911 or go to the ER as you may need to be evaluated further      RECOMMENDATIONS FOR ALL PATIENTS WITH MEMORY PROBLEMS: 1. Continue to exercise (Recommend 30 minutes of walking everyday, or 3 hours every week) 2. Increase social interactions - continue going to [place] and enjoy social gatherings with friends and family 3. Eat healthy, avoid fried foods and eat more fruits and vegetables 4. Maintain adequate blood pressure, blood sugar, and blood cholesterol level. Reducing the risk of stroke and cardiovascular disease also helps promoting better memory. 5. Avoid stressful situations. Live a simple life and avoid aggravations. Organize your time and prepare for the next day in anticipation. 6. Sleep well, avoid any interruptions of sleep and avoid any distractions in the bedroom that may interfere with adequate sleep quality 7. Avoid sugar, avoid sweets as there is a strong link between excessive sugar intake, diabetes, and cognitive impairment We discussed the Mediterranean diet, which has been shown to help patients reduce the risk of progressive  memory disorders and reduces cardiovascular risk. This includes eating fish, eat fruits and green leafy vegetables, nuts like almonds and hazelnuts, walnuts, and also use olive oil. Avoid fast foods and fried foods as much as possible. Avoid sweets and sugar as sugar use has been linked to worsening of memory function.  There is always a concern of gradual progression of memory problems. If this is the case, then we may need to adjust level of care according to patient needs. Support, both to the patient and caregiver, should then be put into place.      FALL PRECAUTIONS: Be cautious when walking. Scan the area for obstacles that may increase the risk of trips and falls. When getting up in the mornings, sit up at the edge of the bed for a few minutes before getting out of bed. Consider elevating the bed at the head end to avoid drop of blood pressure when getting up. Walk always in a well-lit room (use night lights in the walls). Avoid area rugs or power cords from appliances in the middle of the walkways. Use a walker or a cane if necessary and consider physical therapy for balance exercise. Get your eyesight checked regularly.  FINANCIAL OVERSIGHT: Supervision, especially oversight when making financial decisions or transactions is also recommended.  HOME SAFETY: Consider the safety of the kitchen when operating appliances like stoves, microwave oven, and blender. Consider having supervision and share cooking responsibilities until no longer able to participate in those. Accidents with firearms and other hazards in the house should be identified and addressed as well.   ABILITY TO BE LEFT ALONE: If patient is unable to contact 911 operator, consider using LifeLine, or when the  need is there, arrange for someone to stay with patients. Smoking is a fire hazard, consider supervision or cessation. Risk of wandering should be assessed by caregiver and if detected at any point, supervision and safe proof  recommendations should be instituted.  MEDICATION SUPERVISION: Inability to self-administer medication needs to be constantly addressed. Implement a mechanism to ensure safe administration of the medications.   DRIVING: Regarding driving, in patients with progressive memory problems, driving will be impaired. We advise to have someone else do the driving if trouble finding directions or if minor accidents are reported. Independent driving assessment is available to determine safety of driving.   If you are interested in the driving assessment, you can contact the following:  [employer] in [place]  [place] [phone] or [phone]    [place] refers to food and lifestyle choices that are based on the traditions of countries located on the [employer]. This way of eating has been shown to help prevent certain conditions and improve outcomes for people who have chronic diseases, like kidney disease and heart disease. What are tips for following this plan? Lifestyle  Cook and eat meals together with your family, when possible. Drink enough fluid to keep your urine clear or pale yellow. Be physically active every day. This includes: Aerobic exercise like running or swimming. Leisure activities like gardening, walking, or housework. Get 7-8 hours of sleep each night. If recommended by your health care provider, drink red wine in moderation. This means 1 glass a day for nonpregnant women and 2 glasses a day for men. A glass of wine equals 5 oz (150 mL). Reading food labels  Check the serving size of packaged foods. For foods such as rice and pasta, the serving size refers to the amount of cooked product, not dry. Check the total fat in packaged foods. Avoid foods that have saturated fat or trans fats. Check the ingredients list for  added sugars, such as corn syrup. Shopping  At the grocery store, buy most of your food from the areas near the walls of the store. This includes: Fresh fruits and vegetables (produce). Grains, beans, nuts, and seeds. Some of these may be available in unpackaged forms or large amounts (in bulk). Fresh seafood. Poultry and eggs. Low-fat dairy products. Buy whole ingredients instead of prepackaged foods. Buy fresh fruits and vegetables in-season from local farmers markets. Buy frozen fruits and vegetables in resealable bags. If you do not have access to quality fresh seafood, buy precooked frozen shrimp or canned fish, such as tuna, salmon, or sardines. Buy small amounts of raw or cooked vegetables, salads, or olives from the deli or salad bar at your store. Stock your pantry so you always have certain foods on hand, such as olive oil, canned tuna, canned tomatoes, rice, pasta, and beans. Cooking  Cook foods with extra-virgin olive oil instead of using butter or other vegetable oils. Have meat as a side dish, and have vegetables or grains as your main dish. This means having meat in small portions or adding small amounts of meat to foods like pasta or stew. Use beans or vegetables instead of meat in common dishes like chili or lasagna. Experiment with different cooking methods. Try roasting or broiling vegetables instead of steaming or sauteing them. Add frozen vegetables to soups, stews, pasta, or rice. Add nuts or seeds for added healthy fat at each meal. You can  add these to yogurt, salads, or vegetable dishes. Marinate fish or vegetables using olive oil, lemon juice, garlic, and fresh herbs. Meal planning  Plan to eat 1 vegetarian meal one day each week. Try to work up to 2 vegetarian meals, if possible. Eat seafood 2 or more times a week. Have healthy snacks readily available, such as: Vegetable sticks with hummus. Greek yogurt. Fruit and nut trail mix. Eat balanced meals throughout  the week. This includes: Fruit: 2-3 servings a day Vegetables: 4-5 servings a day Low-fat dairy: 2 servings a day Fish, poultry, or lean meat: 1 serving a day Beans and legumes: 2 or more servings a week Nuts and seeds: 1-2 servings a day Whole grains: 6-8 servings a day Extra-virgin olive oil: 3-4 servings a day Limit red meat and sweets to only a few servings a month What are my food choices? Mediterranean diet Recommended Grains: Whole-grain pasta. Brown rice. Bulgar wheat. Polenta. Couscous. Whole-wheat bread. [place]. Vegetables: Artichokes. Beets. Broccoli. Cabbage. Carrots. Eggplant. Green beans. Chard. Kale. Spinach. Onions. Leeks. Peas. Squash. Tomatoes. Peppers. Radishes. Fruits: Apples. Apricots. Avocado. Berries. Bananas. Cherries. Dates. Figs. Grapes. Lemons. Melon. Oranges. Peaches. Plums. Pomegranate. Meats and other protein foods: Beans. Almonds. Sunflower seeds. Pine nuts. Peanuts. [place]. Salmon. Scallops. Shrimp. [place]. Tilapia. Clams. Oysters. Eggs. Dairy: Low-fat milk. Cheese. Greek yogurt. Beverages: Water. Red wine. Herbal tea. Fats and oils: Extra virgin olive oil. Avocado oil. Grape seed oil. Sweets and desserts: [place] yogurt with honey. Baked apples. Poached pears. Trail mix. Seasoning and other foods: Basil. Cilantro. Coriander. Cumin. Mint. Parsley. Sage. Rosemary. Tarragon. Garlic. Oregano. Thyme. Pepper. Balsalmic vinegar. Tahini. Hummus. Tomato sauce. Olives. Mushrooms. Limit these Grains: Prepackaged pasta or rice dishes. Prepackaged cereal with added sugar. Vegetables: Deep fried potatoes (french fries). Fruits: Fruit canned in syrup. Meats and other protein foods: Beef. Pork. Lamb. Poultry with skin. Hot dogs. [place] Salines. Dairy: Ice cream. Sour cream. Whole milk. Beverages: Juice. Sugar-sweetened soft drinks. Beer. Liquor and spirits. Fats and oils: Butter. Canola oil. Vegetable oil. Beef fat (tallow). Lard. Sweets and desserts: Cookies. Cakes. Pies.  Candy. Seasoning and other foods: Mayonnaise. Premade sauces and marinades. The items listed may not be a complete list. Talk with your dietitian about what dietary choices are right for you. Summary The Mediterranean diet includes both food and lifestyle choices. Eat a variety of fresh fruits and vegetables, beans, nuts, seeds, and whole grains. Limit the amount of red meat and sweets that you eat. Talk with your health care provider about whether it is safe for you to drink red wine in moderation. This means 1 glass a day for nonpregnant women and 2 glasses a day for men. A glass of wine equals 5 oz (150 mL). This information is not intended to replace advice given to you by your health care provider. Make sure you discuss any questions you have with your health care provider. Document Released: 03/31/2016 Document Revised: 05/03/2016 Document Reviewed: 03/31/2016 Elsevier Interactive Patient Education  2017 [employer].   We have sent a referral to [facility] for your MRI and they will call you directly to schedule your appointment. They are located at [address]. If you need to contact them directly please call [phone].  Your provider has requested that you have labwork completed today. Please go to [facility] ([id number]) on the second floor of this building before leaving the office today. You do not need to check in. If you are not called within 15 minutes please  check with the front desk.

## 2022-05-04 ENCOUNTER — Encounter: Payer: Self-pay | Admitting: Cardiology

## 2022-05-04 ENCOUNTER — Ambulatory Visit: Payer: Medicare Other | Attending: Cardiology | Admitting: Cardiology

## 2022-05-04 VITALS — BP 128/80 | HR 80 | Ht 60.5 in | Wt 154.4 lb

## 2022-05-04 DIAGNOSIS — R079 Chest pain, unspecified: Secondary | ICD-10-CM

## 2022-05-04 DIAGNOSIS — R011 Cardiac murmur, unspecified: Secondary | ICD-10-CM

## 2022-05-04 DIAGNOSIS — E782 Mixed hyperlipidemia: Secondary | ICD-10-CM

## 2022-05-04 HISTORY — DX: Cardiac murmur, unspecified: R01.1

## 2022-05-04 HISTORY — DX: Chest pain, unspecified: R07.9

## 2022-05-04 MED ORDER — NITROGLYCERIN 0.4 MG SL SUBL: 0.4000 mg | SUBLINGUAL_TABLET | SUBLINGUAL | 6 refills | Status: AC | PRN

## 2022-05-04 NOTE — Progress Notes (Signed)
Cardiology Office Note:    Date:  05/04/2022   ID:  Olivia Cruz, DOB Aug 24, 1950, MRN [id number]  PCP:  Donald Prose, MD  Cardiologist:  Jenean Lindau, MD   Referring MD: Alexis Frock, PA    ASSESSMENT:    1. Mixed hyperlipidemia   2. Chest pain of uncertain etiology   3. Cardiac murmur    PLAN:    In order of problems listed above:  Primary prevention stressed with the patient.  Importance of compliance with diet medication stressed and she vocalized understanding. Chest pain: Atypical in nature.  However in view of risk factors we will do an exercise stress Cardiolite.  She is agreeable. Cardiac murmur: Echocardiogram will be done to assess murmur heard on auscultation. Mixed dyslipidemia: Lipids are markedly elevated.  I discussed this with her at length.  Lipids followed by primary care.  We gave her a low-cholesterol diet sheet. History of essential hypertension: Blood pressure stable today.  She will keep a track of her blood pressures at home and let us know if they are elevated.  I educated her about this.  Salt intake issues lifestyle modification discussed and she vocalized understanding and questions were answered to her satisfaction. Patient will be seen in follow-up appointment in 9 months or earlier if the patient has any concerns    Medication Adjustments/Labs and Tests Ordered: Current medicines are reviewed at length with the patient today.  Concerns regarding medicines are outlined above.  No orders of the defined types were placed in this encounter.  No orders of the defined types were placed in this encounter.    History of Present Illness:    Olivia Cruz is a 71 y.o. female who is being seen today for the evaluation of chest pain at the request of Alexis Frock, [place].  Patient has past medical history of mixed dyslipidemia.  She has rheumatological issues for which she sees a rheumatologist.  There is also history of memory changes.   Patient mentions to me that she has chest discomfort at times.  This is dull in nature.  No radiation to the neck or to the arms.  Does not occur on exertion.  She leads a sedentary lifestyle.  At the time of my evaluation, the patient is alert awake oriented and in no distress.  She also gives history of generalized fatigue and therefore her rheumatologist wanted [place] to evaluate her.  Past Medical History:  Diagnosis Date   Achilles tendonosis 10/06/2020   Acute cystitis with hematuria 09/29/2019   Acute infection of nasal sinus 06/21/2021   Allergic rhinoconjunctivitis    Anxiety    Asthma    Cervical os stenosis 04/09/2015   Chest pain    Chronic back pain 09/23/2021   Chronic knee pain after total replacement of right knee joint 07/21/2020   Chronic right shoulder pain 05/25/2021   Depression    Depression, major, recurrent, mild (HCC)    Diverticulitis    Dysuria 07/12/2021   GERD (gastroesophageal reflux disease)    Glaucoma    Hypercholesterolemia    Hypothyroidism    Insomnia    Laryngopharyngeal reflux (LPR)    Memory changes    Migraine without aura and without status migrainosus, not intractable 05/07/2021   Migraines    Mild recurrent major depression ([place]) 07/12/2021   Mixed hyperlipidemia 12/22/2019   Muscle wasting and atrophy, not elsewhere classified, left hand 12/01/2020   Osteoarthritis    Osteopenia  Postmenopausal atrophic vaginitis 04/09/2015   Prediabetes 12/22/2019   Psoriatic arthritis ([place])    RLS (restless legs syndrome)    Seasonal allergic rhinitis due to pollen 06/21/2021   Secondary hypothyroidism 12/22/2019   Skin cancer    Spondylolisthesis at L4-L5 level 04/27/2022   Status post total right knee replacement 12/29/2021   STD (sexually transmitted disease)    HSV II   Tendinopathy of right rotator cuff 05/25/2021   Vocal fold atrophy 09/12/2018    Past Surgical History:  Procedure Laterality Date   BELPHAROPTOSIS REPAIR     BREAST SURGERY Left    times  2   COLONOSCOPY  06/03/2013   Moderate predominantly sigmoid diverticulosis. Small interal hemorroids   FOOT SURGERY Right    Removed bone spur   GANGLION CYST EXCISION Left    foot   HEMORRHOID SURGERY     SKIN CANCER EXCISION     TOTAL KNEE ARTHROPLASTY Right 07/21/2020   UPPER GI ENDOSCOPY  03/23/2016   Mild gastritis, gastric polyps bxbenign squamous mucosa with no abnormaility, fundic glad poly in setting of mild chron gastritis.    Current Medications: Current Meds  Medication Sig   acetaminophen (TYLENOL) 500 MG tablet Take 500 mg by mouth as needed for pain or headache.   Adalimumab (HUMIRA PEN) 40 MG/0.4ML PNKT Inject into the skin. Unsure if dose is 40 mg- every other week   albuterol (VENTOLIN HFA) 108 (90 Base) MCG/ACT inhaler Inhale 2 puffs into the lungs every 6 (six) hours as needed for wheezing or shortness of breath.   atorvastatin (LIPITOR) 10 MG tablet Take 10 mg by mouth daily.   Biotin w/ Vitamins C & E (HAIR/SKIN/NAILS PO) Take 1 tablet by mouth daily.   budesonide (RHINOCORT AQUA) 32 MCG/ACT nasal spray Place 1 spray into both nostrils daily. Reported on 02/04/2016   Calcium-Vitamin D-Vitamin K [phone] MG-UNT-MCG CHEW Chew 1 tablet by mouth daily.   cholecalciferol (VITAMIN D3) 25 MCG (1000 UNIT) tablet Take 1,000 Units by mouth in the morning, at noon, and at bedtime.   diclofenac Sodium (VOLTAREN) 1 % GEL Apply 1 Application topically as needed for pain.   DULoxetine (CYMBALTA) 60 MG capsule Take 1 capsule (60 mg total) by mouth 2 (two) times daily.   famotidine (PEPCID) 40 MG tablet TAKE 1 TABLET BY MOUTH EVERYDAY AT BEDTIME   FLOVENT HFA 110 MCG/ACT inhaler Inhale two puffs twice daily to prevent cough or wheeze.  Rinse, gargle, and spit after use.   gabapentin (NEURONTIN) 300 MG capsule Take 300 mg by mouth in the morning, at noon, and at bedtime.   levothyroxine (SYNTHROID) 50 MCG tablet TAKE 1 TABLET BY MOUTH DAILY   Magnesium 250 MG TABS Take 250 mg by  mouth daily.   pantoprazole (PROTONIX) 40 MG tablet TAKE 1 TABLET BY MOUTH DAILY   valACYclovir (VALTREX) 500 MG tablet Take 1 tablet (500 mg total) by mouth 3 (three) times daily as needed (herpes outbreak).     Allergies:   Codeine, Ambien [zolpidem tartrate], Cyproheptadine hcl, Dilaudid [hydromorphone], Hydrocodone, Lisinopril, Pramipexole, Sulfa antibiotics, and Tramadol   Social History   Socioeconomic History   Marital status: Married    Spouse name: Not on file   Number of children: Not on file   Years of education: Not on file   Highest education level: Not on file  Occupational History   Not on file  Tobacco Use   Smoking status: Former    Types: Cigarettes  Quit date: 35    Years since quitting: 37.7   Smokeless tobacco: Never  Vaping Use   Vaping Use: Never used  Substance and Sexual Activity   Alcohol use: No    Alcohol/week: 0.0 standard drinks of alcohol   Drug use: No   Sexual activity: Yes    Partners: Male    Comment: husband vasectomy  Other Topics Concern   Not on file  Social History Narrative   Right handed   Drinks caffeine prn   Social Determinants of Health   Financial Resource Strain: Low Risk  (10/07/2021)   Overall Financial Resource Strain (CARDIA)    Difficulty of Paying Living Expenses: Not very hard  Food Insecurity: No Food Insecurity (12/02/2020)   Hunger Vital Sign    Worried About Running Out of Food in the Last Year: Never true    Ran Out of Food in the Last Year: Never true  Transportation Needs: No Transportation Needs (10/07/2021)   PRAPARE - Hydrologist (Medical): No    Lack of Transportation (Non-Medical): No  Physical Activity: Sufficiently Active (12/02/2020)   Exercise Vital Sign    Days of Exercise per Week: 5 days    Minutes of Exercise per Session: 30 min  Stress: No Stress Concern Present (12/02/2020)   [place]     Feeling of Stress : Only a little  Social Connections: Socially Integrated (12/02/2020)   Social Connection and Isolation Panel [NHANES]    Frequency of Communication with Friends and Family: More than three times a week    Frequency of Social Gatherings with Friends and Family: More than three times a week    Attends Religious Services: More than 4 times per year    Active Member of [employer] or Organizations: Yes    Attends Music therapist: More than 4 times per year    Marital Status: Married     Family History: The patient's family history includes Breast cancer in her sister; Cancer in her maternal grandmother and paternal grandmother; Depression in her mother; Diverticulitis in her father; Glaucoma in her father; Heart disease in her mother; Heart failure in her mother; Hyperlipidemia in her mother; Hypertension in her mother; Multiple births in her maternal grandmother and paternal grandmother; Parkinson's disease in her father; Stroke in her mother; Thyroid disease in her mother.  ROS:   Please see the history of present illness.    All other systems reviewed and are negative.  EKGs/Labs/Other Studies Reviewed:    The following studies were reviewed today: EKG reveals sinus rhythm and nonspecific ST-T changes   Recent Labs: 10/26/2021: ALT 52; BUN 27; Creatinine, Ser 0.93; Hemoglobin 16.9; Platelets 251; Potassium 4.9; Sodium 134 04/04/2022: TSH 1.98  Recent Lipid Panel    Component Value Date/Time   CHOL 114 07/12/2021 0803   TRIG 93 07/12/2021 0803   HDL 52 07/12/2021 0803   CHOLHDL 2.2 07/12/2021 0803   CHOLHDL 2.1 02/18/2013 1029   VLDL 16 02/18/2013 1029   LDLCALC 44 07/12/2021 0803    Physical Exam:    VS:  BP 128/80 (BP Location: Left Arm, Patient Position: Sitting)   Pulse 80   Ht 5' 0.5" (1.537 m)   Wt 154 lb 6.4 oz (70 kg)   LMP 08/22/2005   SpO2 98%   BMI 29.66 kg/m     Wt Readings from Last 3 Encounters:  05/04/22 154 lb 6.4 oz (70 kg)  04/29/22 154 lb 9.6 oz (70.1 kg)  04/04/22 153 lb (69.4 kg)     GEN: Patient is in no acute distress HEENT: Normal NECK: No JVD; No carotid bruits LYMPHATICS: No lymphadenopathy CARDIAC: S1 S2 regular, 2/6 systolic murmur at the apex. RESPIRATORY:  Clear to auscultation without rales, wheezing or rhonchi  ABDOMEN: Soft, non-tender, non-distended MUSCULOSKELETAL:  No edema; No deformity  SKIN: Warm and dry NEUROLOGIC:  Alert and oriented x 3 PSYCHIATRIC:  Normal affect    Signed, Jenean Lindau, MD  05/04/2022 9:15 AM    [place]

## 2022-05-04 NOTE — Patient Instructions (Addendum)
Medication Instructions:  Nitroglycerin Use nitroglycerin 1 tablet placed under the tongue at the first sign of chest pain or an angina attack. 1 tablet may be used every 5 minutes as needed, for up to 15 minutes. Do not take more than 3 tablets in 15 minutes. If pain persist call 911 or go to the nearest ED.     Lab Work: None Ordered If you have labs (blood work) drawn today and your tests are completely normal, you will receive your results only by: [place] (if you have MyChart) OR A paper copy in the mail If you have any lab test that is abnormal or we need to change your treatment, we will call you to review the results.   Testing/Procedures: Your physician has requested that you have an echocardiogram. Echocardiography is a painless test that uses sound waves to create images of your heart. It provides your doctor with information about the size and shape of your heart and how well your heart's chambers and valves are working. This procedure takes approximately one hour. There are no restrictions for this procedure.   Your physician has requested that you have a Exercise myoview. For further information please visit [email or web address]. Please follow instruction sheet, as given.  The test will take approximately 3 to 4 hours to complete; you may bring reading material.  If someone comes with you to your appointment, they will need to remain in the main lobby due to limited space in the testing area.  How to prepare for your Myocardial Perfusion Test: Do not eat or drink 3 hours prior to your test, except you may have water. Do not consume products containing caffeine (regular or decaffeinated) 12 hours prior to your test. (ex: coffee, chocolate, sodas, tea). Do bring a list of your current medications with you.  If not listed below, you may take your medications as normal. Do wear comfortable clothes (no dresses or overalls) and walking shoes, tennis shoes preferred (No heels or  open toe shoes are allowed). Do NOT wear cologne, perfume, aftershave, or lotions (deodorant is allowed). If these instructions are not followed, your test will have to be rescheduled.     Follow-Up: At [facility], [place], you and your health needs are our priority.  As part of our continuing mission to provide you with exceptional heart care, we have created designated Provider Care Teams.  These Care Teams include your primary Cardiologist (physician) and Advanced Practice Providers (APPs -  Physician Assistants and Nurse Practitioners) who all work together to provide you with the care you need, when you need it.  We recommend signing up for the patient portal called "MyChart".  Sign up information is provided on this After Visit Summary.  MyChart is used to connect with patients for Virtual Visits (Telemedicine).  Patients are able to view lab/test results, encounter notes, upcoming appointments, etc.  Non-urgent messages can be sent to your provider as well.   To learn more about what you can do with MyChart, go to [email or web address].    Your next appointment:   9 month(s)  The format for your next appointment:   In Person  Provider:   Jyl Heinz, MD    Other Instructions NA

## 2022-05-10 ENCOUNTER — Ambulatory Visit: Payer: Medicare Other | Admitting: Physician Assistant

## 2022-05-10 ENCOUNTER — Telehealth (HOSPITAL_COMMUNITY): Payer: Self-pay

## 2022-05-10 NOTE — Telephone Encounter (Signed)
Attempted to contact the patient with her instructions for her stress test. Unable to leave a message. Will try again later. S.Rillie Riffel EMTP

## 2022-05-12 ENCOUNTER — Ambulatory Visit: Payer: Medicare Other | Attending: Cardiology

## 2022-05-12 DIAGNOSIS — R079 Chest pain, unspecified: Secondary | ICD-10-CM | POA: Diagnosis not present

## 2022-05-12 LAB — MYOCARDIAL PERFUSION IMAGING
Exercise duration (min): 5 min
Exercise duration (sec): 1 s
LV dias vol: 51 mL (ref 46–106)
LV sys vol: 12 mL
MPHR: 148 {beats}/min
Nuc Stress EF: 76 %
Peak HR: 134 {beats}/min
Percent HR: 90 %
Rest HR: 76 {beats}/min
Rest Nuclear Isotope Dose: 10.9 mCi
SDS: 3
SRS: 3
SSS: 6
Stress Nuclear Isotope Dose: 32.1 mCi
TID: 0.75

## 2022-05-12 MED ORDER — TECHNETIUM TC 99M TETROFOSMIN IV KIT
32.1000 | PACK | Freq: Once | INTRAVENOUS | Status: AC | PRN
  Administered 2022-05-12: 32.1 via INTRAVENOUS

## 2022-05-12 MED ORDER — TECHNETIUM TC 99M TETROFOSMIN IV KIT
10.9000 | PACK | Freq: Once | INTRAVENOUS | Status: AC | PRN
  Administered 2022-05-12: 10.9 via INTRAVENOUS

## 2022-05-13 ENCOUNTER — Encounter: Payer: Self-pay | Admitting: Cardiology

## 2022-05-16 DIAGNOSIS — M1712 Unilateral primary osteoarthritis, left knee: Secondary | ICD-10-CM | POA: Diagnosis not present

## 2022-05-17 ENCOUNTER — Ambulatory Visit: Payer: Medicare Other | Attending: Cardiology

## 2022-05-17 DIAGNOSIS — R011 Cardiac murmur, unspecified: Secondary | ICD-10-CM | POA: Diagnosis not present

## 2022-05-17 LAB — ECHOCARDIOGRAM COMPLETE
Area-P 1/2: 3.23 cm2
S' Lateral: 2.3 cm

## 2022-05-19 ENCOUNTER — Telehealth: Payer: Self-pay | Admitting: Cardiology

## 2022-05-19 NOTE — Telephone Encounter (Signed)
Results reviewed with pt as per Dr. Revankar's note.  Pt verbalized understanding and had no additional questions. Routed to PCP.  

## 2022-05-19 NOTE — Telephone Encounter (Signed)
Patient calling to go over echo results.

## 2022-05-23 DIAGNOSIS — M1712 Unilateral primary osteoarthritis, left knee: Secondary | ICD-10-CM | POA: Diagnosis not present

## 2022-05-24 ENCOUNTER — Ambulatory Visit (HOSPITAL_COMMUNITY): Payer: Medicare Other

## 2022-05-26 DIAGNOSIS — R42 Dizziness and giddiness: Secondary | ICD-10-CM | POA: Diagnosis not present

## 2022-06-01 DIAGNOSIS — M1712 Unilateral primary osteoarthritis, left knee: Secondary | ICD-10-CM | POA: Diagnosis not present

## 2022-06-03 DIAGNOSIS — E039 Hypothyroidism, unspecified: Secondary | ICD-10-CM | POA: Diagnosis not present

## 2022-06-03 DIAGNOSIS — E7800 Pure hypercholesterolemia, unspecified: Secondary | ICD-10-CM | POA: Diagnosis not present

## 2022-06-14 DIAGNOSIS — L820 Inflamed seborrheic keratosis: Secondary | ICD-10-CM | POA: Diagnosis not present

## 2022-06-14 DIAGNOSIS — B351 Tinea unguium: Secondary | ICD-10-CM | POA: Diagnosis not present

## 2022-06-19 ENCOUNTER — Other Ambulatory Visit: Payer: Self-pay | Admitting: Allergy and Immunology

## 2022-06-29 ENCOUNTER — Other Ambulatory Visit: Payer: Self-pay

## 2022-06-29 MED ORDER — FAMOTIDINE 40 MG PO TABS: ORAL_TABLET | ORAL | 0 refills | Status: DC

## 2022-06-30 DIAGNOSIS — R5383 Other fatigue: Secondary | ICD-10-CM | POA: Diagnosis not present

## 2022-06-30 DIAGNOSIS — Z85828 Personal history of other malignant neoplasm of skin: Secondary | ICD-10-CM | POA: Diagnosis not present

## 2022-06-30 DIAGNOSIS — Z79620 Long term (current) use of immunosuppressive biologic: Secondary | ICD-10-CM | POA: Diagnosis not present

## 2022-06-30 DIAGNOSIS — R29898 Other symptoms and signs involving the musculoskeletal system: Secondary | ICD-10-CM | POA: Diagnosis not present

## 2022-06-30 DIAGNOSIS — M1990 Unspecified osteoarthritis, unspecified site: Secondary | ICD-10-CM | POA: Diagnosis not present

## 2022-06-30 DIAGNOSIS — Z79899 Other long term (current) drug therapy: Secondary | ICD-10-CM | POA: Diagnosis not present

## 2022-06-30 DIAGNOSIS — Z5181 Encounter for therapeutic drug level monitoring: Secondary | ICD-10-CM | POA: Diagnosis not present

## 2022-06-30 DIAGNOSIS — L4050 Arthropathic psoriasis, unspecified: Secondary | ICD-10-CM | POA: Diagnosis not present

## 2022-06-30 DIAGNOSIS — Z8719 Personal history of other diseases of the digestive system: Secondary | ICD-10-CM | POA: Diagnosis not present

## 2022-06-30 DIAGNOSIS — Z87440 Personal history of urinary (tract) infections: Secondary | ICD-10-CM | POA: Diagnosis not present

## 2022-06-30 DIAGNOSIS — M5489 Other dorsalgia: Secondary | ICD-10-CM | POA: Diagnosis not present

## 2022-06-30 DIAGNOSIS — M25561 Pain in right knee: Secondary | ICD-10-CM | POA: Diagnosis not present

## 2022-06-30 DIAGNOSIS — R6889 Other general symptoms and signs: Secondary | ICD-10-CM | POA: Diagnosis not present

## 2022-07-05 ENCOUNTER — Other Ambulatory Visit: Payer: Self-pay | Admitting: Neurological Surgery

## 2022-07-05 DIAGNOSIS — M4316 Spondylolisthesis, lumbar region: Secondary | ICD-10-CM

## 2022-07-19 ENCOUNTER — Other Ambulatory Visit: Payer: Self-pay | Admitting: Neurological Surgery

## 2022-07-29 DIAGNOSIS — Z792 Long term (current) use of antibiotics: Secondary | ICD-10-CM | POA: Diagnosis not present

## 2022-07-29 DIAGNOSIS — G2581 Restless legs syndrome: Secondary | ICD-10-CM | POA: Diagnosis not present

## 2022-07-29 DIAGNOSIS — E039 Hypothyroidism, unspecified: Secondary | ICD-10-CM | POA: Diagnosis not present

## 2022-07-29 DIAGNOSIS — Z23 Encounter for immunization: Secondary | ICD-10-CM | POA: Diagnosis not present

## 2022-07-29 DIAGNOSIS — E7800 Pure hypercholesterolemia, unspecified: Secondary | ICD-10-CM | POA: Diagnosis not present

## 2022-07-30 ENCOUNTER — Other Ambulatory Visit: Payer: Medicare Other

## 2022-08-04 ENCOUNTER — Ambulatory Visit
Admission: RE | Admit: 2022-08-04 | Discharge: 2022-08-04 | Disposition: A | Discharge status: Home / Self Care | Payer: Medicare Other | Source: Ambulatory Visit | Attending: Neurological Surgery | Admitting: Neurological Surgery

## 2022-08-04 DIAGNOSIS — M4316 Spondylolisthesis, lumbar region: Secondary | ICD-10-CM

## 2022-08-04 DIAGNOSIS — M48061 Spinal stenosis, lumbar region without neurogenic claudication: Secondary | ICD-10-CM | POA: Diagnosis not present

## 2022-08-04 DIAGNOSIS — M47816 Spondylosis without myelopathy or radiculopathy, lumbar region: Secondary | ICD-10-CM | POA: Diagnosis not present

## 2022-08-04 DIAGNOSIS — M545 Low back pain, unspecified: Secondary | ICD-10-CM | POA: Diagnosis not present

## 2022-08-04 DIAGNOSIS — M79604 Pain in right leg: Secondary | ICD-10-CM | POA: Diagnosis not present

## 2022-08-10 DIAGNOSIS — M4316 Spondylolisthesis, lumbar region: Secondary | ICD-10-CM | POA: Diagnosis not present

## 2022-08-11 DIAGNOSIS — M25461 Effusion, right knee: Secondary | ICD-10-CM | POA: Diagnosis not present

## 2022-08-11 DIAGNOSIS — M8588 Other specified disorders of bone density and structure, other site: Secondary | ICD-10-CM | POA: Diagnosis not present

## 2022-08-11 DIAGNOSIS — I999 Unspecified disorder of circulatory system: Secondary | ICD-10-CM | POA: Diagnosis not present

## 2022-08-11 DIAGNOSIS — M7651 Patellar tendinitis, right knee: Secondary | ICD-10-CM | POA: Diagnosis not present

## 2022-08-11 DIAGNOSIS — Z471 Aftercare following joint replacement surgery: Secondary | ICD-10-CM | POA: Diagnosis not present

## 2022-08-11 DIAGNOSIS — Z96651 Presence of right artificial knee joint: Secondary | ICD-10-CM | POA: Diagnosis not present

## 2022-08-11 DIAGNOSIS — M7050 Other bursitis of knee, unspecified knee: Secondary | ICD-10-CM | POA: Diagnosis not present

## 2022-08-11 DIAGNOSIS — M7051 Other bursitis of knee, right knee: Secondary | ICD-10-CM | POA: Diagnosis not present

## 2022-08-22 HISTORY — PX: BACK SURGERY: SHX140

## 2022-08-29 DIAGNOSIS — M6281 Muscle weakness (generalized): Secondary | ICD-10-CM | POA: Diagnosis not present

## 2022-08-29 DIAGNOSIS — M7050 Other bursitis of knee, unspecified knee: Secondary | ICD-10-CM | POA: Diagnosis not present

## 2022-08-29 DIAGNOSIS — M2560 Stiffness of unspecified joint, not elsewhere classified: Secondary | ICD-10-CM | POA: Diagnosis not present

## 2022-08-29 DIAGNOSIS — M25652 Stiffness of left hip, not elsewhere classified: Secondary | ICD-10-CM | POA: Diagnosis not present

## 2022-08-29 DIAGNOSIS — R2689 Other abnormalities of gait and mobility: Secondary | ICD-10-CM | POA: Diagnosis not present

## 2022-08-29 DIAGNOSIS — M25651 Stiffness of right hip, not elsewhere classified: Secondary | ICD-10-CM | POA: Diagnosis not present

## 2022-08-29 DIAGNOSIS — R293 Abnormal posture: Secondary | ICD-10-CM | POA: Diagnosis not present

## 2022-08-30 ENCOUNTER — Ambulatory Visit: Admit: 2022-08-30 | Payer: Medicare Other | Admitting: Neurological Surgery

## 2022-08-30 SURGERY — POSTERIOR LUMBAR FUSION 1 LEVEL
Anesthesia: General | Site: Back

## 2022-09-06 DIAGNOSIS — Z1231 Encounter for screening mammogram for malignant neoplasm of breast: Secondary | ICD-10-CM | POA: Diagnosis not present

## 2022-09-08 DIAGNOSIS — M25652 Stiffness of left hip, not elsewhere classified: Secondary | ICD-10-CM | POA: Diagnosis not present

## 2022-09-08 DIAGNOSIS — M2560 Stiffness of unspecified joint, not elsewhere classified: Secondary | ICD-10-CM | POA: Diagnosis not present

## 2022-09-08 DIAGNOSIS — R293 Abnormal posture: Secondary | ICD-10-CM | POA: Diagnosis not present

## 2022-09-08 DIAGNOSIS — R2689 Other abnormalities of gait and mobility: Secondary | ICD-10-CM | POA: Diagnosis not present

## 2022-09-08 DIAGNOSIS — M25651 Stiffness of right hip, not elsewhere classified: Secondary | ICD-10-CM | POA: Diagnosis not present

## 2022-09-08 DIAGNOSIS — M6281 Muscle weakness (generalized): Secondary | ICD-10-CM | POA: Diagnosis not present

## 2022-09-08 DIAGNOSIS — M7050 Other bursitis of knee, unspecified knee: Secondary | ICD-10-CM | POA: Diagnosis not present

## 2022-09-12 DIAGNOSIS — R2689 Other abnormalities of gait and mobility: Secondary | ICD-10-CM | POA: Diagnosis not present

## 2022-09-12 DIAGNOSIS — M25652 Stiffness of left hip, not elsewhere classified: Secondary | ICD-10-CM | POA: Diagnosis not present

## 2022-09-12 DIAGNOSIS — M2560 Stiffness of unspecified joint, not elsewhere classified: Secondary | ICD-10-CM | POA: Diagnosis not present

## 2022-09-12 DIAGNOSIS — R293 Abnormal posture: Secondary | ICD-10-CM | POA: Diagnosis not present

## 2022-09-12 DIAGNOSIS — M6281 Muscle weakness (generalized): Secondary | ICD-10-CM | POA: Diagnosis not present

## 2022-09-12 DIAGNOSIS — M7050 Other bursitis of knee, unspecified knee: Secondary | ICD-10-CM | POA: Diagnosis not present

## 2022-09-12 DIAGNOSIS — M25651 Stiffness of right hip, not elsewhere classified: Secondary | ICD-10-CM | POA: Diagnosis not present

## 2022-09-16 DIAGNOSIS — M6281 Muscle weakness (generalized): Secondary | ICD-10-CM | POA: Diagnosis not present

## 2022-09-16 DIAGNOSIS — M7050 Other bursitis of knee, unspecified knee: Secondary | ICD-10-CM | POA: Diagnosis not present

## 2022-09-16 DIAGNOSIS — M2560 Stiffness of unspecified joint, not elsewhere classified: Secondary | ICD-10-CM | POA: Diagnosis not present

## 2022-09-16 DIAGNOSIS — R2689 Other abnormalities of gait and mobility: Secondary | ICD-10-CM | POA: Diagnosis not present

## 2022-09-16 DIAGNOSIS — M25651 Stiffness of right hip, not elsewhere classified: Secondary | ICD-10-CM | POA: Diagnosis not present

## 2022-09-16 DIAGNOSIS — M25652 Stiffness of left hip, not elsewhere classified: Secondary | ICD-10-CM | POA: Diagnosis not present

## 2022-09-16 DIAGNOSIS — R293 Abnormal posture: Secondary | ICD-10-CM | POA: Diagnosis not present

## 2022-09-18 ENCOUNTER — Other Ambulatory Visit: Payer: Self-pay | Admitting: Allergy and Immunology

## 2022-09-19 DIAGNOSIS — R2689 Other abnormalities of gait and mobility: Secondary | ICD-10-CM | POA: Diagnosis not present

## 2022-09-19 DIAGNOSIS — R293 Abnormal posture: Secondary | ICD-10-CM | POA: Diagnosis not present

## 2022-09-19 DIAGNOSIS — M2560 Stiffness of unspecified joint, not elsewhere classified: Secondary | ICD-10-CM | POA: Diagnosis not present

## 2022-09-19 DIAGNOSIS — M6281 Muscle weakness (generalized): Secondary | ICD-10-CM | POA: Diagnosis not present

## 2022-09-19 DIAGNOSIS — M25652 Stiffness of left hip, not elsewhere classified: Secondary | ICD-10-CM | POA: Diagnosis not present

## 2022-09-19 DIAGNOSIS — M7050 Other bursitis of knee, unspecified knee: Secondary | ICD-10-CM | POA: Diagnosis not present

## 2022-09-19 DIAGNOSIS — M25651 Stiffness of right hip, not elsewhere classified: Secondary | ICD-10-CM | POA: Diagnosis not present

## 2022-09-22 DIAGNOSIS — M25652 Stiffness of left hip, not elsewhere classified: Secondary | ICD-10-CM | POA: Diagnosis not present

## 2022-09-22 DIAGNOSIS — M7050 Other bursitis of knee, unspecified knee: Secondary | ICD-10-CM | POA: Diagnosis not present

## 2022-09-22 DIAGNOSIS — M25561 Pain in right knee: Secondary | ICD-10-CM | POA: Diagnosis not present

## 2022-09-22 DIAGNOSIS — R293 Abnormal posture: Secondary | ICD-10-CM | POA: Diagnosis not present

## 2022-09-22 DIAGNOSIS — Z880 Allergy status to penicillin: Secondary | ICD-10-CM | POA: Diagnosis not present

## 2022-09-22 DIAGNOSIS — Z888 Allergy status to other drugs, medicaments and biological substances status: Secondary | ICD-10-CM | POA: Diagnosis not present

## 2022-09-22 DIAGNOSIS — Z882 Allergy status to sulfonamides status: Secondary | ICD-10-CM | POA: Diagnosis not present

## 2022-09-22 DIAGNOSIS — M6281 Muscle weakness (generalized): Secondary | ICD-10-CM | POA: Diagnosis not present

## 2022-09-22 DIAGNOSIS — Z96651 Presence of right artificial knee joint: Secondary | ICD-10-CM | POA: Diagnosis not present

## 2022-09-22 DIAGNOSIS — Z885 Allergy status to narcotic agent status: Secondary | ICD-10-CM | POA: Diagnosis not present

## 2022-09-22 DIAGNOSIS — M1712 Unilateral primary osteoarthritis, left knee: Secondary | ICD-10-CM | POA: Insufficient documentation

## 2022-09-22 DIAGNOSIS — R2689 Other abnormalities of gait and mobility: Secondary | ICD-10-CM | POA: Diagnosis not present

## 2022-09-22 HISTORY — DX: Unilateral primary osteoarthritis, left knee: M17.12

## 2022-09-26 DIAGNOSIS — R293 Abnormal posture: Secondary | ICD-10-CM | POA: Diagnosis not present

## 2022-09-26 DIAGNOSIS — M7050 Other bursitis of knee, unspecified knee: Secondary | ICD-10-CM | POA: Diagnosis not present

## 2022-09-26 DIAGNOSIS — M6281 Muscle weakness (generalized): Secondary | ICD-10-CM | POA: Diagnosis not present

## 2022-09-26 DIAGNOSIS — M25652 Stiffness of left hip, not elsewhere classified: Secondary | ICD-10-CM | POA: Diagnosis not present

## 2022-09-26 DIAGNOSIS — M25561 Pain in right knee: Secondary | ICD-10-CM | POA: Diagnosis not present

## 2022-09-26 DIAGNOSIS — R2689 Other abnormalities of gait and mobility: Secondary | ICD-10-CM | POA: Diagnosis not present

## 2022-10-03 DIAGNOSIS — R293 Abnormal posture: Secondary | ICD-10-CM | POA: Diagnosis not present

## 2022-10-03 DIAGNOSIS — M7050 Other bursitis of knee, unspecified knee: Secondary | ICD-10-CM | POA: Diagnosis not present

## 2022-10-03 DIAGNOSIS — R2689 Other abnormalities of gait and mobility: Secondary | ICD-10-CM | POA: Diagnosis not present

## 2022-10-03 DIAGNOSIS — M6281 Muscle weakness (generalized): Secondary | ICD-10-CM | POA: Diagnosis not present

## 2022-10-03 DIAGNOSIS — M25561 Pain in right knee: Secondary | ICD-10-CM | POA: Diagnosis not present

## 2022-10-03 DIAGNOSIS — M25652 Stiffness of left hip, not elsewhere classified: Secondary | ICD-10-CM | POA: Diagnosis not present

## 2022-10-06 DIAGNOSIS — M6281 Muscle weakness (generalized): Secondary | ICD-10-CM | POA: Diagnosis not present

## 2022-10-06 DIAGNOSIS — M25652 Stiffness of left hip, not elsewhere classified: Secondary | ICD-10-CM | POA: Diagnosis not present

## 2022-10-06 DIAGNOSIS — R293 Abnormal posture: Secondary | ICD-10-CM | POA: Diagnosis not present

## 2022-10-06 DIAGNOSIS — M7050 Other bursitis of knee, unspecified knee: Secondary | ICD-10-CM | POA: Diagnosis not present

## 2022-10-06 DIAGNOSIS — R2689 Other abnormalities of gait and mobility: Secondary | ICD-10-CM | POA: Diagnosis not present

## 2022-10-06 DIAGNOSIS — M25561 Pain in right knee: Secondary | ICD-10-CM | POA: Diagnosis not present

## 2022-10-08 ENCOUNTER — Other Ambulatory Visit: Payer: Self-pay | Admitting: Allergy and Immunology

## 2022-10-11 DIAGNOSIS — H25813 Combined forms of age-related cataract, bilateral: Secondary | ICD-10-CM | POA: Diagnosis not present

## 2022-10-11 DIAGNOSIS — H524 Presbyopia: Secondary | ICD-10-CM | POA: Diagnosis not present

## 2022-10-12 DIAGNOSIS — L4050 Arthropathic psoriasis, unspecified: Secondary | ICD-10-CM | POA: Diagnosis not present

## 2022-10-12 DIAGNOSIS — N644 Mastodynia: Secondary | ICD-10-CM | POA: Diagnosis not present

## 2022-10-12 DIAGNOSIS — G2581 Restless legs syndrome: Secondary | ICD-10-CM | POA: Diagnosis not present

## 2022-10-12 DIAGNOSIS — R030 Elevated blood-pressure reading, without diagnosis of hypertension: Secondary | ICD-10-CM | POA: Diagnosis not present

## 2022-10-12 DIAGNOSIS — M1712 Unilateral primary osteoarthritis, left knee: Secondary | ICD-10-CM | POA: Diagnosis not present

## 2022-10-12 DIAGNOSIS — L729 Follicular cyst of the skin and subcutaneous tissue, unspecified: Secondary | ICD-10-CM | POA: Diagnosis not present

## 2022-10-13 DIAGNOSIS — M6281 Muscle weakness (generalized): Secondary | ICD-10-CM | POA: Diagnosis not present

## 2022-10-13 DIAGNOSIS — M25561 Pain in right knee: Secondary | ICD-10-CM | POA: Diagnosis not present

## 2022-10-13 DIAGNOSIS — R2689 Other abnormalities of gait and mobility: Secondary | ICD-10-CM | POA: Diagnosis not present

## 2022-10-13 DIAGNOSIS — R293 Abnormal posture: Secondary | ICD-10-CM | POA: Diagnosis not present

## 2022-10-13 DIAGNOSIS — M7050 Other bursitis of knee, unspecified knee: Secondary | ICD-10-CM | POA: Diagnosis not present

## 2022-10-13 DIAGNOSIS — M25652 Stiffness of left hip, not elsewhere classified: Secondary | ICD-10-CM | POA: Diagnosis not present

## 2022-10-19 ENCOUNTER — Encounter: Payer: Medicare Other | Admitting: Psychology

## 2022-10-20 ENCOUNTER — Ambulatory Visit: Payer: Medicare Other | Admitting: Allergy and Immunology

## 2022-10-20 ENCOUNTER — Encounter: Payer: Self-pay | Admitting: Allergy and Immunology

## 2022-10-20 VITALS — BP 128/86 | HR 80 | Resp 10 | Ht 60.3 in | Wt 154.8 lb

## 2022-10-20 DIAGNOSIS — R293 Abnormal posture: Secondary | ICD-10-CM | POA: Diagnosis not present

## 2022-10-20 DIAGNOSIS — K219 Gastro-esophageal reflux disease without esophagitis: Secondary | ICD-10-CM

## 2022-10-20 DIAGNOSIS — M6281 Muscle weakness (generalized): Secondary | ICD-10-CM | POA: Diagnosis not present

## 2022-10-20 DIAGNOSIS — R2689 Other abnormalities of gait and mobility: Secondary | ICD-10-CM | POA: Diagnosis not present

## 2022-10-20 DIAGNOSIS — D849 Immunodeficiency, unspecified: Secondary | ICD-10-CM | POA: Diagnosis not present

## 2022-10-20 DIAGNOSIS — M7050 Other bursitis of knee, unspecified knee: Secondary | ICD-10-CM | POA: Diagnosis not present

## 2022-10-20 DIAGNOSIS — M25561 Pain in right knee: Secondary | ICD-10-CM | POA: Diagnosis not present

## 2022-10-20 DIAGNOSIS — J3089 Other allergic rhinitis: Secondary | ICD-10-CM

## 2022-10-20 DIAGNOSIS — J4530 Mild persistent asthma, uncomplicated: Secondary | ICD-10-CM

## 2022-10-20 DIAGNOSIS — M25652 Stiffness of left hip, not elsewhere classified: Secondary | ICD-10-CM | POA: Diagnosis not present

## 2022-10-20 NOTE — [place] Notes (Signed)
[place] [place] [facility] [place]   Follow-up Note  Referring Provider: Donald Prose, MD Primary Provider: Donald Prose, MD Date of Office Visit: 10/20/2022  Subjective:   Olivia Cruz (DOB: Apr 09, 1951) is a 72 y.o. female who returns to the [facility] and [place] on 10/20/2022 in re-evaluation of the following:  HPI: Saquoya returns to this clinic in evaluation of asthma, allergic rhinitis, LPR, in the context of immunosuppression with Humira for rheumatoid arthritis.  I last saw her in this clinic 21 April 2022.  She has really done well with her airway and has not required a systemic steroid or an antibiotic for any type of airway issue and rarely uses any short acting bronchodilator and has been using Flovent about twice a week and rarely any budesonide.  She has had some musculoskeletal issues with her lower back.  Apparently she had a spine injection and had a CNS reaction to that spine injection.  She continues to treat reflux successfully with a combination of her proton pump inhibitor and H2 receptor blocker.  She did miss her H2 receptor blocker for a week and had a very difficult time with her reflux.  She remains on immunosuppression with Humira.  Interestingly, she has been around people who have been sick with multiple upper respiratory tract infections including her husband on 3 different occasions and she has not contracted these infections.  In addition, she did develop an upper respiratory tract infection 3 days ago with runny nose and cough and slight fatigue that only lasted 48 hours and she is much better at this point.  She did obtain the flu vaccine.  Allergies as of 10/20/2022       Reactions   Codeine Anaphylaxis, Nausea Only   Ambien [zolpidem Tartrate] Other (See Comments)   Memory loss per patient   Cyproheptadine Hcl Other (See Comments)   Confusion   Dilaudid [hydromorphone]    hallucinations   Hydrocodone Other (See  Comments)   anaphylaxis   Lisinopril    Pramipexole Nausea Only   Sulfa Antibiotics Other (See Comments)   hives   Tramadol         Medication List    acetaminophen 500 MG tablet Commonly known as: TYLENOL Take 500 mg by mouth as needed for pain or headache.   albuterol 108 (90 Base) MCG/ACT inhaler Commonly known as: VENTOLIN HFA Inhale 2 puffs into the lungs every 6 (six) hours as needed for wheezing or shortness of breath.   atorvastatin 10 MG tablet Commonly known as: LIPITOR Take 10 mg by mouth daily.   budesonide 32 MCG/ACT nasal spray Commonly known as: RHINOCORT AQUA Place 1 spray into both nostrils daily. Reported on 02/04/2016   Calcium-Vitamin D-Vitamin K [id number] MG-UNT-MCG Chew Chew 1 tablet by mouth daily.   cholecalciferol 25 MCG (1000 UNIT) tablet Commonly known as: VITAMIN D3 Take 1,000 Units by mouth in the morning, at noon, and at bedtime.   diclofenac Sodium 1 % Gel Commonly known as: VOLTAREN Apply 1 Application topically as needed for pain.   DULoxetine 60 MG capsule Commonly known as: Cymbalta Take 1 capsule (60 mg total) by mouth 2 (two) times daily.   famotidine 40 MG tablet Commonly known as: PEPCID TAKE 1 TABLET BY MOUTH EVERYDAY AT BEDTIME   FISH OIL PO Take by mouth daily.   Flovent HFA 110 MCG/ACT inhaler Generic drug: fluticasone Inhale two puffs twice daily to prevent cough or wheeze.  Rinse, gargle,  and spit after use.   folic acid 1 MG tablet Commonly known as: FOLVITE Take by mouth.   gabapentin 300 MG capsule Commonly known as: NEURONTIN Take 300 mg by mouth in the morning, at noon, and at bedtime.   HAIR/SKIN/NAILS PO Take 1 tablet by mouth daily.   Humira (2 Pen) 40 MG/0.4ML Pnkt Generic drug: Adalimumab Inject into the skin. Unsure if dose is 40 mg- every other week   levothyroxine 50 MCG tablet Commonly known as: SYNTHROID TAKE 1 TABLET BY MOUTH DAILY   Magnesium 250 MG Tabs Take 250 mg by mouth  daily.   meloxicam 7.5 MG tablet Commonly known as: MOBIC Take by mouth.   nitroGLYCERIN 0.4 MG SL tablet Commonly known as: NITROSTAT Place 1 tablet (0.4 mg total) under the tongue every 5 (five) minutes as needed for chest pain.   pantoprazole 40 MG tablet Commonly known as: PROTONIX TAKE 1 TABLET BY MOUTH DAILY   valACYclovir 500 MG tablet Commonly known as: VALTREX Take 1 tablet (500 mg total) by mouth 3 (three) times daily as needed (herpes outbreak).    Past Medical History:  Diagnosis Date   Achilles tendonosis 10/06/2020   Acute cystitis with hematuria 09/29/2019   Acute infection of nasal sinus 06/21/2021   Allergic rhinoconjunctivitis    Anxiety    Asthma    Cervical os stenosis 04/09/2015   Chest pain    Chronic back pain 09/23/2021   Chronic knee pain after total replacement of right knee joint 07/21/2020   Chronic right shoulder pain 05/25/2021   Depression    Depression, major, recurrent, mild (HCC)    Diverticulitis    Dysuria 07/12/2021   GERD (gastroesophageal reflux disease)    Glaucoma    Hypercholesterolemia    Hypothyroidism    Insomnia    Laryngopharyngeal reflux (LPR)    Memory changes    Migraine without aura and without status migrainosus, not intractable 05/07/2021   Migraines    Mild recurrent major depression ([place]) 07/12/2021   Mixed hyperlipidemia 12/22/2019   Muscle wasting and atrophy, not elsewhere classified, left hand 12/01/2020   Osteoarthritis    Osteopenia    Postmenopausal atrophic vaginitis 04/09/2015   Prediabetes 12/22/2019   Psoriatic arthritis (HCC)    RLS (restless legs syndrome)    Seasonal allergic rhinitis due to pollen 06/21/2021   Secondary hypothyroidism 12/22/2019   Skin cancer    Spondylolisthesis at L4-L5 level 04/27/2022   Status post total right knee replacement 12/29/2021   STD (sexually transmitted disease)    HSV II   Tendinopathy of right rotator cuff 05/25/2021   Vocal fold atrophy 09/12/2018    Past Surgical  History:  Procedure Laterality Date   BELPHAROPTOSIS REPAIR     BREAST SURGERY Left    times 2   COLONOSCOPY  06/03/2013   Moderate predominantly sigmoid diverticulosis. Small interal hemorroids   FOOT SURGERY Right    Removed bone spur   GANGLION CYST EXCISION Left    foot   HEMORRHOID SURGERY     SKIN CANCER EXCISION     TOTAL KNEE ARTHROPLASTY Right 07/21/2020   UPPER GI ENDOSCOPY  03/23/2016   Mild gastritis, gastric polyps bxbenign squamous mucosa with no abnormaility, fundic glad poly in setting of mild chron gastritis.    Review of systems negative except as noted in HPI / PMHx or noted below:  Review of Systems  Constitutional: Negative.   HENT: Negative.    Eyes: Negative.   Respiratory: Negative.  Cardiovascular: Negative.   Gastrointestinal: Negative.   Genitourinary: Negative.   Musculoskeletal: Negative.   Skin: Negative.   Neurological: Negative.   Endo/Heme/Allergies: Negative.   Psychiatric/Behavioral: Negative.       Objective:   Vitals:   10/20/22 1034  BP: 128/86  Pulse: 80  Resp: 10  SpO2: 96%   Height: 5' 0.3" (153.2 cm)  Weight: 154 lb 12.8 oz (70.2 kg)   Physical Exam Constitutional:      Appearance: She is not diaphoretic.  HENT:     Head: Normocephalic.     Right Ear: Tympanic membrane, ear canal and external ear normal.     Left Ear: Tympanic membrane, ear canal and external ear normal.     Nose: Nose normal. No mucosal edema or rhinorrhea.     Mouth/Throat:     Pharynx: Uvula midline. No oropharyngeal exudate.  Eyes:     Conjunctiva/sclera: Conjunctivae normal.  Neck:     Thyroid: No thyromegaly.     Trachea: Trachea normal. No tracheal tenderness or tracheal deviation.  Cardiovascular:     Rate and Rhythm: Normal rate and regular rhythm.     Heart sounds: Normal heart sounds, S1 normal and S2 normal. No murmur heard. Pulmonary:     Effort: No respiratory distress.     Breath sounds: Normal breath sounds. No stridor. No  wheezing or rales.  Lymphadenopathy:     Head:     Right side of head: No tonsillar adenopathy.     Left side of head: No tonsillar adenopathy.     Cervical: No cervical adenopathy.  Skin:    Findings: No erythema or rash.     Nails: There is no clubbing.  Neurological:     Mental Status: She is alert.     Diagnostics:    Spirometry was performed and demonstrated an FEV1 of 2.17 at 115 % of predicted.  Assessment and Plan:   1. Asthma, well controlled, mild persistent   2. Perennial allergic rhinitis   3. LPRD (laryngopharyngeal reflux disease)   4. Immunosuppression ([place])    1.  Treat and prevent inflammation:  A. Flovent 110 - 2 inhalations 3-7 time per week w/spacer (empty lungs) B. Budesonide - 2 sprays each nostril 1-7 times per week  2.  Treat and prevent reflux/LPR:  A. Pantoprazole 40 mg - 1 tablet 1 time per day B. Famotidine 40 mg - 1 tablet in evening  3.  If needed:  A. Nasal saline B. OTC Cetirizine 10 mg - 1 tablet 1 time per day C. Albuterol HFA - 2 inhalations every 4-6 hours  4.  Return in 6 months or earlier if problem.    Jkayla appears to be doing quite well regarding her respiratory tract issue and her reflux issue on her current plan of using anti-inflammatory agents for both her upper and lower airway at relatively low dose and a combination of a proton pump inhibitor and H2 receptor blocker to address her reflux.  She continues on Humira and it is very interesting to note that she has not contracted any type of significant respiratory tract infection over the course of the past 6 months even while our community suffered significantly with multiple episodes of COVID and RSV and flu and other respiratory tract infections.  She takes no precautions such as wearing a mask when she is out in public.  She did contract this recent upper respiratory tract infection from her husband who has been sick on at least  3 different occasions during this winter and  her infection only lasted 4 days.  It does question whether or not she is truly being immunosuppressed.  She informs me that she does have some problems with her joints especially as she approaches the nadir of her Humira injections and may be she could be tried on a higher dose of Humira to suppress her immune system a little more than currently suppressed to address her continued arthritic complaints.  Obviously we are not going to make that decision and that is a discussion she will have with her rheumatologist.  Allena Katz, MD Allergy / [place]

## 2022-10-20 NOTE — Patient Instructions (Addendum)
  1.  Treat and prevent inflammation:  A. Flovent 110 - 2 inhalations 3-7 time per week w/spacer (empty lungs) B. Budesonide - 2 sprays each nostril 1-7 times per week  2.  Treat and prevent reflux/LPR:  A. Pantoprazole 40 mg - 1 tablet 1 time per day B. Famotidine 40 mg - 1 tablet in evening  3.  If needed:  A. Nasal saline B. OTC Cetirizine 10 mg - 1 tablet 1 time per day C. Albuterol HFA - 2 inhalations every 4-6 hours  4.  Return in 6 months or earlier if problem.

## 2022-10-24 ENCOUNTER — Encounter: Payer: Medicare Other | Admitting: Psychology

## 2022-10-24 ENCOUNTER — Encounter: Payer: Self-pay | Admitting: Allergy and Immunology

## 2022-10-24 DIAGNOSIS — M25651 Stiffness of right hip, not elsewhere classified: Secondary | ICD-10-CM | POA: Diagnosis not present

## 2022-10-24 DIAGNOSIS — M2560 Stiffness of unspecified joint, not elsewhere classified: Secondary | ICD-10-CM | POA: Diagnosis not present

## 2022-10-24 DIAGNOSIS — M7050 Other bursitis of knee, unspecified knee: Secondary | ICD-10-CM | POA: Diagnosis not present

## 2022-10-24 DIAGNOSIS — M25562 Pain in left knee: Secondary | ICD-10-CM | POA: Diagnosis not present

## 2022-10-24 DIAGNOSIS — M25652 Stiffness of left hip, not elsewhere classified: Secondary | ICD-10-CM | POA: Diagnosis not present

## 2022-10-24 DIAGNOSIS — M25561 Pain in right knee: Secondary | ICD-10-CM | POA: Diagnosis not present

## 2022-10-24 DIAGNOSIS — R293 Abnormal posture: Secondary | ICD-10-CM | POA: Diagnosis not present

## 2022-10-24 DIAGNOSIS — M6281 Muscle weakness (generalized): Secondary | ICD-10-CM | POA: Diagnosis not present

## 2022-10-24 DIAGNOSIS — R2689 Other abnormalities of gait and mobility: Secondary | ICD-10-CM | POA: Diagnosis not present

## 2022-10-25 DIAGNOSIS — E039 Hypothyroidism, unspecified: Secondary | ICD-10-CM | POA: Diagnosis not present

## 2022-10-25 DIAGNOSIS — E7800 Pure hypercholesterolemia, unspecified: Secondary | ICD-10-CM | POA: Diagnosis not present

## 2022-10-27 DIAGNOSIS — M5489 Other dorsalgia: Secondary | ICD-10-CM | POA: Diagnosis not present

## 2022-10-27 DIAGNOSIS — M25572 Pain in left ankle and joints of left foot: Secondary | ICD-10-CM | POA: Diagnosis not present

## 2022-10-27 DIAGNOSIS — Z79899 Other long term (current) drug therapy: Secondary | ICD-10-CM | POA: Diagnosis not present

## 2022-10-27 DIAGNOSIS — L4059 Other psoriatic arthropathy: Secondary | ICD-10-CM | POA: Diagnosis not present

## 2022-10-27 DIAGNOSIS — M79604 Pain in right leg: Secondary | ICD-10-CM | POA: Diagnosis not present

## 2022-10-27 DIAGNOSIS — M79605 Pain in left leg: Secondary | ICD-10-CM | POA: Diagnosis not present

## 2022-10-27 DIAGNOSIS — M79606 Pain in leg, unspecified: Secondary | ICD-10-CM | POA: Diagnosis not present

## 2022-11-01 ENCOUNTER — Other Ambulatory Visit: Payer: Self-pay | Admitting: Allergy and Immunology

## 2022-11-01 DIAGNOSIS — D84821 Immunodeficiency due to drugs: Secondary | ICD-10-CM | POA: Diagnosis not present

## 2022-11-01 DIAGNOSIS — J209 Acute bronchitis, unspecified: Secondary | ICD-10-CM | POA: Diagnosis not present

## 2022-11-01 DIAGNOSIS — L4050 Arthropathic psoriasis, unspecified: Secondary | ICD-10-CM | POA: Diagnosis not present

## 2022-11-02 ENCOUNTER — Other Ambulatory Visit: Payer: Self-pay | Admitting: Family Medicine

## 2022-11-07 ENCOUNTER — Ambulatory Visit: Payer: Medicare Other | Admitting: Physician Assistant

## 2022-11-09 ENCOUNTER — Telehealth: Payer: Self-pay | Admitting: Allergy and Immunology

## 2022-11-09 NOTE — Telephone Encounter (Signed)
Patient states she has not received her Flovent and [facility] informed her it is because they are missing information from [place]. Patient would like know if we are able to call [facility] and see what is going on.

## 2022-11-10 DIAGNOSIS — M6281 Muscle weakness (generalized): Secondary | ICD-10-CM | POA: Diagnosis not present

## 2022-11-10 DIAGNOSIS — R293 Abnormal posture: Secondary | ICD-10-CM | POA: Diagnosis not present

## 2022-11-10 DIAGNOSIS — M25652 Stiffness of left hip, not elsewhere classified: Secondary | ICD-10-CM | POA: Diagnosis not present

## 2022-11-10 DIAGNOSIS — M25561 Pain in right knee: Secondary | ICD-10-CM | POA: Diagnosis not present

## 2022-11-10 DIAGNOSIS — M7050 Other bursitis of knee, unspecified knee: Secondary | ICD-10-CM | POA: Diagnosis not present

## 2022-11-10 DIAGNOSIS — M25562 Pain in left knee: Secondary | ICD-10-CM | POA: Diagnosis not present

## 2022-11-10 DIAGNOSIS — M2560 Stiffness of unspecified joint, not elsewhere classified: Secondary | ICD-10-CM | POA: Diagnosis not present

## 2022-11-10 DIAGNOSIS — R2689 Other abnormalities of gait and mobility: Secondary | ICD-10-CM | POA: Diagnosis not present

## 2022-11-10 DIAGNOSIS — M25651 Stiffness of right hip, not elsewhere classified: Secondary | ICD-10-CM | POA: Diagnosis not present

## 2022-11-10 MED ORDER — ARNUITY ELLIPTA 200 MCG/ACT IN AEPB: INHALATION_SPRAY | RESPIRATORY_TRACT | 4 refills | Status: DC

## 2022-11-10 NOTE — Telephone Encounter (Signed)
Left detailed message on voicemail informing patient of change in medication.  [facility] sent to [facility].  I did leave our phone number to call if she has any questions.

## 2022-11-10 NOTE — Telephone Encounter (Signed)
Called pharmacy and they said it looks like generic Flovent is not preferred, alternatives include Arnuity and Qvar.  Please advise.

## 2022-11-14 DIAGNOSIS — M25652 Stiffness of left hip, not elsewhere classified: Secondary | ICD-10-CM | POA: Diagnosis not present

## 2022-11-14 DIAGNOSIS — M7050 Other bursitis of knee, unspecified knee: Secondary | ICD-10-CM | POA: Diagnosis not present

## 2022-11-14 DIAGNOSIS — M25562 Pain in left knee: Secondary | ICD-10-CM | POA: Diagnosis not present

## 2022-11-14 DIAGNOSIS — M6281 Muscle weakness (generalized): Secondary | ICD-10-CM | POA: Diagnosis not present

## 2022-11-14 DIAGNOSIS — M25651 Stiffness of right hip, not elsewhere classified: Secondary | ICD-10-CM | POA: Diagnosis not present

## 2022-11-14 DIAGNOSIS — R2689 Other abnormalities of gait and mobility: Secondary | ICD-10-CM | POA: Diagnosis not present

## 2022-11-14 DIAGNOSIS — R293 Abnormal posture: Secondary | ICD-10-CM | POA: Diagnosis not present

## 2022-11-14 DIAGNOSIS — M25561 Pain in right knee: Secondary | ICD-10-CM | POA: Diagnosis not present

## 2022-11-14 DIAGNOSIS — M2560 Stiffness of unspecified joint, not elsewhere classified: Secondary | ICD-10-CM | POA: Diagnosis not present

## 2022-11-15 ENCOUNTER — Ambulatory Visit: Payer: Medicare Other | Admitting: Physician Assistant

## 2022-11-17 DIAGNOSIS — M4316 Spondylolisthesis, lumbar region: Secondary | ICD-10-CM | POA: Diagnosis not present

## 2022-11-22 ENCOUNTER — Telehealth: Payer: Self-pay

## 2022-11-22 NOTE — Telephone Encounter (Signed)
   Pre-operative Risk Assessment    Patient Name: Olivia Cruz  DOB: 1950-12-30 MRN: [id number]      Request for Surgical Clearance    Procedure:   L4-5 Posterior Lumbar Interbody Fusion  Date of Surgery:  Clearance TBD                                 Surgeon:  Kristeen Miss, MD  Surgeon's Group or Practice Name:  [facility] Phone number:  [phone] Fax number:  [phone]   Type of Clearance Requested:   - Medical    Type of Anesthesia:  General    Additional requests/questions:  Please fax a copy of signed pre-operative clearance request form  to the surgeon's office.  Signed, Toni Arthurs   11/22/2022, 2:53 PM

## 2022-11-22 NOTE — Telephone Encounter (Signed)
Left message on machine for pt to contact the office.   

## 2022-11-22 NOTE — Telephone Encounter (Signed)
   Name: Olivia Cruz  DOB: Oct 25, 1950  MRN: [id number]  Primary Cardiologist: None   Preoperative team, please contact this patient and set up a phone call appointment for further preoperative risk assessment. Please obtain consent and complete medication review. Thank you for your help.  I confirm that guidance regarding antiplatelet and oral anticoagulation therapy has been completed and, if necessary, noted below.   Mable Fill, Marissa Nestle, NP 11/22/2022, 3:22 PM [place]

## 2022-11-23 ENCOUNTER — Telehealth: Payer: Self-pay | Admitting: *Deleted

## 2022-11-23 NOTE — Telephone Encounter (Signed)
Pt has been scheduled for tele pre op appt 11/29/22 @ 2 pm. Med rec and consent are done.     Patient Consent for Virtual Visit        PIEPER ROLLING has provided verbal consent on 11/23/2022 for a virtual visit (video or telephone).   CONSENT FOR VIRTUAL VISIT FOR:  Olivia Cruz  By participating in this virtual visit I agree to the following:  I hereby voluntarily request, consent and authorize [facility] and its employed or contracted physicians, physician assistants, nurse practitioners or other licensed health care professionals (the Practitioner), to provide me with telemedicine health care services (the "Services") as deemed necessary by the treating Practitioner. I acknowledge and consent to receive the Services by the Practitioner via telemedicine. I understand that the telemedicine visit will involve communicating with the Practitioner through live audiovisual communication technology and the disclosure of certain medical information by electronic transmission. I acknowledge that I have been given the opportunity to request an in-person assessment or other available alternative prior to the telemedicine visit and am voluntarily participating in the telemedicine visit.  I understand that I have the right to withhold or withdraw my consent to the use of telemedicine in the course of my care at any time, without affecting my right to future care or treatment, and that the Practitioner or I may terminate the telemedicine visit at any time. I understand that I have the right to inspect all information obtained and/or recorded in the course of the telemedicine visit and may receive copies of available information for a reasonable fee.  I understand that some of the potential risks of receiving the Services via telemedicine include:  Delay or interruption in medical evaluation due to technological equipment failure or disruption; Information transmitted may not be sufficient  (e.g. poor resolution of images) to allow for appropriate medical decision making by the Practitioner; and/or  In rare instances, security protocols could fail, causing a breach of personal health information.  Furthermore, I acknowledge that it is my responsibility to provide information about my medical history, conditions and care that is complete and accurate to the best of my ability. I acknowledge that Practitioner's advice, recommendations, and/or decision may be based on factors not within their control, such as incomplete or inaccurate data provided by me or distortions of diagnostic images or specimens that may result from electronic transmissions. I understand that the practice of medicine is not an exact science and that Practitioner makes no warranties or guarantees regarding treatment outcomes. I acknowledge that a copy of this consent can be made available to me via my patient portal ([facility]), or I can request a printed copy by calling the office of [facility].    I understand that my insurance will be billed for this visit.   I have read or had this consent read to me. I understand the contents of this consent, which adequately explains the benefits and risks of the Services being provided via telemedicine.  I have been provided ample opportunity to ask questions regarding this consent and the Services and have had my questions answered to my satisfaction. I give my informed consent for the services to be provided through the use of telemedicine in my medical care

## 2022-11-23 NOTE — Telephone Encounter (Signed)
Primary card is Dr. Geraldo Pitter.  Pt has been scheduled for tele pre op appt 11/29/22 @ 2 pm. Med rec and consent are done.

## 2022-11-24 ENCOUNTER — Encounter: Payer: Self-pay | Admitting: Allergy and Immunology

## 2022-11-24 ENCOUNTER — Other Ambulatory Visit: Payer: Self-pay

## 2022-11-24 MED ORDER — BUDESONIDE 32 MCG/ACT NA SUSP: NASAL | 1 refills | Status: DC

## 2022-11-28 DIAGNOSIS — R293 Abnormal posture: Secondary | ICD-10-CM | POA: Diagnosis not present

## 2022-11-28 DIAGNOSIS — M25562 Pain in left knee: Secondary | ICD-10-CM | POA: Diagnosis not present

## 2022-11-28 DIAGNOSIS — M25561 Pain in right knee: Secondary | ICD-10-CM | POA: Diagnosis not present

## 2022-11-28 DIAGNOSIS — M6281 Muscle weakness (generalized): Secondary | ICD-10-CM | POA: Diagnosis not present

## 2022-11-28 DIAGNOSIS — R2689 Other abnormalities of gait and mobility: Secondary | ICD-10-CM | POA: Diagnosis not present

## 2022-11-28 DIAGNOSIS — M7050 Other bursitis of knee, unspecified knee: Secondary | ICD-10-CM | POA: Diagnosis not present

## 2022-11-28 DIAGNOSIS — M25652 Stiffness of left hip, not elsewhere classified: Secondary | ICD-10-CM | POA: Diagnosis not present

## 2022-11-28 DIAGNOSIS — M25651 Stiffness of right hip, not elsewhere classified: Secondary | ICD-10-CM | POA: Diagnosis not present

## 2022-11-28 DIAGNOSIS — M2560 Stiffness of unspecified joint, not elsewhere classified: Secondary | ICD-10-CM | POA: Diagnosis not present

## 2022-11-29 ENCOUNTER — Ambulatory Visit: Payer: Medicare Other | Attending: Cardiovascular Disease

## 2022-11-29 DIAGNOSIS — Z01810 Encounter for preprocedural cardiovascular examination: Secondary | ICD-10-CM | POA: Diagnosis not present

## 2022-11-29 NOTE — Progress Notes (Signed)
Virtual Visit via Telephone Note   Because of Olivia Cruz's co-morbid illnesses, she is at least at moderate risk for complications without adequate follow up.  This format is felt to be most appropriate for this patient at this time.  The patient did not have access to video technology/had technical difficulties with video requiring transitioning to audio format only (telephone).  All issues noted in this document were discussed and addressed.  No physical exam could be performed with this format.  Please refer to the patient's chart for her consent to telehealth for [facility].  Evaluation Performed:  Preoperative cardiovascular risk assessment _____________   Date:  11/29/2022   Patient ID:  Olivia Cruz, DOB [birth year], MRN [id number] Patient Location:  Home Provider location:   Office  Primary Care Provider:  Deatra James, MD Primary Cardiologist:  None  Chief Complaint / Patient Profile   72 y.o. y/o female with a h/o hyperlipidemia, chest discomfort of uncertain etiology, cardiac murmur who is pending L4-5 posterior lumbar fusion and presents today for telephonic preoperative cardiovascular risk assessment.  History of Present Illness    Olivia Cruz is a 72 y.o. female who presents via audio/video conferencing for a telehealth visit today.  Pt was last seen in cardiology clinic on 05/04/2022 by Dr. Tomie China.  At that time Olivia Cruz was doing well .  The patient is now pending procedure as outlined above. Since her last visit, she remained stable from a cardiac standpoint.  Today she denies chest pain, shortness of breath, lower extremity edema, fatigue, palpitations, melena, hematuria, hemoptysis, diaphoresis, weakness, presyncope, syncope, orthopnea, and PND.   Past Medical History    Past Medical History:  Diagnosis Date   Achilles tendonosis 10/06/2020   Acute cystitis with hematuria 09/29/2019   Acute infection of nasal sinus  06/21/2021   Allergic rhinoconjunctivitis    Anxiety    Asthma    Cervical os stenosis 04/09/2015   Chest pain    Chronic back pain 09/23/2021   Chronic knee pain after total replacement of right knee joint 07/21/2020   Chronic right shoulder pain 05/25/2021   Depression    Depression, major, recurrent, mild (HCC)    Diverticulitis    Dysuria 07/12/2021   GERD (gastroesophageal reflux disease)    Glaucoma    Hypercholesterolemia    Hypothyroidism    Insomnia    Laryngopharyngeal reflux (LPR)    Memory changes    Migraine without aura and without status migrainosus, not intractable 05/07/2021   Migraines    Mild recurrent major depression (HCC) 07/12/2021   Mixed hyperlipidemia 12/22/2019   Muscle wasting and atrophy, not elsewhere classified, left hand 12/01/2020   Osteoarthritis    Osteopenia    Postmenopausal atrophic vaginitis 04/09/2015   Prediabetes 12/22/2019   Psoriatic arthritis (HCC)    RLS (restless legs syndrome)    Seasonal allergic rhinitis due to pollen 06/21/2021   Secondary hypothyroidism 12/22/2019   Skin cancer    Spondylolisthesis at L4-L5 level 04/27/2022   Status post total right knee replacement 12/29/2021   STD (sexually transmitted disease)    HSV II   Tendinopathy of right rotator cuff 05/25/2021   Vocal fold atrophy 09/12/2018   Past Surgical History:  Procedure Laterality Date   BELPHAROPTOSIS REPAIR     BREAST SURGERY Left    times 2   COLONOSCOPY  06/03/2013   Moderate predominantly sigmoid diverticulosis. Small interal hemorroids   FOOT SURGERY Right  Removed bone spur   GANGLION CYST EXCISION Left    foot   HEMORRHOID SURGERY     SKIN CANCER EXCISION     TOTAL KNEE ARTHROPLASTY Right 07/21/2020   UPPER GI ENDOSCOPY  03/23/2016   Mild gastritis, gastric polyps bxbenign squamous mucosa with no abnormaility, fundic glad poly in setting of mild chron gastritis.    Allergies  Allergies  Allergen Reactions   Codeine Anaphylaxis and Nausea Only    Ambien [Zolpidem Tartrate] Other (See Comments)    Memory loss per patient   Cyproheptadine Hcl Other (See Comments)    Confusion   Dilaudid [Hydromorphone]     hallucinations   Hydrocodone Other (See Comments)    anaphylaxis   Lisinopril    Pramipexole Nausea Only   Sulfa Antibiotics Other (See Comments)    hives   Tramadol     Home Medications    Prior to Admission medications   Medication Sig Start Date End Date Taking? Authorizing Provider  acetaminophen (TYLENOL) 500 MG tablet Take 500 mg by mouth as needed for pain or headache.    [provider]  Adalimumab (HUMIRA PEN) 40 MG/0.4ML PNKT Inject into the skin. Unsure if dose is 40 mg- every other week    [provider]  albuterol (VENTOLIN HFA) 108 (90 Base) MCG/ACT inhaler Inhale 2 puffs into the lungs every 6 (six) hours as needed for wheezing or shortness of breath. 03/08/21   Cox, Fritzi Mandes, MD  ARNUITY ELLIPTA 200 MCG/ACT AEPB Inhale one dose once daily to prevent cough or wheeze.  Rinse, gargle, and spit after use. Patient not taking: Reported on 11/23/2022 11/10/22   Jessica Priest, MD  atorvastatin (LIPITOR) 10 MG tablet Take 10 mg by mouth daily. Patient not taking: Reported on 11/23/2022 04/13/22   [provider]  Biotin w/ Vitamins C & E (HAIR/SKIN/NAILS PO) Take 1 tablet by mouth daily.    [provider]  budesonide (RHINOCORT AQUA) 32 MCG/ACT nasal spray Place 1 spray into both nostrils daily. Reported on 02/04/2016 06/21/21   Blane Ohara, MD  budesonide (RHINOCORT AQUA) 32 MCG/ACT nasal spray Use 2 sprays each nostril 1-7 times per week as directed. 11/24/22   Kozlow, Alvira Philips, MD  Calcium-Vitamin D-Vitamin K [phone] MG-UNT-MCG CHEW Chew 1 tablet by mouth daily.    [provider]  cetirizine (ZYRTEC) 10 MG tablet Take 10 mg by mouth daily.    [provider]  cholecalciferol (VITAMIN D3) 25 MCG (1000 UNIT) tablet Take 1,000 Units by mouth daily.    [provider]  clindamycin (CLEOCIN) 300 MG capsule Take 300 mg by mouth as needed. AS NEEDED FOR DENTAL PROCEDURE Patient not taking: Reported on 11/23/2022    [provider]  diclofenac Sodium (VOLTAREN) 1 % GEL Apply 1 Application topically as needed for pain. 10/06/20   [provider]  DULoxetine (CYMBALTA) 60 MG capsule Take 1 capsule (60 mg total) by mouth 2 (two) times daily. 09/23/21   Blane Ohara, MD  famotidine (PEPCID) 40 MG tablet TAKE 1 TABLET BY MOUTH EVERYDAY AT BEDTIME 09/19/22   Kozlow, Alvira Philips, MD  folic acid (FOLVITE) 1 MG tablet Take by mouth. 05/23/22   [provider]  gabapentin (NEURONTIN) 300 MG capsule Take 300 mg by mouth in the morning, at noon, and at bedtime. 11/09/20   [provider]  levothyroxine (SYNTHROID) 50 MCG tablet TAKE 1 TABLET BY MOUTH DAILY 10/18/21   Blane Ohara, MD  Magnesium 250  MG TABS Take 250 mg by mouth daily.    [provider]  meloxicam (MOBIC) 7.5 MG tablet Take by mouth. Patient not taking: Reported on 11/23/2022 03/31/22   [provider]  Multiple Vitamin (MULTIVITAMIN WITH MINERALS) TABS tablet Take 1 tablet by mouth daily.    [provider]  nitrofurantoin (MACRODANTIN) 100 MG capsule Take 100 mg by mouth as needed. Patient not taking: Reported on 11/23/2022 09/12/18   [provider]  nitroGLYCERIN (NITROSTAT) 0.4 MG SL tablet Place 1 tablet (0.4 mg total) under the tongue every 5 (five) minutes as needed for chest pain. Patient not taking: Reported on 11/23/2022 05/04/22   Revankar, Aundra Dubinajan R, MD  Omega-3 Fatty Acids (FISH OIL PO) Take by mouth daily.    [provider]  pantoprazole (PROTONIX) 40 MG tablet TAKE 1 TABLET BY MOUTH DAILY 10/18/21   Cox, Kirsten, MD  valACYclovir (VALTREX) 500 MG tablet Take 1 tablet (500 mg total) by mouth 3 (three) times daily as needed (herpes outbreak). 07/12/21   Blane Oharaox, Kirsten, MD    Physical Exam    Vital Signs:  Tawnya CrookMarlene B Mottern  does not have vital signs available for review today.  Given telephonic nature of communication, physical exam is limited. AAOx3. NAD. Normal affect.  Speech and respirations are unlabored.  Accessory Clinical Findings    None  Assessment & Plan    1.  Preoperative Cardiovascular Risk Assessment:L4-5 Posterior Lumbar Interbody Fusion,[facility], Barnett AbuHenry Elsner, MD       Primary Cardiologist: None  Chart reviewed as part of pre-operative protocol coverage. Given past medical history and time since last visit, based on ACC/AHA guidelines, Graciela HusbandsMarlene B Behringer would be at acceptable risk for the planned procedure without further cardiovascular testing.   Her RCRI is a class II risk, 0.9% risk of major cardiac event.  She is able to complete greater than 4 METS of physical activity.  Patient was advised that if she develops new symptoms prior to surgery to contact our office to arrange a follow-up appointment.  She verbalized understanding.  I will route this recommendation to the requesting party via Epic fax function and remove from pre-op pool.      Time:   Today, I have spent 5 minutes with the patient with telehealth technology discussing medical history, symptoms, and management plan.  Prior to her phone evaluation I spent greater than 10 minutes reviewing her past medical history and cardiac medications.   Ronney AstersJesse M Kinsley Holderman, NP  11/29/2022, 7:43 AM

## 2022-12-05 DIAGNOSIS — R2689 Other abnormalities of gait and mobility: Secondary | ICD-10-CM | POA: Diagnosis not present

## 2022-12-05 DIAGNOSIS — M2560 Stiffness of unspecified joint, not elsewhere classified: Secondary | ICD-10-CM | POA: Diagnosis not present

## 2022-12-05 DIAGNOSIS — M25562 Pain in left knee: Secondary | ICD-10-CM | POA: Diagnosis not present

## 2022-12-05 DIAGNOSIS — M25652 Stiffness of left hip, not elsewhere classified: Secondary | ICD-10-CM | POA: Diagnosis not present

## 2022-12-05 DIAGNOSIS — R293 Abnormal posture: Secondary | ICD-10-CM | POA: Diagnosis not present

## 2022-12-05 DIAGNOSIS — M25561 Pain in right knee: Secondary | ICD-10-CM | POA: Diagnosis not present

## 2022-12-05 DIAGNOSIS — M25651 Stiffness of right hip, not elsewhere classified: Secondary | ICD-10-CM | POA: Diagnosis not present

## 2022-12-05 DIAGNOSIS — M7050 Other bursitis of knee, unspecified knee: Secondary | ICD-10-CM | POA: Diagnosis not present

## 2022-12-05 DIAGNOSIS — M6281 Muscle weakness (generalized): Secondary | ICD-10-CM | POA: Diagnosis not present

## 2022-12-07 ENCOUNTER — Other Ambulatory Visit: Payer: Self-pay | Admitting: Allergy and Immunology

## 2022-12-08 DIAGNOSIS — M1712 Unilateral primary osteoarthritis, left knee: Secondary | ICD-10-CM | POA: Diagnosis not present

## 2022-12-09 DIAGNOSIS — M4316 Spondylolisthesis, lumbar region: Secondary | ICD-10-CM | POA: Diagnosis not present

## 2022-12-09 DIAGNOSIS — R296 Repeated falls: Secondary | ICD-10-CM | POA: Diagnosis not present

## 2022-12-09 DIAGNOSIS — Z9989 Dependence on other enabling machines and devices: Secondary | ICD-10-CM | POA: Diagnosis not present

## 2022-12-12 DIAGNOSIS — L4059 Other psoriatic arthropathy: Secondary | ICD-10-CM | POA: Diagnosis not present

## 2022-12-12 DIAGNOSIS — M25562 Pain in left knee: Secondary | ICD-10-CM | POA: Diagnosis not present

## 2022-12-12 DIAGNOSIS — M25561 Pain in right knee: Secondary | ICD-10-CM | POA: Diagnosis not present

## 2022-12-12 DIAGNOSIS — M5489 Other dorsalgia: Secondary | ICD-10-CM | POA: Diagnosis not present

## 2022-12-12 DIAGNOSIS — Z79899 Other long term (current) drug therapy: Secondary | ICD-10-CM | POA: Diagnosis not present

## 2022-12-14 DIAGNOSIS — L570 Actinic keratosis: Secondary | ICD-10-CM | POA: Diagnosis not present

## 2022-12-14 DIAGNOSIS — B351 Tinea unguium: Secondary | ICD-10-CM | POA: Diagnosis not present

## 2022-12-14 DIAGNOSIS — L821 Other seborrheic keratosis: Secondary | ICD-10-CM | POA: Diagnosis not present

## 2022-12-19 DIAGNOSIS — M6281 Muscle weakness (generalized): Secondary | ICD-10-CM | POA: Diagnosis not present

## 2022-12-19 DIAGNOSIS — M7050 Other bursitis of knee, unspecified knee: Secondary | ICD-10-CM | POA: Diagnosis not present

## 2022-12-19 DIAGNOSIS — M25651 Stiffness of right hip, not elsewhere classified: Secondary | ICD-10-CM | POA: Diagnosis not present

## 2022-12-19 DIAGNOSIS — R293 Abnormal posture: Secondary | ICD-10-CM | POA: Diagnosis not present

## 2022-12-19 DIAGNOSIS — M2560 Stiffness of unspecified joint, not elsewhere classified: Secondary | ICD-10-CM | POA: Diagnosis not present

## 2022-12-19 DIAGNOSIS — M25561 Pain in right knee: Secondary | ICD-10-CM | POA: Diagnosis not present

## 2022-12-19 DIAGNOSIS — M25652 Stiffness of left hip, not elsewhere classified: Secondary | ICD-10-CM | POA: Diagnosis not present

## 2022-12-19 DIAGNOSIS — M25562 Pain in left knee: Secondary | ICD-10-CM | POA: Diagnosis not present

## 2022-12-19 DIAGNOSIS — R2689 Other abnormalities of gait and mobility: Secondary | ICD-10-CM | POA: Diagnosis not present

## 2022-12-21 ENCOUNTER — Other Ambulatory Visit: Payer: Self-pay | Admitting: Neurological Surgery

## 2022-12-30 DIAGNOSIS — R748 Abnormal levels of other serum enzymes: Secondary | ICD-10-CM | POA: Diagnosis not present

## 2022-12-30 DIAGNOSIS — M179 Osteoarthritis of knee, unspecified: Secondary | ICD-10-CM | POA: Diagnosis not present

## 2022-12-30 DIAGNOSIS — M898X6 Other specified disorders of bone, lower leg: Secondary | ICD-10-CM | POA: Diagnosis not present

## 2022-12-30 DIAGNOSIS — Z96651 Presence of right artificial knee joint: Secondary | ICD-10-CM | POA: Diagnosis not present

## 2023-01-05 ENCOUNTER — Other Ambulatory Visit: Payer: Self-pay | Admitting: Neurological Surgery

## 2023-01-26 ENCOUNTER — Encounter (HOSPITAL_COMMUNITY): Payer: Self-pay | Admitting: Neurological Surgery

## 2023-01-27 ENCOUNTER — Encounter (HOSPITAL_COMMUNITY): Payer: Self-pay | Admitting: Neurological Surgery

## 2023-01-27 NOTE — Progress Notes (Signed)
Patient is on vacation in [place] and will not return until time for surgery per Jabier Mutton note in Communication section.  Chest x-ray - 03/19/21 CE EKG - 05/04/22 Stress Test - 05/12/22 ECHO - 05/17/22 Cardiac Cath - 10/05/15  ICD Pacemaker/Loop - n/a  Sleep Study -  n/a CPAP - none  Diabetes Type - n/a  NPO  Anesthesia review: Yes   STOP now taking any Aspirin (unless otherwise instructed by your surgeon), Voltaren, Aleve, Mobic, Naproxen, Ibuprofen, Motrin, Advil, Goody's, BC's, all herbal medications, fish oil, and all vitamins.

## 2023-01-27 NOTE — Anesthesia Preprocedure Evaluation (Signed)
Anesthesia Evaluation  Patient identified by MRN, date of birth, ID band Patient awake    Reviewed: Allergy & Precautions, NPO status , Patient's Chart, lab work & pertinent test results  History of Anesthesia Complications Negative for: history of anesthetic complications  Airway Mallampati: IV  TM Distance: >3 FB Neck ROM: Full    Dental  (+) Dental Advisory Given   Pulmonary COPD,  COPD inhaler, former smoker   breath sounds clear to auscultation       Cardiovascular (-) hypertension(-) angina  Rhythm:Regular Rate:Normal  '23 ECHO:  1. Left ventricular EF 55-60%. The LV has normal function, no regional wall motion abnormalities. There is mild concentric LVH. Grade I diastolic dysfunction (impaired relaxation). The average left ventricular global longitudinal strain is -15.7 %.  2. Right ventricular systolic function is normal. The right ventricular size is normal.  3. The mitral valve is normal in structure. Trivial MR. No evidence of mitral stenosis.  4. The aortic valve is normal in structure. Aortic valve regurgitation is not visualized.  '23 Stress: The study is normal. The study is low risk. Left ventricular function is normal. Nuclear stress EF: 76 %. The left ventricular ejection fraction is hyperdynamic (>65%). End diastolic cavity size is normal    Neuro/Psych  Headaches  Anxiety Depression       GI/Hepatic Neg liver ROS,GERD  Medicated and Controlled,,  Endo/Other  Hypothyroidism    Renal/GU negative Renal ROS     Musculoskeletal   Abdominal   Peds  Hematology negative hematology ROS (+)   Anesthesia Other Findings   Reproductive/Obstetrics                             Anesthesia Physical Anesthesia Plan  ASA: 3  Anesthesia Plan: General   Post-op Pain Management: Tylenol PO (pre-op)*   Induction: Intravenous  PONV Risk Score and Plan: 3 and Ondansetron, Dexamethasone  and Treatment may vary due to age or medical condition  Airway Management Planned: Oral ETT and Video Laryngoscope Planned  Additional Equipment: None  Intra-op Plan:   Post-operative Plan: Extubation in OR  Informed Consent: I have reviewed the patients History and Physical, chart, labs and discussed the procedure including the risks, benefits and alternatives for the proposed anesthesia with the patient or authorized representative who has indicated his/her understanding and acceptance.     Dental advisory given  Plan Discussed with: CRNA and Surgeon  Anesthesia Plan Comments: (PAT note written 01/27/2023 by Shonna Chock, PA-C.  )       Anesthesia Quick Evaluation

## 2023-01-27 NOTE — Progress Notes (Signed)
Anesthesia Chart Review:  Case: [id number] Date/Time: 01/30/23 0715   Procedure: L4-5 PLIF (Back)   Anesthesia type: General   Pre-op diagnosis: Spondylolisthesis   Location: [facility] ROOM 18 / [facility]   Surgeons: Barnett Abu, MD       DISCUSSION: Patient is a 72 year old female scheduled for the above procedure.  History includes former smoker (quit 08/22/84), HLD, prediabetes, hypothyroidism, murmur (trivial MR 04/2022), psoriatic arthritis, asthma, allergic rhinoconjunctivitis, vocal fold atrophy, laryngeal pharyngeal reflux, GERD, diverticulitis, skin cancer (s/p excision), migraines, anxiety, memory difficulties.  She had stress test and echocardiogram last year (see CV below) following evaluation by Dr. Tomie China for atypical chest pain and murmur. Preoperative cardiology input per Edd Fabian, NP on 11/29/22,  Tawnya Crook Mathwig would be at acceptable risk for the planned procedure without further cardiovascular testing.    Her RCRI is a class II risk, 0.9% risk of major cardiac event.  She is able to complete greater than 4 METS of physical activity."  She is a same-day workup, so labs and anesthesia team evaluation on the day of surgery.   VS: LMP 08/22/2005  BP Readings from Last 3 Encounters:  10/20/22 128/86  05/04/22 128/80  04/29/22 (!) 148/93   Pulse Readings from Last 3 Encounters:  10/20/22 80  05/04/22 80  04/29/22 69     PROVIDERS: Deatra James, MD is PCP  Loma Sousa, MD is cardiologist Laurette Schimke, MD is allergist Marlowe Kays, PA-C is neurology provider   LABS: Most recent labs in [facility] Everywhere show: Creatinine 0.72, alkaline phosphatase 105, AST 22, ALT 17, total protein 6.8, albumin 4.4, WBC 5.5, hemoglobin 14.0, hematocrit 41.4, platelet count 295.  A1c 5.5% on 07/12/2021.     IMAGES: MRI L-spine 08/04/22: IMPRESSION: 1. Lumbar spine spondylosis as described above. 2. No acute osseous injury of the lumbar spine.  MRI Brain  04/16/22: IMPRESSION: No evidence of recent infarction, hemorrhage, or mass. No progressive parenchymal volume loss or white matter changes.    EKG: 05/04/2022: Normal sinus rhythm   CV: Echo 05/17/22: IMPRESSIONS   1. Left ventricular ejection fraction, by estimation, is 55 to 60%. The  left ventricle has normal function. The left ventricle has no regional  wall motion abnormalities. There is mild concentric left ventricular  hypertrophy. Left ventricular diastolic  parameters are consistent with Grade I diastolic dysfunction (impaired  relaxation). The average left ventricular global longitudinal strain is  -15.7 %.   2. Right ventricular systolic function is normal. The right ventricular  size is normal.   3. The mitral valve is normal in structure. Trivial mitral valve  regurgitation. No evidence of mitral stenosis.   4. The aortic valve is normal in structure. Aortic valve regurgitation is  not visualized.   5. Aortic Normal DTA.   6. The inferior vena cava is normal in size with greater than 50%  respiratory variability, suggesting right atrial pressure of 3 mmHg.    Nuclear stress test 05/12/22:   The study is normal. The study is low risk.   Left ventricular function is normal. Nuclear stress EF: 76 %. The left ventricular ejection fraction is hyperdynamic (>65%). End diastolic cavity size is normal.   Good exercise capacity.   Past Medical History:  Diagnosis Date   Achilles tendonosis 10/06/2020   Acute cystitis with hematuria 09/29/2019   Acute infection of nasal sinus 06/21/2021   Allergic rhinoconjunctivitis    Anxiety    Asthma    Cervical os stenosis 04/09/2015  Chest pain    Chronic back pain 09/23/2021   Chronic knee pain after total replacement of right knee joint 07/21/2020   Chronic right shoulder pain 05/25/2021   Depression    Depression, major, recurrent, mild (HCC)    Diverticulitis    Dysuria 07/12/2021   GERD (gastroesophageal reflux  disease)    Glaucoma    Heart murmur    Hypercholesterolemia    Hypothyroidism    Insomnia    Laryngopharyngeal reflux (LPR)    Memory changes    Migraine without aura and without status migrainosus, not intractable 05/07/2021   Migraines    Mild recurrent major depression (HCC) 07/12/2021   Mixed hyperlipidemia 12/22/2019   Muscle wasting and atrophy, not elsewhere classified, left hand 12/01/2020   Osteoarthritis    Osteopenia    Postmenopausal atrophic vaginitis 04/09/2015   Prediabetes 12/22/2019   Psoriatic arthritis (HCC)    RLS (restless legs syndrome)    Seasonal allergic rhinitis due to pollen 06/21/2021   Secondary hypothyroidism 12/22/2019   Skin cancer    Spondylolisthesis at L4-L5 level 04/27/2022   Status post total right knee replacement 12/29/2021   STD (sexually transmitted disease)    HSV II   Tendinopathy of right rotator cuff 05/25/2021   Vocal fold atrophy 09/12/2018    Past Surgical History:  Procedure Laterality Date   BELPHAROPTOSIS REPAIR     BREAST SURGERY Left    times 2   CARDIAC CATHETERIZATION  10/05/2015   COLONOSCOPY  06/03/2013   Moderate predominantly sigmoid diverticulosis. Small interal hemorroids   FOOT SURGERY Right    Removed bone spur   GANGLION CYST EXCISION Left    foot   HEMORRHOID SURGERY     SKIN CANCER EXCISION     TOTAL KNEE ARTHROPLASTY Right 07/21/2020   UPPER GI ENDOSCOPY  03/23/2016   Mild gastritis, gastric polyps bxbenign squamous mucosa with no abnormaility, fundic glad poly in setting of mild chron gastritis.    MEDICATIONS: No current facility-administered medications for this encounter.    acetaminophen (TYLENOL) 500 MG tablet   Adalimumab (HUMIRA PEN) 40 MG/0.4ML PNKT   albuterol (VENTOLIN HFA) 108 (90 Base) MCG/ACT inhaler   ARNUITY ELLIPTA 200 MCG/ACT AEPB   ascorbic acid (VITAMIN C) 500 MG tablet   Biotin w/ Vitamins C & E (HAIR/SKIN/NAILS PO)   budesonide (RHINOCORT AQUA) 32 MCG/ACT nasal spray    Calcium-Vitamin D-Vitamin K [phone] MG-UNT-MCG TABS   cetirizine (ZYRTEC) 10 MG tablet   Cholecalciferol (VITAMIN D) 125 MCG (5000 UT) CAPS   clindamycin (CLEOCIN) 300 MG capsule   diclofenac Sodium (VOLTAREN) 1 % GEL   DULoxetine (CYMBALTA) 60 MG capsule   famotidine (PEPCID) 40 MG tablet   gabapentin (NEURONTIN) 300 MG capsule   ibuprofen (ADVIL) 200 MG tablet   levothyroxine (SYNTHROID) 50 MCG tablet   Magnesium 500 MG TABS   Multiple Vitamin (MULTIVITAMIN WITH MINERALS) TABS tablet   nitrofurantoin (MACRODANTIN) 100 MG capsule   nitroGLYCERIN (NITROSTAT) 0.4 MG SL tablet   pantoprazole (PROTONIX) 40 MG tablet   Propylene Glycol, PF, (SYSTANE COMPLETE PF) 0.6 % SOLN   valACYclovir (VALTREX) 500 MG tablet   Clindamycin and Macrodantin are listed as needed.   Shonna Chock, PA-C Surgical Short Stay/Anesthesiology [facility] Phone [phone] [facility] Phone [phone] 01/27/2023 2:56 PM

## 2023-01-30 ENCOUNTER — Ambulatory Visit (HOSPITAL_BASED_OUTPATIENT_CLINIC_OR_DEPARTMENT_OTHER): Payer: Medicare Other | Admitting: Vascular Surgery

## 2023-01-30 ENCOUNTER — Other Ambulatory Visit: Payer: Self-pay

## 2023-01-30 ENCOUNTER — Ambulatory Visit (HOSPITAL_COMMUNITY)
Admission: RE | Disposition: A | Discharge status: Home / Self Care | Payer: Self-pay | Source: Home / Self Care | Attending: Neurological Surgery

## 2023-01-30 ENCOUNTER — Ambulatory Visit (HOSPITAL_COMMUNITY): Payer: Medicare Other

## 2023-01-30 ENCOUNTER — Observation Stay (HOSPITAL_COMMUNITY)
Admission: RE | Admit: 2023-01-30 | Discharge: 2023-01-31 | Disposition: A | Discharge status: Home / Self Care | Payer: Medicare Other | Attending: Neurological Surgery | Admitting: Neurological Surgery

## 2023-01-30 ENCOUNTER — Encounter (HOSPITAL_COMMUNITY): Payer: Self-pay | Admitting: Neurological Surgery

## 2023-01-30 ENCOUNTER — Ambulatory Visit (HOSPITAL_COMMUNITY): Payer: Medicare Other | Admitting: Vascular Surgery

## 2023-01-30 DIAGNOSIS — Z79899 Other long term (current) drug therapy: Secondary | ICD-10-CM | POA: Diagnosis not present

## 2023-01-30 DIAGNOSIS — E039 Hypothyroidism, unspecified: Secondary | ICD-10-CM | POA: Insufficient documentation

## 2023-01-30 DIAGNOSIS — Z87891 Personal history of nicotine dependence: Secondary | ICD-10-CM | POA: Insufficient documentation

## 2023-01-30 DIAGNOSIS — M5416 Radiculopathy, lumbar region: Secondary | ICD-10-CM | POA: Insufficient documentation

## 2023-01-30 DIAGNOSIS — Z96651 Presence of right artificial knee joint: Secondary | ICD-10-CM | POA: Insufficient documentation

## 2023-01-30 DIAGNOSIS — Z85828 Personal history of other malignant neoplasm of skin: Secondary | ICD-10-CM | POA: Diagnosis not present

## 2023-01-30 DIAGNOSIS — J449 Chronic obstructive pulmonary disease, unspecified: Secondary | ICD-10-CM

## 2023-01-30 DIAGNOSIS — M4316 Spondylolisthesis, lumbar region: Secondary | ICD-10-CM | POA: Diagnosis not present

## 2023-01-30 DIAGNOSIS — Z981 Arthrodesis status: Secondary | ICD-10-CM | POA: Diagnosis not present

## 2023-01-30 DIAGNOSIS — J45909 Unspecified asthma, uncomplicated: Secondary | ICD-10-CM | POA: Insufficient documentation

## 2023-01-30 HISTORY — DX: Cardiac murmur, unspecified: R01.1

## 2023-01-30 LAB — BASIC METABOLIC PANEL
Anion gap: 11 (ref 5–15)
BUN: 13 mg/dL (ref 8–23)
CO2: 24 mmol/L (ref 22–32)
Calcium: 9.2 mg/dL (ref 8.9–10.3)
Chloride: 105 mmol/L (ref 98–111)
Creatinine, Ser: 0.76 mg/dL (ref 0.44–1.00)
GFR, Estimated: >60 mL/min (ref >60)
Glucose, Bld: 100 mg/dL — ABNORMAL HIGH (ref 70–99)
Potassium: 4.1 mmol/L (ref 3.5–5.1)
Sodium: 140 mmol/L (ref 135–145)

## 2023-01-30 LAB — SURGICAL PCR SCREEN
MRSA, PCR: NEGATIVE
Staphylococcus aureus: NEGATIVE

## 2023-01-30 LAB — CBC
HCT: 40.5 % (ref 36.0–46.0)
Hemoglobin: 13.2 g/dL (ref 12.0–15.0)
MCH: 29.6 pg (ref 26.0–34.0)
MCHC: 32.6 g/dL (ref 30.0–36.0)
MCV: 90.8 fL (ref 80.0–100.0)
Platelets: 263 10*3/uL (ref 150–400)
RBC: 4.46 MIL/uL (ref 3.87–5.11)
RDW: 13.8 % (ref 11.5–15.5)
WBC: 5.6 10*3/uL (ref 4.0–10.5)
nRBC: 0 % (ref 0.0–0.2)

## 2023-01-30 LAB — TYPE AND SCREEN
ABO/RH(D): O POS
Antibody Screen: NEGATIVE

## 2023-01-30 LAB — ABO/RH: ABO/RH(D): O POS

## 2023-01-30 SURGERY — POSTERIOR LUMBAR FUSION 1 LEVEL
Anesthesia: General | Site: Back

## 2023-01-30 MED ORDER — ONDANSETRON HCL 4 MG/2ML IJ SOLN: 4.0000 mg | Freq: Four times a day (QID) | INTRAMUSCULAR | Status: DC | PRN

## 2023-01-30 MED ORDER — MIDAZOLAM HCL 2 MG/2ML IJ SOLN
INTRAMUSCULAR | Status: DC | PRN
  Administered 2023-01-30 (×2): 1 mg via INTRAVENOUS

## 2023-01-30 MED ORDER — MORPHINE SULFATE (PF) 2 MG/ML IV SOLN: 2.0000 mg | INTRAVENOUS | Status: DC | PRN

## 2023-01-30 MED ORDER — POLYETHYLENE GLYCOL 3350 17 G PO PACK: 17.0000 g | PACK | Freq: Every day | ORAL | Status: DC | PRN

## 2023-01-30 MED ORDER — DEXAMETHASONE SODIUM PHOSPHATE 10 MG/ML IJ SOLN
INTRAMUSCULAR | Status: AC
  Filled 2023-01-30: qty 1

## 2023-01-30 MED ORDER — LIDOCAINE-EPINEPHRINE 1 %-1:100000 IJ SOLN
INTRAMUSCULAR | Status: AC
  Filled 2023-01-30: qty 1

## 2023-01-30 MED ORDER — ONDANSETRON HCL 4 MG/2ML IJ SOLN
INTRAMUSCULAR | Status: AC
  Filled 2023-01-30: qty 2

## 2023-01-30 MED ORDER — MAGNESIUM HYDROXIDE NICU ORAL SYRINGE 400 MG/5 ML: 30.0000 mL | Freq: Every day | ORAL | Status: DC | PRN

## 2023-01-30 MED ORDER — OXYCODONE HCL 5 MG PO TABS
5.0000 mg | ORAL_TABLET | Freq: Once | ORAL | Status: AC | PRN
  Administered 2023-01-30: 5 mg via ORAL

## 2023-01-30 MED ORDER — PANTOPRAZOLE SODIUM 40 MG PO TBEC
40.0000 mg | DELAYED_RELEASE_TABLET | Freq: Every day | ORAL | Status: DC
  Administered 2023-01-30 – 2023-01-31 (×2): 40 mg via ORAL
  Filled 2023-01-30 (×2): qty 1

## 2023-01-30 MED ORDER — ONDANSETRON HCL 4 MG/2ML IJ SOLN
INTRAMUSCULAR | Status: DC | PRN
  Administered 2023-01-30: 4 mg via INTRAVENOUS

## 2023-01-30 MED ORDER — OXYCODONE HCL 5 MG PO TABS
ORAL_TABLET | ORAL | Status: AC
  Filled 2023-01-30: qty 1

## 2023-01-30 MED ORDER — BUDESONIDE 0.5 MG/2ML IN SUSP
0.5000 mg | Freq: Two times a day (BID) | RESPIRATORY_TRACT | Status: DC
  Administered 2023-01-30: 0.5 mg via RESPIRATORY_TRACT
  Filled 2023-01-30 (×3): qty 2

## 2023-01-30 MED ORDER — LIDOCAINE 2% (20 MG/ML) 5 ML SYRINGE
INTRAMUSCULAR | Status: DC | PRN
  Administered 2023-01-30: 50 mg via INTRAVENOUS

## 2023-01-30 MED ORDER — BUPIVACAINE HCL (PF) 0.5 % IJ SOLN
INTRAMUSCULAR | Status: AC
  Filled 2023-01-30: qty 30

## 2023-01-30 MED ORDER — 0.9 % SODIUM CHLORIDE (POUR BTL) OPTIME
TOPICAL | Status: DC | PRN
  Administered 2023-01-30: 1000 mL

## 2023-01-30 MED ORDER — ALBUMIN HUMAN 5 % IV SOLN: INTRAVENOUS | Status: DC | PRN

## 2023-01-30 MED ORDER — NITROGLYCERIN 0.4 MG SL SUBL: 0.4000 mg | SUBLINGUAL_TABLET | SUBLINGUAL | Status: DC | PRN

## 2023-01-30 MED ORDER — MENTHOL 3 MG MT LOZG: 1.0000 | LOZENGE | OROMUCOSAL | Status: DC | PRN

## 2023-01-30 MED ORDER — THROMBIN 5000 UNITS EX SOLR
CUTANEOUS | Status: AC
  Filled 2023-01-30: qty 5000

## 2023-01-30 MED ORDER — CHLORHEXIDINE GLUCONATE 0.12 % MT SOLN: 15.0000 mL | Freq: Once | OROMUCOSAL | Status: AC

## 2023-01-30 MED ORDER — SUGAMMADEX SODIUM 200 MG/2ML IV SOLN
INTRAVENOUS | Status: DC | PRN
  Administered 2023-01-30: 200 mg via INTRAVENOUS

## 2023-01-30 MED ORDER — ACETAMINOPHEN 650 MG RE SUPP: 650.0000 mg | RECTAL | Status: DC | PRN

## 2023-01-30 MED ORDER — GABAPENTIN 300 MG PO CAPS
300.0000 mg | ORAL_CAPSULE | Freq: Three times a day (TID) | ORAL | Status: DC
  Administered 2023-01-30 – 2023-01-31 (×3): 300 mg via ORAL
  Filled 2023-01-30 (×3): qty 1

## 2023-01-30 MED ORDER — THROMBIN 5000 UNITS EX SOLR
OROMUCOSAL | Status: DC | PRN
  Administered 2023-01-30 (×2): 5 mL via TOPICAL

## 2023-01-30 MED ORDER — ONDANSETRON HCL 4 MG PO TABS: 4.0000 mg | ORAL_TABLET | Freq: Four times a day (QID) | ORAL | Status: DC | PRN

## 2023-01-30 MED ORDER — FAMOTIDINE 20 MG PO TABS
40.0000 mg | ORAL_TABLET | Freq: Every day | ORAL | Status: DC
  Administered 2023-01-30: 40 mg via ORAL
  Filled 2023-01-30: qty 2

## 2023-01-30 MED ORDER — LORATADINE 10 MG PO TABS
10.0000 mg | ORAL_TABLET | Freq: Every day | ORAL | Status: DC
  Administered 2023-01-31: 10 mg via ORAL
  Filled 2023-01-30 (×2): qty 1

## 2023-01-30 MED ORDER — POLYVINYL ALCOHOL 1.4 % OP SOLN: 1.0000 [drp] | OPHTHALMIC | Status: DC | PRN

## 2023-01-30 MED ORDER — BUPIVACAINE HCL (PF) 0.5 % IJ SOLN
INTRAMUSCULAR | Status: DC | PRN
  Administered 2023-01-30: 5 mL
  Administered 2023-01-30: 25 mL

## 2023-01-30 MED ORDER — SODIUM CHLORIDE 0.9% FLUSH: 3.0000 mL | Freq: Two times a day (BID) | INTRAVENOUS | Status: DC

## 2023-01-30 MED ORDER — CEFAZOLIN SODIUM-DEXTROSE 2-4 GM/100ML-% IV SOLN
2.0000 g | Freq: Three times a day (TID) | INTRAVENOUS | Status: AC
  Administered 2023-01-30 (×2): 2 g via INTRAVENOUS
  Filled 2023-01-30 (×2): qty 100

## 2023-01-30 MED ORDER — SODIUM CHLORIDE 0.9% FLUSH: 3.0000 mL | INTRAVENOUS | Status: DC | PRN

## 2023-01-30 MED ORDER — MIDAZOLAM HCL 2 MG/2ML IJ SOLN: 0.5000 mg | Freq: Once | INTRAMUSCULAR | Status: DC | PRN

## 2023-01-30 MED ORDER — ORAL CARE MOUTH RINSE: 15.0000 mL | Freq: Once | OROMUCOSAL | Status: AC

## 2023-01-30 MED ORDER — PROPYLENE GLYCOL (PF) 0.6 % OP SOLN: 1.0000 [drp] | Freq: Every day | OPHTHALMIC | Status: DC | PRN

## 2023-01-30 MED ORDER — FENTANYL CITRATE (PF) 250 MCG/5ML IJ SOLN
INTRAMUSCULAR | Status: DC | PRN
  Administered 2023-01-30: 50 ug via INTRAVENOUS
  Administered 2023-01-30: 150 ug via INTRAVENOUS
  Administered 2023-01-30: 50 ug via INTRAVENOUS

## 2023-01-30 MED ORDER — BISACODYL 10 MG RE SUPP: 10.0000 mg | Freq: Every day | RECTAL | Status: DC | PRN

## 2023-01-30 MED ORDER — MAGNESIUM 500 MG PO TABS: 500.0000 mg | ORAL_TABLET | Freq: Every day | ORAL | Status: DC

## 2023-01-30 MED ORDER — PHENOL 1.4 % MT LIQD: 1.0000 | OROMUCOSAL | Status: DC | PRN

## 2023-01-30 MED ORDER — METHOCARBAMOL 500 MG PO TABS: 500.0000 mg | ORAL_TABLET | Freq: Four times a day (QID) | ORAL | Status: DC | PRN

## 2023-01-30 MED ORDER — LACTATED RINGERS IV SOLN: INTRAVENOUS | Status: DC

## 2023-01-30 MED ORDER — PHENYLEPHRINE HCL-NACL 20-0.9 MG/250ML-% IV SOLN
INTRAVENOUS | Status: DC | PRN
  Administered 2023-01-30: 25 ug/min via INTRAVENOUS

## 2023-01-30 MED ORDER — PROPOFOL 10 MG/ML IV BOLUS
INTRAVENOUS | Status: AC
  Filled 2023-01-30: qty 20

## 2023-01-30 MED ORDER — LEVOTHYROXINE SODIUM 25 MCG PO TABS
50.0000 ug | ORAL_TABLET | Freq: Every day | ORAL | Status: DC
  Administered 2023-01-31: 50 ug via ORAL
  Filled 2023-01-30: qty 2

## 2023-01-30 MED ORDER — METHOCARBAMOL 1000 MG/10ML IJ SOLN: 500.0000 mg | Freq: Four times a day (QID) | INTRAVENOUS | Status: DC | PRN

## 2023-01-30 MED ORDER — SODIUM CHLORIDE 0.9 % IV SOLN: 250.0000 mL | INTRAVENOUS | Status: DC

## 2023-01-30 MED ORDER — MEPERIDINE HCL 25 MG/ML IJ SOLN: 6.2500 mg | INTRAMUSCULAR | Status: DC | PRN

## 2023-01-30 MED ORDER — ACETAMINOPHEN 325 MG PO TABS: 650.0000 mg | ORAL_TABLET | ORAL | Status: DC | PRN

## 2023-01-30 MED ORDER — PROPOFOL 10 MG/ML IV BOLUS
INTRAVENOUS | Status: DC | PRN
  Administered 2023-01-30: 120 mg via INTRAVENOUS

## 2023-01-30 MED ORDER — FENTANYL CITRATE (PF) 250 MCG/5ML IJ SOLN
INTRAMUSCULAR | Status: AC
  Filled 2023-01-30: qty 5

## 2023-01-30 MED ORDER — ALUM & MAG HYDROXIDE-SIMETH 200-200-20 MG/5ML PO SUSP: 30.0000 mL | Freq: Four times a day (QID) | ORAL | Status: DC | PRN

## 2023-01-30 MED ORDER — CHLORHEXIDINE GLUCONATE CLOTH 2 % EX PADS: 6.0000 | MEDICATED_PAD | Freq: Once | CUTANEOUS | Status: DC

## 2023-01-30 MED ORDER — HYDROMORPHONE HCL 1 MG/ML IJ SOLN: 0.2500 mg | INTRAMUSCULAR | Status: DC | PRN

## 2023-01-30 MED ORDER — ARTIFICIAL TEARS OPHTHALMIC OINT
TOPICAL_OINTMENT | OPHTHALMIC | Status: AC
  Filled 2023-01-30: qty 3.5

## 2023-01-30 MED ORDER — FLUTICASONE PROPIONATE 50 MCG/ACT NA SUSP
1.0000 | Freq: Every day | NASAL | Status: DC
  Filled 2023-01-30: qty 16

## 2023-01-30 MED ORDER — LIDOCAINE-EPINEPHRINE 1 %-1:100000 IJ SOLN
INTRAMUSCULAR | Status: DC | PRN
  Administered 2023-01-30: 5 mL

## 2023-01-30 MED ORDER — DOCUSATE SODIUM 100 MG PO CAPS
100.0000 mg | ORAL_CAPSULE | Freq: Two times a day (BID) | ORAL | Status: DC
  Administered 2023-01-30 – 2023-01-31 (×3): 100 mg via ORAL
  Filled 2023-01-30 (×3): qty 1

## 2023-01-30 MED ORDER — LIDOCAINE 2% (20 MG/ML) 5 ML SYRINGE
INTRAMUSCULAR | Status: AC
  Filled 2023-01-30: qty 5

## 2023-01-30 MED ORDER — OXYCODONE HCL 5 MG/5ML PO SOLN: 5.0000 mg | Freq: Once | ORAL | Status: AC | PRN

## 2023-01-30 MED ORDER — ROCURONIUM BROMIDE 10 MG/ML (PF) SYRINGE
PREFILLED_SYRINGE | INTRAVENOUS | Status: AC
  Filled 2023-01-30: qty 10

## 2023-01-30 MED ORDER — ACETAMINOPHEN 500 MG PO TABS
1000.0000 mg | ORAL_TABLET | Freq: Once | ORAL | Status: AC
  Administered 2023-01-30: 1000 mg via ORAL
  Filled 2023-01-30: qty 2

## 2023-01-30 MED ORDER — SENNA 8.6 MG PO TABS
1.0000 | ORAL_TABLET | Freq: Two times a day (BID) | ORAL | Status: DC
  Administered 2023-01-30 – 2023-01-31 (×3): 8.6 mg via ORAL
  Filled 2023-01-30 (×3): qty 1

## 2023-01-30 MED ORDER — ROCURONIUM BROMIDE 10 MG/ML (PF) SYRINGE
PREFILLED_SYRINGE | INTRAVENOUS | Status: DC | PRN
  Administered 2023-01-30: 60 mg via INTRAVENOUS
  Administered 2023-01-30: 40 mg via INTRAVENOUS
  Administered 2023-01-30 (×2): 20 mg via INTRAVENOUS

## 2023-01-30 MED ORDER — FLEET ENEMA 7-19 GM/118ML RE ENEM: 1.0000 | ENEMA | Freq: Once | RECTAL | Status: DC | PRN

## 2023-01-30 MED ORDER — SUCCINYLCHOLINE CHLORIDE 200 MG/10ML IV SOSY
PREFILLED_SYRINGE | INTRAVENOUS | Status: AC
  Filled 2023-01-30: qty 10

## 2023-01-30 MED ORDER — MIDAZOLAM HCL 2 MG/2ML IJ SOLN
INTRAMUSCULAR | Status: AC
  Filled 2023-01-30: qty 2

## 2023-01-30 MED ORDER — PROMETHAZINE HCL 25 MG/ML IJ SOLN: 6.2500 mg | INTRAMUSCULAR | Status: DC | PRN

## 2023-01-30 MED ORDER — MAGNESIUM OXIDE -MG SUPPLEMENT 400 (240 MG) MG PO TABS
400.0000 mg | ORAL_TABLET | Freq: Every day | ORAL | Status: DC
  Administered 2023-01-30 – 2023-01-31 (×2): 400 mg via ORAL
  Filled 2023-01-30 (×2): qty 1

## 2023-01-30 MED ORDER — ALBUTEROL SULFATE (2.5 MG/3ML) 0.083% IN NEBU: 3.0000 mL | INHALATION_SOLUTION | Freq: Four times a day (QID) | RESPIRATORY_TRACT | Status: DC | PRN

## 2023-01-30 MED ORDER — DULOXETINE HCL 30 MG PO CPEP
60.0000 mg | ORAL_CAPSULE | Freq: Two times a day (BID) | ORAL | Status: DC
  Administered 2023-01-30 – 2023-01-31 (×2): 60 mg via ORAL
  Filled 2023-01-30 (×2): qty 2

## 2023-01-30 MED ORDER — ACETAMINOPHEN 500 MG PO TABS: 1000.0000 mg | ORAL_TABLET | Freq: Four times a day (QID) | ORAL | Status: DC | PRN

## 2023-01-30 MED ORDER — DEXAMETHASONE SODIUM PHOSPHATE 10 MG/ML IJ SOLN
INTRAMUSCULAR | Status: DC | PRN
  Administered 2023-01-30: 10 mg via INTRAVENOUS

## 2023-01-30 MED ORDER — OXYCODONE-ACETAMINOPHEN 5-325 MG PO TABS
1.0000 | ORAL_TABLET | Freq: Four times a day (QID) | ORAL | Status: DC | PRN
  Administered 2023-01-30 – 2023-01-31 (×4): 2 via ORAL
  Filled 2023-01-30 (×5): qty 2

## 2023-01-30 MED ORDER — CEFAZOLIN SODIUM-DEXTROSE 2-4 GM/100ML-% IV SOLN
2.0000 g | INTRAVENOUS | Status: AC
  Administered 2023-01-30: 2 g via INTRAVENOUS
  Filled 2023-01-30: qty 100

## 2023-01-30 MED ORDER — CHLORHEXIDINE GLUCONATE 0.12 % MT SOLN
OROMUCOSAL | Status: AC
  Administered 2023-01-30: 15 mL via OROMUCOSAL
  Filled 2023-01-30: qty 15

## 2023-01-30 SURGICAL SUPPLY — 70 items
ADH SKN CLS APL DERMABOND .7 (GAUZE/BANDAGES/DRESSINGS) ×1
APL SRG 60D 8 XTD TIP BNDBL (TIP)
BAG COUNTER SPONGE SURGICOUNT (BAG) ×1 IMPLANT
BAG SPNG CNTER NS LX DISP (BAG) ×2
BASKET BONE COLLECTION (BASKET) ×1 IMPLANT
BLADE BONE MILL MEDIUM (MISCELLANEOUS) ×1 IMPLANT
BLADE CLIPPER SURG (BLADE) IMPLANT
BONE CANC CHIPS 20CC PCAN1/4 (Bone Implant) ×1 IMPLANT
BUR MATCHSTICK NEURO 3.0 LAGG (BURR) ×1 IMPLANT
CAGE COROENT LG 10X9X23-12 (Cage) IMPLANT
CANISTER SUCT 3000ML PPV (MISCELLANEOUS) ×1 IMPLANT
CHIPS CANC BONE 20CC PCAN1/4 (Bone Implant) ×1 IMPLANT
CNTNR URN SCR LID CUP LEK RST (MISCELLANEOUS) ×1 IMPLANT
CONT SPEC 4OZ STRL OR WHT (MISCELLANEOUS) ×1
COVER BACK TABLE 60X90IN (DRAPES) ×1 IMPLANT
DERMABOND ADVANCED .7 DNX12 (GAUZE/BANDAGES/DRESSINGS) ×1 IMPLANT
DEVICE DISSECT PLASMABLAD 3.0S (MISCELLANEOUS) ×1 IMPLANT
DRAPE C-ARM 42X72 X-RAY (DRAPES) ×2 IMPLANT
DRAPE HALF SHEET 40X57 (DRAPES) IMPLANT
DRAPE LAPAROTOMY 100X72X124 (DRAPES) ×1 IMPLANT
DRSG OPSITE POSTOP 4X6 (GAUZE/BANDAGES/DRESSINGS) IMPLANT
DURAPREP 26ML APPLICATOR (WOUND CARE) ×1 IMPLANT
DURASEAL APPLICATOR TIP (TIP) IMPLANT
DURASEAL SPINE SEALANT 3ML (MISCELLANEOUS) IMPLANT
ELECT REM PT RETURN 9FT ADLT (ELECTROSURGICAL) ×1
ELECTRODE REM PT RTRN 9FT ADLT (ELECTROSURGICAL) ×1 IMPLANT
GAUZE 4X4 16PLY ~~LOC~~+RFID DBL (SPONGE) IMPLANT
GAUZE SPONGE 4X4 12PLY STRL (GAUZE/BANDAGES/DRESSINGS) ×1 IMPLANT
GLOVE BIOGEL PI IND STRL 7.5 (GLOVE) IMPLANT
GLOVE BIOGEL PI IND STRL 8.5 (GLOVE) ×2 IMPLANT
GLOVE ECLIPSE 8.5 STRL (GLOVE) ×2 IMPLANT
GLOVE SURG SS PI 7.0 STRL IVOR (GLOVE) IMPLANT
GOWN STRL REUS W/ TWL LRG LVL3 (GOWN DISPOSABLE) IMPLANT
GOWN STRL REUS W/ TWL XL LVL3 (GOWN DISPOSABLE) IMPLANT
GOWN STRL REUS W/TWL 2XL LVL3 (GOWN DISPOSABLE) ×2 IMPLANT
GOWN STRL REUS W/TWL LRG LVL3 (GOWN DISPOSABLE) ×1
GOWN STRL REUS W/TWL XL LVL3 (GOWN DISPOSABLE)
GRAFT BNE CANC CHIPS 1-8 20CC (Bone Implant) IMPLANT
GRAFT BONE PROTEIOS LRG 5CC (Orthopedic Implant) IMPLANT
HEMOSTAT POWDER KIT SURGIFOAM (HEMOSTASIS) ×1 IMPLANT
KIT BASIN OR (CUSTOM PROCEDURE TRAY) ×1 IMPLANT
KIT GRAFTMAG DEL NEURO DISP (NEUROSURGERY SUPPLIES) IMPLANT
KIT TURNOVER KIT B (KITS) ×1 IMPLANT
MILL BONE PREP (MISCELLANEOUS) ×1 IMPLANT
NEEDLE HYPO 22GX1.5 SAFETY (NEEDLE) ×1 IMPLANT
NS IRRIG 1000ML POUR BTL (IV SOLUTION) ×1 IMPLANT
PACK LAMINECTOMY NEURO (CUSTOM PROCEDURE TRAY) ×1 IMPLANT
PAD ARMBOARD 7.5X6 YLW CONV (MISCELLANEOUS) ×3 IMPLANT
PATTIES SURGICAL .5 X1 (DISPOSABLE) ×1 IMPLANT
PLASMABLADE 3.0S (MISCELLANEOUS) ×1
ROD RELINE-O LORD 5.5X35MM (Rod) IMPLANT
SCREW LOCK RELINE 5.5 TULIP (Screw) IMPLANT
SCREW RELINE-O POLY 6.5X45 (Screw) IMPLANT
SOL ELECTROSURG ANTI STICK (MISCELLANEOUS) ×1
SOLUTION ELECTROSURG ANTI STCK (MISCELLANEOUS) ×1 IMPLANT
SPIKE FLUID TRANSFER (MISCELLANEOUS) ×1 IMPLANT
SPONGE SURGIFOAM ABS GEL 100 (HEMOSTASIS) IMPLANT
SPONGE T-LAP 4X18 ~~LOC~~+RFID (SPONGE) IMPLANT
SUT PROLENE 6 0 BV (SUTURE) IMPLANT
SUT VIC AB 1 CT1 18XBRD ANBCTR (SUTURE) ×1 IMPLANT
SUT VIC AB 1 CT1 8-18 (SUTURE) ×1
SUT VIC AB 2-0 CP2 18 (SUTURE) ×1 IMPLANT
SUT VIC AB 3-0 SH 8-18 (SUTURE) ×1 IMPLANT
SUT VIC AB 4-0 RB1 18 (SUTURE) ×1 IMPLANT
SYR 3ML LL SCALE MARK (SYRINGE) ×4 IMPLANT
TOWEL GREEN STERILE (TOWEL DISPOSABLE) ×1 IMPLANT
TOWEL GREEN STERILE FF (TOWEL DISPOSABLE) ×1 IMPLANT
TRAY FOLEY MTR SLVR 14FR STAT (SET/KITS/TRAYS/PACK) IMPLANT
TRAY FOLEY MTR SLVR 16FR STAT (SET/KITS/TRAYS/PACK) ×1 IMPLANT
WATER STERILE IRR 1000ML POUR (IV SOLUTION) ×1 IMPLANT

## 2023-01-30 NOTE — Op Note (Signed)
Date of surgery: 01/30/2023 Preoperative diagnosis: Spondylolisthesis L4-L5 with bilateral lumbar radiculopathy right greater than left. Postoperative diagnosis: Same Procedure: Bilateral laminotomies and decompression of the L4 and L5 nerve roots with more work than required for simple interbody technique.  Posterior lumbar interbody arthrodesis with local autograft allograft and Prody os peek spacers.  Pedicle screw fixation L4-L5 with posterolateral arthrodesis using local autograft allograft and Prody os. Surgeon: Barnett Abu Anesthesia: General endotracheal Indications: Patient is a 72 year old individual who has been treated conservatively for several years having undergone physical therapy epidural injections and a number of medications including nonsteroidal anti-inflammatories in addition to Biologics as the patient has underlying rheumatoid arthritis.  Having failed all of these and advised surgical decompression and stabilization of her spondylolisthesis at the level of L4-L5.  Procedure: The patient was brought to the operating room supine on the stretcher.  After the smooth induction of general endotracheal anesthesia, she was carefully turned prone.  The back was prepped with alcohol DuraPrep and draped in a sterile fashion.  Fluoroscopic guidance was used to localize the L4-L5 vertical incision was created midline and carried down to the lumbodorsal fascia.  Fascia was opened on either side of midline to expose the interlaminar space at L4-L5.  Dissection was carried out over the facet joints to expose the transverse process of L4 and L5.  Then a laminotomy was created removing the inferior margin lamina of L4 out to and including the entirety of the facet at L4-L5.  On the right side this was noted to be severely misshapen.  There is also marked hypertrophy of the L3 facet inferiorly.  This required some shaving to allow better visualization of the interlaminar space at L4-5.  The lateral  gutters were then packed away for later use and grafting.  The procedure was carried out on the left side to The thickened redundant yellow ligament in this region was taken up to expose common dural tube L4 nerve root superiorly and the L5 nerve root inferiorly.  After isolating the disc space on both sides the space was entered with a 15 blade and a combination of curettes and rongeurs was used to evacuate a substantial quantity of severely degenerated desiccated disc material.  With that the space being evacuated interbody spreader was able to allow better visualization in the disc space and to remove the remnants that were attached to the endplates.  Large toothed curette was used to decorticated surface of L4 and L5.  Then the interspace was sized and it was felt that a 10 x 9 x 23 mm spacer with 12 degrees lordosis would fit best into this interval.  To the spacers were filled with autograft allograft and Prody os and placed into the interspace.  A total of 12 cc of bone graft was also packed into the interspace.  Once the interspace was filling was completed attention was turned to pedicle screw placement.  Using fluoroscopic guidance pedicle screws were then placed sequentially at L4 and L5 these were each 6.5 x 45 mm screws.  Short precontoured 35 mm rods were then used to connect the pedicle screws in a neutral construct.  Final radiographs were obtained in AP and lateral projection and a total of 9 cc of bone was packed into either lateral gutter.  Hemostasis was then carefully checked in the interlaminar space paths of the L4 and L5 nerve roots were checked to make sure they are well decompressed and when the ball was verified the lumbodorsal fascia  was closed after injecting 25 cc of half percent Marcaine into the paraspinous fascia and tissues.  #1 Vicryl was used to close the fascia 2-0 Vicryl was used in the subcutaneous tissues 3-0 and 4-0 Vicryl subcuticular skin.  Dermabond was placed on the  skin.  Blood loss was estimated 200 cc.

## 2023-01-30 NOTE — Transfer of Care (Signed)
Immediate Anesthesia Transfer of Care Note  Patient: Olivia Cruz  Procedure(s) Performed: Lumbar four-five Posterior Lumbar Interbody Fusion (Back)  Patient Location: PACU  Anesthesia Type:General  Level of Consciousness: drowsy, patient cooperative, and responds to stimulation  Airway & Oxygen Therapy: Patient Spontanous Breathing and Patient connected to face mask oxygen  Post-op Assessment: Report given to RN, Post -op Vital signs reviewed and stable, and Patient moving all extremities X 4  Post vital signs: Reviewed and stable  Last Vitals:  Vitals Value Taken Time  BP 100/57 01/30/23 1151  Temp 36.7 C 01/30/23 1150  Pulse 87 01/30/23 1156  Resp 13 01/30/23 1156  SpO2 97 % 01/30/23 1156  Vitals shown include unvalidated device data.  Last Pain:  Vitals:   01/30/23 1150  TempSrc:   PainSc: Asleep         Complications: No notable events documented.

## 2023-01-30 NOTE — Progress Notes (Signed)
Catheter removed at this time patient tolerated procedure

## 2023-01-30 NOTE — H&P (Signed)
Olivia Cruz is an 72 y.o. female.   Chief Complaint: Back and bilateral lower extremity pain, spondylolisthesis L4-5 HPI: Patient is a 72 year old individual has had significant back and bilateral lower extremity pain.  She has evidence of significant spondylolisthesis with stenosis for the L4 and L5 nerve roots.  She has been treated extensively with conservative management and having failed this over the years she has been advised regarding surgical decompression stabilization of L4-L5.  She is now admitted for the surgery.  Past Medical History:  Diagnosis Date   Achilles tendonosis 10/06/2020   Acute cystitis with hematuria 09/29/2019   Acute infection of nasal sinus 06/21/2021   Allergic rhinoconjunctivitis    Anxiety    Asthma    Cervical os stenosis 04/09/2015   Chest pain    Chronic back pain 09/23/2021   Chronic knee pain after total replacement of right knee joint 07/21/2020   Chronic right shoulder pain 05/25/2021   Depression    Depression, major, recurrent, mild (HCC)    Diverticulitis    Dysuria 07/12/2021   GERD (gastroesophageal reflux disease)    Glaucoma    Heart murmur    Hypercholesterolemia    Hypothyroidism    Insomnia    Laryngopharyngeal reflux (LPR)    Memory changes    Migraine without aura and without status migrainosus, not intractable 05/07/2021   Migraines    Mild recurrent major depression (HCC) 07/12/2021   Mixed hyperlipidemia 12/22/2019   Muscle wasting and atrophy, not elsewhere classified, left hand 12/01/2020   Osteoarthritis    Osteopenia    Postmenopausal atrophic vaginitis 04/09/2015   Prediabetes 12/22/2019   Psoriatic arthritis (HCC)    RLS (restless legs syndrome)    Seasonal allergic rhinitis due to pollen 06/21/2021   Secondary hypothyroidism 12/22/2019   Skin cancer    Spondylolisthesis at L4-L5 level 04/27/2022   Status post total right knee replacement 12/29/2021   STD (sexually transmitted disease)    HSV II    Tendinopathy of right rotator cuff 05/25/2021   Vocal fold atrophy 09/12/2018    Past Surgical History:  Procedure Laterality Date   BELPHAROPTOSIS REPAIR     BREAST SURGERY Left    times 2   CARDIAC CATHETERIZATION  10/05/2015   COLONOSCOPY  06/03/2013   Moderate predominantly sigmoid diverticulosis. Small interal hemorroids   FOOT SURGERY Right    Removed bone spur   GANGLION CYST EXCISION Left    foot   HEMORRHOID SURGERY     SKIN CANCER EXCISION     TOTAL KNEE ARTHROPLASTY Right 07/21/2020   UPPER GI ENDOSCOPY  03/23/2016   Mild gastritis, gastric polyps bxbenign squamous mucosa with no abnormaility, fundic glad poly in setting of mild chron gastritis.    Family History  Problem Relation Age of Onset   Breast cancer Sister        lung   Cancer Maternal Grandmother    Multiple births Maternal Grandmother    Multiple births Paternal Grandmother    Cancer Paternal Grandmother    Thyroid disease Mother    Heart failure Mother    Heart disease Mother    Hyperlipidemia Mother    Hypertension Mother    Stroke Mother    Depression Mother    Parkinson's disease Father    Glaucoma Father    Diverticulitis Father    Social History:  reports that she quit smoking about 38 years ago. Her smoking use included cigarettes. She has never used smokeless tobacco. She reports  that she does not drink alcohol and does not use drugs.  Allergies:  Allergies  Allergen Reactions   Codeine Anaphylaxis and Nausea Only   Ambien [Zolpidem Tartrate] Other (See Comments)    Memory loss per patient   Clonazepam     Other Reaction(s): Fatique, Mental Status Changes   Cyproheptadine Hcl Other (See Comments)    Confusion   Dilaudid [Hydromorphone]     hallucinations   Hydrocodone Other (See Comments)    anaphylaxis   Lisinopril Cough   Pramipexole Nausea Only   Sulfa Antibiotics Other (See Comments)    hives   Tramadol Nausea And Vomiting    Medications Prior to Admission   Medication Sig Dispense Refill   acetaminophen (TYLENOL) 500 MG tablet Take 1,000 mg by mouth every 6 (six) hours as needed for pain or headache.     Adalimumab (HUMIRA PEN) 40 MG/0.4ML PNKT Inject 40 mg into the skin once a week. Unsure if dose is 40 mg- every other week     albuterol (VENTOLIN HFA) 108 (90 Base) MCG/ACT inhaler Inhale 2 puffs into the lungs every 6 (six) hours as needed for wheezing or shortness of breath. 8 g 2   ARNUITY ELLIPTA 200 MCG/ACT AEPB Inhale one dose once daily to prevent cough or wheeze.  Rinse, gargle, and spit after use. (Patient taking differently: Inhale 1 puff into the lungs daily as needed (cough or wheeze.).   Rinse, gargle, and spit after use.) 30 each 4   ascorbic acid (VITAMIN C) 500 MG tablet Take 500 mg by mouth daily.     Biotin w/ Vitamins C & E (HAIR/SKIN/NAILS PO) Take 1 tablet by mouth daily.     budesonide (RHINOCORT AQUA) 32 MCG/ACT nasal spray Place 1 spray into both nostrils daily. Reported on 02/04/2016 (Patient taking differently: Place 1 spray into both nostrils 2 (two) times a week. Reported on 02/04/2016) 8.43 mL 3   Calcium-Vitamin D-Vitamin K [phone] MG-UNT-MCG TABS Take 1 tablet by mouth daily.     cetirizine (ZYRTEC) 10 MG tablet Take 10 mg by mouth daily as needed for allergies.     Cholecalciferol (VITAMIN D) 125 MCG (5000 UT) CAPS Take 5,000 Units by mouth once a week.     diclofenac Sodium (VOLTAREN) 1 % GEL Apply 1 Application topically as needed for pain (Knee pain).     DULoxetine (CYMBALTA) 60 MG capsule Take 1 capsule (60 mg total) by mouth 2 (two) times daily. 180 capsule 0   famotidine (PEPCID) 40 MG tablet TAKE 1 TABLET BY MOUTH DAILY AT  BEDTIME 90 tablet 0   gabapentin (NEURONTIN) 300 MG capsule Take 300 mg by mouth in the morning, at noon, and at bedtime.     ibuprofen (ADVIL) 200 MG tablet Take 600 mg by mouth at bedtime.     levothyroxine (SYNTHROID) 50 MCG tablet TAKE 1 TABLET BY MOUTH DAILY 90 tablet 3   Magnesium  500 MG TABS Take 500 mg by mouth daily.     Multiple Vitamin (MULTIVITAMIN WITH MINERALS) TABS tablet Take 1 tablet by mouth daily. Woman 50+     pantoprazole (PROTONIX) 40 MG tablet TAKE 1 TABLET BY MOUTH DAILY 90 tablet 3   Propylene Glycol, PF, (SYSTANE COMPLETE PF) 0.6 % SOLN Place 1 drop into both eyes daily as needed (dry eyes).     clindamycin (CLEOCIN) 300 MG capsule Take 300 mg by mouth as needed. AS NEEDED FOR DENTAL PROCEDURE     nitrofurantoin (MACRODANTIN) 100 MG  capsule Take 100 mg by mouth as needed (after  intercourse).     nitroGLYCERIN (NITROSTAT) 0.4 MG SL tablet Place 1 tablet (0.4 mg total) under the tongue every 5 (five) minutes as needed for chest pain. 25 tablet 6   valACYclovir (VALTREX) 500 MG tablet Take 1 tablet (500 mg total) by mouth 3 (three) times daily as needed (herpes outbreak). 21 tablet 0    Results for orders placed or performed during the hospital encounter of 01/30/23 (from the past 48 hour(s))  Type and screen     Status: None   Collection Time: 01/30/23  6:12 AM  Result Value Ref Range   ABO/RH(D) O POS    Antibody Screen NEG    Sample Expiration      02/02/2023,2359 Performed at [facility], [address], [place], [place] [address]   ABO/Rh     Status: None   Collection Time: 01/30/23  6:18 AM  Result Value Ref Range   ABO/RH(D)      O POS Performed at [facility], [address] [place], [place] [address]   Basic metabolic panel per protocol     Status: Abnormal   Collection Time: 01/30/23  6:25 AM  Result Value Ref Range   Sodium 140 135 - 145 mmol/L   Potassium 4.1 3.5 - 5.1 mmol/L   Chloride 105 98 - 111 mmol/L   CO2 24 22 - 32 mmol/L   Glucose, Bld 100 (H) 70 - 99 mg/dL    Comment: Glucose reference range applies only to samples taken after fasting for at least 8 hours.   BUN 13 8 - 23 mg/dL   Creatinine, Ser 0.98 0.44 - 1.00 mg/dL   Calcium 9.2 8.9 - 11.9 mg/dL   GFR, Estimated >14 >78 mL/min    Comment:  (NOTE) Calculated using the CKD-EPI Creatinine Equation (2021)    Anion gap 11 5 - 15    Comment: Performed at [facility], [address], [place], [place] [address]  CBC per protocol     Status: None   Collection Time: 01/30/23  6:25 AM  Result Value Ref Range   WBC 5.6 4.0 - 10.5 K/uL   RBC 4.46 3.87 - 5.11 MIL/uL   Hemoglobin 13.2 12.0 - 15.0 g/dL   HCT 13.0 86.5 - 78.4 %   MCV 90.8 80.0 - 100.0 fL   MCH 29.6 26.0 - 34.0 pg   MCHC 32.6 30.0 - 36.0 g/dL   RDW 69.6 29.5 - 28.4 %   Platelets 263 150 - 400 K/uL   nRBC 0.0 0.0 - 0.2 %    Comment: Performed at [facility], [address], [place], [place] [address]   No results found.  Review of Systems  Constitutional: Negative.   Musculoskeletal:  Positive for back pain, gait problem and myalgias.  Neurological:  Positive for numbness.  All other systems reviewed and are negative.   Blood pressure (!) 172/95, pulse 74, temperature 98.3 F (36.8 C), temperature source Oral, resp. rate 17, height 5\' 1"  (1.549 m), weight 70.3 kg, last menstrual period 08/22/2005, SpO2 97 %. Physical Exam Constitutional:      Appearance: Normal appearance.  HENT:     Head: Normocephalic and atraumatic.     Right Ear: Tympanic membrane, ear canal and external ear normal.     Left Ear: Tympanic membrane, ear canal and external ear normal.     Nose: Nose normal.  Mouth/Throat:     Mouth: Mucous membranes are moist.     Pharynx: Oropharynx is clear.  Eyes:     Extraocular Movements: Extraocular movements intact.     Conjunctiva/sclera: Conjunctivae normal.     Pupils: Pupils are equal, round, and reactive to light.  Cardiovascular:     Rate and Rhythm: Normal rate and regular rhythm.  Pulmonary:     Effort: Pulmonary effort is normal.     Breath sounds: Normal breath sounds.  Abdominal:     General: Abdomen is flat.     Palpations: Abdomen is soft.  Musculoskeletal:     Cervical back: Normal range of motion and neck  supple.     Comments: Positive straight leg raising bilaterally at 30 degrees  Skin:    Capillary Refill: Capillary refill takes less than 2 seconds.  Neurological:     General: No focal deficit present.     Mental Status: She is alert.  Psychiatric:        Mood and Affect: Mood normal.        Behavior: Behavior normal.        Thought Content: Thought content normal.        Judgment: Judgment normal.      Assessment/Plan Spondylolisthesis L4-L5 with stenosis.  Plan posterior decompression arthrodesis with posterior interbody arthrodesis L4-L5.  Stefani Dama, MD 01/30/2023, 7:37 AM

## 2023-01-30 NOTE — Anesthesia Procedure Notes (Signed)
Procedure Name: Intubation Date/Time: 01/30/2023 8:06 AM  Performed by: Shary Decamp, CRNAPre-anesthesia Checklist: Patient identified, Patient being monitored, Timeout performed, Emergency Drugs available and Suction available Patient Re-evaluated:Patient Re-evaluated prior to induction Oxygen Delivery Method: Circle system utilized Preoxygenation: Pre-oxygenation with 100% oxygen Induction Type: IV induction Ventilation: Mask ventilation without difficulty Laryngoscope Size: Mac, 3 and Glidescope Grade View: Grade I Tube type: Oral Tube size: 7.0 mm Number of attempts: 1 Airway Equipment and Method: Video-laryngoscopy and Rigid stylet Placement Confirmation: ETT inserted through vocal cords under direct vision, positive ETCO2 and breath sounds checked- equal and bilateral Secured at: 21 cm Tube secured with: Tape Dental Injury: Teeth and Oropharynx as per pre-operative assessment  Difficulty Due To: Difficult Airway- due to large tongue and Difficult Airway- due to limited oral opening

## 2023-01-30 NOTE — Progress Notes (Signed)
Orthopedic Tech Progress Note Patient Details:  Olivia Cruz [birth year] [id number]  PACU RN called requesting a LSO BRACE   Ortho Devices Type of Ortho Device: Lumbar corsett Ortho Device/Splint Location: BACK Ortho Device/Splint Interventions: Ordered   Post Interventions Patient Tolerated: Well Instructions Provided: Care of device  Donald Pore 01/30/2023, 12:30 PM

## 2023-01-30 NOTE — Anesthesia Postprocedure Evaluation (Signed)
Anesthesia Post Note  Patient: BREELEY BISCHOF  Procedure(s) Performed: Lumbar four-five Posterior Lumbar Interbody Fusion (Back)     Patient location during evaluation: PACU Anesthesia Type: General Level of consciousness: awake and alert, patient cooperative and oriented Pain management: pain level controlled Vital Signs Assessment: post-procedure vital signs reviewed and stable Respiratory status: spontaneous breathing, nonlabored ventilation, respiratory function stable and patient connected to nasal cannula oxygen Cardiovascular status: blood pressure returned to baseline and stable Postop Assessment: no apparent nausea or vomiting Anesthetic complications: no   No notable events documented.  Last Vitals:  Vitals:   01/30/23 1245 01/30/23 1304  BP:  127/78  Pulse: 91 87  Resp: 15 20  Temp:  36.7 C  SpO2: 93% 95%    Last Pain:  Vitals:   01/30/23 1305  TempSrc:   PainSc: 2     LLE Motor Response: Purposeful movement (01/30/23 1305) LLE Sensation: Full sensation (01/30/23 1305) RLE Motor Response: Purposeful movement (01/30/23 1305) RLE Sensation: Full sensation (01/30/23 1305)      Erling Cruz. Porter Moes

## 2023-01-31 DIAGNOSIS — J45909 Unspecified asthma, uncomplicated: Secondary | ICD-10-CM | POA: Diagnosis not present

## 2023-01-31 DIAGNOSIS — E039 Hypothyroidism, unspecified: Secondary | ICD-10-CM | POA: Diagnosis not present

## 2023-01-31 DIAGNOSIS — Z87891 Personal history of nicotine dependence: Secondary | ICD-10-CM | POA: Diagnosis not present

## 2023-01-31 DIAGNOSIS — M4316 Spondylolisthesis, lumbar region: Secondary | ICD-10-CM | POA: Diagnosis not present

## 2023-01-31 DIAGNOSIS — Z96651 Presence of right artificial knee joint: Secondary | ICD-10-CM | POA: Diagnosis not present

## 2023-01-31 DIAGNOSIS — Z79899 Other long term (current) drug therapy: Secondary | ICD-10-CM | POA: Diagnosis not present

## 2023-01-31 DIAGNOSIS — Z85828 Personal history of other malignant neoplasm of skin: Secondary | ICD-10-CM | POA: Diagnosis not present

## 2023-01-31 DIAGNOSIS — M5416 Radiculopathy, lumbar region: Secondary | ICD-10-CM | POA: Diagnosis not present

## 2023-01-31 LAB — CBC
HCT: 34.2 % — ABNORMAL LOW (ref 36.0–46.0)
Hemoglobin: 11.4 g/dL — ABNORMAL LOW (ref 12.0–15.0)
MCH: 29.8 pg (ref 26.0–34.0)
MCHC: 33.3 g/dL (ref 30.0–36.0)
MCV: 89.3 fL (ref 80.0–100.0)
Platelets: 243 10*3/uL (ref 150–400)
RBC: 3.83 MIL/uL — ABNORMAL LOW (ref 3.87–5.11)
RDW: 14 % (ref 11.5–15.5)
WBC: 13.5 10*3/uL — ABNORMAL HIGH (ref 4.0–10.5)
nRBC: 0 % (ref 0.0–0.2)

## 2023-01-31 LAB — BASIC METABOLIC PANEL
Anion gap: 8 (ref 5–15)
BUN: 12 mg/dL (ref 8–23)
CO2: 24 mmol/L (ref 22–32)
Calcium: 8.6 mg/dL — ABNORMAL LOW (ref 8.9–10.3)
Chloride: 105 mmol/L (ref 98–111)
Creatinine, Ser: 0.87 mg/dL (ref 0.44–1.00)
GFR, Estimated: >60 mL/min (ref >60)
Glucose, Bld: 142 mg/dL — ABNORMAL HIGH (ref 70–99)
Potassium: 4.5 mmol/L (ref 3.5–5.1)
Sodium: 137 mmol/L (ref 135–145)

## 2023-01-31 MED ORDER — DEXAMETHASONE 1 MG PO TABS: ORAL_TABLET | ORAL | 0 refills | Status: DC

## 2023-01-31 MED ORDER — ONDANSETRON HCL 4 MG PO TABS: 4.0000 mg | ORAL_TABLET | Freq: Four times a day (QID) | ORAL | 0 refills | Status: DC | PRN

## 2023-01-31 MED ORDER — METHOCARBAMOL 500 MG PO TABS: 500.0000 mg | ORAL_TABLET | Freq: Four times a day (QID) | ORAL | 3 refills | Status: AC | PRN

## 2023-01-31 MED ORDER — OXYCODONE-ACETAMINOPHEN 5-325 MG PO TABS: 1.0000 | ORAL_TABLET | Freq: Four times a day (QID) | ORAL | 0 refills | Status: DC | PRN

## 2023-01-31 MED FILL — Thrombin For Soln 5000 Unit: CUTANEOUS | Qty: 5000 | Status: AC

## 2023-01-31 NOTE — Discharge Summary (Signed)
Physician Discharge Summary  Patient ID: TASHEKA HOUSEMAN MRN: [id number] DOB/AGE: 02/11/1951 72 y.o.  Admit date: 01/30/2023 Discharge date: 01/31/2023  Admission Diagnoses: Spondylolisthesis L4-L5 with lumbar radiculopathy  Discharge Diagnoses: Spondylolisthesis L4-L5 with lumbar radiculopathy Principal Problem:   Spondylolisthesis at L4-L5 level   Discharged Condition: good  Hospital Course: Patient was admitted to undergo surgical decompression and stabilization at L4-L5 which she tolerated well.  Consults: None  Significant Diagnostic Studies: None  Treatments: surgery: See op note  Discharge Exam: Blood pressure 114/75, pulse 80, temperature 98.4 F (36.9 C), temperature source Oral, resp. rate 18, height 5\' 1"  (1.549 m), weight 70.3 kg, last menstrual period 08/22/2005, SpO2 96 %. Incision is clean and dry Station and gait are intact.  Disposition: Discharge disposition: 01-Home or Self Care       Discharge Instructions     Call MD for:  redness, tenderness, or signs of infection (pain, swelling, redness, odor or green/yellow discharge around incision site)   Complete by: As directed    Call MD for:  severe uncontrolled pain   Complete by: As directed    Call MD for:  temperature >100.4   Complete by: As directed    Diet - low sodium heart healthy   Complete by: As directed    Discharge instructions   Complete by: As directed    Okay to shower. Do not apply salves or appointments to incision. No heavy lifting with the upper extremities greater than 10 pounds. May resume driving when not requiring pain medication and patient feels comfortable with doing so.   Incentive spirometry RT   Complete by: As directed    Increase activity slowly   Complete by: As directed       Allergies as of 01/31/2023       Reactions   Codeine Anaphylaxis, Nausea Only   Ambien [zolpidem Tartrate] Other (See Comments)   Memory loss per patient   Clonazepam    Other  Reaction(s): Fatique, Mental Status Changes   Cyproheptadine Hcl Other (See Comments)   Confusion   Dilaudid [hydromorphone]    hallucinations   Hydrocodone Other (See Comments)   anaphylaxis   Lisinopril Cough   Pramipexole Nausea Only   Sulfa Antibiotics Other (See Comments)   hives   Tramadol Nausea And Vomiting        Medication List     TAKE these medications    acetaminophen 500 MG tablet Commonly known as: TYLENOL Take 1,000 mg by mouth every 6 (six) hours as needed for pain or headache.   albuterol 108 (90 Base) MCG/ACT inhaler Commonly known as: VENTOLIN HFA Inhale 2 puffs into the lungs every 6 (six) hours as needed for wheezing or shortness of breath.   Arnuity Ellipta 200 MCG/ACT Aepb Generic drug: Fluticasone Furoate Inhale one dose once daily to prevent cough or wheeze.  Rinse, gargle, and spit after use. What changed:  how much to take how to take this when to take this reasons to take this additional instructions   ascorbic acid 500 MG tablet Commonly known as: VITAMIN C Take 500 mg by mouth daily.   budesonide 32 MCG/ACT nasal spray Commonly known as: RHINOCORT AQUA Place 1 spray into both nostrils daily. Reported on 02/04/2016 What changed: when to take this   Calcium-Vitamin D-Vitamin K [id number] MG-UNT-MCG Tabs Take 1 tablet by mouth daily.   cetirizine 10 MG tablet Commonly known as: ZYRTEC Take 10 mg by mouth daily as needed for allergies.   clindamycin  300 MG capsule Commonly known as: CLEOCIN Take 300 mg by mouth as needed. AS NEEDED FOR DENTAL PROCEDURE   dexamethasone 1 MG tablet Commonly known as: DECADRON 2 tablets twice daily for 2 days, one tablet twice daily for 2 days, one tablet daily for 2 days.   diclofenac Sodium 1 % Gel Commonly known as: VOLTAREN Apply 1 Application topically as needed for pain (Knee pain).   DULoxetine 60 MG capsule Commonly known as: Cymbalta Take 1 capsule (60 mg total) by mouth 2 (two)  times daily.   famotidine 40 MG tablet Commonly known as: PEPCID TAKE 1 TABLET BY MOUTH DAILY AT  BEDTIME   gabapentin 300 MG capsule Commonly known as: NEURONTIN Take 300 mg by mouth in the morning, at noon, and at bedtime.   HAIR/SKIN/NAILS PO Take 1 tablet by mouth daily.   Humira (2 Pen) 40 MG/0.4ML Pnkt Generic drug: Adalimumab Inject 40 mg into the skin once a week. Unsure if dose is 40 mg- every other week   ibuprofen 200 MG tablet Commonly known as: ADVIL Take 600 mg by mouth at bedtime.   levothyroxine 50 MCG tablet Commonly known as: SYNTHROID TAKE 1 TABLET BY MOUTH DAILY   Magnesium 500 MG Tabs Take 500 mg by mouth daily.   methocarbamol 500 MG tablet Commonly known as: ROBAXIN Take 1 tablet (500 mg total) by mouth every 6 (six) hours as needed for muscle spasms.   multivitamin with minerals Tabs tablet Take 1 tablet by mouth daily. Woman 50+   nitrofurantoin 100 MG capsule Commonly known as: MACRODANTIN Take 100 mg by mouth as needed (after  intercourse).   nitroGLYCERIN 0.4 MG SL tablet Commonly known as: NITROSTAT Place 1 tablet (0.4 mg total) under the tongue every 5 (five) minutes as needed for chest pain.   ondansetron 4 MG tablet Commonly known as: ZOFRAN Take 1 tablet (4 mg total) by mouth every 6 (six) hours as needed for nausea or vomiting.   oxyCODONE-acetaminophen 5-325 MG tablet Commonly known as: PERCOCET/ROXICET Take 1-2 tablets by mouth every 6 (six) hours as needed for severe pain or moderate pain.   pantoprazole 40 MG tablet Commonly known as: PROTONIX TAKE 1 TABLET BY MOUTH DAILY   Systane Complete PF 0.6 % Soln Generic drug: Propylene Glycol (PF) Place 1 drop into both eyes daily as needed (dry eyes).   valACYclovir 500 MG tablet Commonly known as: VALTREX Take 1 tablet (500 mg total) by mouth 3 (three) times daily as needed (herpes outbreak).   Vitamin D 125 MCG (5000 UT) Caps Take 5,000 Units by mouth once a week.          Signed: Shary Key Tayen Narang 01/31/2023, 7:59 AM

## 2023-01-31 NOTE — Plan of Care (Signed)
Pt and husband given D/C instructions with verbal understanding. Rx's were sent to the pharmacy by MD. Pt's incision is clean and dry with no sign of infection. Pt's IV was removed prior to D/C. Pt received 3-n-1 from [facility] per MD order. Pt is stable @ D/C and has no other needs at this time. Rema Fendt, RN

## 2023-01-31 NOTE — Evaluation (Signed)
Occupational Therapy Evaluation Patient Details Name: Olivia Cruz MRN: [id number] DOB: 1951/04/22 Today's Date: 01/31/2023   History of Present Illness 72 yo female s/p 6/10 L4-5 PLIF PMH achilles tendonosis, cervical stenosis, chronic pain depression glaucoma insomnia memory changes L hand muscle wasting OA vocal cord atrophy R TKA   Clinical Impression   Patient is s/p L4-5 PLIF surgery resulting in functional limitations due to the deficits listed below (see OT problem list). Pt at baseline driving and indep with adls. Pt demonstrates ability to complete dressing with reacher for LB. Pt has AE at home at this time. Pt has x2 dogs and will have spouse care for dogs.  Patient will benefit from skilled OT acutely to increase independence and safety with ADLS to allow discharge home.       Recommendations for follow up therapy are one component of a multi-disciplinary discharge planning process, led by the attending physician.  Recommendations may be updated based on patient status, additional functional criteria and insurance authorization.   Assistance Recommended at Discharge None  Patient can return home with the following      Functional Status Assessment  Patient has had a recent decline in their functional status and demonstrates the ability to make significant improvements in function in a reasonable and predictable amount of time.  Equipment Recommendations  BSC/3in1 (requesting a BSC)    Recommendations for Other Services       Precautions / Restrictions Precautions Precautions: Fall;Back Required Braces or Orthoses: Spinal Brace Spinal Brace: Lumbar corset Restrictions Weight Bearing Restrictions: No      Mobility Bed Mobility Overal bed mobility: Independent                  Transfers Overall transfer level: Independent Equipment used: Rolling walker (2 wheels) Transfers: Sit to/from Stand Sit to Stand: Supervision                   Balance Overall balance assessment: Needs assistance Sitting-balance support: Bilateral upper extremity supported, Feet supported Sitting balance-Leahy Scale: Good                                     ADL either performed or assessed with clinical judgement   ADL Overall ADL's : Modified independent                                       General ADL Comments: dressing LB with underwear and reacher. has reacher at home, completed sink level grooming bed mobility toilet transfer, chair transfer and brace application. pt educated on don doff and always something between patient and bare skin     Vision Baseline Vision/History: 1 Wears glasses Ability to See in Adequate Light: 0 Adequate Patient Visual Report: No change from baseline Vision Assessment?: No apparent visual deficits Additional Comments: wears glasses always in community and sometimes at home. requires glasses for distance     Perception     Praxis      Pertinent Vitals/Pain Pain Assessment Pain Assessment: No/denies pain     Hand Dominance Right   Extremity/Trunk Assessment Upper Extremity Assessment Upper Extremity Assessment: Overall WFL for tasks assessed   Lower Extremity Assessment Lower Extremity Assessment: Overall WFL for tasks assessed   Cervical / Trunk Assessment Cervical / Trunk Assessment: Back Surgery   Communication Communication  Communication: No difficulties   Cognition Arousal/Alertness: Awake/alert Behavior During Therapy: WFL for tasks assessed/performed Overall Cognitive Status: Within Functional Limits for tasks assessed                                       General Comments  incision dry and dressing intact    Exercises     Shoulder Instructions      Home Living Family/patient expects to be discharged to:: Private residence Living Arrangements: Spouse/significant other Available Help at Discharge: Family Type of Home:  House Home Access: Stairs to enter [employer] of Steps: 8 Front, 13 basement, 2 Carport Entrance Stairs-Rails: Left Home Layout: One level;Able to live on main level with bedroom/bathroom;Full bath on main level     Bathroom Shower/Tub: Chief Strategy Officer: Standard     Home Equipment: Agricultural consultant (2 wheels);Cane - quad;Cane - single point   Additional Comments: has 2 dogs      Prior Functioning/Environment Prior Level of Function : Independent/Modified Independent                        OT Problem List:        OT Treatment/Interventions:      OT Goals(Current goals can be found in the care plan section) Acute Rehab OT Goals Patient Stated Goal: to go home  OT Frequency:      Co-evaluation              AM-PAC OT "6 Clicks" Daily Activity     Outcome Measure Help from another person eating meals?: None Help from another person taking care of personal grooming?: None Help from another person toileting, which includes using toliet, bedpan, or urinal?: None Help from another person bathing (including washing, rinsing, drying)?: None Help from another person to put on and taking off regular upper body clothing?: None Help from another person to put on and taking off regular lower body clothing?: None 6 Click Score: 24   End of Session Equipment Utilized During Treatment: Rolling walker (2 wheels);Back brace Nurse Communication: Mobility status;Precautions  Activity Tolerance: Patient tolerated treatment well Patient left: in chair;with call bell/phone within reach  OT Visit Diagnosis: Unsteadiness on feet (R26.81)                Time: 9562-1308 OT Time Calculation (min): 28 min Charges:  OT General Charges $OT Visit: 1 Visit OT Evaluation $OT Eval Moderate Complexity: 1 Mod   Brynn, OTR/L  Acute Rehabilitation Services Office: [phone] .   Mateo Flow 01/31/2023, 12:04 PM

## 2023-01-31 NOTE — Evaluation (Signed)
Physical Therapy Evaluation Patient Details Name: Olivia Cruz MRN: [id number] DOB: 1950/09/22 Today's Date: 01/31/2023  History of Present Illness  Pt is a 72 y.o. female presenting s/p with L4-L5 lumbar laminotomies and decompression (01/30/2023). PMH significant for depression, hypothyroidism, HLD, osteopenia, s/p TKA (12/29/2021).   Clinical Impression  Upon evaluation pt supine in bed with HOB elevated alert and oriented. Prior to surgery pt was independent in all ADLs and functional mobility. She lives with her spouse in a single level home and several steps to enter; she reported he will be able to provide 24 hr support when needed. Currently the patient is able to perform bed mobility, transfers and ambulation without physical assistance. She endorses having a mattress that is able to bring head of bed elevated for transfers; educated on using rolling technique rather than helicopter technique for safety. She was supervision to min guard for safety throughout session. Educated on precautions, car transfer, brace schedule and exercise progression; able to recall all precautions with good return demonstration. Recommending discharge to home with family support when needed.      Recommendations for follow up therapy are one component of a multi-disciplinary discharge planning process, led by the attending physician.  Recommendations may be updated based on patient status, additional functional criteria and insurance authorization.  Follow Up Recommendations       Assistance Recommended at Discharge Intermittent Supervision/Assistance  Patient can return home with the following  A little help with walking and/or transfers;A little help with bathing/dressing/bathroom;Assist for transportation;Help with stairs or ramp for entrance    Equipment Recommendations None recommended by PT  Recommendations for Other Services       Functional Status Assessment Patient has had a recent  decline in their functional status and demonstrates the ability to make significant improvements in function in a reasonable and predictable amount of time.     Precautions / Restrictions Precautions Precautions: Fall;Back Precaution Booklet Issued: Yes (comment) Required Braces or Orthoses: Spinal Brace Spinal Brace: Lumbar corset Restrictions Weight Bearing Restrictions: No      Mobility  Bed Mobility Overal bed mobility: Needs Assistance Bed Mobility: Rolling, Sit to Sidelying, Sidelying to Sit Rolling: Supervision Sidelying to sit: Supervision     Sit to sidelying: Supervision General bed mobility comments: Performed all bed mobility without physical assistance, Verbal cues provided for scooting to seated EOB . Educated on scooting to [facility] in sidelying position.    Transfers Overall transfer level: Needs assistance Equipment used: Rolling walker (2 wheels) Transfers: Sit to/from Stand Sit to Stand: Supervision           General transfer comment: Performed sit to stand without physical assistance, no verbal cues provided for hand placement. Cued to scoot to EOB and foot placement.    Ambulation/Gait Ambulation/Gait assistance: Min guard Gait Distance (Feet): 100 Feet Assistive device: Rolling walker (2 wheels) Gait Pattern/deviations: Step-through pattern, Decreased step length - right, Decreased step length - left, Decreased stride length Gait velocity: Decreased Gait velocity interpretation: <1.8 ft/sec, indicate of risk for recurrent falls   General Gait Details: Demonstrated upright posture, good proximity to walker and step through pattern. Gait velocity slowed likely due to soreness and stiffness.  Stairs Stairs: Yes Stairs assistance: Min guard Stair Management: One rail Left, Sideways, Forwards Number of Stairs: 10 General stair comments: Ascended sideway without physical performance. Descended forward; more hesitant and cautious; needed hand held  support with rail. Educated on descending sideways for safety.  Wheelchair Mobility    Modified  Rankin (Stroke Patients Only)       Balance Overall balance assessment: Needs assistance Sitting-balance support: No upper extremity supported Sitting balance-Leahy Scale: Good Sitting balance - Comments: Able to sit upright EOB to don lumbar corset without UE support.   Standing balance support: Bilateral upper extremity supported, During functional activity Standing balance-Leahy Scale: Fair                               Pertinent Vitals/Pain Pain Assessment Pain Assessment: No/denies pain    Home Living Family/patient expects to be discharged to:: Private residence Living Arrangements: Spouse/significant other Available Help at Discharge: Family Type of Home: House Home Access: Stairs to enter Entrance Stairs-Rails: Left Entrance Stairs-Number of Steps: 8 Front, 13 basement, 2 Carport   Home Layout: One level;Able to live on main level with bedroom/bathroom;Full bath on main level Home Equipment: Rolling Walker (2 wheels);Cane - quad;Cane - single point      Prior Function Prior Level of Function : Independent/Modified Independent                     Hand Dominance   Dominant Hand: Right    Extremity/Trunk Assessment   Upper Extremity Assessment Upper Extremity Assessment: Defer to OT evaluation    Lower Extremity Assessment Lower Extremity Assessment: Overall WFL for tasks assessed    Cervical / Trunk Assessment Cervical / Trunk Assessment: Back Surgery  Communication   Communication: No difficulties  Cognition Arousal/Alertness: Awake/alert Behavior During Therapy: WFL for tasks assessed/performed Overall Cognitive Status: Within Functional Limits for tasks assessed                                 General Comments: Alert and Oriented x4        General Comments      Exercises     Assessment/Plan    PT  Assessment Patient needs continued PT services  PT Problem List Decreased balance;Decreased activity tolerance;Decreased range of motion;Decreased mobility;Decreased strength;Decreased coordination;Decreased knowledge of use of DME;Decreased knowledge of precautions       PT Treatment Interventions DME instruction;Gait training;Stair training;Functional mobility training;Therapeutic exercise;Therapeutic activities;Balance training;Neuromuscular re-education    PT Goals (Current goals can be found in the Care Plan section)  Acute Rehab PT Goals Patient Stated Goal: Pt would like to return back to home PT Goal Formulation: With patient Time For Goal Achievement: 02/07/23 Potential to Achieve Goals: Good    Frequency Min 5X/week     Co-evaluation               AM-PAC PT "6 Clicks" Mobility  Outcome Measure Help needed turning from your back to your side while in a flat bed without using bedrails?: None Help needed moving from lying on your back to sitting on the side of a flat bed without using bedrails?: None Help needed moving to and from a bed to a chair (including a wheelchair)?: A Little Help needed standing up from a chair using your arms (e.g., wheelchair or bedside chair)?: A Little Help needed to walk in hospital room?: A Little Help needed climbing 3-5 steps with a railing? : A Little 6 Click Score: 20    End of Session Equipment Utilized During Treatment: Gait belt;Back brace Activity Tolerance: Patient tolerated treatment well Patient left: in bed;with call bell/phone within reach Nurse Communication: Mobility status PT Visit Diagnosis: Muscle  weakness (generalized) (M62.81);Pain Pain - part of body:  (Incision Site)    Time: 0981-1914 PT Time Calculation (min) (ACUTE ONLY): 27 min   Charges:              Christene Lye, SPT Acute Rehabilitation Services [phone] Secure chat preferred    Christene Lye 01/31/2023, 11:58 AM

## 2023-02-01 ENCOUNTER — Other Ambulatory Visit: Payer: Self-pay | Admitting: Allergy and Immunology

## 2023-02-03 DIAGNOSIS — J209 Acute bronchitis, unspecified: Secondary | ICD-10-CM | POA: Diagnosis not present

## 2023-02-03 DIAGNOSIS — D84821 Immunodeficiency due to drugs: Secondary | ICD-10-CM | POA: Diagnosis not present

## 2023-02-03 DIAGNOSIS — L4050 Arthropathic psoriasis, unspecified: Secondary | ICD-10-CM | POA: Diagnosis not present

## 2023-02-07 DIAGNOSIS — L4050 Arthropathic psoriasis, unspecified: Secondary | ICD-10-CM | POA: Diagnosis not present

## 2023-02-07 DIAGNOSIS — Z0000 Encounter for general adult medical examination without abnormal findings: Secondary | ICD-10-CM | POA: Diagnosis not present

## 2023-02-07 DIAGNOSIS — E7800 Pure hypercholesterolemia, unspecified: Secondary | ICD-10-CM | POA: Diagnosis not present

## 2023-02-07 DIAGNOSIS — K219 Gastro-esophageal reflux disease without esophagitis: Secondary | ICD-10-CM | POA: Diagnosis not present

## 2023-02-07 DIAGNOSIS — E039 Hypothyroidism, unspecified: Secondary | ICD-10-CM | POA: Diagnosis not present

## 2023-02-07 DIAGNOSIS — G2581 Restless legs syndrome: Secondary | ICD-10-CM | POA: Diagnosis not present

## 2023-02-07 DIAGNOSIS — J309 Allergic rhinitis, unspecified: Secondary | ICD-10-CM | POA: Diagnosis not present

## 2023-02-07 DIAGNOSIS — M8588 Other specified disorders of bone density and structure, other site: Secondary | ICD-10-CM | POA: Diagnosis not present

## 2023-02-07 DIAGNOSIS — J209 Acute bronchitis, unspecified: Secondary | ICD-10-CM | POA: Diagnosis not present

## 2023-02-07 MED FILL — Sodium Chloride IV Soln 0.9%: INTRAVENOUS | Qty: 1000 | Status: AC

## 2023-02-07 MED FILL — Heparin Sodium (Porcine) Inj 1000 Unit/ML: INTRAMUSCULAR | Qty: 30 | Status: AC

## 2023-02-09 ENCOUNTER — Other Ambulatory Visit: Payer: Self-pay | Admitting: *Deleted

## 2023-02-09 ENCOUNTER — Telehealth: Payer: Self-pay | Admitting: Allergy and Immunology

## 2023-02-09 MED ORDER — ARNUITY ELLIPTA 200 MCG/ACT IN AEPB: INHALATION_SPRAY | RESPIRATORY_TRACT | 2 refills | Status: DC

## 2023-02-09 NOTE — Telephone Encounter (Signed)
Please advise regarding cough.  Refill has been sent.

## 2023-02-09 NOTE — Telephone Encounter (Signed)
Patient states she had back surgery June 10th and has been recovering from that. Before she went into surgery, she came back from a cruise and started with a bad cough. She is still dealing with this cough and now has green phlegm. She's not sure what it could be that she has and wants to know if there is anything she can take to help.  She is also requesting a refill for Arnuity sent to [facility] on [address].

## 2023-02-09 NOTE — Telephone Encounter (Signed)
Left a message for Olivia Cruz to call back.

## 2023-02-16 DIAGNOSIS — M8580 Other specified disorders of bone density and structure, unspecified site: Secondary | ICD-10-CM | POA: Diagnosis not present

## 2023-02-16 DIAGNOSIS — J45909 Unspecified asthma, uncomplicated: Secondary | ICD-10-CM | POA: Diagnosis not present

## 2023-02-16 DIAGNOSIS — M48061 Spinal stenosis, lumbar region without neurogenic claudication: Secondary | ICD-10-CM | POA: Diagnosis not present

## 2023-02-16 DIAGNOSIS — M5416 Radiculopathy, lumbar region: Secondary | ICD-10-CM | POA: Diagnosis not present

## 2023-02-16 DIAGNOSIS — I10 Essential (primary) hypertension: Secondary | ICD-10-CM | POA: Diagnosis not present

## 2023-02-16 DIAGNOSIS — K5790 Diverticulosis of intestine, part unspecified, without perforation or abscess without bleeding: Secondary | ICD-10-CM | POA: Diagnosis not present

## 2023-02-16 DIAGNOSIS — E782 Mixed hyperlipidemia: Secondary | ICD-10-CM | POA: Diagnosis not present

## 2023-02-16 DIAGNOSIS — G8929 Other chronic pain: Secondary | ICD-10-CM | POA: Diagnosis not present

## 2023-02-16 DIAGNOSIS — G43009 Migraine without aura, not intractable, without status migrainosus: Secondary | ICD-10-CM | POA: Diagnosis not present

## 2023-02-16 DIAGNOSIS — R7303 Prediabetes: Secondary | ICD-10-CM | POA: Diagnosis not present

## 2023-02-16 DIAGNOSIS — M4316 Spondylolisthesis, lumbar region: Secondary | ICD-10-CM | POA: Diagnosis not present

## 2023-02-16 DIAGNOSIS — J383 Other diseases of vocal cords: Secondary | ICD-10-CM | POA: Diagnosis not present

## 2023-02-16 DIAGNOSIS — G4700 Insomnia, unspecified: Secondary | ICD-10-CM | POA: Diagnosis not present

## 2023-02-16 DIAGNOSIS — G2581 Restless legs syndrome: Secondary | ICD-10-CM | POA: Diagnosis not present

## 2023-02-16 DIAGNOSIS — G603 Idiopathic progressive neuropathy: Secondary | ICD-10-CM | POA: Diagnosis not present

## 2023-02-16 DIAGNOSIS — Z4789 Encounter for other orthopedic aftercare: Secondary | ICD-10-CM | POA: Diagnosis not present

## 2023-02-16 DIAGNOSIS — K219 Gastro-esophageal reflux disease without esophagitis: Secondary | ICD-10-CM | POA: Diagnosis not present

## 2023-02-16 DIAGNOSIS — G4733 Obstructive sleep apnea (adult) (pediatric): Secondary | ICD-10-CM | POA: Diagnosis not present

## 2023-02-16 DIAGNOSIS — L4050 Arthropathic psoriasis, unspecified: Secondary | ICD-10-CM | POA: Diagnosis not present

## 2023-02-16 DIAGNOSIS — H409 Unspecified glaucoma: Secondary | ICD-10-CM | POA: Diagnosis not present

## 2023-02-16 DIAGNOSIS — E038 Other specified hypothyroidism: Secondary | ICD-10-CM | POA: Diagnosis not present

## 2023-02-16 DIAGNOSIS — M6788 Other specified disorders of synovium and tendon, other site: Secondary | ICD-10-CM | POA: Diagnosis not present

## 2023-02-17 DIAGNOSIS — M4316 Spondylolisthesis, lumbar region: Secondary | ICD-10-CM | POA: Diagnosis not present

## 2023-02-21 DIAGNOSIS — M8580 Other specified disorders of bone density and structure, unspecified site: Secondary | ICD-10-CM | POA: Diagnosis not present

## 2023-02-21 DIAGNOSIS — M48061 Spinal stenosis, lumbar region without neurogenic claudication: Secondary | ICD-10-CM | POA: Diagnosis not present

## 2023-02-21 DIAGNOSIS — J383 Other diseases of vocal cords: Secondary | ICD-10-CM | POA: Diagnosis not present

## 2023-02-21 DIAGNOSIS — K219 Gastro-esophageal reflux disease without esophagitis: Secondary | ICD-10-CM | POA: Diagnosis not present

## 2023-02-21 DIAGNOSIS — K5790 Diverticulosis of intestine, part unspecified, without perforation or abscess without bleeding: Secondary | ICD-10-CM | POA: Diagnosis not present

## 2023-02-21 DIAGNOSIS — M6788 Other specified disorders of synovium and tendon, other site: Secondary | ICD-10-CM | POA: Diagnosis not present

## 2023-02-21 DIAGNOSIS — G43009 Migraine without aura, not intractable, without status migrainosus: Secondary | ICD-10-CM | POA: Diagnosis not present

## 2023-02-21 DIAGNOSIS — I10 Essential (primary) hypertension: Secondary | ICD-10-CM | POA: Diagnosis not present

## 2023-02-21 DIAGNOSIS — E782 Mixed hyperlipidemia: Secondary | ICD-10-CM | POA: Diagnosis not present

## 2023-02-21 DIAGNOSIS — L4050 Arthropathic psoriasis, unspecified: Secondary | ICD-10-CM | POA: Diagnosis not present

## 2023-02-21 DIAGNOSIS — G2581 Restless legs syndrome: Secondary | ICD-10-CM | POA: Diagnosis not present

## 2023-02-21 DIAGNOSIS — E038 Other specified hypothyroidism: Secondary | ICD-10-CM | POA: Diagnosis not present

## 2023-02-21 DIAGNOSIS — R7303 Prediabetes: Secondary | ICD-10-CM | POA: Diagnosis not present

## 2023-02-21 DIAGNOSIS — H409 Unspecified glaucoma: Secondary | ICD-10-CM | POA: Diagnosis not present

## 2023-02-21 DIAGNOSIS — Z4789 Encounter for other orthopedic aftercare: Secondary | ICD-10-CM | POA: Diagnosis not present

## 2023-02-21 DIAGNOSIS — G603 Idiopathic progressive neuropathy: Secondary | ICD-10-CM | POA: Diagnosis not present

## 2023-02-21 DIAGNOSIS — G4700 Insomnia, unspecified: Secondary | ICD-10-CM | POA: Diagnosis not present

## 2023-02-21 DIAGNOSIS — M5416 Radiculopathy, lumbar region: Secondary | ICD-10-CM | POA: Diagnosis not present

## 2023-02-21 DIAGNOSIS — G4733 Obstructive sleep apnea (adult) (pediatric): Secondary | ICD-10-CM | POA: Diagnosis not present

## 2023-02-21 DIAGNOSIS — J45909 Unspecified asthma, uncomplicated: Secondary | ICD-10-CM | POA: Diagnosis not present

## 2023-02-21 DIAGNOSIS — M4316 Spondylolisthesis, lumbar region: Secondary | ICD-10-CM | POA: Diagnosis not present

## 2023-02-21 DIAGNOSIS — G8929 Other chronic pain: Secondary | ICD-10-CM | POA: Diagnosis not present

## 2023-02-22 DIAGNOSIS — M5416 Radiculopathy, lumbar region: Secondary | ICD-10-CM | POA: Diagnosis not present

## 2023-02-22 DIAGNOSIS — E782 Mixed hyperlipidemia: Secondary | ICD-10-CM | POA: Diagnosis not present

## 2023-02-22 DIAGNOSIS — G4733 Obstructive sleep apnea (adult) (pediatric): Secondary | ICD-10-CM | POA: Diagnosis not present

## 2023-02-22 DIAGNOSIS — G4700 Insomnia, unspecified: Secondary | ICD-10-CM | POA: Diagnosis not present

## 2023-02-22 DIAGNOSIS — H409 Unspecified glaucoma: Secondary | ICD-10-CM | POA: Diagnosis not present

## 2023-02-22 DIAGNOSIS — G2581 Restless legs syndrome: Secondary | ICD-10-CM | POA: Diagnosis not present

## 2023-02-22 DIAGNOSIS — M4316 Spondylolisthesis, lumbar region: Secondary | ICD-10-CM | POA: Diagnosis not present

## 2023-02-22 DIAGNOSIS — M8580 Other specified disorders of bone density and structure, unspecified site: Secondary | ICD-10-CM | POA: Diagnosis not present

## 2023-02-22 DIAGNOSIS — K5790 Diverticulosis of intestine, part unspecified, without perforation or abscess without bleeding: Secondary | ICD-10-CM | POA: Diagnosis not present

## 2023-02-22 DIAGNOSIS — M6788 Other specified disorders of synovium and tendon, other site: Secondary | ICD-10-CM | POA: Diagnosis not present

## 2023-02-22 DIAGNOSIS — J45909 Unspecified asthma, uncomplicated: Secondary | ICD-10-CM | POA: Diagnosis not present

## 2023-02-22 DIAGNOSIS — G603 Idiopathic progressive neuropathy: Secondary | ICD-10-CM | POA: Diagnosis not present

## 2023-02-22 DIAGNOSIS — K219 Gastro-esophageal reflux disease without esophagitis: Secondary | ICD-10-CM | POA: Diagnosis not present

## 2023-02-22 DIAGNOSIS — G8929 Other chronic pain: Secondary | ICD-10-CM | POA: Diagnosis not present

## 2023-02-22 DIAGNOSIS — R7303 Prediabetes: Secondary | ICD-10-CM | POA: Diagnosis not present

## 2023-02-22 DIAGNOSIS — J383 Other diseases of vocal cords: Secondary | ICD-10-CM | POA: Diagnosis not present

## 2023-02-22 DIAGNOSIS — M48061 Spinal stenosis, lumbar region without neurogenic claudication: Secondary | ICD-10-CM | POA: Diagnosis not present

## 2023-02-22 DIAGNOSIS — L4050 Arthropathic psoriasis, unspecified: Secondary | ICD-10-CM | POA: Diagnosis not present

## 2023-02-22 DIAGNOSIS — E038 Other specified hypothyroidism: Secondary | ICD-10-CM | POA: Diagnosis not present

## 2023-02-22 DIAGNOSIS — Z4789 Encounter for other orthopedic aftercare: Secondary | ICD-10-CM | POA: Diagnosis not present

## 2023-02-22 DIAGNOSIS — G43009 Migraine without aura, not intractable, without status migrainosus: Secondary | ICD-10-CM | POA: Diagnosis not present

## 2023-02-22 DIAGNOSIS — I10 Essential (primary) hypertension: Secondary | ICD-10-CM | POA: Diagnosis not present

## 2023-02-28 DIAGNOSIS — E038 Other specified hypothyroidism: Secondary | ICD-10-CM | POA: Diagnosis not present

## 2023-02-28 DIAGNOSIS — I10 Essential (primary) hypertension: Secondary | ICD-10-CM | POA: Diagnosis not present

## 2023-02-28 DIAGNOSIS — L4050 Arthropathic psoriasis, unspecified: Secondary | ICD-10-CM | POA: Diagnosis not present

## 2023-02-28 DIAGNOSIS — R7303 Prediabetes: Secondary | ICD-10-CM | POA: Diagnosis not present

## 2023-02-28 DIAGNOSIS — H409 Unspecified glaucoma: Secondary | ICD-10-CM | POA: Diagnosis not present

## 2023-02-28 DIAGNOSIS — G603 Idiopathic progressive neuropathy: Secondary | ICD-10-CM | POA: Diagnosis not present

## 2023-02-28 DIAGNOSIS — G2581 Restless legs syndrome: Secondary | ICD-10-CM | POA: Diagnosis not present

## 2023-02-28 DIAGNOSIS — K5790 Diverticulosis of intestine, part unspecified, without perforation or abscess without bleeding: Secondary | ICD-10-CM | POA: Diagnosis not present

## 2023-02-28 DIAGNOSIS — M8580 Other specified disorders of bone density and structure, unspecified site: Secondary | ICD-10-CM | POA: Diagnosis not present

## 2023-02-28 DIAGNOSIS — G4700 Insomnia, unspecified: Secondary | ICD-10-CM | POA: Diagnosis not present

## 2023-02-28 DIAGNOSIS — M48061 Spinal stenosis, lumbar region without neurogenic claudication: Secondary | ICD-10-CM | POA: Diagnosis not present

## 2023-02-28 DIAGNOSIS — M5416 Radiculopathy, lumbar region: Secondary | ICD-10-CM | POA: Diagnosis not present

## 2023-02-28 DIAGNOSIS — J45909 Unspecified asthma, uncomplicated: Secondary | ICD-10-CM | POA: Diagnosis not present

## 2023-02-28 DIAGNOSIS — G8929 Other chronic pain: Secondary | ICD-10-CM | POA: Diagnosis not present

## 2023-02-28 DIAGNOSIS — E782 Mixed hyperlipidemia: Secondary | ICD-10-CM | POA: Diagnosis not present

## 2023-02-28 DIAGNOSIS — Z4789 Encounter for other orthopedic aftercare: Secondary | ICD-10-CM | POA: Diagnosis not present

## 2023-02-28 DIAGNOSIS — J383 Other diseases of vocal cords: Secondary | ICD-10-CM | POA: Diagnosis not present

## 2023-02-28 DIAGNOSIS — M6788 Other specified disorders of synovium and tendon, other site: Secondary | ICD-10-CM | POA: Diagnosis not present

## 2023-02-28 DIAGNOSIS — G43009 Migraine without aura, not intractable, without status migrainosus: Secondary | ICD-10-CM | POA: Diagnosis not present

## 2023-02-28 DIAGNOSIS — K219 Gastro-esophageal reflux disease without esophagitis: Secondary | ICD-10-CM | POA: Diagnosis not present

## 2023-02-28 DIAGNOSIS — G4733 Obstructive sleep apnea (adult) (pediatric): Secondary | ICD-10-CM | POA: Diagnosis not present

## 2023-02-28 DIAGNOSIS — M4316 Spondylolisthesis, lumbar region: Secondary | ICD-10-CM | POA: Diagnosis not present

## 2023-03-03 DIAGNOSIS — E038 Other specified hypothyroidism: Secondary | ICD-10-CM | POA: Diagnosis not present

## 2023-03-03 DIAGNOSIS — M8580 Other specified disorders of bone density and structure, unspecified site: Secondary | ICD-10-CM | POA: Diagnosis not present

## 2023-03-03 DIAGNOSIS — M5416 Radiculopathy, lumbar region: Secondary | ICD-10-CM | POA: Diagnosis not present

## 2023-03-03 DIAGNOSIS — M48061 Spinal stenosis, lumbar region without neurogenic claudication: Secondary | ICD-10-CM | POA: Diagnosis not present

## 2023-03-03 DIAGNOSIS — K219 Gastro-esophageal reflux disease without esophagitis: Secondary | ICD-10-CM | POA: Diagnosis not present

## 2023-03-03 DIAGNOSIS — G2581 Restless legs syndrome: Secondary | ICD-10-CM | POA: Diagnosis not present

## 2023-03-03 DIAGNOSIS — G4700 Insomnia, unspecified: Secondary | ICD-10-CM | POA: Diagnosis not present

## 2023-03-03 DIAGNOSIS — G603 Idiopathic progressive neuropathy: Secondary | ICD-10-CM | POA: Diagnosis not present

## 2023-03-03 DIAGNOSIS — K5790 Diverticulosis of intestine, part unspecified, without perforation or abscess without bleeding: Secondary | ICD-10-CM | POA: Diagnosis not present

## 2023-03-03 DIAGNOSIS — M4316 Spondylolisthesis, lumbar region: Secondary | ICD-10-CM | POA: Diagnosis not present

## 2023-03-03 DIAGNOSIS — L4050 Arthropathic psoriasis, unspecified: Secondary | ICD-10-CM | POA: Diagnosis not present

## 2023-03-03 DIAGNOSIS — J383 Other diseases of vocal cords: Secondary | ICD-10-CM | POA: Diagnosis not present

## 2023-03-03 DIAGNOSIS — G8929 Other chronic pain: Secondary | ICD-10-CM | POA: Diagnosis not present

## 2023-03-03 DIAGNOSIS — J45909 Unspecified asthma, uncomplicated: Secondary | ICD-10-CM | POA: Diagnosis not present

## 2023-03-03 DIAGNOSIS — Z4789 Encounter for other orthopedic aftercare: Secondary | ICD-10-CM | POA: Diagnosis not present

## 2023-03-03 DIAGNOSIS — I10 Essential (primary) hypertension: Secondary | ICD-10-CM | POA: Diagnosis not present

## 2023-03-03 DIAGNOSIS — R7303 Prediabetes: Secondary | ICD-10-CM | POA: Diagnosis not present

## 2023-03-03 DIAGNOSIS — M6788 Other specified disorders of synovium and tendon, other site: Secondary | ICD-10-CM | POA: Diagnosis not present

## 2023-03-03 DIAGNOSIS — G43009 Migraine without aura, not intractable, without status migrainosus: Secondary | ICD-10-CM | POA: Diagnosis not present

## 2023-03-03 DIAGNOSIS — E782 Mixed hyperlipidemia: Secondary | ICD-10-CM | POA: Diagnosis not present

## 2023-03-03 DIAGNOSIS — G4733 Obstructive sleep apnea (adult) (pediatric): Secondary | ICD-10-CM | POA: Diagnosis not present

## 2023-03-03 DIAGNOSIS — H409 Unspecified glaucoma: Secondary | ICD-10-CM | POA: Diagnosis not present

## 2023-03-07 DIAGNOSIS — K219 Gastro-esophageal reflux disease without esophagitis: Secondary | ICD-10-CM | POA: Diagnosis not present

## 2023-03-07 DIAGNOSIS — G8929 Other chronic pain: Secondary | ICD-10-CM | POA: Diagnosis not present

## 2023-03-07 DIAGNOSIS — M48061 Spinal stenosis, lumbar region without neurogenic claudication: Secondary | ICD-10-CM | POA: Diagnosis not present

## 2023-03-07 DIAGNOSIS — G4733 Obstructive sleep apnea (adult) (pediatric): Secondary | ICD-10-CM | POA: Diagnosis not present

## 2023-03-07 DIAGNOSIS — J383 Other diseases of vocal cords: Secondary | ICD-10-CM | POA: Diagnosis not present

## 2023-03-07 DIAGNOSIS — E782 Mixed hyperlipidemia: Secondary | ICD-10-CM | POA: Diagnosis not present

## 2023-03-07 DIAGNOSIS — H409 Unspecified glaucoma: Secondary | ICD-10-CM | POA: Diagnosis not present

## 2023-03-07 DIAGNOSIS — K5790 Diverticulosis of intestine, part unspecified, without perforation or abscess without bleeding: Secondary | ICD-10-CM | POA: Diagnosis not present

## 2023-03-07 DIAGNOSIS — M8580 Other specified disorders of bone density and structure, unspecified site: Secondary | ICD-10-CM | POA: Diagnosis not present

## 2023-03-07 DIAGNOSIS — G603 Idiopathic progressive neuropathy: Secondary | ICD-10-CM | POA: Diagnosis not present

## 2023-03-07 DIAGNOSIS — G43009 Migraine without aura, not intractable, without status migrainosus: Secondary | ICD-10-CM | POA: Diagnosis not present

## 2023-03-07 DIAGNOSIS — M6788 Other specified disorders of synovium and tendon, other site: Secondary | ICD-10-CM | POA: Diagnosis not present

## 2023-03-07 DIAGNOSIS — M4316 Spondylolisthesis, lumbar region: Secondary | ICD-10-CM | POA: Diagnosis not present

## 2023-03-07 DIAGNOSIS — G2581 Restless legs syndrome: Secondary | ICD-10-CM | POA: Diagnosis not present

## 2023-03-07 DIAGNOSIS — J45909 Unspecified asthma, uncomplicated: Secondary | ICD-10-CM | POA: Diagnosis not present

## 2023-03-07 DIAGNOSIS — R7303 Prediabetes: Secondary | ICD-10-CM | POA: Diagnosis not present

## 2023-03-07 DIAGNOSIS — E038 Other specified hypothyroidism: Secondary | ICD-10-CM | POA: Diagnosis not present

## 2023-03-07 DIAGNOSIS — Z4789 Encounter for other orthopedic aftercare: Secondary | ICD-10-CM | POA: Diagnosis not present

## 2023-03-07 DIAGNOSIS — I10 Essential (primary) hypertension: Secondary | ICD-10-CM | POA: Diagnosis not present

## 2023-03-07 DIAGNOSIS — G4700 Insomnia, unspecified: Secondary | ICD-10-CM | POA: Diagnosis not present

## 2023-03-07 DIAGNOSIS — M5416 Radiculopathy, lumbar region: Secondary | ICD-10-CM | POA: Diagnosis not present

## 2023-03-07 DIAGNOSIS — L4050 Arthropathic psoriasis, unspecified: Secondary | ICD-10-CM | POA: Diagnosis not present

## 2023-03-17 DIAGNOSIS — M4316 Spondylolisthesis, lumbar region: Secondary | ICD-10-CM | POA: Diagnosis not present

## 2023-03-17 DIAGNOSIS — R293 Abnormal posture: Secondary | ICD-10-CM | POA: Diagnosis not present

## 2023-03-17 DIAGNOSIS — M6281 Muscle weakness (generalized): Secondary | ICD-10-CM | POA: Diagnosis not present

## 2023-03-17 DIAGNOSIS — R2681 Unsteadiness on feet: Secondary | ICD-10-CM | POA: Diagnosis not present

## 2023-03-17 DIAGNOSIS — M2560 Stiffness of unspecified joint, not elsewhere classified: Secondary | ICD-10-CM | POA: Diagnosis not present

## 2023-03-17 DIAGNOSIS — M5459 Other low back pain: Secondary | ICD-10-CM | POA: Diagnosis not present

## 2023-03-17 DIAGNOSIS — M79604 Pain in right leg: Secondary | ICD-10-CM | POA: Diagnosis not present

## 2023-03-17 DIAGNOSIS — M79605 Pain in left leg: Secondary | ICD-10-CM | POA: Diagnosis not present

## 2023-03-21 DIAGNOSIS — R293 Abnormal posture: Secondary | ICD-10-CM | POA: Diagnosis not present

## 2023-03-21 DIAGNOSIS — M79605 Pain in left leg: Secondary | ICD-10-CM | POA: Diagnosis not present

## 2023-03-21 DIAGNOSIS — R2681 Unsteadiness on feet: Secondary | ICD-10-CM | POA: Diagnosis not present

## 2023-03-21 DIAGNOSIS — M2560 Stiffness of unspecified joint, not elsewhere classified: Secondary | ICD-10-CM | POA: Diagnosis not present

## 2023-03-21 DIAGNOSIS — M79604 Pain in right leg: Secondary | ICD-10-CM | POA: Diagnosis not present

## 2023-03-21 DIAGNOSIS — M4316 Spondylolisthesis, lumbar region: Secondary | ICD-10-CM | POA: Diagnosis not present

## 2023-03-21 DIAGNOSIS — M6281 Muscle weakness (generalized): Secondary | ICD-10-CM | POA: Diagnosis not present

## 2023-03-21 DIAGNOSIS — M5459 Other low back pain: Secondary | ICD-10-CM | POA: Diagnosis not present

## 2023-03-23 DIAGNOSIS — M79605 Pain in left leg: Secondary | ICD-10-CM | POA: Diagnosis not present

## 2023-03-23 DIAGNOSIS — M2560 Stiffness of unspecified joint, not elsewhere classified: Secondary | ICD-10-CM | POA: Diagnosis not present

## 2023-03-23 DIAGNOSIS — L4059 Other psoriatic arthropathy: Secondary | ICD-10-CM | POA: Diagnosis not present

## 2023-03-23 DIAGNOSIS — Z79899 Other long term (current) drug therapy: Secondary | ICD-10-CM | POA: Diagnosis not present

## 2023-03-23 DIAGNOSIS — M6281 Muscle weakness (generalized): Secondary | ICD-10-CM | POA: Diagnosis not present

## 2023-03-23 DIAGNOSIS — M47899 Other spondylosis, site unspecified: Secondary | ICD-10-CM | POA: Diagnosis not present

## 2023-03-23 DIAGNOSIS — R2681 Unsteadiness on feet: Secondary | ICD-10-CM | POA: Diagnosis not present

## 2023-03-23 DIAGNOSIS — M79604 Pain in right leg: Secondary | ICD-10-CM | POA: Diagnosis not present

## 2023-03-23 DIAGNOSIS — M4316 Spondylolisthesis, lumbar region: Secondary | ICD-10-CM | POA: Diagnosis not present

## 2023-03-23 DIAGNOSIS — M5459 Other low back pain: Secondary | ICD-10-CM | POA: Diagnosis not present

## 2023-03-23 DIAGNOSIS — R293 Abnormal posture: Secondary | ICD-10-CM | POA: Diagnosis not present

## 2023-03-28 DIAGNOSIS — M4316 Spondylolisthesis, lumbar region: Secondary | ICD-10-CM | POA: Diagnosis not present

## 2023-03-28 DIAGNOSIS — M79604 Pain in right leg: Secondary | ICD-10-CM | POA: Diagnosis not present

## 2023-03-28 DIAGNOSIS — R293 Abnormal posture: Secondary | ICD-10-CM | POA: Diagnosis not present

## 2023-03-28 DIAGNOSIS — M5459 Other low back pain: Secondary | ICD-10-CM | POA: Diagnosis not present

## 2023-03-28 DIAGNOSIS — M6281 Muscle weakness (generalized): Secondary | ICD-10-CM | POA: Diagnosis not present

## 2023-03-28 DIAGNOSIS — R2681 Unsteadiness on feet: Secondary | ICD-10-CM | POA: Diagnosis not present

## 2023-03-28 DIAGNOSIS — M2560 Stiffness of unspecified joint, not elsewhere classified: Secondary | ICD-10-CM | POA: Diagnosis not present

## 2023-03-28 DIAGNOSIS — M79605 Pain in left leg: Secondary | ICD-10-CM | POA: Diagnosis not present

## 2023-03-31 DIAGNOSIS — M4316 Spondylolisthesis, lumbar region: Secondary | ICD-10-CM | POA: Diagnosis not present

## 2023-04-03 DIAGNOSIS — M6281 Muscle weakness (generalized): Secondary | ICD-10-CM | POA: Diagnosis not present

## 2023-04-03 DIAGNOSIS — M5459 Other low back pain: Secondary | ICD-10-CM | POA: Diagnosis not present

## 2023-04-03 DIAGNOSIS — M79605 Pain in left leg: Secondary | ICD-10-CM | POA: Diagnosis not present

## 2023-04-03 DIAGNOSIS — M4316 Spondylolisthesis, lumbar region: Secondary | ICD-10-CM | POA: Diagnosis not present

## 2023-04-03 DIAGNOSIS — M79604 Pain in right leg: Secondary | ICD-10-CM | POA: Diagnosis not present

## 2023-04-03 DIAGNOSIS — R2681 Unsteadiness on feet: Secondary | ICD-10-CM | POA: Diagnosis not present

## 2023-04-03 DIAGNOSIS — M2560 Stiffness of unspecified joint, not elsewhere classified: Secondary | ICD-10-CM | POA: Diagnosis not present

## 2023-04-03 DIAGNOSIS — R293 Abnormal posture: Secondary | ICD-10-CM | POA: Diagnosis not present

## 2023-04-06 DIAGNOSIS — M4316 Spondylolisthesis, lumbar region: Secondary | ICD-10-CM | POA: Diagnosis not present

## 2023-04-06 DIAGNOSIS — R293 Abnormal posture: Secondary | ICD-10-CM | POA: Diagnosis not present

## 2023-04-06 DIAGNOSIS — R2681 Unsteadiness on feet: Secondary | ICD-10-CM | POA: Diagnosis not present

## 2023-04-06 DIAGNOSIS — M79605 Pain in left leg: Secondary | ICD-10-CM | POA: Diagnosis not present

## 2023-04-06 DIAGNOSIS — M5459 Other low back pain: Secondary | ICD-10-CM | POA: Diagnosis not present

## 2023-04-06 DIAGNOSIS — M79604 Pain in right leg: Secondary | ICD-10-CM | POA: Diagnosis not present

## 2023-04-06 DIAGNOSIS — M2560 Stiffness of unspecified joint, not elsewhere classified: Secondary | ICD-10-CM | POA: Diagnosis not present

## 2023-04-06 DIAGNOSIS — M6281 Muscle weakness (generalized): Secondary | ICD-10-CM | POA: Diagnosis not present

## 2023-04-17 DIAGNOSIS — R293 Abnormal posture: Secondary | ICD-10-CM | POA: Diagnosis not present

## 2023-04-17 DIAGNOSIS — M79605 Pain in left leg: Secondary | ICD-10-CM | POA: Diagnosis not present

## 2023-04-17 DIAGNOSIS — M5459 Other low back pain: Secondary | ICD-10-CM | POA: Diagnosis not present

## 2023-04-17 DIAGNOSIS — M79604 Pain in right leg: Secondary | ICD-10-CM | POA: Diagnosis not present

## 2023-04-17 DIAGNOSIS — R2681 Unsteadiness on feet: Secondary | ICD-10-CM | POA: Diagnosis not present

## 2023-04-17 DIAGNOSIS — M6281 Muscle weakness (generalized): Secondary | ICD-10-CM | POA: Diagnosis not present

## 2023-04-17 DIAGNOSIS — M2560 Stiffness of unspecified joint, not elsewhere classified: Secondary | ICD-10-CM | POA: Diagnosis not present

## 2023-04-17 DIAGNOSIS — M4316 Spondylolisthesis, lumbar region: Secondary | ICD-10-CM | POA: Diagnosis not present

## 2023-04-19 DIAGNOSIS — M79605 Pain in left leg: Secondary | ICD-10-CM | POA: Diagnosis not present

## 2023-04-19 DIAGNOSIS — M5459 Other low back pain: Secondary | ICD-10-CM | POA: Diagnosis not present

## 2023-04-19 DIAGNOSIS — M4316 Spondylolisthesis, lumbar region: Secondary | ICD-10-CM | POA: Diagnosis not present

## 2023-04-19 DIAGNOSIS — R2681 Unsteadiness on feet: Secondary | ICD-10-CM | POA: Diagnosis not present

## 2023-04-19 DIAGNOSIS — R293 Abnormal posture: Secondary | ICD-10-CM | POA: Diagnosis not present

## 2023-04-19 DIAGNOSIS — M6281 Muscle weakness (generalized): Secondary | ICD-10-CM | POA: Diagnosis not present

## 2023-04-19 DIAGNOSIS — M2560 Stiffness of unspecified joint, not elsewhere classified: Secondary | ICD-10-CM | POA: Diagnosis not present

## 2023-04-19 DIAGNOSIS — M79604 Pain in right leg: Secondary | ICD-10-CM | POA: Diagnosis not present

## 2023-04-20 ENCOUNTER — Ambulatory Visit (INDEPENDENT_AMBULATORY_CARE_PROVIDER_SITE_OTHER): Payer: Medicare Other | Admitting: Allergy and Immunology

## 2023-04-20 ENCOUNTER — Encounter: Payer: Self-pay | Admitting: Allergy and Immunology

## 2023-04-20 VITALS — BP 132/74 | HR 88 | Resp 16 | Ht 60.2 in | Wt 158.2 lb

## 2023-04-20 DIAGNOSIS — J3089 Other allergic rhinitis: Secondary | ICD-10-CM | POA: Diagnosis not present

## 2023-04-20 DIAGNOSIS — K219 Gastro-esophageal reflux disease without esophagitis: Secondary | ICD-10-CM | POA: Diagnosis not present

## 2023-04-20 DIAGNOSIS — J4530 Mild persistent asthma, uncomplicated: Secondary | ICD-10-CM | POA: Diagnosis not present

## 2023-04-20 IMAGING — MR MR [PERSON_NAME]*[PERSON_NAME]* W/O CM
5 series · 40 of 40 positions shown · non-contrast
Comparison: None.

CLINICAL DATA: Left hand atrophy

EXAM:
MRI OF THE LEFT HAND WITHOUT CONTRAST
TECHNIQUE: Multiplanar, multisequence MR imaging of the left hand was
performed. No intravenous contrast was administered.

[Series 3: T1 · axial · left · 4.0mm · 0.41mm/px · z∈[-106,+100]mm · 11 of 48 slices shown (1 of 2)]
[im 1/48]
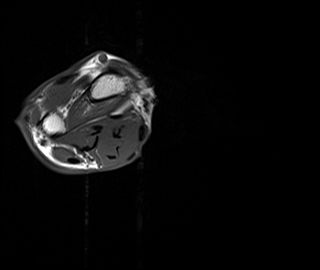
[im 5/48]
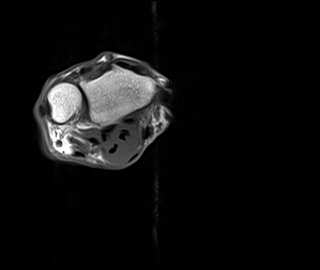
[im 10/48]
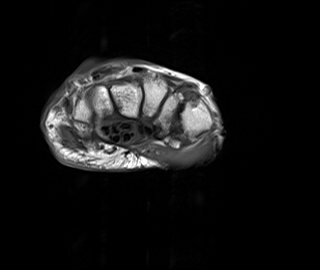
[im 15/48]
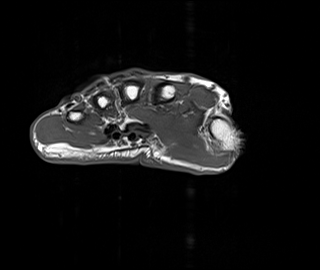
[im 19/48]
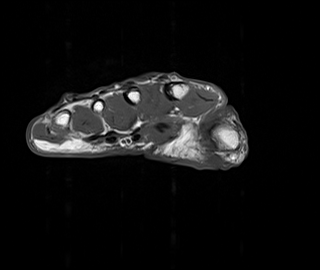
[im 24/48]
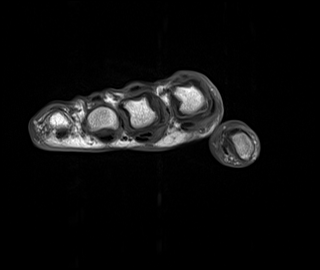
[im 29/48]
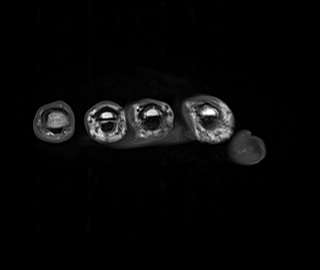
[im 33/48]
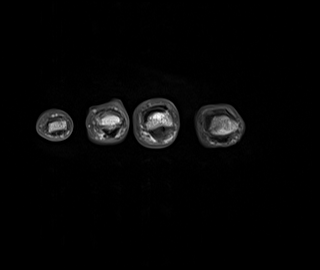
[im 38/48]
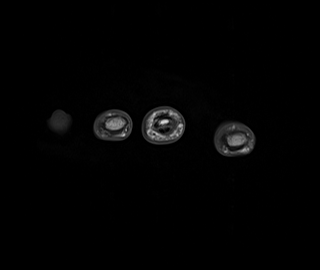
[im 43/48]
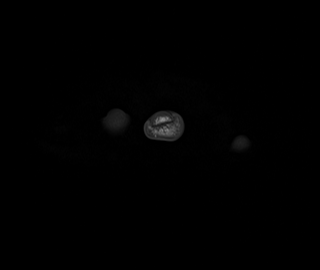
[im 48/48]
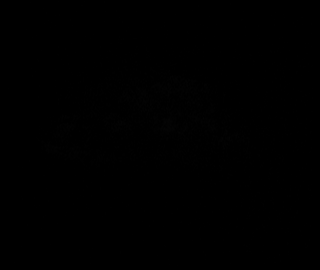

[Series 4: T2 fat-sat · axial · left · 4.0mm · 0.41mm/px · z∈[-97,+93]mm · 11 of 46 slices shown]
[im 1/46]
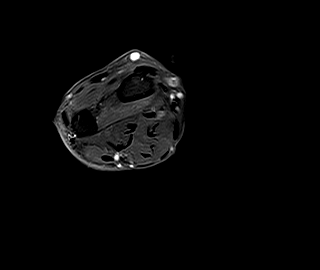
[im 5/46]
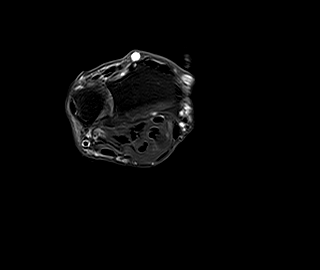
[im 10/46]
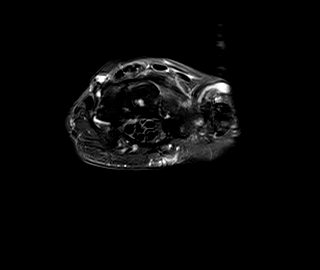
[im 14/46]
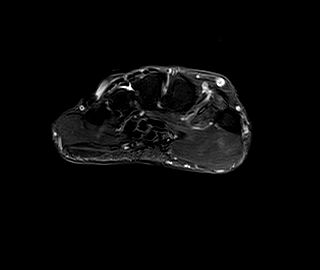
[im 19/46]
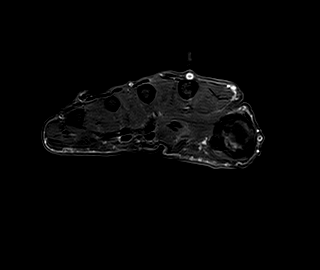
[im 23/46]
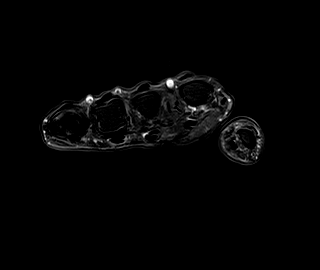
[im 28/46]
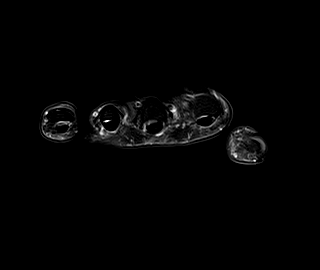
[im 32/46]
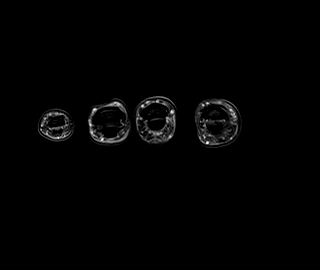
[im 37/46]
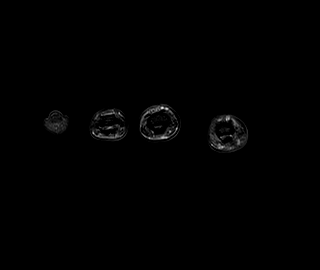
[im 41/46]
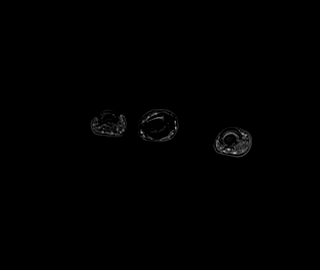
[im 46/46]
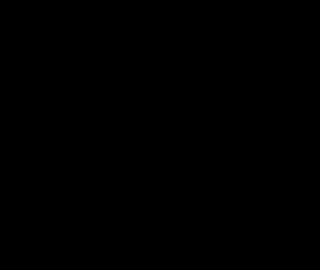

[Series 5: T1 · coronal · left · 3.0mm · 0.69mm/px · 6 of 24 slices shown (2 of 2)]
[im 1/24]
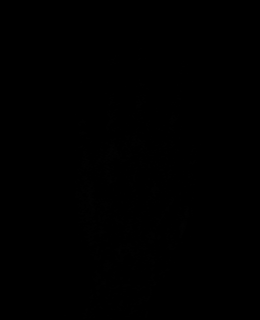
[im 5/24]
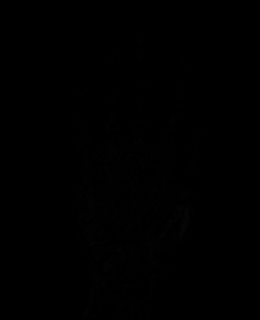
[im 10/24]
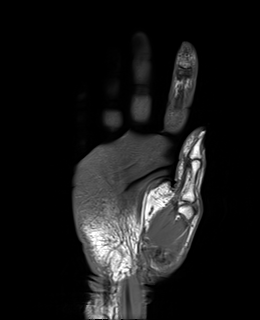
[im 14/24]
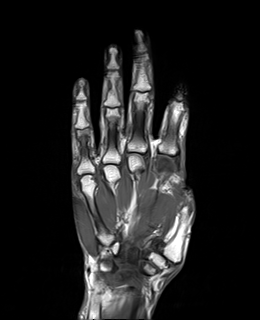
[im 19/24]
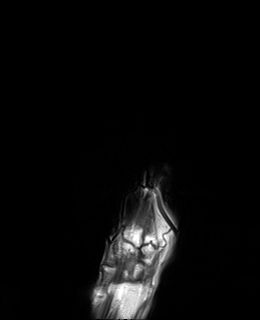
[im 24/24]
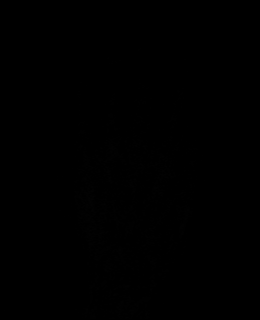

[Series 6: STIR · coronal · left · 3.0mm · 0.69mm/px · 5 of 23 slices shown]
[im 1/23]
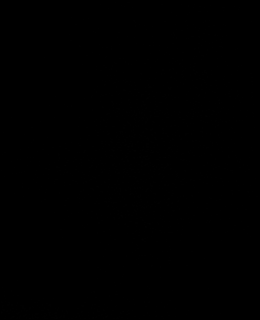
[im 6/23]
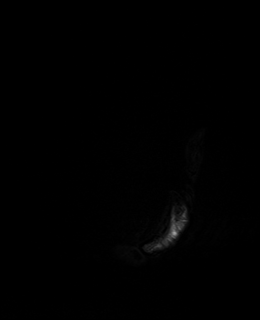
[im 12/23]
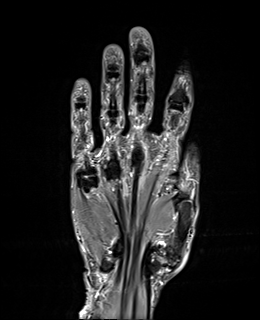
[im 17/23]
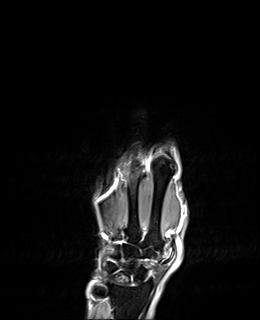
[im 23/23]
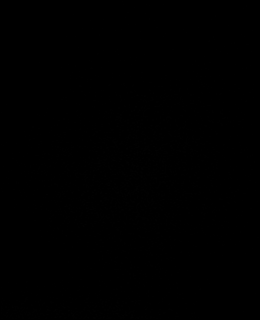

[Series 7: PD fat-sat · sagittal · left · 3.0mm · 0.69mm/px · 7 of 32 slices shown]
[im 1/32]
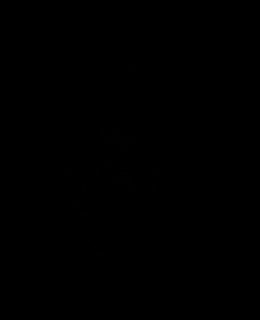
[im 6/32]
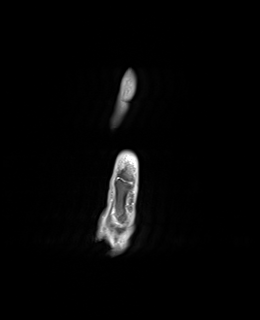
[im 11/32]
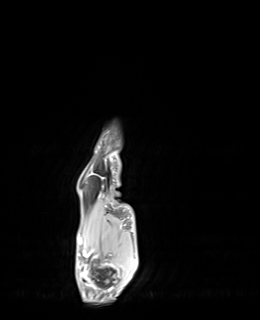
[im 16/32]
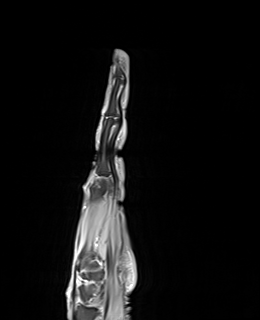
[im 21/32]
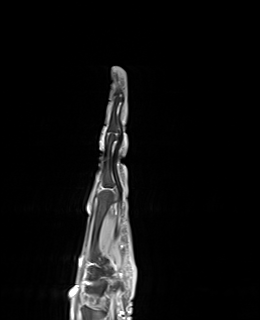
[im 26/32]
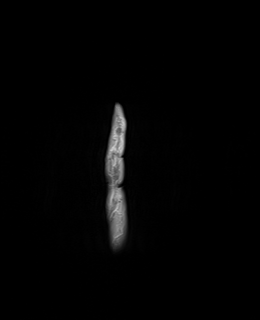
[im 32/32]
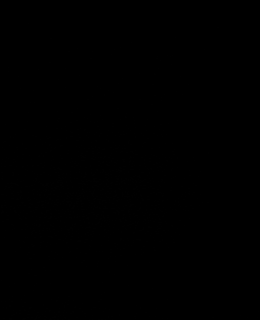

[40 of 40 positions shown; findings below may reference images not displayed]

FINDINGS: Bones/Joint/Cartilage

No acute fracture. No dislocation. Bone marrow edema throughout the
lunate without discrete fracture line. Subchondral cyst or
intraosseous ganglion within the proximal pole of scaphoid. Severe
degenerative changes of the first carpometacarpal joint with joint
space loss, subchondral cystic change, and marginal osteophyte
formation. Slight radial subluxation at the first CMC joint.
Moderate arthropathy at the triscaphe joint. No definite erosion. No
joint effusion or evidence of synovitis. No suspicious bone lesion.

Ligaments

No evidence of acute ligamentous abnormality.

Muscles and Tendons

Normal muscle bulk and signal intensity without edema, atrophy, or
fatty infiltration. Intact flexor and extensor tendons without focal
tendinosis, tear, or tenosynovitis.

Soft tissues

No soft tissue edema or fluid collection.  No soft tissue mass.
IMPRESSION: 1. Bone marrow edema throughout the lunate without discrete fracture
line. Findings are nonspecific and may be degenerative/reactive,
secondary to contusion, or potentially related to ulnar impaction
syndrome. Evaluation of ulnar variance is limited by positioning.
Recommend dedicated radiographs of the left wrist to better evaluate
osseous alignment.
2. Severe degenerative changes of the first carpometacarpal joint.
Moderate arthropathy of the triscaphe joint.
3. No muscle atrophy or denervation changes.

## 2023-04-20 MED ORDER — QVAR REDIHALER 40 MCG/ACT IN AERB: INHALATION_SPRAY | RESPIRATORY_TRACT | 5 refills | Status: DC

## 2023-04-20 MED ORDER — PANTOPRAZOLE SODIUM 40 MG PO TBEC: DELAYED_RELEASE_TABLET | ORAL | 1 refills | Status: DC

## 2023-04-20 NOTE — Patient Instructions (Addendum)
  1.  Treat and prevent inflammation:  A. Stop Arnuity B. Start Qvar 40 - 2 inhalations 1-2 times per day  B. Budesonide - 2 sprays each nostril 1 time per day  2.  Treat and prevent reflux/LPR:  A. INCREASE Pantoprazole 40 mg - 1 tablet 2 time per day B. Famotidine 40 mg - 1 tablet in evening  3.  If needed:  A. Nasal saline B. OTC Cetirizine 10 mg - 1 tablet 1 time per day C. Albuterol HFA - 2 inhalations every 4-6 hours  4.  Return in 4 weeks or earlier if problem.    5. Plan for fall flu vaccine

## 2023-04-20 NOTE — Progress Notes (Signed)
[name] [facility] [place] - [name] - [place] Olivia Cruz   Follow-up Note  Referring Provider: Deatra James, MD Primary Provider: Deatra James, MD Date of Office Visit: 04/20/2023  Subjective:   Olivia Cruz (DOB: 08-04-1951) is a 72 y.o. female [facility] in re-evaluation of the following:  HPI: Nasteha returns to this clinic in evaluation of asthma, allergic rhinitis, LPR, immunosuppression.  I last saw her in this clinic 13 October 2022.  She underwent back surgery and she did have a postop cough that went on forever and she still has a little bit of cough especially at nighttime and she has noticed that her voice has been raspy as well.  It should be noted that she is now using a powdered form of inhaled steroid because her insurance company denied the use of her HFA formulation.  She has had very little problems with her upper airways while using a nasal steroid.  She has had very little problems with classic reflux symptoms while using pantoprazole and famotidine.  She continues on her Humira for arthritis.  Allergies as of 04/20/2023       Reactions   Codeine Anaphylaxis, Nausea Only   Ambien [zolpidem Tartrate] Other (See Comments)   Memory loss per patient   Clonazepam    Other Reaction(s): Fatique, Mental Status Changes   Cyproheptadine Hcl Other (See Comments)   Confusion   Dilaudid [hydromorphone]    hallucinations   Hydrocodone Other (See Comments)   anaphylaxis   Lisinopril Cough   Pramipexole Nausea Only   Sulfa Antibiotics Other (See Comments)   hives   Tramadol Nausea And Vomiting        Medication List    albuterol 108 (90 Base) MCG/ACT inhaler Commonly known as: VENTOLIN HFA Inhale 2 puffs into the lungs every 6 (six) hours as needed for wheezing or shortness of breath.   Aleve 220 MG tablet Generic drug: naproxen sodium   Arnuity Ellipta 200 MCG/ACT Aepb Generic drug: Fluticasone  Furoate Inhale one dose once daily to prevent cough or wheeze.  Rinse, gargle, and spit after use.   ascorbic acid 500 MG tablet Commonly known as: VITAMIN C Take 500 mg by mouth daily.   budesonide 32 MCG/ACT nasal spray Commonly known as: RHINOCORT AQUA Place 1 spray into both nostrils daily. Reported on 02/04/2016   CALCIUM MAGNESIUM ZINC PO Take by mouth.   Calcium-Vitamin D-Vitamin K [phone] MG-UNT-MCG Tabs Take 1 tablet by mouth daily.   cetirizine 10 MG tablet Commonly known as: ZYRTEC Take 10 mg by mouth daily as needed for allergies.   clindamycin 300 MG capsule Commonly known as: CLEOCIN Take 300 mg by mouth as needed. AS NEEDED FOR DENTAL PROCEDURE   diclofenac Sodium 1 % Gel Commonly known as: VOLTAREN Apply 1 Application topically as needed for pain (Knee pain).   DULoxetine 60 MG capsule Commonly known as: Cymbalta Take 1 capsule (60 mg total) by mouth 2 (two) times daily.   famotidine 40 MG tablet Commonly known as: PEPCID TAKE 1 TABLET BY MOUTH DAILY AT  BEDTIME   gabapentin 300 MG capsule Commonly known as: NEURONTIN Take 300 mg by mouth in the morning, at noon, and at bedtime.   HAIR/SKIN/NAILS PO Take 1 tablet by mouth daily.   Humira (2 Pen) 40 MG/0.4ML pen Generic drug: adalimumab Inject 40 mg into the skin once a week. Unsure if dose is 40 mg- every other week  levothyroxine 50 MCG tablet Commonly known as: SYNTHROID TAKE 1 TABLET BY MOUTH DAILY   Magnesium 500 MG Tabs Take 500 mg by mouth daily.   methocarbamol 500 MG tablet Commonly known as: ROBAXIN Take 1 tablet (500 mg total) by mouth every 6 (six) hours as needed for muscle spasms.   multivitamin with minerals Tabs tablet Take 1 tablet by mouth daily. Woman 50+   nitrofurantoin 100 MG capsule Commonly known as: MACRODANTIN Take 100 mg by mouth as needed (after  intercourse).   nitroGLYCERIN 0.4 MG SL tablet Commonly known as: NITROSTAT Place 1 tablet (0.4 mg total)  under the tongue every 5 (five) minutes as needed for chest pain.   ondansetron 4 MG tablet Commonly known as: ZOFRAN Take 1 tablet (4 mg total) by mouth every 6 (six) hours as needed for nausea or vomiting.   oxyCODONE-acetaminophen 5-325 MG tablet Commonly known as: PERCOCET/ROXICET Take 1-2 tablets by mouth every 6 (six) hours as needed for severe pain or moderate pain.   pantoprazole 40 MG tablet Commonly known as: PROTONIX TAKE 1 TABLET BY MOUTH DAILY   Systane Complete PF 0.6 % Soln Generic drug: Propylene Glycol (PF) Place 1 drop into both eyes daily as needed (dry eyes).   valACYclovir 500 MG tablet Commonly known as: VALTREX Take 1 tablet (500 mg total) by mouth 3 (three) times daily as needed (herpes outbreak).    Past Medical History:  Diagnosis Date   Achilles tendonosis 10/06/2020   Acute cystitis with hematuria 09/29/2019   Acute infection of nasal sinus 06/21/2021   Allergic rhinoconjunctivitis    Anxiety    Asthma    Cervical os stenosis 04/09/2015   Chest pain    Chronic back pain 09/23/2021   Chronic knee pain after total replacement of right knee joint 07/21/2020   Chronic right shoulder pain 05/25/2021   Depression    Depression, major, recurrent, mild (HCC)    Diverticulitis    Dysuria 07/12/2021   GERD (gastroesophageal reflux disease)    Glaucoma    Heart murmur    Hypercholesterolemia    Hypothyroidism    Insomnia    Laryngopharyngeal reflux (LPR)    Memory changes    Migraine without aura and without status migrainosus, not intractable 05/07/2021   Migraines    Mild recurrent major depression (HCC) 07/12/2021   Mixed hyperlipidemia 12/22/2019   Muscle wasting and atrophy, not elsewhere classified, left hand 12/01/2020   Osteoarthritis    Osteopenia    Postmenopausal atrophic vaginitis 04/09/2015   Prediabetes 12/22/2019   Psoriatic arthritis (HCC)    RLS (restless legs syndrome)    Seasonal allergic rhinitis due to pollen 06/21/2021    Secondary hypothyroidism 12/22/2019   Skin cancer    Spondylolisthesis at L4-L5 level 04/27/2022   Status post total right knee replacement 12/29/2021   STD (sexually transmitted disease)    HSV II   Tendinopathy of right rotator cuff 05/25/2021   Vocal fold atrophy 09/12/2018    Past Surgical History:  Procedure Laterality Date   BACK SURGERY  2024   fusion of L4 and L5   BELPHAROPTOSIS REPAIR     BREAST SURGERY Left    times 2   CARDIAC CATHETERIZATION  10/05/2015   COLONOSCOPY  06/03/2013   Moderate predominantly sigmoid diverticulosis. Small interal hemorroids   FOOT SURGERY Right    Removed bone spur   GANGLION CYST EXCISION Left    foot   HEMORRHOID SURGERY     SKIN CANCER  EXCISION     TOTAL KNEE ARTHROPLASTY Right 07/21/2020   UPPER GI ENDOSCOPY  03/23/2016   Mild gastritis, gastric polyps bxbenign squamous mucosa with no abnormaility, fundic glad poly in setting of mild chron gastritis.    Review of systems negative except as noted in HPI / PMHx or noted below:  Review of Systems  Constitutional: Negative.   HENT: Negative.    Eyes: Negative.   Respiratory: Negative.    Cardiovascular: Negative.   Gastrointestinal: Negative.   Genitourinary: Negative.   Musculoskeletal: Negative.   Skin: Negative.   Neurological: Negative.   Endo/Heme/Allergies: Negative.   Psychiatric/Behavioral: Negative.       Objective:   Vitals:   04/20/23 1132  BP: 132/74  Pulse: 88  Resp: 16  SpO2: 94%   Height: 5' 0.2" (152.9 cm)  Weight: 158 lb 3.2 oz (71.8 kg)   Physical Exam Constitutional:      Appearance: She is not diaphoretic.  HENT:     Head: Normocephalic.     Right Ear: Tympanic membrane, ear canal and external ear normal.     Left Ear: Tympanic membrane, ear canal and external ear normal.     Nose: Nose normal. No mucosal edema or rhinorrhea.     Mouth/Throat:     Pharynx: Uvula midline. No oropharyngeal exudate.  Eyes:     Conjunctiva/sclera:  Conjunctivae normal.  Neck:     Thyroid: No thyromegaly.     Trachea: Trachea normal. No tracheal tenderness or tracheal deviation.  Cardiovascular:     Rate and Rhythm: Normal rate and regular rhythm.     Heart sounds: Normal heart sounds, S1 normal and S2 normal. No murmur heard. Pulmonary:     Effort: No respiratory distress.     Breath sounds: Normal breath sounds. No stridor. No wheezing or rales.  Lymphadenopathy:     Head:     Right side of head: No tonsillar adenopathy.     Left side of head: No tonsillar adenopathy.     Cervical: No cervical adenopathy.  Skin:    Findings: No erythema or rash.     Nails: There is no clubbing.  Neurological:     Mental Status: She is alert.     Diagnostics: none  Assessment and Plan:   1. Asthma, well controlled, mild persistent   2. Perennial allergic rhinitis   3. LPRD (laryngopharyngeal reflux disease)    1.  Treat and prevent inflammation:  A. Stop Arnuity B. Start Qvar 40 - 2 inhalations 1-2 times per day w/ spacer B. Budesonide - 2 sprays each nostril 1 time per day  2.  Treat and prevent reflux/LPR:  A. INCREASE Pantoprazole 40 mg - 1 tablet 2 time per day B. Famotidine 40 mg - 1 tablet in evening  3.  If needed:  A. Nasal saline B. OTC Cetirizine 10 mg - 1 tablet 1 time per day C. Albuterol HFA - 2 inhalations every 4-6 hours  4.  Return in 4 weeks or earlier if problem.    5. Plan for fall flu vaccine  Levinia appears to have an irritant effect most likely from her intubation, anesthetic gas, powdered inhaler use, on the baseline of some asthma and LPR.  We will remove her powdered inhaler and we will increase her therapy for LPR and I will see her back in this clinic in 4 weeks to make a decision about further evaluation and treatment regarding her chronic respiratory tract symptoms.  Laurette Schimke, MD  Allergy / Immunology [facility]

## 2023-04-25 ENCOUNTER — Encounter: Payer: Self-pay | Admitting: Allergy and Immunology

## 2023-04-26 DIAGNOSIS — M6281 Muscle weakness (generalized): Secondary | ICD-10-CM | POA: Diagnosis not present

## 2023-04-26 DIAGNOSIS — R293 Abnormal posture: Secondary | ICD-10-CM | POA: Diagnosis not present

## 2023-04-26 DIAGNOSIS — R2681 Unsteadiness on feet: Secondary | ICD-10-CM | POA: Diagnosis not present

## 2023-04-26 DIAGNOSIS — M2560 Stiffness of unspecified joint, not elsewhere classified: Secondary | ICD-10-CM | POA: Diagnosis not present

## 2023-04-26 DIAGNOSIS — M79605 Pain in left leg: Secondary | ICD-10-CM | POA: Diagnosis not present

## 2023-04-26 DIAGNOSIS — M79604 Pain in right leg: Secondary | ICD-10-CM | POA: Diagnosis not present

## 2023-04-26 DIAGNOSIS — M4316 Spondylolisthesis, lumbar region: Secondary | ICD-10-CM | POA: Diagnosis not present

## 2023-05-01 DIAGNOSIS — M79605 Pain in left leg: Secondary | ICD-10-CM | POA: Diagnosis not present

## 2023-05-01 DIAGNOSIS — M2560 Stiffness of unspecified joint, not elsewhere classified: Secondary | ICD-10-CM | POA: Diagnosis not present

## 2023-05-01 DIAGNOSIS — R2681 Unsteadiness on feet: Secondary | ICD-10-CM | POA: Diagnosis not present

## 2023-05-01 DIAGNOSIS — M79604 Pain in right leg: Secondary | ICD-10-CM | POA: Diagnosis not present

## 2023-05-01 DIAGNOSIS — R293 Abnormal posture: Secondary | ICD-10-CM | POA: Diagnosis not present

## 2023-05-01 DIAGNOSIS — M4316 Spondylolisthesis, lumbar region: Secondary | ICD-10-CM | POA: Diagnosis not present

## 2023-05-01 DIAGNOSIS — M6281 Muscle weakness (generalized): Secondary | ICD-10-CM | POA: Diagnosis not present

## 2023-05-04 DIAGNOSIS — M79604 Pain in right leg: Secondary | ICD-10-CM | POA: Diagnosis not present

## 2023-05-04 DIAGNOSIS — R2681 Unsteadiness on feet: Secondary | ICD-10-CM | POA: Diagnosis not present

## 2023-05-04 DIAGNOSIS — M2560 Stiffness of unspecified joint, not elsewhere classified: Secondary | ICD-10-CM | POA: Diagnosis not present

## 2023-05-04 DIAGNOSIS — M6281 Muscle weakness (generalized): Secondary | ICD-10-CM | POA: Diagnosis not present

## 2023-05-04 DIAGNOSIS — R293 Abnormal posture: Secondary | ICD-10-CM | POA: Diagnosis not present

## 2023-05-04 DIAGNOSIS — M4316 Spondylolisthesis, lumbar region: Secondary | ICD-10-CM | POA: Diagnosis not present

## 2023-05-04 DIAGNOSIS — M79605 Pain in left leg: Secondary | ICD-10-CM | POA: Diagnosis not present

## 2023-05-19 ENCOUNTER — Other Ambulatory Visit: Payer: Self-pay | Admitting: Allergy and Immunology

## 2023-05-24 DIAGNOSIS — L4050 Arthropathic psoriasis, unspecified: Secondary | ICD-10-CM | POA: Diagnosis not present

## 2023-05-24 DIAGNOSIS — M1712 Unilateral primary osteoarthritis, left knee: Secondary | ICD-10-CM | POA: Diagnosis not present

## 2023-05-24 DIAGNOSIS — M175 Other unilateral secondary osteoarthritis of knee: Secondary | ICD-10-CM | POA: Diagnosis not present

## 2023-05-25 ENCOUNTER — Ambulatory Visit (INDEPENDENT_AMBULATORY_CARE_PROVIDER_SITE_OTHER): Payer: Medicare Other | Admitting: Allergy and Immunology

## 2023-05-25 VITALS — BP 132/74 | HR 92 | Temp 97.8°F | Resp 16

## 2023-05-25 DIAGNOSIS — J4530 Mild persistent asthma, uncomplicated: Secondary | ICD-10-CM

## 2023-05-25 DIAGNOSIS — R5383 Other fatigue: Secondary | ICD-10-CM | POA: Diagnosis not present

## 2023-05-25 DIAGNOSIS — J3089 Other allergic rhinitis: Secondary | ICD-10-CM

## 2023-05-25 DIAGNOSIS — K219 Gastro-esophageal reflux disease without esophagitis: Secondary | ICD-10-CM

## 2023-05-25 MED ORDER — QVAR REDIHALER 40 MCG/ACT IN AERB: INHALATION_SPRAY | RESPIRATORY_TRACT | 1 refills | Status: DC

## 2023-05-25 MED ORDER — FAMOTIDINE 40 MG PO TABS: ORAL_TABLET | ORAL | 1 refills | Status: DC

## 2023-05-25 MED ORDER — CETIRIZINE HCL 10 MG PO TABS: 10.0000 mg | ORAL_TABLET | Freq: Every day | ORAL | 1 refills | Status: AC | PRN

## 2023-05-25 MED ORDER — PANTOPRAZOLE SODIUM 40 MG PO TBEC: DELAYED_RELEASE_TABLET | ORAL | 1 refills | Status: DC

## 2023-05-25 MED ORDER — ALBUTEROL SULFATE HFA 108 (90 BASE) MCG/ACT IN AERS: 2.0000 | INHALATION_SPRAY | RESPIRATORY_TRACT | 1 refills | Status: DC | PRN

## 2023-05-25 NOTE — Progress Notes (Signed)
[place] - [place] - [place] - [name]   Follow-up Note  Referring Provider: Deatra James, MD Primary Provider: Deatra James, MD Date of Office Visit: 05/25/2023  Subjective:   Olivia Cruz (DOB: 1951/04/27) is a 72 y.o. female who [facility]23 in re-evaluation of the following:  HPI: Olivia Cruz returns to this clinic in evaluation of asthma, allergic rhinitis, LPR, immunosuppression with Humira.  I last saw her in this clinic 20 April 2023.  When I last saw her in this clinic she was having issues with cough and she was having raspy voice and we eliminated her powdered steroid inhaler and increased her therapy for reflux and she has resolved her cough and her voice is better.  Allergies as of 05/25/2023       Reactions   Codeine Anaphylaxis, Nausea Only   Ambien [zolpidem Tartrate] Other (See Comments)   Memory loss per patient   Clonazepam    Other Reaction(s): Fatique, Mental Status Changes   Cyproheptadine Hcl Other (See Comments)   Confusion   Dilaudid [hydromorphone]    hallucinations   Hydrocodone Other (See Comments)   anaphylaxis   Lisinopril Cough   Pramipexole Nausea Only   Sulfa Antibiotics Other (See Comments)   hives   Tramadol Nausea And Vomiting        Medication List    albuterol 108 (90 Base) MCG/ACT inhaler Commonly known as: VENTOLIN HFA Inhale 2 puffs into the lungs every 6 (six) hours as needed for wheezing or shortness of breath.   Aleve 220 MG tablet Generic drug: naproxen sodium   ascorbic acid 500 MG tablet Commonly known as: VITAMIN C Take 500 mg by mouth daily.   budesonide 32 MCG/ACT nasal spray Commonly known as: RHINOCORT AQUA Place 1 spray into both nostrils daily. Reported on 02/04/2016 What changed: when to take this   CALCIUM MAGNESIUM ZINC PO Take by mouth.   Calcium-Vitamin D-Vitamin K [phone] MG-UNT-MCG Tabs Take 1 tablet by mouth daily.    cetirizine 10 MG tablet Commonly known as: ZYRTEC Take 10 mg by mouth daily as needed for allergies.   clindamycin 300 MG capsule Commonly known as: CLEOCIN Take 300 mg by mouth as needed. AS NEEDED FOR DENTAL PROCEDURE   diclofenac Sodium 1 % Gel Commonly known as: VOLTAREN Apply 1 Application topically as needed for pain (Knee pain).   DULoxetine 60 MG capsule Commonly known as: Cymbalta Take 1 capsule (60 mg total) by mouth 2 (two) times daily.   famotidine 40 MG tablet Commonly known as: PEPCID TAKE 1 TABLET BY MOUTH DAILY AT  BEDTIME   gabapentin 300 MG capsule Commonly known as: NEURONTIN Take 300 mg by mouth in the morning, at noon, and at bedtime.   HAIR/SKIN/NAILS PO Take 1 tablet by mouth daily.   Humira (2 Pen) 40 MG/0.4ML pen Generic drug: adalimumab Inject 40 mg into the skin once a week. Unsure if dose is 40 mg- every other week   levothyroxine 50 MCG tablet Commonly known as: SYNTHROID TAKE 1 TABLET BY MOUTH DAILY   Magnesium 500 MG Tabs Take 500 mg by mouth daily.   methocarbamol 500 MG tablet Commonly known as: ROBAXIN Take 1 tablet (500 mg total) by mouth every 6 (six) hours as needed for muscle spasms.   multivitamin with minerals Tabs tablet Take 1 tablet by mouth daily. Woman 50+   nitrofurantoin 100 MG capsule Commonly known as: MACRODANTIN Take 100 mg by  mouth as needed (after  intercourse).   nitroGLYCERIN 0.4 MG SL tablet Commonly known as: NITROSTAT Place 1 tablet (0.4 mg total) under the tongue every 5 (five) minutes as needed for chest pain.   ondansetron 4 MG tablet Commonly known as: ZOFRAN Take 1 tablet (4 mg total) by mouth every 6 (six) hours as needed for nausea or vomiting.   oxyCODONE-acetaminophen 5-325 MG tablet Commonly known as: PERCOCET/ROXICET Take 1-2 tablets by mouth every 6 (six) hours as needed for severe pain or moderate pain.   pantoprazole 40 MG tablet Commonly known as: PROTONIX TAKE 1 TABLET BY MOUTH  TWICE A DAY   Qvar RediHaler 40 MCG/ACT inhaler Generic drug: beclomethasone Inhale two puffs one to two times daily to prevent cough or wheeze.  Rinse, gargle, and spit after use.   Systane Complete PF 0.6 % Soln Generic drug: Propylene Glycol (PF) Place 1 drop into both eyes daily as needed (dry eyes).   valACYclovir 500 MG tablet Commonly known as: VALTREX Take 1 tablet (500 mg total) by mouth 3 (three) times daily as needed (herpes outbreak).        Past Medical History:  Diagnosis Date   Achilles tendonosis 10/06/2020   Acute cystitis with hematuria 09/29/2019   Acute infection of nasal sinus 06/21/2021   Allergic rhinoconjunctivitis    Anxiety    Asthma    Cervical os stenosis 04/09/2015   Chest pain    Chronic back pain 09/23/2021   Chronic knee pain after total replacement of right knee joint 07/21/2020   Chronic right shoulder pain 05/25/2021   Depression    Depression, major, recurrent, mild (HCC)    Diverticulitis    Dysuria 07/12/2021   GERD (gastroesophageal reflux disease)    Glaucoma    Heart murmur    Hypercholesterolemia    Hypothyroidism    Insomnia    Laryngopharyngeal reflux (LPR)    Memory changes    Migraine without aura and without status migrainosus, not intractable 05/07/2021   Migraines    Mild recurrent major depression (HCC) 07/12/2021   Mixed hyperlipidemia 12/22/2019   Muscle wasting and atrophy, not elsewhere classified, left hand 12/01/2020   Osteoarthritis    Osteopenia    Postmenopausal atrophic vaginitis 04/09/2015   Prediabetes 12/22/2019   Psoriatic arthritis (HCC)    RLS (restless legs syndrome)    Seasonal allergic rhinitis due to pollen 06/21/2021   Secondary hypothyroidism 12/22/2019   Skin cancer    Spondylolisthesis at L4-L5 level 04/27/2022   Status post total right knee replacement 12/29/2021   STD (sexually transmitted disease)    HSV II   Tendinopathy of right rotator cuff 05/25/2021   Vocal fold atrophy  09/12/2018    Past Surgical History:  Procedure Laterality Date   BACK SURGERY  2024   fusion of L4 and L5   BELPHAROPTOSIS REPAIR     BREAST SURGERY Left    times 2   CARDIAC CATHETERIZATION  10/05/2015   COLONOSCOPY  06/03/2013   Moderate predominantly sigmoid diverticulosis. Small interal hemorroids   FOOT SURGERY Right    Removed bone spur   GANGLION CYST EXCISION Left    foot   HEMORRHOID SURGERY     SKIN CANCER EXCISION     TOTAL KNEE ARTHROPLASTY Right 07/21/2020   UPPER GI ENDOSCOPY  03/23/2016   Mild gastritis, gastric polyps bxbenign squamous mucosa with no abnormaility, fundic glad poly in setting of mild chron gastritis.    Review of systems negative except  as noted in HPI / PMHx or noted below:  Review of Systems  Constitutional: Negative.   HENT: Negative.    Eyes: Negative.   Respiratory: Negative.    Cardiovascular: Negative.   Gastrointestinal: Negative.   Genitourinary: Negative.   Musculoskeletal: Negative.   Skin: Negative.   Neurological: Negative.   Endo/Heme/Allergies: Negative.   Psychiatric/Behavioral: Negative.       Objective:   Vitals:   05/25/23 1047  BP: 132/74  Pulse: 92  Resp: 16  Temp: 97.8 F (36.6 C)  SpO2: 96%          Physical Exam Constitutional:      Appearance: She is not diaphoretic.  HENT:     Head: Normocephalic.     Right Ear: Tympanic membrane, ear canal and external ear normal.     Left Ear: Tympanic membrane, ear canal and external ear normal.     Nose: Nose normal. No mucosal edema or rhinorrhea.     Mouth/Throat:     Pharynx: Uvula midline. No oropharyngeal exudate.  Eyes:     Conjunctiva/sclera: Conjunctivae normal.  Neck:     Thyroid: No thyromegaly.     Trachea: Trachea normal. No tracheal tenderness or tracheal deviation.  Cardiovascular:     Rate and Rhythm: Normal rate and regular rhythm.     Heart sounds: Normal heart sounds, S1 normal and S2 normal. No murmur heard. Pulmonary:      Effort: No respiratory distress.     Breath sounds: Normal breath sounds. No stridor. No wheezing or rales.  Lymphadenopathy:     Head:     Right side of head: No tonsillar adenopathy.     Left side of head: No tonsillar adenopathy.     Cervical: No cervical adenopathy.  Skin:    Findings: No erythema or rash.     Nails: There is no clubbing.  Neurological:     Mental Status: She is alert.     Diagnostics: none  Assessment and Plan:   1. Asthma, well controlled, mild persistent   2. Perennial allergic rhinitis   3. LPRD (laryngopharyngeal reflux disease)   4. Other fatigue    1.  Treat and prevent inflammation:  A. Qvar 40 - 2 inhalations 1-2 times per day  B. Budesonide - 2 sprays each nostril 3-7 times per week  2.  Treat and prevent reflux/LPR:  A. Pantoprazole 40 mg - 1 tablet 1-2 time per day B. Famotidine 40 mg - 1 tablet in evening  3.  If needed:  A. Nasal saline B. OTC Cetirizine 10 mg - 1 tablet 1 time per day (sedation???) C. Albuterol HFA - 2 inhalations every 4-6 hours  4.  Return in 12 weeks or earlier if problem.    5. Plan for fall flu vaccine  Sharlynn is certainly doing a lot better at this point in time on her current therapy and we are going to keep her on anti-inflammatory agents for her airway and therapy directed against LPR as noted above.  I think around 12 weeks or so we will be able to consolidate her pantoprazole to once a day but I have asked her to use her pantoprazole twice a day at least for the next 2 months.  She also related a history of being sedated somewhat during the daytime and she is on cetirizine and Robaxin and gabapentin and I mentioned to her that she can start to taper some of her medications initially with cetirizine to see if  this is responsible for that issue.  Then she will need to work through her other doctors to see if she can taper her other medications.  I will review her blood test to see what type of evaluation has  been done about her fatigue.  Laurette Schimke, MD Allergy / Immunology [facility]

## 2023-05-25 NOTE — Patient Instructions (Addendum)
  1.  Treat and prevent inflammation:  A. Qvar 40 - 2 inhalations 1-2 times per day  B. Budesonide - 2 sprays each nostril 3-7 times per week  2.  Treat and prevent reflux/LPR:  A. Pantoprazole 40 mg - 1 tablet 1-2 time per day B. Famotidine 40 mg - 1 tablet in evening  3.  If needed:  A. Nasal saline B. OTC Cetirizine 10 mg - 1 tablet 1 time per day (sedation???) C. Albuterol HFA - 2 inhalations every 4-6 hours  4.  Return in 12 weeks or earlier if problem.    5. Plan for fall flu vaccine

## 2023-05-29 ENCOUNTER — Encounter: Payer: Self-pay | Admitting: Allergy and Immunology

## 2023-05-29 ENCOUNTER — Telehealth: Payer: Self-pay | Admitting: *Deleted

## 2023-05-29 DIAGNOSIS — R5383 Other fatigue: Secondary | ICD-10-CM

## 2023-05-29 DIAGNOSIS — J4530 Mild persistent asthma, uncomplicated: Secondary | ICD-10-CM

## 2023-05-29 NOTE — Telephone Encounter (Signed)
Left a voicemail for a return call

## 2023-05-29 NOTE — Telephone Encounter (Signed)
-----   Message from ERIC J KOZLOW sent at 05/29/2023  6:32 AM EDT ----- Please inform Madigan that I looked through some of the labs that she had checked in summer 2024 and she has dropped her hemoglobin over the course of the past year.  Has anyone told her that she is anemic?  And I cannot see a recent thyroid function test that has been performed.  I would recommend that she get a CBC with differential, comprehensive metabolic panel, TSH, free T4, thyroid peroxidase antibody, iron panel with ferritin.

## 2023-05-30 NOTE — Telephone Encounter (Signed)
Labs were ordered and patient informed.

## 2023-05-30 NOTE — Addendum Note (Signed)
Addended by: Deborra Medina on: 05/30/2023 09:09 AM   Modules accepted: Orders

## 2023-05-31 DIAGNOSIS — M4316 Spondylolisthesis, lumbar region: Secondary | ICD-10-CM | POA: Diagnosis not present

## 2023-06-05 ENCOUNTER — Telehealth: Payer: Self-pay | Admitting: Allergy and Immunology

## 2023-06-05 NOTE — Telephone Encounter (Signed)
Patient ask that I note on her chart to not send in refills unless requested by her.

## 2023-06-06 DIAGNOSIS — R5383 Other fatigue: Secondary | ICD-10-CM | POA: Diagnosis not present

## 2023-06-07 LAB — CBC WITH DIFF/PLATELET
Basophils Absolute: 0 10*3/uL (ref 0.0–0.2)
Basos: 1 %
EOS (ABSOLUTE): 0.2 10*3/uL (ref 0.0–0.4)
Eos: 3 %
Hematocrit: 42.3 % (ref 34.0–46.6)
Hemoglobin: 13.7 g/dL (ref 11.1–15.9)
Immature Grans (Abs): 0 10*3/uL (ref 0.0–0.1)
Immature Granulocytes: 0 %
Lymphocytes Absolute: 2.5 10*3/uL (ref 0.7–3.1)
Lymphs: 42 %
MCH: 28 pg (ref 26.6–33.0)
MCHC: 32.4 g/dL (ref 31.5–35.7)
MCV: 86 fL (ref 79–97)
Monocytes Absolute: 0.5 10*3/uL (ref 0.1–0.9)
Monocytes: 8 %
Neutrophils Absolute: 2.7 10*3/uL (ref 1.4–7.0)
Neutrophils: 46 %
Platelets: 301 10*3/uL (ref 150–450)
RBC: 4.9 x10E6/uL (ref 3.77–5.28)
RDW: 14.2 % (ref 11.7–15.4)
WBC: 5.8 10*3/uL (ref 3.4–10.8)

## 2023-06-07 LAB — COMPREHENSIVE METABOLIC PANEL
ALT: 21 [IU]/L (ref 0–32)
AST: 30 [IU]/L (ref 0–40)
Albumin: 4.3 g/dL (ref 3.8–4.8)
Alkaline Phosphatase: 121 [IU]/L (ref 44–121)
BUN/Creatinine Ratio: 18 (ref 12–28)
BUN: 12 mg/dL (ref 8–27)
Bilirubin Total: 0.4 mg/dL (ref 0.0–1.2)
CO2: 23 mmol/L (ref 20–29)
Calcium: 9.6 mg/dL (ref 8.7–10.3)
Chloride: 100 mmol/L (ref 96–106)
Creatinine, Ser: 0.68 mg/dL (ref 0.57–1.00)
Globulin, Total: 2.5 g/dL (ref 1.5–4.5)
Glucose: 90 mg/dL (ref 70–99)
Potassium: 4.2 mmol/L (ref 3.5–5.2)
Sodium: 138 mmol/L (ref 134–144)
Total Protein: 6.8 g/dL (ref 6.0–8.5)
eGFR: 92 mL/min/{1.73_m2} (ref >59)

## 2023-06-07 LAB — IRON,TIBC AND FERRITIN PANEL
Ferritin: 55 ng/mL (ref 15–150)
Iron Saturation: 17 % (ref 15–55)
Iron: 64 ug/dL (ref 27–139)
Total Iron Binding Capacity: 381 ug/dL (ref 250–450)
UIBC: 317 ug/dL (ref 118–369)

## 2023-06-07 LAB — THYROID PEROXIDASE ANTIBODY: Thyroperoxidase Ab SerPl-aCnc: 11 [IU]/mL (ref 0–34)

## 2023-06-07 LAB — TSH+FREE T4
Free T4: 1.12 ng/dL (ref 0.82–1.77)
TSH: 2.72 u[IU]/mL (ref 0.450–4.500)

## 2023-06-15 DIAGNOSIS — M79605 Pain in left leg: Secondary | ICD-10-CM | POA: Diagnosis not present

## 2023-06-15 DIAGNOSIS — R293 Abnormal posture: Secondary | ICD-10-CM | POA: Diagnosis not present

## 2023-06-15 DIAGNOSIS — M2560 Stiffness of unspecified joint, not elsewhere classified: Secondary | ICD-10-CM | POA: Diagnosis not present

## 2023-06-15 DIAGNOSIS — M79604 Pain in right leg: Secondary | ICD-10-CM | POA: Diagnosis not present

## 2023-06-15 DIAGNOSIS — M5459 Other low back pain: Secondary | ICD-10-CM | POA: Diagnosis not present

## 2023-06-15 DIAGNOSIS — R2681 Unsteadiness on feet: Secondary | ICD-10-CM | POA: Diagnosis not present

## 2023-06-15 DIAGNOSIS — M6281 Muscle weakness (generalized): Secondary | ICD-10-CM | POA: Diagnosis not present

## 2023-06-16 DIAGNOSIS — H25811 Combined forms of age-related cataract, right eye: Secondary | ICD-10-CM | POA: Diagnosis not present

## 2023-06-21 DIAGNOSIS — R2681 Unsteadiness on feet: Secondary | ICD-10-CM | POA: Diagnosis not present

## 2023-06-21 DIAGNOSIS — M79605 Pain in left leg: Secondary | ICD-10-CM | POA: Diagnosis not present

## 2023-06-21 DIAGNOSIS — M5459 Other low back pain: Secondary | ICD-10-CM | POA: Diagnosis not present

## 2023-06-21 DIAGNOSIS — M6281 Muscle weakness (generalized): Secondary | ICD-10-CM | POA: Diagnosis not present

## 2023-06-21 DIAGNOSIS — M79604 Pain in right leg: Secondary | ICD-10-CM | POA: Diagnosis not present

## 2023-06-21 DIAGNOSIS — M2560 Stiffness of unspecified joint, not elsewhere classified: Secondary | ICD-10-CM | POA: Diagnosis not present

## 2023-06-21 DIAGNOSIS — R293 Abnormal posture: Secondary | ICD-10-CM | POA: Diagnosis not present

## 2023-07-12 DIAGNOSIS — G2581 Restless legs syndrome: Secondary | ICD-10-CM | POA: Diagnosis not present

## 2023-07-12 DIAGNOSIS — E7800 Pure hypercholesterolemia, unspecified: Secondary | ICD-10-CM | POA: Diagnosis not present

## 2023-07-12 DIAGNOSIS — E039 Hypothyroidism, unspecified: Secondary | ICD-10-CM | POA: Diagnosis not present

## 2023-07-12 DIAGNOSIS — Z23 Encounter for immunization: Secondary | ICD-10-CM | POA: Diagnosis not present

## 2023-07-12 LAB — LAB REPORT - SCANNED
EGFR: 81
TSH: 3.64 (ref 0.41–5.90)

## 2023-07-14 DIAGNOSIS — R1084 Generalized abdominal pain: Secondary | ICD-10-CM | POA: Diagnosis not present

## 2023-07-14 DIAGNOSIS — R110 Nausea: Secondary | ICD-10-CM | POA: Diagnosis not present

## 2023-07-14 DIAGNOSIS — K5900 Constipation, unspecified: Secondary | ICD-10-CM | POA: Diagnosis not present

## 2023-07-17 ENCOUNTER — Other Ambulatory Visit: Payer: Self-pay

## 2023-07-17 DIAGNOSIS — R1032 Left lower quadrant pain: Secondary | ICD-10-CM | POA: Diagnosis not present

## 2023-07-17 DIAGNOSIS — K5900 Constipation, unspecified: Secondary | ICD-10-CM | POA: Diagnosis not present

## 2023-07-17 DIAGNOSIS — R109 Unspecified abdominal pain: Secondary | ICD-10-CM | POA: Diagnosis not present

## 2023-07-17 DIAGNOSIS — R011 Cardiac murmur, unspecified: Secondary | ICD-10-CM | POA: Insufficient documentation

## 2023-07-18 ENCOUNTER — Ambulatory Visit: Payer: Medicare Other | Attending: Cardiology | Admitting: Cardiology

## 2023-07-18 ENCOUNTER — Telehealth (HOSPITAL_BASED_OUTPATIENT_CLINIC_OR_DEPARTMENT_OTHER): Payer: Self-pay

## 2023-07-18 ENCOUNTER — Encounter: Payer: Self-pay | Admitting: Cardiology

## 2023-07-18 VITALS — BP 130/70 | HR 73 | Ht 60.6 in | Wt 161.2 lb

## 2023-07-18 DIAGNOSIS — R1032 Left lower quadrant pain: Secondary | ICD-10-CM | POA: Diagnosis not present

## 2023-07-18 DIAGNOSIS — K5900 Constipation, unspecified: Secondary | ICD-10-CM | POA: Diagnosis not present

## 2023-07-18 DIAGNOSIS — R109 Unspecified abdominal pain: Secondary | ICD-10-CM | POA: Diagnosis not present

## 2023-07-18 DIAGNOSIS — E782 Mixed hyperlipidemia: Secondary | ICD-10-CM | POA: Diagnosis not present

## 2023-07-18 NOTE — Patient Instructions (Signed)
Medication Instructions:  Your physician recommends that you continue on your current medications as directed. Please refer to the Current Medication list given to you today.  *If you need a refill on your cardiac medications before your next appointment, please call your pharmacy*   Lab Work: None ordered If you have labs (blood work) drawn today and your tests are completely normal, you will receive your results only by: MyChart Message (if you have MyChart) OR A paper copy in the mail If you have any lab test that is abnormal or we need to change your treatment, we will call you to review the results.   Testing/Procedures: We will order CT coronary calcium score. It will cost $99.00 and is due at time of scan.  Please call to schedule.    [facility] [address] [place], [place] [address] Located on the first floor, [address] [phone]  Follow-Up: At [facility], you and your health needs are our priority.  As part of our continuing mission to provide you with exceptional heart care, we have created designated Provider Care Teams.  These Care Teams include your primary Cardiologist (physician) and Advanced Practice Providers (APPs -  Physician Assistants and Nurse Practitioners) who all work together to provide you with the care you need, when you need it.  We recommend signing up for the patient portal called "MyChart".  Sign up information is provided on this After Visit Summary.  MyChart is used to connect with patients for Virtual Visits (Telemedicine).  Patients are able to view lab/test results, encounter notes, upcoming appointments, etc.  Non-urgent messages can be sent to your provider as well.   To learn more about what you can do with MyChart, go to [email or web address].    Your next appointment:   12 month(s)  The format for your next appointment:   In Person  Provider:   Belva Crome, MD    Other Instructions Coronary Calcium  Scan A coronary calcium scan is an imaging test used to look for deposits of plaque in the inner lining of the blood vessels of the heart (coronary arteries). Plaque is made up of calcium, protein, and fatty substances. These deposits of plaque can partly clog and narrow the coronary arteries without producing any symptoms or warning signs. This puts a person at risk for a heart attack. A coronary calcium scan is performed using a computed tomography (CT) scanner machine without using a dye (contrast). This test is recommended for people who are at moderate risk for heart disease. The test can find plaque deposits before symptoms develop. Tell a health care provider about: Any allergies you have. All medicines you are taking, including vitamins, herbs, eye drops, creams, and over-the-counter medicines. Any problems you or family members have had with anesthetic medicines. Any bleeding problems you have. Any surgeries you have had. Any medical conditions you have. Whether you are pregnant or may be pregnant. What are the risks? Generally, this is a safe procedure. However, problems may occur, including: Harm to a pregnant woman and her unborn baby. This test involves the use of radiation. Radiation exposure can be dangerous to a pregnant woman and her unborn baby. If you are pregnant or think you may be pregnant, you should not have this procedure done. A slight increase in the risk of cancer. This is because of the radiation involved in the test. The amount of radiation from one test is similar to the amount of radiation you are  naturally exposed to over one year. What happens before the procedure? Ask your health care provider for any specific instructions on how to prepare for this procedure. You may be asked to avoid products that contain caffeine, tobacco, or nicotine for 4 hours before the procedure. What happens during the procedure?  You will undress and remove any jewelry from your neck  or chest. You may need to remove hearing aides and dentures. Women may need to remove their bras. You will put on a hospital gown. Sticky electrodes will be placed on your chest. The electrodes will be connected to an electrocardiogram (ECG) machine to record a tracing of the electrical activity of your heart. You will lie down on your back on a curved bed that is attached to the CT scanner. You may be given medicine to slow down your heart rate so that clear pictures can be created. You will be moved into the CT scanner, and the CT scanner will take pictures of your heart. During this time, you will be asked to lie still and hold your breath for 10-20 seconds at a time while each picture of your heart is being taken. The procedure may vary among health care providers and hospitals. What can I expect after the procedure? You can return to your normal activities. It is up to you to get the results of your procedure. Ask your health care provider, or the department that is doing the procedure, when your results will be ready. Summary A coronary calcium scan is an imaging test used to look for deposits of plaque in the inner lining of the blood vessels of the heart. Plaque is made up of calcium, protein, and fatty substances. A coronary calcium scan is performed using a CT scanner machine without contrast. Generally, this is a safe procedure. Tell your health care provider if you are pregnant or may be pregnant. Ask your health care provider for any specific instructions on how to prepare for this procedure. You can return to your normal activities after the scan is done. This information is not intended to replace advice given to you by your health care provider. Make sure you discuss any questions you have with your health care provider. Document Revised: 07/18/2021 Document Reviewed: 07/18/2021 Elsevier Patient Education  2024 Elsevier Inc.   Important Information About Sugar

## 2023-07-18 NOTE — Progress Notes (Signed)
Cardiology Office Note:    Date:  07/18/2023   ID:  Olivia Cruz, DOB November 24, 1950, MRN [id number]  PCP:  Deatra James, MD  Cardiologist:  Garwin Brothers, MD   Referring MD: Deatra James, MD    ASSESSMENT:    1. Mixed hyperlipidemia    PLAN:    In order of problems listed above:  Primary prevention stressed with the patient.  Importance of compliance with diet medication stressed and patient verbalized standing. Mixed dyslipidemia: Not on lipid-lowering medications.  She has been initiated by primary care.  I discussed this with her at length.  Her lipids are markedly elevated and I reviewed them with her.  Diet emphasized.  She will follow-up with lipids with primary care. Coronary risk stratification: We will do a CT calcium scan and she is agreeable. Obesity: Weight reduction stressed diet emphasized and she promises to do better. Patient will be seen in follow-up appointment in 6 months or earlier if the patient has any concerns.    Medication Adjustments/Labs and Tests Ordered: Current medicines are reviewed at length with the patient today.  Concerns regarding medicines are outlined above.  Orders Placed This Encounter  Procedures   EKG 12-Lead   No orders of the defined types were placed in this encounter.    No chief complaint on file.    History of Present Illness:    Olivia Cruz is a 72 y.o. female.  Patient has past medical history of mixed dyslipidemia.  She denies any problems at this time and takes care of activities of daily living.  Overall she leads a sedentary lifestyle.  No chest pain orthopnea or PND.  At the time of my evaluation, the patient is alert awake oriented and in no distress.  Past Medical History:  Diagnosis Date   Achilles tendonosis 10/06/2020   Acute cystitis with hematuria 09/29/2019   Acute infection of nasal sinus 06/21/2021   Allergic rhinoconjunctivitis    Anxiety    Asthma    Cardiac murmur 05/04/2022   Cervical  os stenosis 04/09/2015   Chest pain    Chest pain of uncertain etiology 05/04/2022   Chronic back pain 09/23/2021   Chronic knee pain after total replacement of right knee joint 07/21/2020   Chronic right shoulder pain 05/25/2021   Depression    Depression, major, recurrent, mild (HCC)    Diverticulitis    Dysuria 07/12/2021   GERD (gastroesophageal reflux disease)    Glaucoma    Heart murmur    Hypercholesterolemia    Hypothyroidism    Ingrown nail 02/04/2021   Insomnia    Laryngopharyngeal reflux (LPR)    Memory changes    Migraine without aura and without status migrainosus, not intractable 05/07/2021   Migraines    Mild recurrent major depression (HCC) 07/12/2021   Mixed hyperlipidemia 12/22/2019   Muscle tension dysphonia 09/12/2018   Muscle wasting and atrophy, not elsewhere classified, left hand 12/01/2020   Nodule of finger of right hand 12/01/2020   Osteoarthritis    Osteopenia    Pain and swelling of right knee 12/25/2018   Postmenopausal atrophic vaginitis 04/09/2015   Prediabetes 12/22/2019   Psoriatic arthritis (HCC)    RLS (restless legs syndrome)    Seasonal allergic rhinitis due to pollen 06/21/2021   Secondary hypothyroidism 12/22/2019   Skin cancer    Snoring 05/07/2021   Spondylolisthesis at L4-L5 level 04/27/2022   Status post total right knee replacement 12/29/2021   STD (sexually transmitted disease)  HSV II   Tendinopathy of right rotator cuff 05/25/2021   Tightness of heel cord, left 11/10/2020   Unilateral primary osteoarthritis, left knee 09/22/2022   Vocal fold atrophy 09/12/2018    Past Surgical History:  Procedure Laterality Date   BACK SURGERY  2024   fusion of L4 and L5   BELPHAROPTOSIS REPAIR     BREAST SURGERY Left    times 2   CARDIAC CATHETERIZATION  10/05/2015   COLONOSCOPY  06/03/2013   Moderate predominantly sigmoid diverticulosis. Small interal hemorroids   FOOT SURGERY Right    Removed bone spur   GANGLION CYST  EXCISION Left    foot   HEMORRHOID SURGERY     SKIN CANCER EXCISION     TOTAL KNEE ARTHROPLASTY Right 07/21/2020   UPPER GI ENDOSCOPY  03/23/2016   Mild gastritis, gastric polyps bxbenign squamous mucosa with no abnormaility, fundic glad poly in setting of mild chron gastritis.    Current Medications: Current Meds  Medication Sig   Adalimumab (HUMIRA PEN) 40 MG/0.4ML PNKT Inject 40 mg into the skin once a week. Unsure if dose is 40 mg- every other week   ascorbic acid (VITAMIN C) 500 MG tablet Take 500 mg by mouth daily.   atorvastatin (LIPITOR) 10 MG tablet Take 10 mg by mouth daily.   beclomethasone (QVAR REDIHALER) 40 MCG/ACT inhaler Inhale 2 puffs 1-2 times daily to prevent cough or wheeze.  Rinse, gargle, and spit after use.   Biotin w/ Vitamins C & E (HAIR/SKIN/NAILS PO) Take 1 tablet by mouth daily.   budesonide (RHINOCORT AQUA) 32 MCG/ACT nasal spray Place 1 spray into both nostrils daily. Reported on 02/04/2016   CALCIUM MAGNESIUM ZINC PO Take by mouth.   Calcium-Vitamin D-Vitamin K [phone] MG-UNT-MCG TABS Take 1 tablet by mouth daily.   cetirizine (ZYRTEC) 10 MG tablet Take 1 tablet (10 mg total) by mouth daily as needed for allergies.   clindamycin (CLEOCIN) 300 MG capsule Take 300 mg by mouth as needed. AS NEEDED FOR DENTAL PROCEDURE   diclofenac Sodium (VOLTAREN) 1 % GEL Apply 1 Application topically as needed for pain (Knee pain).   famotidine (PEPCID) 40 MG tablet TAKE 1 TABLET BY MOUTH DAILY AT  BEDTIME   gabapentin (NEURONTIN) 300 MG capsule Take 300 mg by mouth in the morning, at noon, and at bedtime.   levothyroxine (SYNTHROID) 50 MCG tablet TAKE 1 TABLET BY MOUTH DAILY   methocarbamol (ROBAXIN) 500 MG tablet Take 1 tablet (500 mg total) by mouth every 6 (six) hours as needed for muscle spasms.   Multiple Vitamin (MULTIVITAMIN WITH MINERALS) TABS tablet Take 1 tablet by mouth daily. Woman 50+   naproxen sodium (ALEVE) 220 MG tablet Take 220 mg by mouth daily as  needed (headache).   nitrofurantoin (MACRODANTIN) 100 MG capsule Take 100 mg by mouth as needed (after  intercourse).   nitroGLYCERIN (NITROSTAT) 0.4 MG SL tablet Place 1 tablet (0.4 mg total) under the tongue every 5 (five) minutes as needed for chest pain.   ondansetron (ZOFRAN) 4 MG tablet Take 1 tablet (4 mg total) by mouth every 6 (six) hours as needed for nausea or vomiting.   pantoprazole (PROTONIX) 40 MG tablet Take 1 tablet 1-2 times per day   Propylene Glycol, PF, (SYSTANE COMPLETE PF) 0.6 % SOLN Place 1 drop into both eyes daily as needed (dry eyes).   valACYclovir (VALTREX) 500 MG tablet Take 1 tablet (500 mg total) by mouth 3 (three) times daily as needed (herpes  outbreak).     Allergies:   Codeine, Ambien [zolpidem tartrate], Clonazepam, Cyproheptadine hcl, Dilaudid [hydromorphone], Hydrocodone, Lisinopril, Pramipexole, Sulfa antibiotics, and Tramadol   Social History   Socioeconomic History   Marital status: Married    Spouse name: Not on file   Number of children: Not on file   Years of education: Not on file   Highest education level: Not on file  Occupational History   Not on file  Tobacco Use   Smoking status: Former    Current packs/day: 0.00    Types: Cigarettes    Quit date: 1986    Years since quitting: 38.9   Smokeless tobacco: Never  Vaping Use   Vaping status: Never Used  Substance and Sexual Activity   Alcohol use: No    Alcohol/week: 0.0 standard drinks of alcohol   Drug use: No   Sexual activity: Yes    Partners: Male    Birth control/protection: Post-menopausal    Comment: husband vasectomy  Other Topics Concern   Not on file  Social History Narrative   Right handed   Drinks caffeine prn   Social Determinants of Health   Financial Resource Strain: Low Risk  (10/07/2021)   Overall Financial Resource Strain (CARDIA)    Difficulty of Paying Living Expenses: Not very hard  Food Insecurity: No Food Insecurity (12/02/2020)   Hunger Vital Sign     Worried About Running Out of Food in the Last Year: Never true    Ran Out of Food in the Last Year: Never true  Transportation Needs: No Transportation Needs (10/07/2021)   PRAPARE - Administrator, Civil Service (Medical): No    Lack of Transportation (Non-Medical): No  Physical Activity: Sufficiently Active (12/02/2020)   Exercise Vital Sign    Days of Exercise per Week: 5 days    Minutes of Exercise per Session: 30 min  Stress: No Stress Concern Present (12/02/2020)   [employer] of Occupational Health - Occupational Stress Questionnaire    Feeling of Stress : Only a little  Social Connections: Socially Integrated (12/02/2020)   Social Connection and Isolation Panel [NHANES]    Frequency of Communication with Friends and Family: More than three times a week    Frequency of Social Gatherings with Friends and Family: More than three times a week    Attends Religious Services: More than 4 times per year    Active Member of [employer] or Organizations: Yes    Attends Engineer, structural: More than 4 times per year    Marital Status: Married     Family History: The patient's family history includes Breast cancer in her sister; Cancer in her maternal grandmother and paternal grandmother; Depression in her mother; Diverticulitis in her father; Glaucoma in her father; Heart disease in her mother; Heart failure in her mother; Hyperlipidemia in her mother; Hypertension in her mother; Multiple births in her maternal grandmother and paternal grandmother; Parkinson's disease in her father; Stroke in her mother; Thyroid disease in her mother.  ROS:   Please see the history of present illness.    All other systems reviewed and are negative.  EKGs/Labs/Other Studies Reviewed:    The following studies were reviewed today: .Marland KitchenEKG Interpretation Date/Time:  Tuesday July 18 2023 10:55:45 EST Ventricular Rate:  73 PR Interval:  152 QRS Duration:  78 QT Interval:  410 QTC  Calculation: 451 R Axis:   77  Text Interpretation: Normal sinus rhythm with sinus arrhythmia Normal ECG No previous  ECGs available Confirmed by Belva Crome [phone]) on 07/18/2023 10:09:02 AM     Recent Labs: 06/06/2023: ALT 21; BUN 12; Creatinine, Ser 0.68; Hemoglobin 13.7; Platelets 301; Potassium 4.2; Sodium 138; TSH 2.720  Recent Lipid Panel    Component Value Date/Time   CHOL 114 07/12/2021 0803   TRIG 93 07/12/2021 0803   HDL 52 07/12/2021 0803   CHOLHDL 2.2 07/12/2021 0803   CHOLHDL 2.1 02/18/2013 1029   VLDL 16 02/18/2013 1029   LDLCALC 44 07/12/2021 0803    Physical Exam:    VS:  BP 130/70   Pulse 73   Ht 5' 0.6" (1.539 m)   Wt 161 lb 3.2 oz (73.1 kg)   LMP 08/22/2005   SpO2 96%   BMI 30.86 kg/m     Wt Readings from Last 3 Encounters:  07/18/23 161 lb 3.2 oz (73.1 kg)  04/20/23 158 lb 3.2 oz (71.8 kg)  01/30/23 155 lb (70.3 kg)     GEN: Patient is in no acute distress HEENT: Normal NECK: No JVD; No carotid bruits LYMPHATICS: No lymphadenopathy CARDIAC: Hear sounds regular, 2/6 systolic murmur at the apex. RESPIRATORY:  Clear to auscultation without rales, wheezing or rhonchi  ABDOMEN: Soft, non-tender, non-distended MUSCULOSKELETAL:  No edema; No deformity  SKIN: Warm and dry NEUROLOGIC:  Alert and oriented x 3 PSYCHIATRIC:  Normal affect   Signed, Garwin Brothers, MD  07/18/2023 10:11 AM    [facility]

## 2023-07-27 DIAGNOSIS — M47899 Other spondylosis, site unspecified: Secondary | ICD-10-CM | POA: Diagnosis not present

## 2023-07-27 DIAGNOSIS — L4059 Other psoriatic arthropathy: Secondary | ICD-10-CM | POA: Diagnosis not present

## 2023-07-27 DIAGNOSIS — R053 Chronic cough: Secondary | ICD-10-CM | POA: Diagnosis not present

## 2023-07-27 DIAGNOSIS — Z79899 Other long term (current) drug therapy: Secondary | ICD-10-CM | POA: Diagnosis not present

## 2023-07-27 DIAGNOSIS — R0989 Other specified symptoms and signs involving the circulatory and respiratory systems: Secondary | ICD-10-CM | POA: Diagnosis not present

## 2023-07-28 DIAGNOSIS — K219 Gastro-esophageal reflux disease without esophagitis: Secondary | ICD-10-CM | POA: Diagnosis not present

## 2023-07-28 DIAGNOSIS — K5900 Constipation, unspecified: Secondary | ICD-10-CM | POA: Diagnosis not present

## 2023-07-28 DIAGNOSIS — E7800 Pure hypercholesterolemia, unspecified: Secondary | ICD-10-CM | POA: Diagnosis not present

## 2023-07-31 ENCOUNTER — Telehealth: Payer: Self-pay | Admitting: Cardiology

## 2023-07-31 NOTE — Telephone Encounter (Signed)
Pt c/o medication issue:  1. Name of Medication:   atorvastatin (LIPITOR) 10 MG tablet    2. How are you currently taking this medication (dosage and times per day)? No longer taking   3. Are you having a reaction (difficulty breathing--STAT)? Yes   4. What is your medication issue? Patient states she was very angry and upset on this medication. States her PCP took her off it on Friday and advised she call the office to discuss alternatives. Please advise.

## 2023-08-01 NOTE — Telephone Encounter (Signed)
Records requested from PCP.

## 2023-08-06 ENCOUNTER — Other Ambulatory Visit: Payer: Self-pay | Admitting: Allergy and Immunology

## 2023-08-07 ENCOUNTER — Ambulatory Visit: Payer: Medicare Other | Admitting: Allergy and Immunology

## 2023-08-09 NOTE — Telephone Encounter (Signed)
The medication made the pt feel angry. Please advise

## 2023-08-09 NOTE — Telephone Encounter (Signed)
Per DPR detailed message left on VM with recommendations as reviewed by Dr. Revankar.  

## 2023-08-31 ENCOUNTER — Ambulatory Visit (INDEPENDENT_AMBULATORY_CARE_PROVIDER_SITE_OTHER): Payer: HMO | Admitting: Allergy and Immunology

## 2023-08-31 ENCOUNTER — Encounter: Payer: Self-pay | Admitting: Allergy and Immunology

## 2023-08-31 VITALS — BP 126/82 | HR 82 | Resp 16 | Ht 60.2 in | Wt 162.4 lb

## 2023-08-31 DIAGNOSIS — J4530 Mild persistent asthma, uncomplicated: Secondary | ICD-10-CM | POA: Diagnosis not present

## 2023-08-31 DIAGNOSIS — D849 Immunodeficiency, unspecified: Secondary | ICD-10-CM | POA: Diagnosis not present

## 2023-08-31 DIAGNOSIS — J3089 Other allergic rhinitis: Secondary | ICD-10-CM

## 2023-08-31 DIAGNOSIS — K219 Gastro-esophageal reflux disease without esophagitis: Secondary | ICD-10-CM | POA: Diagnosis not present

## 2023-08-31 NOTE — Patient Instructions (Addendum)
  1.  Treat and prevent inflammation:  A. Qvar  40 - 2 inhalations 1-2 times per day  B. Budesonide  - 2 sprays each nostril 3-7 times per week  2.  Treat and prevent reflux/LPR:  A. Pantoprazole  40 mg - 1 tablet 1-2 time per day B. Famotidine  40 mg - 1 tablet in evening  3.  If needed:  A. Nasal saline B. OTC Cetirizine  10 mg - 1 tablet 1 time per day (sedation???) C. Albuterol  HFA - 2 inhalations every 4-6 hours  4.  Return in 6 months or earlier if problem.    5. Influenza = Tamiflu. Covid = Paxlovid

## 2023-08-31 NOTE — Progress Notes (Signed)
 [facility] [place] [facility] [place] [facility] [place] [facility] [place]   Follow-up Note  Referring Provider: Sun, Vyvyan, MD Primary Provider: Sun, Vyvyan, MD Date of Office Visit: 08/31/2023  Subjective:   Olivia Cruz (DOB: Apr 11, 1951) is a 73 y.o. female who [facility] in re-evaluation of the following:  HPI: [name] returns to this clinic in evaluation of asthma, allergic rhinitis, LPR, and history of immunosuppression with Humira.  I last saw her in this clinic 25 May 2023.  She has really done very well with her airway and has not required a systemic steroid or antibiotic while she uses Qvar  at a dose of 80 mcg once a day and nasal budesonide  a few times per week.  She rarely has any wheezing or coughing or nasal congestion or sneezing and she rarely uses a SABA  She believes her reflux is under good control. She has been able to taper her pantoprazole  to 1 time per day  She has obtained the flu vaccine.   Allergies as of 08/31/2023       Reactions   Codeine Anaphylaxis, Nausea Only   Ambien [zolpidem Tartrate] Other (See Comments)   Memory loss per patient   Clonazepam     Other Reaction(s): Fatique, Mental Status Changes   Cyproheptadine  Hcl Other (See Comments)   Confusion   Dilaudid  [hydromorphone ]    hallucinations   Hydrocodone Other (See Comments)   anaphylaxis   Lisinopril Cough   Pramipexole  Nausea Only   Sulfa Antibiotics Other (See Comments)   hives   Tramadol Nausea And Vomiting        Medication List    Aleve 220 MG tablet Generic drug: naproxen sodium Take 220 mg by mouth daily as needed (headache).   ascorbic acid 500 MG tablet Commonly known as: VITAMIN C Take 500 mg by mouth daily.   atorvastatin 10 MG tablet Commonly known as: LIPITOR Take 10 mg by mouth daily.   budesonide  32 MCG/ACT nasal spray Commonly known as: RHINOCORT  AQUA Place 1 spray into both nostrils daily. Reported on 02/04/2016    CALCIUM  MAGNESIUM  ZINC PO Take by mouth.   Calcium -Vitamin D -Vitamin K [id number] MG-UNT-MCG Tabs Take 1 tablet by mouth daily.   cetirizine  10 MG tablet Commonly known as: ZYRTEC  Take 1 tablet (10 mg total) by mouth daily as needed for allergies.   clindamycin 300 MG capsule Commonly known as: CLEOCIN Take 300 mg by mouth as needed. AS NEEDED FOR DENTAL PROCEDURE   diclofenac  Sodium 1 % Gel Commonly known as: VOLTAREN  Apply 1 Application topically as needed for pain (Knee pain).   famotidine  40 MG tablet Commonly known as: PEPCID  TAKE 1 TABLET BY MOUTH DAILY AT  BEDTIME   gabapentin  300 MG capsule Commonly known as: NEURONTIN  Take 300 mg by mouth in the morning, at noon, and at bedtime.   HAIR/SKIN/NAILS PO Take 1 tablet by mouth daily.   Humira (2 Pen) 40 MG/0.4ML pen Generic drug: adalimumab Inject 40 mg into the skin once a week. Unsure if dose is 40 mg- every other week   levothyroxine  50 MCG tablet Commonly known as: SYNTHROID  TAKE 1 TABLET BY MOUTH DAILY   methocarbamol  500 MG tablet Commonly known as: ROBAXIN  Take 1 tablet (500 mg total) by mouth every 6 (six) hours as needed for muscle spasms.   multivitamin with minerals Tabs tablet Take 1 tablet by mouth daily. Woman 50+   nitrofurantoin  100 MG capsule Commonly known as: MACRODANTIN   Take 100 mg by mouth as needed (after  intercourse).   nitroGLYCERIN  0.4 MG SL tablet Commonly known as: NITROSTAT  Place 1 tablet (0.4 mg total) under the tongue every 5 (five) minutes as needed for chest pain.   ondansetron  4 MG tablet Commonly known as: ZOFRAN  Take 1 tablet (4 mg total) by mouth every 6 (six) hours as needed for nausea or vomiting.   pantoprazole  40 MG tablet Commonly known as: PROTONIX  Take 1 tablet 1-2 times per day   Qvar  RediHaler 40 MCG/ACT inhaler Generic drug: beclomethasone INHALE 2 INHALATIONS BY MOUTH 1  TO 2 TIMES DAILY TO PREVENT  COUGH OR WHEEZE. RINSE, GARGLE,  AND SPIT AFTER  USE   Systane Complete PF 0.6 % Soln Generic drug: Propylene Glycol (PF) Place 1 drop into both eyes daily as needed (dry eyes).   valACYclovir  500 MG tablet Commonly known as: VALTREX  Take 1 tablet (500 mg total) by mouth 3 (three) times daily as needed (herpes outbreak).    Past Medical History:  Diagnosis Date   Achilles tendonosis 10/06/2020   Acute cystitis with hematuria 09/29/2019   Acute infection of nasal sinus 06/21/2021   Allergic rhinoconjunctivitis    Anxiety    Asthma    Cardiac murmur 05/04/2022   Cervical os stenosis 04/09/2015   Chest pain    Chest pain of uncertain etiology 05/04/2022   Chronic back pain 09/23/2021   Chronic knee pain after total replacement of right knee joint 07/21/2020   Chronic right shoulder pain 05/25/2021   Depression    Depression, major, recurrent, mild (HCC)    Diverticulitis    Dysuria 07/12/2021   GERD (gastroesophageal reflux disease)    Glaucoma    Heart murmur    Hypercholesterolemia    Hypothyroidism    Ingrown nail 02/04/2021   Insomnia    Laryngopharyngeal reflux (LPR)    Memory changes    Migraine without aura and without status migrainosus, not intractable 05/07/2021   Migraines    Mild recurrent major depression (HCC) 07/12/2021   Mixed hyperlipidemia 12/22/2019   Muscle tension dysphonia 09/12/2018   Muscle wasting and atrophy, not elsewhere classified, left hand 12/01/2020   Nodule of finger of right hand 12/01/2020   Osteoarthritis    Osteopenia    Pain and swelling of right knee 12/25/2018   Postmenopausal atrophic vaginitis 04/09/2015   Prediabetes 12/22/2019   Psoriatic arthritis (HCC)    RLS (restless legs syndrome)    Seasonal allergic rhinitis due to pollen 06/21/2021   Secondary hypothyroidism 12/22/2019   Skin cancer    Snoring 05/07/2021   Spondylolisthesis at L4-L5 level 04/27/2022   Status post total right knee replacement 12/29/2021   STD (sexually transmitted disease)    HSV II    Tendinopathy of right rotator cuff 05/25/2021   Tightness of heel cord, left 11/10/2020   Unilateral primary osteoarthritis, left knee 09/22/2022   Vocal fold atrophy 09/12/2018    Past Surgical History:  Procedure Laterality Date   BACK SURGERY  2024   fusion of L4 and L5   BELPHAROPTOSIS REPAIR     BREAST SURGERY Left    times 2   CARDIAC CATHETERIZATION  10/05/2015   COLONOSCOPY  06/03/2013   Moderate predominantly sigmoid diverticulosis. Small interal hemorroids   FOOT SURGERY Right    Removed bone spur   GANGLION CYST EXCISION Left    foot   HEMORRHOID SURGERY     SKIN CANCER EXCISION     TOTAL KNEE ARTHROPLASTY  Right 07/21/2020   UPPER GI ENDOSCOPY  03/23/2016   Mild gastritis, gastric polyps bxbenign squamous mucosa with no abnormaility, fundic glad poly in setting of mild chron gastritis.    Review of systems negative except as noted in HPI / PMHx or noted below:  Review of Systems  Constitutional: Negative.   HENT: Negative.    Eyes: Negative.   Respiratory: Negative.    Cardiovascular: Negative.   Gastrointestinal: Negative.   Genitourinary: Negative.   Musculoskeletal: Negative.   Skin: Negative.   Neurological: Negative.   Endo/Heme/Allergies: Negative.   Psychiatric/Behavioral: Negative.       Objective:   Vitals:   08/31/23 1457  BP: 126/82  Pulse: 82  Resp: 16  SpO2: 96%   Height: 5' 0.2 (152.9 cm)  Weight: 162 lb 6.4 oz (73.7 kg)   Physical Exam Constitutional:      Appearance: She is not diaphoretic.  HENT:     Head: Normocephalic.     Right Ear: Tympanic membrane, ear canal and external ear normal.     Left Ear: Tympanic membrane, ear canal and external ear normal.     Nose: Nose normal. No mucosal edema or rhinorrhea.     Mouth/Throat:     Pharynx: Uvula midline. No oropharyngeal exudate.  Eyes:     Conjunctiva/sclera: Conjunctivae normal.  Neck:     Thyroid : No thyromegaly.     Trachea: Trachea normal. No tracheal tenderness  or tracheal deviation.  Cardiovascular:     Rate and Rhythm: Normal rate and regular rhythm.     Heart sounds: Normal heart sounds, S1 normal and S2 normal. No murmur heard. Pulmonary:     Effort: No respiratory distress.     Breath sounds: Normal breath sounds. No stridor. No wheezing or rales.  Lymphadenopathy:     Head:     Right side of head: No tonsillar adenopathy.     Left side of head: No tonsillar adenopathy.     Cervical: No cervical adenopathy.  Skin:    Findings: No erythema or rash.     Nails: There is no clubbing.  Neurological:     Mental Status: She is alert.     Diagnostics: Spirometry was performed and demonstrated an FEV1 of 1.88 at 101 % of predicted.  Assessment and Plan:   1. Asthma, well controlled, mild persistent   2. Perennial allergic rhinitis   3. LPRD (laryngopharyngeal reflux disease)   4. Immunosuppression (HCC)    1.  Treat and prevent inflammation:  A. Qvar  40 - 2 inhalations 1-2 times per day  B. Budesonide  - 2 sprays each nostril 3-7 times per week  2.  Treat and prevent reflux/LPR:  A. Pantoprazole  40 mg - 1 tablet 1-2 time per day B. Famotidine  40 mg - 1 tablet in evening  3.  If needed:  A. Nasal saline B. OTC Cetirizine  10 mg - 1 tablet 1 time per day (sedation???) C. Albuterol  HFA - 2 inhalations every 4-6 hours  4.  Return in 6 months or earlier if problem.    5. Influenza = Tamiflu. Covid = Paxlovid  Adaja appears to be doing quite well on her current plan.  She has a very good understanding of her disease state and how her medications work and appropriate dosing of her medications depending on disease activity and she will continue on anti-inflammatory agents for airway and therapy directed against reflux as noted above and I will see her back in this clinic in 6  months or earlier if there is a problem.  Camellia Denis, MD Allergy / Immunology [facility]

## 2023-09-04 ENCOUNTER — Encounter: Payer: Self-pay | Admitting: Allergy and Immunology

## 2023-09-07 DIAGNOSIS — M175 Other unilateral secondary osteoarthritis of knee: Secondary | ICD-10-CM | POA: Diagnosis not present

## 2023-09-07 DIAGNOSIS — M1712 Unilateral primary osteoarthritis, left knee: Secondary | ICD-10-CM | POA: Diagnosis not present

## 2023-09-08 ENCOUNTER — Telehealth: Payer: Self-pay | Admitting: Cardiology

## 2023-09-08 NOTE — Telephone Encounter (Signed)
Patient identification verified by 2 forms. Marilynn Rail, RN    Called and spoke to patient  Patient states:   -at [facility] EKG completed yesterday   -EKG possibly showed some changes   -she was not having chest pain yesterday -in the past she had chest pain, arm pain and SOB  -last episode of chest pain was 1.5 week ago  -during episode she had a lot of pressure, SOB "felt like she was going to have a heart attack" -chest pain resolved after taking NTG   Patient denies at time of call:  -chest pain   -SOB  Advised patient:   -keep 1/20 OV for follow up   -if chest pain returns over weekend, present to ED, do not wait for OV  Patient verbalized understanding, no questions at this time

## 2023-09-08 NOTE — Telephone Encounter (Signed)
Calling to make provider aware that yesterday while pt was in their office pt was complaining of CP as well as arm pain and SOB. Office requesting that pt has Stress test done before procedure on 1/28. Please advise

## 2023-09-10 NOTE — Progress Notes (Addendum)
Cardiology Office Note:  .   Date:  09/11/2023  ID:  Olivia Cruz, DOB 01-21-1951, MRN [id number] PCP: Deatra James, MD  [facility] Providers Cardiologist:  Garwin Brothers, MD    History of Present Illness: Marland Kitchen   Olivia Cruz is a 73 y.o. female with a past medical history of migraines, GERD, hypothyroidism, psoriatic arthritis, dyslipidemia.  05/17/2022 echo EF 55 to 60%, mild concentric LVH, grade 1 DD trivial MR 05/12/2022 Myoview normal, low risk 2017 left heart cath normal coronary arteries  Most recently evaluated by Dr. Tomie China on 07/18/2023, her cholesterol a bit elevated however she is not on any lipid-lowering medications the decision was made to proceed with a calcium score and she was advised she can follow-up in 6 months.  Since she is last been evaluated in our office, she has been in the process of workup for TKR.  During that workup she had an EKG that revealed a new right bundle branch block and she was advised to follow back up with cardiology.  She presents today for follow-up after abnormal EKG.  Initially she states she has been feeling well however when we did a little bit deeper she has had several episodes of chest pain which has mixed features.  Some sound to be consistent with angina, others, possibly GERD.  On occasion she has taken her nitroglycerin with relief of symptoms.  She is also been able to be relatively active, stating she recently [place] without episodes of angina.  She denies palpitations, dyspnea, pnd, orthopnea, n, v, dizziness, syncope, edema, weight gain, or early satiety.    ROS: Review of Systems  Cardiovascular:  Positive for chest pain.  Musculoskeletal:  Positive for joint pain.  All other systems reviewed and are negative.    Studies Reviewed: Marland Kitchen   EKG Interpretation Date/Time:  Monday September 11 2023 13:30:53 EST Ventricular Rate:  81 PR Interval:  156 QRS Duration:  134 QT  Interval:  420 QTC Calculation: 487 R Axis:   -54  Text Interpretation: Normal sinus rhythm Left axis deviation Right bundle branch block When compared with ECG of 18-Jul-2023 10:55, Right bundle branch block is now Present Inferior infarct is now Present Confirmed by Wallis Bamberg [phone]) on 09/11/2023 1:33:12 PM    Cardiac Studies & Procedures     STRESS TESTS  MYOCARDIAL PERFUSION IMAGING 05/12/2022  Narrative   The study is normal. The study is low risk.   Left ventricular function is normal. Nuclear stress EF: 76 %. The left ventricular ejection fraction is hyperdynamic (>65%). End diastolic cavity size is normal.   Good exercise capacity.  ECHOCARDIOGRAM  ECHOCARDIOGRAM COMPLETE 05/17/2022  Narrative ECHOCARDIOGRAM REPORT    Patient Name:   Olivia Cruz Date of Exam: 05/17/2022 Medical Rec #:  [id number]          Height:       60.5 in Accession #:    [id number]         Weight:       154.0 lb Date of Birth:  [birth year]          BSA:          1.681 m Patient Age:    71 years           BP:           128/80 mmHg Patient Gender: F  HR:           82 bpm. Exam Location:  [facility]  Procedure: 2D Echo, Cardiac Doppler, Color Doppler and Strain Analysis  Indications:    Cardiac murmur [R01.1 (ICD-10-CM)]  History:        Patient has no prior history of Echocardiogram examinations. Signs/Symptoms:Murmur; Risk Factors:Dyslipidemia.  Sonographer:    Margreta Journey RDCS Referring Phys: Rito Ehrlich [facility]  IMPRESSIONS   1. Left ventricular ejection fraction, by estimation, is 55 to 60%. The left ventricle has normal function. The left ventricle has no regional wall motion abnormalities. There is mild concentric left ventricular hypertrophy. Left ventricular diastolic parameters are consistent with Grade I diastolic dysfunction (impaired relaxation). The average left ventricular global longitudinal strain is -15.7 %. 2. Right ventricular systolic function  is normal. The right ventricular size is normal. 3. The mitral valve is normal in structure. Trivial mitral valve regurgitation. No evidence of mitral stenosis. 4. The aortic valve is normal in structure. Aortic valve regurgitation is not visualized. 5. Aortic Normal DTA. 6. The inferior vena cava is normal in size with greater than 50% respiratory variability, suggesting right atrial pressure of 3 mmHg.  FINDINGS Left Ventricle: Left ventricular ejection fraction, by estimation, is 55 to 60%. The left ventricle has normal function. The left ventricle has no regional wall motion abnormalities. The average left ventricular global longitudinal strain is -15.7 %. The left ventricular internal cavity size was normal in size. There is mild concentric left ventricular hypertrophy. Left ventricular diastolic parameters are consistent with Grade I diastolic dysfunction (impaired relaxation). Indeterminate filling pressures.  Right Ventricle: The right ventricular size is normal. No increase in right ventricular wall thickness. Right ventricular systolic function is normal.  Left [facility]: Left atrial size was normal in size.  Right [facility]: Right atrial size was normal in size.  Pericardium: There is no evidence of pericardial effusion.  Mitral Valve: The mitral valve is normal in structure. Trivial mitral valve regurgitation. No evidence of mitral valve stenosis.  Tricuspid Valve: The tricuspid valve is normal in structure. Tricuspid valve regurgitation is not demonstrated. No evidence of tricuspid stenosis.  Aortic Valve: The aortic valve is normal in structure. Aortic valve regurgitation is not visualized.  Pulmonic Valve: The pulmonic valve was normal in structure. Pulmonic valve regurgitation is not visualized. No evidence of pulmonic stenosis.  Aorta: The aortic arch was not well visualized, the aortic root and ascending aorta are structurally normal, with no evidence of dilitation and Normal  DTA.  Venous: A normal flow pattern is recorded from the right upper pulmonary vein. The inferior vena cava is normal in size with greater than 50% respiratory variability, suggesting right atrial pressure of 3 mmHg.  IAS/Shunts: No atrial level shunt detected by color flow Doppler.   LEFT VENTRICLE PLAX 2D LVIDd:         3.30 cm   Diastology LVIDs:         2.30 cm   LV e' medial:    6.20 cm/s LV PW:         1.10 cm   LV E/e' medial:  8.7 LV IVS:        1.40 cm   LV e' lateral:   10.20 cm/s LVOT diam:     1.90 cm   LV E/e' lateral: 5.3 LV SV:         54 LV SV Index:   32        2D Longitudinal Strain LVOT Area:  2.84 cm  2D Strain GLS Avg:     -15.7 %   RIGHT VENTRICLE            IVC RV Basal diam:  2.20 cm    IVC diam: 1.40 cm RV S prime:     9.57 cm/s TAPSE (M-mode): 2.2 cm  LEFT ATRIUM             Index        RIGHT ATRIUM          Index LA diam:        3.10 cm 1.84 cm/m   RA Area:     8.22 cm LA Vol (A2C):   23.3 ml 13.86 ml/m  RA Volume:   15.20 ml 9.04 ml/m LA Vol (A4C):   18.7 ml 11.13 ml/m LA Biplane Vol: 21.5 ml 12.79 ml/m AORTIC VALVE LVOT Vmax:   95.00 cm/s LVOT Vmean:  68.500 cm/s LVOT VTI:    0.190 m  AORTA Ao Root diam: 3.30 cm Ao Asc diam:  3.40 cm Ao Desc diam: 2.20 cm  MITRAL VALVE MV Area (PHT): 3.23 cm    SHUNTS MV Decel Time: 235 msec    Systemic VTI:  0.19 m MV E velocity: 54.20 cm/s  Systemic Diam: 1.90 cm MV A velocity: 97.90 cm/s MV E/A ratio:  0.55  Norman Herrlich MD Electronically signed by Norman Herrlich MD Signature Date/Time: 05/17/2022/5:53:31 PM    Final             Risk Assessment/Calculations:             Physical Exam:   VS:  BP 110/74 (BP Location: Left Arm, Patient Position: Sitting, Cuff Size: Small)   Pulse 81   Ht 5' 0.2" (1.529 m)   Wt 162 lb (73.5 kg)   LMP 08/22/2005   SpO2 96%   BMI 31.43 kg/m    Wt Readings from Last 3 Encounters:  09/11/23 162 lb (73.5 kg)  08/31/23 162 lb 6.4 oz (73.7 kg)   07/18/23 161 lb 3.2 oz (73.1 kg)    GEN: Well nourished, well developed in no acute distress NECK: No JVD; No carotid bruits CARDIAC: RRR, no murmurs, rubs, gallops RESPIRATORY:  Clear to auscultation without rales, wheezing or rhonchi  ABDOMEN: Soft, non-tender, non-distended EXTREMITIES:  No edema; No deformity   ASSESSMENT AND PLAN: .   Right bundle branch block-this is a new finding.  EKG in November 2024 without evidence of RBBB.  She has had some complaints of chest pain which has mixed features.  Will investigate this further with coronary CTA.  Chest pain of uncertain origin-again, has mixed features, some sound to be consistent with angina, others GERD.  At times she is able to be active without any episodes of angina.  She has taken nitroglycerin on a few occasions and it has helped.  We reviewed ED precautions.  Will proceed with a coronary CTA for further evaluation.  Dyslipidemia-was previously on Lipitor however unable to tolerate.  Will check LFTs, direct LDL, LPA and then decide on what medication for lipid-lowering therapy.  Preoperative cardiovascular evaluation-we need to evaluate her new RBBB and episodes of chest pain further before she can proceed with TKR.  Will update her surgeons office.  **addendum -- patient underwent a coronary CTA following a new RBBB noted on EKG. cCTA was normal. From a cardiac perspective, she is cleared to proceed with surgery at an acceptable risk.        Dispo:  Coronary CTA, CBC, CMET, direct LDL, LPA.  Signed, Flossie Dibble, NP

## 2023-09-11 ENCOUNTER — Ambulatory Visit: Payer: HMO | Attending: Cardiology | Admitting: Cardiology

## 2023-09-11 ENCOUNTER — Encounter: Payer: Self-pay | Admitting: Cardiology

## 2023-09-11 ENCOUNTER — Encounter: Payer: Self-pay | Admitting: Gastroenterology

## 2023-09-11 VITALS — BP 110/74 | HR 81 | Ht 60.2 in | Wt 162.0 lb

## 2023-09-11 DIAGNOSIS — E782 Mixed hyperlipidemia: Secondary | ICD-10-CM

## 2023-09-11 DIAGNOSIS — Z01810 Encounter for preprocedural cardiovascular examination: Secondary | ICD-10-CM | POA: Diagnosis not present

## 2023-09-11 DIAGNOSIS — R079 Chest pain, unspecified: Secondary | ICD-10-CM

## 2023-09-11 DIAGNOSIS — R011 Cardiac murmur, unspecified: Secondary | ICD-10-CM | POA: Diagnosis not present

## 2023-09-11 DIAGNOSIS — R072 Precordial pain: Secondary | ICD-10-CM

## 2023-09-11 DIAGNOSIS — I4510 Unspecified right bundle-branch block: Secondary | ICD-10-CM

## 2023-09-11 MED ORDER — METOPROLOL TARTRATE 100 MG PO TABS: 100.0000 mg | ORAL_TABLET | Freq: Once | ORAL | 0 refills | Status: DC

## 2023-09-11 NOTE — Patient Instructions (Signed)
Medication Instructions:  Your physician recommends that you continue on your current medications as directed. Please refer to the Current Medication list given to you today.  *If you need a refill on your cardiac medications before your next appointment, please call your pharmacy*   Lab Work: Your physician recommends that you return for lab work in: Today for CMP, CBC, Direct LDL and an LPa  If you have labs (blood work) drawn today and your tests are completely normal, you will receive your results only by: MyChart Message (if you have MyChart) OR A paper copy in the mail If you have any lab test that is abnormal or we need to change your treatment, we will call you to review the results.   Testing/Procedures: Please arrive at the [facility] main entrance of [facility] at xx:xx AM (30-45 minutes prior to test start time)  [facility] [address] [place], [place] [phone]  1st floor beside the elevator  Please follow these instructions carefully (unless otherwise directed):    On the Night Before the Test: Drink plenty of water. Do not consume any caffeinated/decaffeinated beverages or chocolate 12 hours prior to your test. Do not take any antihistamines 12 hours prior to your test. On the Day of the Test: Drink plenty of water. Do not drink any water within one hour of the test. Do not eat any food 4 hours prior to the test. You may take your regular medications prior to the test. IF NOT ON A BETA BLOCKER - Take 100 mg of lopressor (metoprolol) two hours before the test.   After the Test: Drink plenty of water. After receiving IV contrast, you may experience a mild flushed feeling. This is normal. On occasion, you may experience a mild rash up to 24 hours after the test. This is not dangerous. If this occurs, you can take Benadryl 25 mg and increase your fluid intake. If you experience trouble breathing, this can be serious. If it is severe call 911  IMMEDIATELY. If it is mild, please call our office.   Follow-Up: At [facility], you and your health needs are our priority.  As part of our continuing mission to provide you with exceptional heart care, we have created designated Provider Care Teams.  These Care Teams include your primary Cardiologist (physician) and Advanced Practice Providers (APPs -  Physician Assistants and Nurse Practitioners) who all work together to provide you with the care you need, when you need it.  We recommend signing up for the patient portal called "MyChart".  Sign up information is provided on this After Visit Summary.  MyChart is used to connect with patients for Virtual Visits (Telemedicine).  Patients are able to view lab/test results, encounter notes, upcoming appointments, etc.  Non-urgent messages can be sent to your provider as well.   To learn more about what you can do with MyChart, go to [email or web address].    Your next appointment:  Depends on CT results    Provider:   Wallis Bamberg, NP Rosalita Levan)    Other Instructions

## 2023-09-12 ENCOUNTER — Telehealth: Payer: Self-pay | Admitting: Cardiology

## 2023-09-12 LAB — COMPREHENSIVE METABOLIC PANEL WITH GFR
ALT: 26 IU/L (ref 0–32)
AST: 27 IU/L (ref 0–40)
Albumin: 4.4 g/dL (ref 3.8–4.8)
Alkaline Phosphatase: 129 IU/L — ABNORMAL HIGH (ref 44–121)
BUN/Creatinine Ratio: 14 (ref 12–28)
BUN: 10 mg/dL (ref 8–27)
Bilirubin Total: 0.3 mg/dL (ref 0.0–1.2)
CO2: 23 mmol/L (ref 20–29)
Calcium: 9.7 mg/dL (ref 8.7–10.3)
Chloride: 104 mmol/L (ref 96–106)
Creatinine, Ser: 0.69 mg/dL (ref 0.57–1.00)
Globulin, Total: 2.5 g/dL (ref 1.5–4.5)
Glucose: 100 mg/dL — ABNORMAL HIGH (ref 70–99)
Potassium: 4.2 mmol/L (ref 3.5–5.2)
Sodium: 143 mmol/L (ref 134–144)
Total Protein: 6.9 g/dL (ref 6.0–8.5)
eGFR: 92 mL/min/1.73

## 2023-09-12 LAB — CBC WITH DIFFERENTIAL/PLATELET
Basophils Absolute: 0 x10E3/uL (ref 0.0–0.2)
Basos: 1 %
EOS (ABSOLUTE): 0.2 x10E3/uL (ref 0.0–0.4)
Eos: 3 %
Hematocrit: 44.8 % (ref 34.0–46.6)
Hemoglobin: 14.8 g/dL (ref 11.1–15.9)
Immature Grans (Abs): 0 x10E3/uL (ref 0.0–0.1)
Immature Granulocytes: 0 %
Lymphocytes Absolute: 2.3 x10E3/uL (ref 0.7–3.1)
Lymphs: 37 %
MCH: 29 pg (ref 26.6–33.0)
MCHC: 33 g/dL (ref 31.5–35.7)
MCV: 88 fL (ref 79–97)
Monocytes Absolute: 0.5 x10E3/uL (ref 0.1–0.9)
Monocytes: 8 %
Neutrophils Absolute: 3.1 x10E3/uL (ref 1.4–7.0)
Neutrophils: 51 %
Platelets: 280 x10E3/uL (ref 150–450)
RBC: 5.1 x10E6/uL (ref 3.77–5.28)
RDW: 13.8 % (ref 11.7–15.4)
WBC: 6.1 x10E3/uL (ref 3.4–10.8)

## 2023-09-12 NOTE — Telephone Encounter (Signed)
Attempted to call patient x 2 with no answer

## 2023-09-12 NOTE — Telephone Encounter (Signed)
Patient states after seeing Wallis Bamberg yesterday she knows she will be unable to proceed with initial procedure. She would like to know if she can have cataracts surgery.

## 2023-09-13 LAB — LDL CHOLESTEROL, DIRECT: LDL Direct: 105 mg/dL — ABNORMAL HIGH (ref 0–99)

## 2023-09-13 LAB — LIPOPROTEIN A (LPA): Lipoprotein (a): 19.4 nmol/L

## 2023-09-13 NOTE — Telephone Encounter (Signed)
Called and spoke to patient. Reviewed Olivia Cruz recommendation. Patient verbailzed understanding and had no further questions.

## 2023-09-14 ENCOUNTER — Telehealth: Payer: Self-pay | Admitting: Cardiology

## 2023-09-14 DIAGNOSIS — E782 Mixed hyperlipidemia: Secondary | ICD-10-CM

## 2023-09-14 NOTE — Telephone Encounter (Signed)
LVM for pt to call if we need to schedule ECHO and sleep study.

## 2023-09-14 NOTE — Telephone Encounter (Signed)
Pt went to Pre-Admission yesterday and something was abn with EKG. Now they are wanting her to do an Echo and Sleep Study. Pt wants Wallis Bamberg to know

## 2023-09-15 MED ORDER — ROSUVASTATIN CALCIUM 20 MG PO TABS: 20.0000 mg | ORAL_TABLET | ORAL | 3 refills | Status: DC

## 2023-09-18 ENCOUNTER — Other Ambulatory Visit: Payer: HMO

## 2023-09-18 ENCOUNTER — Ambulatory Visit: Payer: HMO | Admitting: Gastroenterology

## 2023-09-18 ENCOUNTER — Other Ambulatory Visit (INDEPENDENT_AMBULATORY_CARE_PROVIDER_SITE_OTHER): Payer: HMO

## 2023-09-18 ENCOUNTER — Encounter: Payer: Self-pay | Admitting: Gastroenterology

## 2023-09-18 ENCOUNTER — Ambulatory Visit (INDEPENDENT_AMBULATORY_CARE_PROVIDER_SITE_OTHER)
Admission: RE | Admit: 2023-09-18 | Discharge: 2023-09-18 | Disposition: A | Discharge status: Home / Self Care | Payer: HMO | Source: Ambulatory Visit | Attending: Gastroenterology | Admitting: Gastroenterology

## 2023-09-18 VITALS — BP 124/70 | HR 72 | Ht 60.0 in | Wt 161.0 lb

## 2023-09-18 DIAGNOSIS — K5909 Other constipation: Secondary | ICD-10-CM

## 2023-09-18 DIAGNOSIS — R1084 Generalized abdominal pain: Secondary | ICD-10-CM

## 2023-09-18 DIAGNOSIS — K5900 Constipation, unspecified: Secondary | ICD-10-CM | POA: Diagnosis not present

## 2023-09-18 DIAGNOSIS — R109 Unspecified abdominal pain: Secondary | ICD-10-CM

## 2023-09-18 LAB — TSH: TSH: 1.9 u[IU]/mL (ref 0.35–5.50)

## 2023-09-18 NOTE — Patient Instructions (Addendum)
Start taking Miralax 1 capful (17 grams) 1x / day for 1 week.   If this is not effective, increase to 1 dose 2x / day for 1 week.   If this is still not effective, increase to two capfuls (34 grams) 2x / day.   Can adjust dose as needed based on response. Can take 1/2 cap daily, skip days, or increase per day.    Drink 64oz-80oz water daily  Your provider has requested that you go to the basement level for lab work before leaving today. Press "B" on the elevator. The lab is located at the first door on the left as you exit the elevator.  _______________________________________________________  If your blood pressure at your visit was 140/90 or greater, please contact your primary care physician to follow up on this.  _______________________________________________________  If you are age 35 or older, your body mass index should be between 23-30. Your Body mass index is 31.44 kg/m. If this is out of the aforementioned range listed, please consider follow up with your Primary Care Provider.  If you are age 59 or younger, your body mass index should be between 19-25. Your Body mass index is 31.44 kg/m. If this is out of the aformentioned range listed, please consider follow up with your Primary Care Provider.   ________________________________________________________  The [facility] providers would like to encourage you to use [facility] to communicate with providers for non-urgent requests or questions.  Due to long hold times on the telephone, sending your provider a message by [facility] may be a faster and more efficient way to get a response.  Please allow 48 business hours for a response.  Please remember that this is for non-urgent requests.  _______________________________________________________ It was a pleasure to see you today!  Thank you for trusting me with your gastrointestinal care!

## 2023-09-18 NOTE — Progress Notes (Signed)
Chief Complaint: Constipation Primary GI MD: Dr. Chales Abrahams  HPI: 73 year old female with medical history as listed below presents for acute on chronic constipation  Patient referred here by PCP for constipation. Was seen at [facility] 07/17/23. She was originally seen at Urgent care who was concerned for obstruction. Workup in ED was negative for obstruction and she was discharged with Bentyl and Miralax.  Labs 09/11/23 Normal CBC CMP with slightly elevated alk phos of 129  Discussed the use of AI scribe software for clinical note transcription with the patient, who gave verbal consent to proceed.  The patient, with a history of psoriatic arthritis and recent back surgery, presents with a new onset of constipation since February.  She states she is always dealt with hard stools and pellet-like stools.  She reports episodes of severe abdominal cramping and diarrhea, which she initially attributed to a possible obstruction. In November, she was evaluated at an urgent care center where an x-ray revealed a significant stool burden. She was treated with magnesium citrate, which initially relieved her symptoms. However, a week later, the abdominal cramping returned, and she was not passing stool regularly.  At the emergency room, a CT scan was performed, which showed some fecal matter but no obstruction. She was prescribed Miralax and Bentyl. The patient has been taking Miralax every three days and Bentyl once a week when the abdominal cramping becomes severe. She reports that her bowel movements have changed from daily to less frequent, and when they do occur, they often consist of hard stools or small balls.  The patient also reports episodes of fecal incontinence, describing instances where a small ball of stool will unexpectedly pass. She has been managing this new onset of constipation with over-the-counter remedies, including stool softeners and Miralax, but these have not returned her bowel  movements to normal.  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 However, due to recent knee problems and a knee replacement surgery, her level of physical activity has decreased. She also reports a recent weight gain and acknowledges that she is not drinking enough water.     Of note, she was scheduled to get a knee surgery tomorrow (1/28) but during her to preop evaluation she had an abnormal EKG and is currently scheduled for a cardiac workup.  No cardiac symptoms at this time.  She was recommended to avoid anesthesia  PREVIOUS GI WORKUP   Colonoscopy 12/07/2017 - Moderate to severe sigmoid diverticulosis.  - Small internal hemorrhoids  - Otherwise normal colonoscopy to terminal ileum.  ECHO 04/2022: EF 55-60%  Past Medical History:  Diagnosis Date   Achilles tendonosis 10/06/2020   Acute cystitis with hematuria 09/29/2019   Acute infection of nasal sinus 06/21/2021   Allergic rhinoconjunctivitis    Anxiety    Asthma    Cardiac murmur 05/04/2022   Cervical os stenosis 04/09/2015   Chest pain    Chest pain of uncertain etiology 05/04/2022   Chronic back pain 09/23/2021   Chronic knee pain after total replacement of right knee joint 07/21/2020   Chronic right shoulder pain 05/25/2021   Depression    Depression, major, recurrent, mild (HCC)    Diverticulitis    Dysuria 07/12/2021   GERD (gastroesophageal reflux disease)    Glaucoma    Heart murmur    Hypercholesterolemia    Hypothyroidism    Ingrown nail 02/04/2021   Insomnia    Laryngopharyngeal reflux (LPR)  Memory changes    Migraine without aura and without status migrainosus, not intractable 05/07/2021   Migraines    Mild recurrent major depression (HCC) 07/12/2021   Mixed hyperlipidemia 12/22/2019   Muscle tension dysphonia 09/12/2018   Muscle wasting and atrophy, not elsewhere classified, left hand 12/01/2020   Nodule of finger of  right hand 12/01/2020   Osteoarthritis    Osteopenia    Pain and swelling of right knee 12/25/2018   Postmenopausal atrophic vaginitis 04/09/2015   Prediabetes 12/22/2019   Psoriatic arthritis (HCC)    RBBB    RLS (restless legs syndrome)    Seasonal allergic rhinitis due to pollen 06/21/2021   Secondary hypothyroidism 12/22/2019   Skin cancer    Snoring 05/07/2021   Spondylolisthesis at L4-L5 level 04/27/2022   Status post total right knee replacement 12/29/2021   STD (sexually transmitted disease)    HSV II   Tendinopathy of right rotator cuff 05/25/2021   Tightness of heel cord, left 11/10/2020   Unilateral primary osteoarthritis, left knee 09/22/2022   Vocal fold atrophy 09/12/2018    Past Surgical History:  Procedure Laterality Date   BACK SURGERY  2024   fusion of L4 and L5   BELPHAROPTOSIS REPAIR     BREAST SURGERY Left    times 2   CARDIAC CATHETERIZATION  10/05/2015   COLONOSCOPY  06/03/2013   Moderate predominantly sigmoid diverticulosis. Small interal hemorroids   FOOT SURGERY Right    Removed bone spur   GANGLION CYST EXCISION Left    foot   HEMORRHOID SURGERY     SKIN CANCER EXCISION     TOTAL KNEE ARTHROPLASTY Right 07/21/2020   UPPER GI ENDOSCOPY  03/23/2016   Mild gastritis, gastric polyps bxbenign squamous mucosa with no abnormaility, fundic glad poly in setting of mild chron gastritis.    Current Outpatient Medications  Medication Sig Dispense Refill   acetaminophen (TYLENOL) 160 MG/5ML liquid Take by mouth every 4 (four) hours as needed for fever.     Adalimumab (HUMIRA PEN) 40 MG/0.4ML PNKT Inject 40 mg into the skin once a week. Unsure if dose is 40 mg- every other week     ascorbic acid (VITAMIN C) 500 MG tablet Take 500 mg by mouth daily.     beclomethasone (QVAR REDIHALER) 40 MCG/ACT inhaler INHALE 2 INHALATIONS BY MOUTH 1  TO 2 TIMES DAILY TO PREVENT  COUGH OR WHEEZE. RINSE, GARGLE,  AND SPIT AFTER USE 42.4 g 0   Biotin w/ Vitamins C & E  (HAIR/SKIN/NAILS PO) Take 1 tablet by mouth daily.     budesonide (RHINOCORT AQUA) 32 MCG/ACT nasal spray Place 1 spray into both nostrils daily. Reported on 02/04/2016 8.43 mL 3   CALCIUM MAGNESIUM ZINC PO Take by mouth.     Calcium-Vitamin D-Vitamin K [id number] MG-UNT-MCG TABS Take 1 tablet by mouth daily.     cetirizine (ZYRTEC) 10 MG tablet Take 1 tablet (10 mg total) by mouth daily as needed for allergies. 90 tablet 1   clindamycin (CLEOCIN) 300 MG capsule Take 300 mg by mouth as needed. AS NEEDED FOR DENTAL PROCEDURE     diclofenac Sodium (VOLTAREN) 1 % GEL Apply 1 Application topically as needed for pain (Knee pain).     dicyclomine (BENTYL) 20 MG tablet Take 20 mg by mouth every 6 (six) hours as needed for spasms.     famotidine (PEPCID) 40 MG tablet TAKE 1 TABLET BY MOUTH DAILY AT  BEDTIME 90 tablet 1   gabapentin (NEURONTIN)  300 MG capsule Take 300 mg by mouth in the morning, at noon, and at bedtime.     levothyroxine (SYNTHROID) 50 MCG tablet TAKE 1 TABLET BY MOUTH DAILY 90 tablet 3   methocarbamol (ROBAXIN) 500 MG tablet Take 1 tablet (500 mg total) by mouth every 6 (six) hours as needed for muscle spasms. 30 tablet 3   Multiple Vitamin (MULTIVITAMIN WITH MINERALS) TABS tablet Take 1 tablet by mouth daily. Woman 50+     nitrofurantoin (MACRODANTIN) 100 MG capsule Take 100 mg by mouth as needed (after  intercourse).     nitroGLYCERIN (NITROSTAT) 0.4 MG SL tablet Place 1 tablet (0.4 mg total) under the tongue every 5 (five) minutes as needed for chest pain. 25 tablet 6   pantoprazole (PROTONIX) 40 MG tablet Take 1 tablet 1-2 times per day 180 tablet 1   Propylene Glycol, PF, (SYSTANE COMPLETE PF) 0.6 % SOLN Place 1 drop into both eyes daily as needed (dry eyes).     rosuvastatin (CRESTOR) 20 MG tablet Take 1 tablet (20 mg total) by mouth every Monday, Wednesday, and Friday. 36 tablet 3   valACYclovir (VALTREX) 500 MG tablet Take 1 tablet (500 mg total) by mouth 3 (three) times daily as  needed (herpes outbreak). 21 tablet 0   metoprolol tartrate (LOPRESSOR) 100 MG tablet Take 1 tablet (100 mg total) by mouth once for 1 dose. Take 2 hours prior to CT 1 tablet 0   naproxen sodium (ALEVE) 220 MG tablet Take 220 mg by mouth daily as needed (headache). (Patient not taking: Reported on 09/11/2023)     No current facility-administered medications for this visit.    Allergies as of 09/18/2023 - Review Complete 09/18/2023  Allergen Reaction Noted   Codeine Anaphylaxis and Nausea Only 02/14/2013   Ambien [zolpidem tartrate] Other (See Comments) 09/05/2016   Clonazepam  01/05/2021   Cyproheptadine hcl Other (See Comments) 09/26/2019   Dilaudid [hydromorphone]  09/18/2017   Hydrocodone Other (See Comments) 09/28/2015   Lisinopril Cough 09/26/2019   Pramipexole Nausea Only 05/07/2021   Sulfa antibiotics Other (See Comments) 02/14/2013   Tramadol Nausea And Vomiting 09/28/2015    Family History  Problem Relation Age of Onset   Breast cancer Sister        lung   Cancer Maternal Grandmother    Multiple births Maternal Grandmother    Multiple births Paternal Grandmother    Cancer Paternal Grandmother    Thyroid disease Mother    Heart failure Mother    Heart disease Mother    Hyperlipidemia Mother    Hypertension Mother    Stroke Mother    Depression Mother    Parkinson's disease Father    Glaucoma Father    Diverticulitis Father     Social History   Socioeconomic History   Marital status: Married    Spouse name: Not on file   Number of children: 2   Years of education: Not on file   Highest education level: Not on file  Occupational History   Occupation: [employer]  Tobacco Use   Smoking status: Former    Current packs/day: 0.00    Types: Cigarettes    Quit date: 1986    Years since quitting: 39.0   Smokeless tobacco: Never  Vaping Use   Vaping status: Never Used  Substance and Sexual Activity   Alcohol use: No    Alcohol/week: 0.0  standard drinks of alcohol   Drug use: No   Sexual activity: Yes  Partners: Male    Birth control/protection: Post-menopausal    Comment: husband vasectomy  Other Topics Concern   Not on file  Social History Narrative   Right handed   Drinks caffeine prn   Social Drivers of Health   Financial Resource Strain: Low Risk  (10/07/2021)   Overall Financial Resource Strain (CARDIA)    Difficulty of Paying Living Expenses: Not very hard  Food Insecurity: No Food Insecurity (12/02/2020)   Hunger Vital Sign    Worried About Running Out of Food in the Last Year: Never true    Ran Out of Food in the Last Year: Never true  Transportation Needs: No Transportation Needs (10/07/2021)   PRAPARE - Administrator, Civil Service (Medical): No    Lack of Transportation (Non-Medical): No  Physical Activity: Sufficiently Active (12/02/2020)   Exercise Vital Sign    Days of Exercise per Week: 5 days    Minutes of Exercise per Session: 30 min  Stress: No Stress Concern Present (12/02/2020)   [employer] of Occupational Health - Occupational Stress Questionnaire    Feeling of Stress : Only a little  Social Connections: Socially Integrated (12/02/2020)   Social Connection and Isolation Panel [NHANES]    Frequency of Communication with Friends and Family: More than three times a week    Frequency of Social Gatherings with Friends and Family: More than three times a week    Attends Religious Services: More than 4 times per year    Active Member of [employer] or Organizations: Yes    Attends Engineer, structural: More than 4 times per year    Marital Status: Married  Catering manager Violence: Not At Risk (12/02/2020)   Humiliation, Afraid, Rape, and Kick questionnaire    Fear of Current or Ex-Partner: No    Emotionally Abused: No    Physically Abused: No    Sexually Abused: No    Review of Systems:    Constitutional: No weight loss, fever, chills, weakness or fatigue HEENT:  Eyes: No change in vision               Ears, Nose, Throat:  No change in hearing or congestion Skin: No rash or itching Cardiovascular: No chest pain, chest pressure or palpitations   Respiratory: No SOB or cough Gastrointestinal: See HPI and otherwise negative Genitourinary: No dysuria or change in urinary frequency Neurological: No headache, dizziness or syncope Musculoskeletal: No new muscle or joint pain Hematologic: No bleeding or bruising Psychiatric: No history of depression or anxiety    Physical Exam:  Vital signs: BP 124/70   Pulse 72   Ht 5' (1.524 m)   Wt 161 lb (73 kg)   LMP 08/22/2005   BMI 31.44 kg/m   Constitutional: NAD, Well developed, Well nourished, alert and cooperative Head:  Normocephalic and atraumatic. Eyes:   PEERL, EOMI. No icterus. Conjunctiva pink. Respiratory: Respirations even and unlabored. Lungs clear to auscultation bilaterally.   No wheezes, crackles, or rhonchi.  Cardiovascular:  Regular rate and rhythm. No peripheral edema, cyanosis or pallor.  Gastrointestinal:  Soft, nondistended, nontender. No rebound or guarding.  Hypoactive bowel sounds. No appreciable masses or hepatomegaly. Rectal:  Not performed.  Msk:  Symmetrical without gross deformities. Without edema, no deformity or joint abnormality.  Neurologic:  Alert and  oriented x4;  grossly normal neurologically.  Skin:   Dry and intact without significant lesions or rashes. Psychiatric: Oriented to person, place and time. Demonstrates good judgement and reason  without abnormal affect or behaviors.  RELEVANT LABS AND IMAGING: CBC    Component Value Date/Time   WBC 6.1 09/11/2023 1429   WBC 13.5 (H) 01/31/2023 0742   RBC 5.10 09/11/2023 1429   RBC 3.83 (L) 01/31/2023 0742   HGB 14.8 09/11/2023 1429   HGB 14.2 02/19/2014 1453   HCT 44.8 09/11/2023 1429   PLT 280 09/11/2023 1429   MCV 88 09/11/2023 1429   MCH 29.0 09/11/2023 1429   MCH 29.8 01/31/2023 0742   MCHC 33.0 09/11/2023  1429   MCHC 33.3 01/31/2023 0742   RDW 13.8 09/11/2023 1429   LYMPHSABS 2.3 09/11/2023 1429   EOSABS 0.2 09/11/2023 1429   BASOSABS 0.0 09/11/2023 1429    CMP     Component Value Date/Time   NA 143 09/11/2023 1429   K 4.2 09/11/2023 1429   CL 104 09/11/2023 1429   CO2 23 09/11/2023 1429   GLUCOSE 100 (H) 09/11/2023 1429   GLUCOSE 142 (H) 01/31/2023 0742   BUN 10 09/11/2023 1429   CREATININE 0.69 09/11/2023 1429   CREATININE 0.71 02/18/2013 1029   CALCIUM 9.7 09/11/2023 1429   PROT 6.9 09/11/2023 1429   ALBUMIN 4.4 09/11/2023 1429   AST 27 09/11/2023 1429   ALT 26 09/11/2023 1429   ALKPHOS 129 (H) 09/11/2023 1429   BILITOT 0.3 09/11/2023 1429   GFRNONAA >60 01/31/2023 0742   GFRAA 102 09/15/2020 1101     Assessment/Plan:      Acute on chronic Constipation History of hard stools and stool burden. Recent episodes of severe abdominal cramping and worsening constipation with occasional episodes of suspected overflow diarrhea.. Currently on Miralax every three days and Bentyl as needed for cramping.  Recently seen at at [facility] where a CT was obtained.  Suspect her chronic constipation has worsened over the last few months due to becoming more sedentary s/p knee replacement and needing to replace her left knee now.  She also admits to poor water intake. --Increase Miralax to daily use. --Order abdominal x-ray to assess for stool burden. --Check thyroid function to rule out hypothyroidism as a cause of constipation. --Encourage increased water intake to at least 64-80 ounces per day. --Discussed getting colonoscopy due to change in bowel habits.  With recent preop abnormal EKG and currently getting cardiac workup would recommend holding off for now until cardiac workup is complete.  Reassuringly she had a normal colonoscopy in 2019 with repeat in 2029. - Obtain hospital records including CT scan -Follow-up 8 to 12 weeks.  Please call if symptoms worsen.   Boone Master, Cordelia Poche [facility] 09/18/2023, 2:31 PM  Cc: Deatra James, MD

## 2023-09-19 ENCOUNTER — Telehealth (HOSPITAL_COMMUNITY): Payer: Self-pay | Admitting: *Deleted

## 2023-09-19 NOTE — Telephone Encounter (Signed)
Reaching out to patient to offer assistance regarding upcoming cardiac imaging study; pt verbalizes understanding of appt date/time, parking situation and where to check in, pre-test NPO status and medications ordered, and verified current allergies; name and call back number provided for further questions should they arise Olivia Frame RN Navigator Cardiac Imaging [facility] [phone] office [phone] cell  Patient reports HR 80, aware to take 100mg  metoprolol tartrate at 730AM and arrive at 9 AM.

## 2023-09-20 ENCOUNTER — Ambulatory Visit (HOSPITAL_COMMUNITY)
Admission: RE | Admit: 2023-09-20 | Discharge: 2023-09-20 | Disposition: A | Discharge status: Home / Self Care | Payer: HMO | Source: Ambulatory Visit | Attending: Cardiology | Admitting: Cardiology

## 2023-09-20 ENCOUNTER — Encounter (HOSPITAL_COMMUNITY): Payer: Self-pay

## 2023-09-20 DIAGNOSIS — R072 Precordial pain: Secondary | ICD-10-CM

## 2023-09-20 MED ORDER — NITROGLYCERIN 0.4 MG SL SUBL
0.8000 mg | SUBLINGUAL_TABLET | Freq: Once | SUBLINGUAL | Status: AC
Start: 2023-09-20 — End: 2023-09-20
  Administered 2023-09-20: 0.8 mg via SUBLINGUAL

## 2023-09-20 MED ORDER — NITROGLYCERIN 0.4 MG SL SUBL
SUBLINGUAL_TABLET | SUBLINGUAL | Status: AC
  Filled 2023-09-20: qty 2

## 2023-09-20 MED ORDER — IOHEXOL 350 MG/ML SOLN
95.0000 mL | Freq: Once | INTRAVENOUS | Status: AC | PRN
  Administered 2023-09-20: 95 mL via INTRAVENOUS

## 2023-09-20 MED ORDER — DILTIAZEM HCL 25 MG/5ML IV SOLN: 10.0000 mg | INTRAVENOUS | Status: DC | PRN

## 2023-09-21 NOTE — Progress Notes (Signed)
Agree with assessment/plan.  Edman Circle, MD [facility] [phone]

## 2023-09-25 ENCOUNTER — Ambulatory Visit: Payer: HMO | Admitting: Podiatry

## 2023-09-25 ENCOUNTER — Encounter: Payer: Self-pay | Admitting: Podiatry

## 2023-09-25 DIAGNOSIS — M79674 Pain in right toe(s): Secondary | ICD-10-CM

## 2023-09-25 DIAGNOSIS — B351 Tinea unguium: Secondary | ICD-10-CM

## 2023-09-25 NOTE — Progress Notes (Unsigned)
Chief Complaint  Patient presents with   Nail Problem    Nail fungus right hallux, been there for 3  yrs, been trying home rem. And dermatologist gave the medicine that she painted on the nail, does cause pain when she is in certain shoes.      HPI: 73 y.o. female presents today for right hallux nail fungus. Previously tried penlac off and on.  Does have history of psoriatic arthritis, does get routine liver function kidney testing for this.  Does have plans for upcoming left knee replacement.  Past Medical History:  Diagnosis Date   Achilles tendonosis 10/06/2020   Acute cystitis with hematuria 09/29/2019   Acute infection of nasal sinus 06/21/2021   Allergic rhinoconjunctivitis    Anxiety    Asthma    Cardiac murmur 05/04/2022   Cervical os stenosis 04/09/2015   Chest pain    Chest pain of uncertain etiology 05/04/2022   Chronic back pain 09/23/2021   Chronic knee pain after total replacement of right knee joint 07/21/2020   Chronic right shoulder pain 05/25/2021   Depression    Depression, major, recurrent, mild (HCC)    Diverticulitis    Dysuria 07/12/2021   GERD (gastroesophageal reflux disease)    Glaucoma    Heart murmur    Hypercholesterolemia    Hypothyroidism    Ingrown nail 02/04/2021   Insomnia    Laryngopharyngeal reflux (LPR)    Memory changes    Migraine without aura and without status migrainosus, not intractable 05/07/2021   Migraines    Mild recurrent major depression (HCC) 07/12/2021   Mixed hyperlipidemia 12/22/2019   Muscle tension dysphonia 09/12/2018   Muscle wasting and atrophy, not elsewhere classified, left hand 12/01/2020   Nodule of finger of right hand 12/01/2020   Osteoarthritis    Osteopenia    Pain and swelling of right knee 12/25/2018   Postmenopausal atrophic vaginitis 04/09/2015   Prediabetes 12/22/2019   Psoriatic arthritis (HCC)    RBBB    RLS (restless legs syndrome)    Seasonal allergic rhinitis due to pollen  06/21/2021   Secondary hypothyroidism 12/22/2019   Skin cancer    Snoring 05/07/2021   Spondylolisthesis at L4-L5 level 04/27/2022   Status post total right knee replacement 12/29/2021   STD (sexually transmitted disease)    HSV II   Tendinopathy of right rotator cuff 05/25/2021   Tightness of heel cord, left 11/10/2020   Unilateral primary osteoarthritis, left knee 09/22/2022   Vocal fold atrophy 09/12/2018    Past Surgical History:  Procedure Laterality Date   BACK SURGERY  2024   fusion of L4 and L5   BELPHAROPTOSIS REPAIR     BREAST SURGERY Left    times 2   CARDIAC CATHETERIZATION  10/05/2015   COLONOSCOPY  06/03/2013   Moderate predominantly sigmoid diverticulosis. Small interal hemorroids   FOOT SURGERY Right    Removed bone spur   GANGLION CYST EXCISION Left    foot   HEMORRHOID SURGERY     SKIN CANCER EXCISION     TOTAL KNEE ARTHROPLASTY Right 07/21/2020   UPPER GI ENDOSCOPY  03/23/2016   Mild gastritis, gastric polyps bxbenign squamous mucosa with no abnormaility, fundic glad poly in setting of mild chron gastritis.    Allergies  Allergen Reactions   Codeine Anaphylaxis and Nausea Only   Ambien [Zolpidem Tartrate] Other (See Comments)    Memory loss per patient   Clonazepam     Other Reaction(s):  Fatique, Mental Status Changes   Cyproheptadine Hcl Other (See Comments)    Confusion   Dilaudid [Hydromorphone]     hallucinations   Hydrocodone Other (See Comments)    anaphylaxis   Lisinopril Cough   Pramipexole Nausea Only   Sulfa Antibiotics Other (See Comments)    hives   Tramadol Nausea And Vomiting    ROS    Physical Exam: There were no vitals filed for this visit.  General: The patient is alert and oriented x3 in no acute distress.  Dermatology: Skin is warm, dry and supple bilateral lower extremities. Interspaces are clear of maceration and debris.    Vascular: Palpable pedal pulses bilaterally. Capillary refill within normal limits.  No  appreciable edema.  No erythema or calor.  Neurological: Light touch sensation grossly intact bilateral feet.   Musculoskeletal Exam: No pedal deformities noted  Radiographic Exam:  Normal osseous mineralization. Joint spaces preserved.  No fractures or osseous irregularities noted.  Assessment/Plan of Care: No diagnosis found.   No orders of the defined types were placed in this encounter.  None  Discussed clinical findings with patient today.  ***   Luisangel Wainright L. Marchia Bond, AACFAS [facility]     [address] [place], [place] [address]                Office [phone]  Fax [phone]

## 2023-09-28 ENCOUNTER — Telehealth: Payer: Self-pay

## 2023-09-28 NOTE — Telephone Encounter (Signed)
   Pre-operative Risk Assessment    Patient Name: Olivia Cruz  DOB: [birth year] MRN: [id number]   Date of last office visit: 09/11/23 Date of next office visit: N/A   Request for Surgical Clearance    Procedure:   Joint replacement  Date of Surgery:  Clearance TBD                                Surgeon:   Surgeon's Group or Practice Name:  [facility]  Phone number:  [phone] Fax number:  [phone]   Type of Clearance Requested:   - Medical    Type of Anesthesia:  Not Indicated   Additional requests/questions:    SignedAnnabella LITTIE Sayres   09/28/2023, 3:56 PM

## 2023-09-29 DIAGNOSIS — H25811 Combined forms of age-related cataract, right eye: Secondary | ICD-10-CM | POA: Diagnosis not present

## 2023-09-29 DIAGNOSIS — Z01818 Encounter for other preprocedural examination: Secondary | ICD-10-CM | POA: Diagnosis not present

## 2023-09-29 NOTE — Telephone Encounter (Signed)
 Olivia Cruz 73 year old female is requesting preoperative cardiac evaluation for left TKA.  Procedure has not yet been scheduled.  She was last seen by you in clinic on 09/11/2023.  Would you be able to comment on cardiac risk for upcoming surgery.  Thank you for your help.  Please direct your response to CV DIV preop pool.  Olivia Cruz. Olivia Nohr NP-C     09/29/2023, 3:27 PM [facility] [address] Office [phone] Fax [phone]

## 2023-09-29 NOTE — Telephone Encounter (Signed)
 I s/w Dr. Aisha Ali surgery scheduler and she has given me the missing information for the clearance request.    ADDENDUM:  PROCEDURE: LEFT TKA ANESTHESIA: BLOCK SURGEON: DR. SHIELDS

## 2023-09-29 NOTE — Telephone Encounter (Signed)
 Preoperative team, please contact requesting office and let them know that we would like to know details surrounding surgery.  Once we know surgery being performed we will be able to provide recommendations from a cardiac standpoint.  Thank you for your help.  Josefa HERO. Jovee Dettinger NP-C     09/29/2023, 1:16 PM [facility] [address] Office [phone] Fax [phone]

## 2023-10-03 ENCOUNTER — Other Ambulatory Visit: Payer: Self-pay | Admitting: Podiatry

## 2023-10-10 ENCOUNTER — Other Ambulatory Visit: Payer: Self-pay | Admitting: Allergy and Immunology

## 2023-10-18 DIAGNOSIS — H25812 Combined forms of age-related cataract, left eye: Secondary | ICD-10-CM | POA: Diagnosis not present

## 2023-10-23 ENCOUNTER — Encounter: Payer: Self-pay | Admitting: Podiatry

## 2023-10-23 ENCOUNTER — Ambulatory Visit: Payer: HMO | Admitting: Podiatry

## 2023-10-23 DIAGNOSIS — M79674 Pain in right toe(s): Secondary | ICD-10-CM

## 2023-10-23 DIAGNOSIS — B351 Tinea unguium: Secondary | ICD-10-CM

## 2023-10-23 MED ORDER — TERBINAFINE HCL 250 MG PO TABS: 250.0000 mg | ORAL_TABLET | Freq: Every day | ORAL | 0 refills | Status: DC

## 2023-10-23 NOTE — Progress Notes (Signed)
 Chief Complaint  Patient presents with   Nail Problem    Here today for pathology results. Last A1c 5.8 in Jan, no anti coag.     HPI: 73 y.o. female presents today for right hallux nail fungus. Here to review pathology results. Does have history of psoriatic arthritis, does get routine liver function kidney testing for this.  Past Medical History:  Diagnosis Date   Achilles tendonosis 10/06/2020   Acute cystitis with hematuria 09/29/2019   Acute infection of nasal sinus 06/21/2021   Allergic rhinoconjunctivitis    Anxiety    Asthma    Cardiac murmur 05/04/2022   Cervical os stenosis 04/09/2015   Chest pain    Chest pain of uncertain etiology 05/04/2022   Chronic back pain 09/23/2021   Chronic knee pain after total replacement of right knee joint 07/21/2020   Chronic right shoulder pain 05/25/2021   Depression    Depression, major, recurrent, mild (HCC)    Diverticulitis    Dysuria 07/12/2021   GERD (gastroesophageal reflux disease)    Glaucoma    Heart murmur    Hypercholesterolemia    Hypothyroidism    Ingrown nail 02/04/2021   Insomnia    Laryngopharyngeal reflux (LPR)    Memory changes    Migraine without aura and without status migrainosus, not intractable 05/07/2021   Migraines    Mild recurrent major depression (HCC) 07/12/2021   Mixed hyperlipidemia 12/22/2019   Muscle tension dysphonia 09/12/2018   Muscle wasting and atrophy, not elsewhere classified, left hand 12/01/2020   Nodule of finger of right hand 12/01/2020   Osteoarthritis    Osteopenia    Pain and swelling of right knee 12/25/2018   Postmenopausal atrophic vaginitis 04/09/2015   Prediabetes 12/22/2019   Psoriatic arthritis (HCC)    RBBB    RLS (restless legs syndrome)    Seasonal allergic rhinitis due to pollen 06/21/2021   Secondary hypothyroidism 12/22/2019   Skin cancer    Snoring 05/07/2021   Spondylolisthesis at L4-L5 level 04/27/2022   Status post total right knee replacement  12/29/2021   STD (sexually transmitted disease)    HSV II   Tendinopathy of right rotator cuff 05/25/2021   Tightness of heel cord, left 11/10/2020   Unilateral primary osteoarthritis, left knee 09/22/2022   Vocal fold atrophy 09/12/2018    Past Surgical History:  Procedure Laterality Date   BACK SURGERY  2024   fusion of L4 and L5   BELPHAROPTOSIS REPAIR     BREAST SURGERY Left    times 2   CARDIAC CATHETERIZATION  10/05/2015   COLONOSCOPY  06/03/2013   Moderate predominantly sigmoid diverticulosis. Small interal hemorroids   FOOT SURGERY Right    Removed bone spur   GANGLION CYST EXCISION Left    foot   HEMORRHOID SURGERY     SKIN CANCER EXCISION     TOTAL KNEE ARTHROPLASTY Right 07/21/2020   UPPER GI ENDOSCOPY  03/23/2016   Mild gastritis, gastric polyps bxbenign squamous mucosa with no abnormaility, fundic glad poly in setting of mild chron gastritis.    Allergies  Allergen Reactions   Codeine Anaphylaxis and Nausea Only   Ambien [Zolpidem Tartrate] Other (See Comments)    Memory loss per patient   Clonazepam     Other Reaction(s): Fatique, Mental Status Changes   Cyproheptadine Hcl Other (See Comments)    Confusion   Dilaudid [Hydromorphone]     hallucinations   Hydrocodone Other (See Comments)  anaphylaxis   Lisinopril Cough   Pramipexole Nausea Only   Sulfa Antibiotics Other (See Comments)    hives   Tramadol Nausea And Vomiting    ROS denies any nausea, vomiting, fever, chills, chest pain, shortness of breath   Physical Exam: There were no vitals filed for this visit.  General: The patient is alert and oriented x3 in no acute distress.  Dermatology: Skin is warm, dry and supple bilateral lower extremities. Interspaces are clear of maceration and debris.  Right hallux nail plate is thickened, elongated, dystrophic with subungual debris.  Fungal in appearance.  Pain with direct dorsal palpation.   Vascular: Palpable pedal pulses bilaterally.  Capillary refill within normal limits.  No appreciable edema.  No erythema or calor.  Neurological: Light touch sensation grossly intact bilateral feet.   Musculoskeletal Exam: No pedal deformities noted  Assessment/Plan of Care: 1. Pain due to onychomycosis of toenail of right foot      Meds ordered this encounter  Medications   terbinafine (LAMISIL) 250 MG tablet    Sig: Take 1 tablet (250 mg total) by mouth daily.    Dispense:  90 tablet    Refill:  0   None  Discussed clinical findings with patient today.  # Onychomycosis right first toenail - Nail pathology positive for onychomycosis, Trichophyton species. -Treatment options discussed with patient.  Electing to proceed with oral medication.  Reviewed recent CMP and CBC showing no elevation of liver enzymes. -Will proceed with oral Lamisil 250 mg for 90 days.  Will have patient follow-up in 3 months to evaluate signs of progression.   Kardell Virgil L. Marchia Bond, AACFAS [facility]     [address] [place], [place] [address]                Office [phone]  Fax [phone]

## 2023-10-25 ENCOUNTER — Ambulatory Visit: Payer: HMO | Admitting: Gastroenterology

## 2023-10-30 DIAGNOSIS — G4730 Sleep apnea, unspecified: Secondary | ICD-10-CM | POA: Diagnosis not present

## 2023-11-01 ENCOUNTER — Ambulatory Visit
Admission: RE | Admit: 2023-11-01 | Discharge: 2023-11-01 | Disposition: A | Discharge status: Home / Self Care | Source: Ambulatory Visit | Attending: Gastroenterology

## 2023-11-01 ENCOUNTER — Other Ambulatory Visit

## 2023-11-01 ENCOUNTER — Other Ambulatory Visit: Payer: Self-pay

## 2023-11-01 ENCOUNTER — Other Ambulatory Visit: Payer: Self-pay | Admitting: Family Medicine

## 2023-11-01 ENCOUNTER — Telehealth: Payer: Self-pay | Admitting: Internal Medicine

## 2023-11-01 ENCOUNTER — Telehealth: Payer: Self-pay | Admitting: Gastroenterology

## 2023-11-01 DIAGNOSIS — R197 Diarrhea, unspecified: Secondary | ICD-10-CM

## 2023-11-01 DIAGNOSIS — K529 Noninfective gastroenteritis and colitis, unspecified: Secondary | ICD-10-CM | POA: Diagnosis not present

## 2023-11-01 NOTE — Telephone Encounter (Signed)
 See additional phone note entered by Dr Leonides Schanz following patient conversation this AM.

## 2023-11-01 NOTE — Telephone Encounter (Addendum)
 See additional phone note dated 11/01/23.  I have reached back out to patient to advise her to come today for KUB and [employer] testing. She verbalizes understanding. Orders entered in EPIC.

## 2023-11-01 NOTE — Addendum Note (Signed)
 Addended by: Richardson Chiquito on: 11/01/2023 11:45 AM   Modules accepted: Orders

## 2023-11-01 NOTE — Telephone Encounter (Signed)
 Received a call to the [facility] on call pager this morning. Patient states that she has been having diarrhea nonstop since Monday.  She is on average having 1 bowel movement every 15 minutes and has not been able to sleep well.  She has seen scant amounts of blood in her stools which she attributes to hemorrhoids.  Denies abdominal pain.  This diarrhea feels different from her prior diarrhea that she had when she was constipated.  Last month after she saw Bayley in clinic she states that she had improvement in her bowel habits on MiraLAX therapy.  However she just had an acute change with diarrhea starting earlier this week.  Denies fevers.  Denies vomiting.  Denies recent sick exposures. She does have some nausea.  She has been eating minimally and has been attempting to stay hydrated.  I encouraged her to continue to drink beverages that contain electrolytes and to consume bland foods.  I suggested that she come in to drop off a [employer] GI pathogen panel and get a KUB to assess her stool burden to ensure that this is not the overflow diarrhea that what she was experiencing previously.  Will CC Bayley McMichael and Dr. Chales Abrahams to this telephone note.  Will also have pod A triage contact the patient regarding these recommendations.  Please have the [employer] GI pathogen panel and KUB ordered under [facility] name.

## 2023-11-01 NOTE — Telephone Encounter (Signed)
 Noted.

## 2023-11-01 NOTE — Telephone Encounter (Signed)
 Patient complains of severe, explosive, watery diarrhea starting around 430 pm Monday and continuing to present. She states that Monday morning, she began with an"upset stomach" and nausea but no vomiting. Also had lower crampy abdominal discomfort. Felt 'feverish" but did not check her temperature. She indicates that her nausea and abdominal pain subsided but she is having bowel movements every 20-30 minutes, with estimation of 20 BM daily over this time frame. No longer feels feverish. Is having increased gas. Is having some brbpr but in small amounts and she relates this to frequent bathroom trips. Patient states that she recently had cataract surgery and used optic antibiotics which were finished Friday, but no other antibiotic exposure. No sick contacts that she is aware of.  I have asked the patient to push fluids and follow a low fiber diet for the time being.   Please advise on any additional recommendations at this time.Marland KitchenMarland Kitchen

## 2023-11-01 NOTE — Telephone Encounter (Signed)
 Inbound call from patient stating that she had called the after hours doctor and spoke with Dr. Leonides Schanz because she is having issues with watery diarrhea at least every 30 minutes. Patient states that it started on  Monday afternoon after 4:30 and has not stopped. Patient is requesting a call to discuss and see what next steps are. Please advise.

## 2023-11-01 NOTE — Telephone Encounter (Signed)
 Inbound call from patient stating she forgot to mention that she is currently taking terbinafine 250 mg medication when asked about current medications. Stated her provider advised that she could experience nausea and diarrhea but that she would adjust to it. Please advise, thank you.

## 2023-11-02 NOTE — Telephone Encounter (Signed)
Thanks a lot RG 

## 2023-11-02 NOTE — Telephone Encounter (Signed)
 Discussed with pt in detail Diarrhea has stopped She would hold off on stool studies RG

## 2023-11-09 DIAGNOSIS — R059 Cough, unspecified: Secondary | ICD-10-CM | POA: Diagnosis not present

## 2023-11-09 DIAGNOSIS — M79641 Pain in right hand: Secondary | ICD-10-CM | POA: Diagnosis not present

## 2023-11-09 DIAGNOSIS — Z79899 Other long term (current) drug therapy: Secondary | ICD-10-CM | POA: Diagnosis not present

## 2023-11-09 DIAGNOSIS — L4059 Other psoriatic arthropathy: Secondary | ICD-10-CM | POA: Diagnosis not present

## 2023-11-20 ENCOUNTER — Encounter: Payer: Self-pay | Admitting: Cardiology

## 2023-11-20 ENCOUNTER — Encounter: Payer: Self-pay | Admitting: *Deleted

## 2023-11-24 DIAGNOSIS — J040 Acute laryngitis: Secondary | ICD-10-CM | POA: Diagnosis not present

## 2023-11-24 DIAGNOSIS — R053 Chronic cough: Secondary | ICD-10-CM | POA: Diagnosis not present

## 2023-11-30 DIAGNOSIS — J4530 Mild persistent asthma, uncomplicated: Secondary | ICD-10-CM | POA: Diagnosis not present

## 2023-11-30 DIAGNOSIS — J069 Acute upper respiratory infection, unspecified: Secondary | ICD-10-CM | POA: Diagnosis not present

## 2023-12-04 DIAGNOSIS — J383 Other diseases of vocal cords: Secondary | ICD-10-CM | POA: Diagnosis not present

## 2023-12-04 DIAGNOSIS — R053 Chronic cough: Secondary | ICD-10-CM | POA: Diagnosis not present

## 2023-12-04 DIAGNOSIS — K219 Gastro-esophageal reflux disease without esophagitis: Secondary | ICD-10-CM | POA: Diagnosis not present

## 2023-12-04 DIAGNOSIS — J4520 Mild intermittent asthma, uncomplicated: Secondary | ICD-10-CM | POA: Diagnosis not present

## 2023-12-25 DIAGNOSIS — R053 Chronic cough: Secondary | ICD-10-CM | POA: Diagnosis not present

## 2023-12-26 DIAGNOSIS — J4520 Mild intermittent asthma, uncomplicated: Secondary | ICD-10-CM | POA: Diagnosis not present

## 2023-12-26 DIAGNOSIS — Z87891 Personal history of nicotine dependence: Secondary | ICD-10-CM | POA: Diagnosis not present

## 2024-01-03 DIAGNOSIS — R059 Cough, unspecified: Secondary | ICD-10-CM | POA: Diagnosis not present

## 2024-01-03 DIAGNOSIS — Z79899 Other long term (current) drug therapy: Secondary | ICD-10-CM | POA: Diagnosis not present

## 2024-01-03 DIAGNOSIS — M1812 Unilateral primary osteoarthritis of first carpometacarpal joint, left hand: Secondary | ICD-10-CM | POA: Diagnosis not present

## 2024-01-03 DIAGNOSIS — M79641 Pain in right hand: Secondary | ICD-10-CM | POA: Diagnosis not present

## 2024-01-03 DIAGNOSIS — L4059 Other psoriatic arthropathy: Secondary | ICD-10-CM | POA: Diagnosis not present

## 2024-01-11 ENCOUNTER — Other Ambulatory Visit: Payer: Self-pay | Admitting: Podiatry

## 2024-01-11 DIAGNOSIS — B351 Tinea unguium: Secondary | ICD-10-CM

## 2024-01-16 DIAGNOSIS — R053 Chronic cough: Secondary | ICD-10-CM | POA: Diagnosis not present

## 2024-01-16 DIAGNOSIS — J383 Other diseases of vocal cords: Secondary | ICD-10-CM | POA: Diagnosis not present

## 2024-01-16 DIAGNOSIS — K219 Gastro-esophageal reflux disease without esophagitis: Secondary | ICD-10-CM | POA: Diagnosis not present

## 2024-01-22 ENCOUNTER — Encounter: Payer: Self-pay | Admitting: Podiatry

## 2024-01-22 ENCOUNTER — Ambulatory Visit: Admitting: Podiatry

## 2024-01-22 DIAGNOSIS — M79674 Pain in right toe(s): Secondary | ICD-10-CM

## 2024-01-22 DIAGNOSIS — Z79899 Other long term (current) drug therapy: Secondary | ICD-10-CM

## 2024-01-22 DIAGNOSIS — B351 Tinea unguium: Secondary | ICD-10-CM | POA: Diagnosis not present

## 2024-01-23 LAB — CBC
Hematocrit: 43.4 % (ref 34.0–46.6)
Hemoglobin: 14.5 g/dL (ref 11.1–15.9)
MCH: 30.2 pg (ref 26.6–33.0)
MCHC: 33.4 g/dL (ref 31.5–35.7)
MCV: 90 fL (ref 79–97)
Platelets: 260 10*3/uL (ref 150–450)
RBC: 4.8 x10E6/uL (ref 3.77–5.28)
RDW: 13.7 % (ref 11.7–15.4)
WBC: 5.5 10*3/uL (ref 3.4–10.8)

## 2024-01-23 LAB — HEPATIC FUNCTION PANEL
ALT: 21 IU/L (ref 0–32)
AST: 28 IU/L (ref 0–40)
Albumin: 4.3 g/dL (ref 3.8–4.8)
Alkaline Phosphatase: 114 IU/L (ref 44–121)
Bilirubin Total: 0.2 mg/dL (ref 0.0–1.2)
Bilirubin, Direct: <0.08 mg/dL (ref 0.00–0.40)
Total Protein: 6.6 g/dL (ref 6.0–8.5)

## 2024-01-24 NOTE — Progress Notes (Signed)
 Chief Complaint  Patient presents with   Follow-up    Nail fungus follow up, on the right first toe. There is some clear growth at the base of the nail. Not diabetic and no anti coag.     HPI: 73 y.o. female presents today for right hallux nail fungus. She has been taking terbinafine  for 3 months at this point. Has noticed some clearance of the toenail. Still has some dystrophic changes and fungal appearance distally.  Past Medical History:  Diagnosis Date   Achilles tendonosis 10/06/2020   Acute cystitis with hematuria 09/29/2019   Acute infection of nasal sinus 06/21/2021   Allergic rhinoconjunctivitis    Anxiety    Asthma    Cardiac murmur 05/04/2022   Cervical os stenosis 04/09/2015   Chest pain    Chest pain of uncertain etiology 05/04/2022   Chronic back pain 09/23/2021   Chronic knee pain after total replacement of right knee joint 07/21/2020   Chronic right shoulder pain 05/25/2021   Depression    Depression, major, recurrent, mild (HCC)    Diverticulitis    Dysuria 07/12/2021   GERD (gastroesophageal reflux disease)    Glaucoma    Heart murmur    Hypercholesterolemia    Hypothyroidism    Ingrown nail 02/04/2021   Insomnia    Laryngopharyngeal reflux (LPR)    Memory changes    Migraine without aura and without status migrainosus, not intractable 05/07/2021   Migraines    Mild recurrent major depression (HCC) 07/12/2021   Mixed hyperlipidemia 12/22/2019   Muscle tension dysphonia 09/12/2018   Muscle wasting and atrophy, not elsewhere classified, left hand 12/01/2020   Nodule of finger of right hand 12/01/2020   Osteoarthritis    Osteopenia    Pain and swelling of right knee 12/25/2018   Postmenopausal atrophic vaginitis 04/09/2015   Prediabetes 12/22/2019   Psoriatic arthritis (HCC)    RBBB    RLS (restless legs syndrome)    Seasonal allergic rhinitis due to pollen 06/21/2021   Secondary hypothyroidism 12/22/2019   Skin cancer    Snoring  05/07/2021   Spondylolisthesis at L4-L5 level 04/27/2022   Status post total right knee replacement 12/29/2021   STD (sexually transmitted disease)    HSV II   Tendinopathy of right rotator cuff 05/25/2021   Tightness of heel cord, left 11/10/2020   Unilateral primary osteoarthritis, left knee 09/22/2022   Vocal fold atrophy 09/12/2018    Past Surgical History:  Procedure Laterality Date   BACK SURGERY  2024   fusion of L4 and L5   BELPHAROPTOSIS REPAIR     BREAST SURGERY Left    times 2   CARDIAC CATHETERIZATION  10/05/2015   COLONOSCOPY  06/03/2013   Moderate predominantly sigmoid diverticulosis. Small interal hemorroids   FOOT SURGERY Right    Removed bone spur   GANGLION CYST EXCISION Left    foot   HEMORRHOID SURGERY     SKIN CANCER EXCISION     TOTAL KNEE ARTHROPLASTY Right 07/21/2020   UPPER GI ENDOSCOPY  03/23/2016   Mild gastritis, gastric polyps bxbenign squamous mucosa with no abnormaility, fundic glad poly in setting of mild chron gastritis.    Allergies  Allergen Reactions   Codeine Anaphylaxis and Nausea Only   Ambien [Zolpidem Tartrate] Other (See Comments)    Memory loss per patient   Clonazepam      Other Reaction(s): Fatique, Mental Status Changes   Cyproheptadine  Hcl Other (See Comments)  Confusion   Dilaudid  Hasan.Gunther ]     hallucinations   Hydrocodone Other (See Comments)    anaphylaxis   Lisinopril Cough   Pramipexole  Nausea Only   Sulfa Antibiotics Other (See Comments)    hives   Tramadol Nausea And Vomiting    ROS denies any nausea, vomiting, fever, chills, chest pain, shortness of breath   Physical Exam: There were no vitals filed for this visit.  General: The patient is alert and oriented x3 in no acute distress.  Dermatology: Skin is warm, dry and supple bilateral lower extremities. Interspaces are clear of maceration and debris.  Right hallux nail plate is thickened, elongated, dystrophic with subungual debris to the distal  aspect.  Fungal in appearance.  Pain with direct dorsal palpation. There is about 30% proximal clearance of the nail plate before nail debridement performed.     Vascular: Palpable pedal pulses bilaterally. Capillary refill within normal limits.  No appreciable edema.  No erythema or calor.  Neurological: Light touch sensation grossly intact bilateral feet.   Musculoskeletal Exam: No pedal deformities noted  Assessment/Plan of Care: 1. Pain due to onychomycosis of toenail of right foot   2. Encounter for long-term (current) use of high-risk medication      No orders of the defined types were placed in this encounter.  None  Discussed clinical findings with patient today.  # Onychomycosis right first toenail - Has had about 30% nail clearance - Patient has tolerated the medication well to this point.  We will continue with 2nd cycle following new LFT and CBC. - She would benefit from further medication due to the extent of fungal nail remaining. - Refill was sent in following request last week prior to her appointment.   Larissa Pegg L. Lunda Salines, AACFAS [facility]     [address] [place], [place] [address]                Office [phone]  Fax [phone]

## 2024-01-25 ENCOUNTER — Telehealth: Payer: Self-pay | Admitting: Podiatry

## 2024-01-25 NOTE — Telephone Encounter (Signed)
 Patient states insurance will not cover Terbinafine  rx- only allowed 84 pills every 6 mo's and she has reached that as of now.

## 2024-02-02 DIAGNOSIS — N39490 Overflow incontinence: Secondary | ICD-10-CM | POA: Diagnosis not present

## 2024-02-02 DIAGNOSIS — K5900 Constipation, unspecified: Secondary | ICD-10-CM | POA: Diagnosis not present

## 2024-02-08 DIAGNOSIS — G4733 Obstructive sleep apnea (adult) (pediatric): Secondary | ICD-10-CM | POA: Diagnosis not present

## 2024-02-13 ENCOUNTER — Ambulatory Visit: Admitting: Gastroenterology

## 2024-02-13 ENCOUNTER — Telehealth: Payer: Self-pay

## 2024-02-13 ENCOUNTER — Encounter: Payer: Self-pay | Admitting: Gastroenterology

## 2024-02-13 ENCOUNTER — Telehealth: Payer: Self-pay | Admitting: *Deleted

## 2024-02-13 DIAGNOSIS — Z0000 Encounter for general adult medical examination without abnormal findings: Secondary | ICD-10-CM | POA: Diagnosis not present

## 2024-02-13 DIAGNOSIS — M8588 Other specified disorders of bone density and structure, other site: Secondary | ICD-10-CM | POA: Diagnosis not present

## 2024-02-13 DIAGNOSIS — J309 Allergic rhinitis, unspecified: Secondary | ICD-10-CM | POA: Diagnosis not present

## 2024-02-13 DIAGNOSIS — E7800 Pure hypercholesterolemia, unspecified: Secondary | ICD-10-CM | POA: Diagnosis not present

## 2024-02-13 DIAGNOSIS — R159 Full incontinence of feces: Secondary | ICD-10-CM | POA: Diagnosis not present

## 2024-02-13 DIAGNOSIS — K5900 Constipation, unspecified: Secondary | ICD-10-CM

## 2024-02-13 DIAGNOSIS — J45909 Unspecified asthma, uncomplicated: Secondary | ICD-10-CM | POA: Diagnosis not present

## 2024-02-13 DIAGNOSIS — K219 Gastro-esophageal reflux disease without esophagitis: Secondary | ICD-10-CM | POA: Diagnosis not present

## 2024-02-13 DIAGNOSIS — R194 Change in bowel habit: Secondary | ICD-10-CM

## 2024-02-13 DIAGNOSIS — J4530 Mild persistent asthma, uncomplicated: Secondary | ICD-10-CM | POA: Diagnosis not present

## 2024-02-13 DIAGNOSIS — E039 Hypothyroidism, unspecified: Secondary | ICD-10-CM | POA: Diagnosis not present

## 2024-02-13 DIAGNOSIS — F330 Major depressive disorder, recurrent, mild: Secondary | ICD-10-CM | POA: Diagnosis not present

## 2024-02-13 DIAGNOSIS — L4050 Arthropathic psoriasis, unspecified: Secondary | ICD-10-CM | POA: Diagnosis not present

## 2024-02-13 DIAGNOSIS — G2581 Restless legs syndrome: Secondary | ICD-10-CM | POA: Diagnosis not present

## 2024-02-13 MED ORDER — NA SULFATE-K SULFATE-MG SULF 17.5-3.13-1.6 GM/177ML PO SOLN: 1.0000 | ORAL | 0 refills | Status: DC

## 2024-02-13 NOTE — Telephone Encounter (Signed)
 Pt has been scheduled tele preop appt 03/27/24. Med rec and consent are done.     Patient Consent for Virtual Visit        Olivia Cruz has provided verbal consent on 02/13/2024 for a virtual visit (video or telephone).   CONSENT FOR VIRTUAL VISIT FOR:  Olivia Cruz  By participating in this virtual visit I agree to the following:  I hereby voluntarily request, consent and authorize [facility] and its employed or contracted physicians, physician assistants, nurse practitioners or other licensed health care professionals (the Practitioner), to provide me with telemedicine health care services (the "Services) as deemed necessary by the treating Practitioner. I acknowledge and consent to receive the Services by the Practitioner via telemedicine. I understand that the telemedicine visit will involve communicating with the Practitioner through live audiovisual communication technology and the disclosure of certain medical information by electronic transmission. I acknowledge that I have been given the opportunity to request an in-person assessment or other available alternative prior to the telemedicine visit and am voluntarily participating in the telemedicine visit.  I understand that I have the right to withhold or withdraw my consent to the use of telemedicine in the course of my care at any time, without affecting my right to future care or treatment, and that the Practitioner or I may terminate the telemedicine visit at any time. I understand that I have the right to inspect all information obtained and/or recorded in the course of the telemedicine visit and may receive copies of available information for a reasonable fee.  I understand that some of the potential risks of receiving the Services via telemedicine include:  Delay or interruption in medical evaluation due to technological equipment failure or disruption; Information transmitted may not be sufficient (e.g. poor  resolution of images) to allow for appropriate medical decision making by the Practitioner; and/or  In rare instances, security protocols could fail, causing a breach of personal health information.  Furthermore, I acknowledge that it is my responsibility to provide information about my medical history, conditions and care that is complete and accurate to the best of my ability. I acknowledge that Practitioner's advice, recommendations, and/or decision may be based on factors not within their control, such as incomplete or inaccurate data provided by me or distortions of diagnostic images or specimens that may result from electronic transmissions. I understand that the practice of medicine is not an exact science and that Practitioner makes no warranties or guarantees regarding treatment outcomes. I acknowledge that a copy of this consent can be made available to me via my patient portal [facility] MyChart), or I can request a printed copy by calling the office of [facility].    I understand that my insurance will be billed for this visit.   I have read or had this consent read to me. I understand the contents of this consent, which adequately explains the benefits and risks of the Services being provided via telemedicine.  I have been provided ample opportunity to ask questions regarding this consent and the Services and have had my questions answered to my satisfaction. I give my informed consent for the services to be provided through the use of telemedicine in my medical care

## 2024-02-13 NOTE — Progress Notes (Signed)
 Olivia Cruz [id number] [birth year]   Chief Complaint: Bowel incontinence  Referring Provider: Sun, Vyvyan, MD Primary GI MD: Dr. Charlanne  HPI: Olivia Cruz is a 73 y.o. female with past medical history of asthma, right knee replacement, chronic back pain, anxiety/depression, diverticulitis, GERD, hypothyroidism, migraines, HLD, heart murmur who presents today for a complaint of bowel incontinence.    Patient last seen in office 09/18/2023 by Olivia Blower, PA-C for complaint of acute on chronic constipation, referred by PCP.  Had been seen at [facility] ED 07/17/2023 due to concern for bowel obstruction.  Workup was negative and she was discharged with Bentyl  and MiraLAX .  At last office visit patient reported history of hard stools and stool burden.  Recent episodes of severe abdominal cramping and worsening constipation with occasional episodes of suspected overflow diarrhea.  She had been on MiraLAX  every 3 days and Bentyl  as needed for cramping.  Thought was that her chronic constipation has worsened over the last few months due to becoming more sedentary s/p knee replacement, as well as poor water intake. She was advised to increase MiraLAX  to daily use, and increase water intake. Repeat colonoscopy due to change in bowel habits was discussed.  With recent preop abnormal EKG and cardiac workup, recommended holding off until cardiac workup complete. Patient did have a normal colonoscopy in 2019 with recommended recall 2029.  Abdominal x-ray 09/18/2023 showed moderate stool burden without bowel obstruction. TSH 09/18/2023 was normal at 1.9.  Patient called 11/01/2023 complaining of severe, explosive, watery diarrhea and associated lower abdominal cramping.  Noted some BRBPR in small amounts which patient attributed to frequent bathroom trips.  Estimated 20 bowel movements daily, every 20 to 30 minutes.  She had recently had cataract surgery and used optic antibiotics, but  no other antibiotic exposure or sick contacts.  Abdominal x-ray 11/17/2023 showed small volume of formed stool in the colon with nonobstructive bowel gas pattern.  Seen by pulmonology, had unremarkable chest CT 12/25/2023, completed course of azithromycin  and prednisone and was feeling much better at follow-up 01/16/2024.  Discussed controlling reflux symptoms.  Planning to follow-up as needed.  Recently saw PCP Olivia Cruz) 02/02/2024, noted to have functional constipation with overflow incontinence. Was advised to take Miralax  daily and add stool softener, add fiber, increase water. If not improved, advised to go back to GI.    Patient states diarrhea she was having back in March resolved shortly after she called in with her symptoms.  States she spoke with Dr. Charlanne on the phone the next day and was feeling better without diarrhea.  She did not complete GI pathogen panel for this reason.  States she was doing pretty well until a few weeks ago, went to the bathroom and realized she had passed stool without knowing it.  Has been having intermittent incontinence.  She will intermittently feel constipated and pass small balls of stool without even knowing it.  Otherwise stools are soft and mushy, can be formed.  States she saw her PCP a couple weeks ago and was advised to take MiraLAX  daily and Benefiber daily for 1 week, as well as increased fruits and vegetables and drink plenty of water.  States that taking MiraLAX  daily made everything worse and she called back and was advised to stop MiraLAX .  Yesterday she had 4 bowel movements, some urgency with the last bowel movement.  On MiraLAX  she was having 7-8 loose stools a day.  Prior to MiraLAX  states she had a bowel movement  once a day.  Reports that she has seen pelvic floor physical therapy in the past for urinary incontinence.  Has not had evaluation regarding fecal incontinence.  States she had a fall last week, felt dizzy and has been having some low  back pain since then.  Denies hitting her head.  Has discussed this with PCP and states she is going to be getting an x-ray.  She had previously been advised to use a cane, but had not been using for the last couple months.  She does have a cane with her today in the office and has started using this again.  She has followed up with cardiology and states that she had a negative workup and has been given clearance for left knee surgery.  Recent coronary CTA with no evidence of CAD.  She is on Pepcid  and Protonix  for GERD.  Denies any acid reflux or heartburn, but has had a chronic cough and has been cleared from a pulmonology standpoint.  Her pulmonologist increased her PPI to twice daily dosing with thought that her cough was due to GERD.  She has also made lifestyle modifications which have been helping.   Previous GI Procedures/Imaging   Colonoscopy 12/07/2017 - Moderate to severe sigmoid diverticulosis.  - Small internal hemorrhoids  - Otherwise normal colonoscopy to terminal ileum.  EGD 03/23/2016 - Mild gastritis - Incidental gastric polyps (status post polypectomy x 3) - Otherwise normal EGD Path: Esophagus, biopsy Benign squamous mucosa with no histopathologic abnormality Negative for eosinophilic esophagitis, dysplasia, or malignancy Stomach, polyps, gastric Fundic gland polyps in the setting of mild chronic gastritis A Warthin-Starry stain is negative for H. pylori Negative for intestinal metaplasia or malignancy  Past Medical History:  Diagnosis Date   Achilles tendonosis 10/06/2020   Acute cystitis with hematuria 09/29/2019   Acute infection of nasal sinus 06/21/2021   Allergic rhinoconjunctivitis    Anxiety    Asthma    Cardiac murmur 05/04/2022   Cervical os stenosis 04/09/2015   Chest pain    Chest pain of uncertain etiology 05/04/2022   Chronic back pain 09/23/2021   Chronic knee pain after total replacement of right knee joint 07/21/2020   Chronic right shoulder  pain 05/25/2021   Depression    Depression, major, recurrent, mild (HCC)    Diverticulitis    Dysuria 07/12/2021   GERD (gastroesophageal reflux disease)    Glaucoma    Heart murmur    Hypercholesterolemia    Hypothyroidism    Ingrown nail 02/04/2021   Insomnia    Laryngopharyngeal reflux (LPR)    Memory changes    Migraine without aura and without status migrainosus, not intractable 05/07/2021   Migraines    Mild recurrent major depression (HCC) 07/12/2021   Mixed hyperlipidemia 12/22/2019   Muscle tension dysphonia 09/12/2018   Muscle wasting and atrophy, not elsewhere classified, left hand 12/01/2020   Nodule of finger of right hand 12/01/2020   Osteoarthritis    Osteopenia    Pain and swelling of right knee 12/25/2018   Postmenopausal atrophic vaginitis 04/09/2015   Prediabetes 12/22/2019   Psoriatic arthritis (HCC)    RBBB    RLS (restless legs syndrome)    Seasonal allergic rhinitis due to pollen 06/21/2021   Secondary hypothyroidism 12/22/2019   Skin cancer    Snoring 05/07/2021   Spondylolisthesis at L4-L5 level 04/27/2022   Status post total right knee replacement 12/29/2021   STD (sexually transmitted disease)    HSV II   Tendinopathy of  right rotator cuff 05/25/2021   Tightness of heel cord, left 11/10/2020   Unilateral primary osteoarthritis, left knee 09/22/2022   Vocal fold atrophy 09/12/2018    Past Surgical History:  Procedure Laterality Date   BACK SURGERY  2024   fusion of L4 and L5   BELPHAROPTOSIS REPAIR     BREAST SURGERY Left    times 2   CARDIAC CATHETERIZATION  10/05/2015   COLONOSCOPY  06/03/2013   Moderate predominantly sigmoid diverticulosis. Small interal hemorroids   FOOT SURGERY Right    Removed bone spur   GANGLION CYST EXCISION Left    foot   HEMORRHOID SURGERY     SKIN CANCER EXCISION     TOTAL KNEE ARTHROPLASTY Right 07/21/2020   UPPER GI ENDOSCOPY  03/23/2016   Mild gastritis, gastric polyps bxbenign squamous mucosa with  no abnormaility, fundic glad poly in setting of mild chron gastritis.    Current Outpatient Medications  Medication Sig Dispense Refill   acetaminophen  (TYLENOL ) 160 MG/5ML liquid Take by mouth every 4 (four) hours as needed for fever.     Adalimumab (HUMIRA PEN) 40 MG/0.4ML PNKT Inject 40 mg into the skin once a week. Unsure if dose is 40 mg- every other week     ascorbic acid (VITAMIN C) 500 MG tablet Take 500 mg by mouth daily.     beclomethasone (QVAR  REDIHALER) 40 MCG/ACT inhaler INHALE 2 INHALATIONS BY MOUTH 1  TO 2 TIMES DAILY TO PREVENT  COUGH OR WHEEZE. RINSE, GARGLE,  AND SPIT AFTER USE 42.4 g 0   Biotin w/ Vitamins C & E (HAIR/SKIN/NAILS PO) Take 1 tablet by mouth daily.     budesonide  (RHINOCORT  AQUA) 32 MCG/ACT nasal spray Place 1 spray into both nostrils daily. Reported on 02/04/2016 8.43 mL 3   CALCIUM  MAGNESIUM  ZINC PO Take by mouth.     Calcium -Vitamin D -Vitamin K [id number] MG-UNT-MCG TABS Take 1 tablet by mouth daily.     cetirizine  (ZYRTEC ) 10 MG tablet Take 1 tablet (10 mg total) by mouth daily as needed for allergies. 90 tablet 1   clindamycin (CLEOCIN) 300 MG capsule Take 300 mg by mouth as needed. AS NEEDED FOR DENTAL PROCEDURE     diclofenac  Sodium (VOLTAREN ) 1 % GEL Apply 1 Application topically as needed for pain (Knee pain).     dicyclomine  (BENTYL ) 20 MG tablet Take 20 mg by mouth every 6 (six) hours as needed for spasms.     famotidine  (PEPCID ) 40 MG tablet TAKE 1 TABLET BY MOUTH DAILY AT  BEDTIME 100 tablet 2   levothyroxine  (SYNTHROID ) 50 MCG tablet TAKE 1 TABLET BY MOUTH DAILY 90 tablet 3   methocarbamol  (ROBAXIN ) 500 MG tablet Take 1 tablet (500 mg total) by mouth every 6 (six) hours as needed for muscle spasms. 30 tablet 3   Multiple Vitamin (MULTIVITAMIN WITH MINERALS) TABS tablet Take 1 tablet by mouth daily. Woman 50+     nitrofurantoin  (MACRODANTIN ) 100 MG capsule Take 100 mg by mouth as needed (after  intercourse).     nitroGLYCERIN  (NITROSTAT ) 0.4 MG SL  tablet Place 1 tablet (0.4 mg total) under the tongue every 5 (five) minutes as needed for chest pain. 25 tablet 6   pantoprazole  (PROTONIX ) 40 MG tablet Take 1 tablet 1-2 times per day 180 tablet 1   Propylene Glycol, PF, (SYSTANE COMPLETE PF) 0.6 % SOLN Place 1 drop into both eyes daily as needed (dry eyes).     rosuvastatin  (CRESTOR ) 20 MG tablet Take 1 tablet (  20 mg total) by mouth every Monday, Wednesday, and Friday. 36 tablet 3   terbinafine  (LAMISIL ) 250 MG tablet TAKE 1 TABLET BY MOUTH EVERY DAY 42 tablet 2   valACYclovir  (VALTREX ) 500 MG tablet Take 1 tablet (500 mg total) by mouth 3 (three) times daily as needed (herpes outbreak). 21 tablet 0   No current facility-administered medications for this visit.    Allergies as of 02/13/2024 - Review Complete 01/22/2024  Allergen Reaction Noted   Codeine Anaphylaxis and Nausea Only 02/14/2013   Ambien [zolpidem tartrate] Other (See Comments) 09/05/2016   Clonazepam   01/05/2021   Cyproheptadine  hcl Other (See Comments) 09/26/2019   Dilaudid  [hydromorphone ]  09/18/2017   Hydrocodone Other (See Comments) 09/28/2015   Lisinopril Cough 09/26/2019   Pramipexole  Nausea Only 05/07/2021   Sulfa antibiotics Other (See Comments) 02/14/2013   Tramadol Nausea And Vomiting 09/28/2015    Family History  Problem Relation Age of Onset   Breast cancer Sister        lung   Cancer Maternal Grandmother    Multiple births Maternal Grandmother    Multiple births Paternal Grandmother    Cancer Paternal Grandmother    Thyroid  disease Mother    Heart failure Mother    Heart disease Mother    Hyperlipidemia Mother    Hypertension Mother    Stroke Mother    Depression Mother    Parkinson's disease Father    Glaucoma Father    Diverticulitis Father     Social History   Tobacco Use   Smoking status: Former    Current packs/day: 0.00    Types: Cigarettes    Quit date: 1986    Years since quitting: 39.5   Smokeless tobacco: Never  Vaping Use    Vaping status: Never Used  Substance Use Topics   Alcohol  use: No    Alcohol /week: 0.0 standard drinks of alcohol    Drug use: No     Review of Systems:    Constitutional: No fever, chills, weakness or fatigue Eyes: No change in vision Ears, Nose, Throat:  No change in hearing or congestion Skin: No rash or itching Cardiovascular: No chest pain, chest pressure or palpitations   Respiratory: No SOB  Gastrointestinal: See HPI and otherwise negative Genitourinary: No dysuria or change in urinary frequency Neurological: No headache or syncope. Positive dizziness with fall last week, dizziness resolved. Musculoskeletal: New low back pain since fall Hematologic: No bleeding    Physical Exam:  Vital signs: BP 116/78   Pulse 89   Ht 5' (1.524 m)   Wt 148 lb (67.1 kg)   LMP 08/22/2005   SpO2 96%   BMI 28.90 kg/m    Wt Readings from Last 3 Encounters:  02/13/24 148 lb (67.1 kg)  09/18/23 161 lb (73 kg)  09/11/23 162 lb (73.5 kg)     Constitutional: NAD, Well developed, Well nourished, alert and cooperative Head:  Normocephalic and atraumatic.  Eyes: No scleral icterus. Conjunctiva pink. Mouth: No oral lesions. Respiratory: Respirations even and unlabored. Lungs clear to auscultation bilaterally.  No wheezes, crackles, or rhonchi.  Cardiovascular:  Regular rate and rhythm. No murmurs. No peripheral edema. Gastrointestinal:  Soft, nondistended, nontender. No rebound or guarding. Normal bowel sounds. No appreciable masses or hepatomegaly. Rectal:  Deferred to colonoscopy Neurologic:  Alert and oriented x4;  grossly normal neurologically.  Skin:   Dry and intact without significant lesions or rashes. Psychiatric: Oriented to person, place and time. Demonstrates good judgement and reason without abnormal affect  or behaviors.   RELEVANT LABS AND IMAGING: CBC    Component Value Date/Time   WBC 5.5 01/22/2024 1642   WBC 13.5 (H) 01/31/2023 0742   RBC 4.80 01/22/2024 1642    RBC 3.83 (L) 01/31/2023 0742   HGB 14.5 01/22/2024 1642   HGB 14.2 02/19/2014 1453   HCT 43.4 01/22/2024 1642   PLT 260 01/22/2024 1642   MCV 90 01/22/2024 1642   MCH 30.2 01/22/2024 1642   MCH 29.8 01/31/2023 0742   MCHC 33.4 01/22/2024 1642   MCHC 33.3 01/31/2023 0742   RDW 13.7 01/22/2024 1642   LYMPHSABS 2.3 09/11/2023 1429   EOSABS 0.2 09/11/2023 1429   BASOSABS 0.0 09/11/2023 1429    CMP     Component Value Date/Time   NA 143 09/11/2023 1429   K 4.2 09/11/2023 1429   CL 104 09/11/2023 1429   CO2 23 09/11/2023 1429   GLUCOSE 100 (H) 09/11/2023 1429   GLUCOSE 142 (H) 01/31/2023 0742   BUN 10 09/11/2023 1429   CREATININE 0.69 09/11/2023 1429   CREATININE 0.71 02/18/2013 1029   CALCIUM  9.7 09/11/2023 1429   PROT 6.6 01/22/2024 1642   ALBUMIN  4.3 01/22/2024 1642   AST 28 01/22/2024 1642   ALT 21 01/22/2024 1642   ALKPHOS 114 01/22/2024 1642   BILITOT 0.2 01/22/2024 1642   GFRNONAA >60 01/31/2023 0742   GFRAA 102 09/15/2020 1101     Assessment/Plan:   Change in bowel habits Constipation Fecal incontinence Patient with change in bowel habits since January.  Has had findings consistent with constipation and overflow diarrhea.  Possible viral illness in March which caused severe diarrhea and frequent bowel movements.  This has since resolved.  She does occasionally pass small balls of stool, otherwise stools are soft and mushy.  She denies any blood in her stool or melena.  Has had some fecal incontinence, and states that sometimes she will have passed stool and not realize it until she goes to the bathroom.  Symptoms worsen with MiraLAX  so she has stopped taking. Having 4 bowel movements a day.  States she used to have 1 bowel movement daily.   It was discussed at last visit that patient may need repeat colonoscopy based on this change in bowel habits.  Last colonoscopy in 2019 was normal.  - Schedule colonoscopy. I thoroughly discussed the procedure with the patient to  include nature of the procedure, alternatives, benefits, and risks (including but not limited to bleeding, infection, perforation, anesthesia/cardiac/pulmonary complications). Patient verbalized understanding and gave verbal consent to proceed with procedure.  - Request cardiac clearance. - Recommend daily fiber supplement - Consider referral to pelvic floor physical therapy for fecal incontinence  GERD Patient denies any acid reflux or heartburn on PPI and Pepcid , however she has had a chronic cough and was worked up by pulmonology with negative findings.  Cough thought to be due to allergies versus GERD.  They increased her PPI to twice daily.  She says she is doing well.  - Continue Protonix  40 mg twice daily - Continue Pepcid  40 mg daily - Lifestyle modifications for GERD   Camie Furbish, PA-C [facility] 02/13/2024, 8:28 AM  Patient Care Team: Cruz, Vyvyan, MD as PCP - General (Family Medicine) Revankar, Jennifer SAUNDERS, MD as PCP - Cardiology (Cardiology) Trudy Lynwood DASEN, MD as Referring Physician (Dermatology) Maree Korene PEDLAR, MD as Referring Physician (Rheumatology) Delene Rush, MD as Referring Physician (Orthopedic Surgery) Myrick Elspeth PARAS., DPM (Podiatry)

## 2024-02-13 NOTE — Telephone Encounter (Signed)
 [facility] Pre-operative Risk Assessment     Request for surgical clearance:     Endoscopy Procedure  What type of surgery is being performed?     Colonoscopy  When is this surgery scheduled?     04-11-24  What type of clearance is required ?   Cardiac  Practice name and name of physician performing surgery?      [facility]  What is your office phone and fax number?      Phone- [phone]  Fax- [phone]  Anesthesia type (None, local, MAC, general) ?       MAC   Please route your response to Nat SAUNDERS, CMA

## 2024-02-13 NOTE — Telephone Encounter (Signed)
 Pt has been scheduled tele preop appt 03/27/24. Med rec and consent are done.

## 2024-02-13 NOTE — Patient Instructions (Addendum)
 _______________________________________________________  If your blood pressure at your visit was 140/90 or greater, please contact your primary care physician to follow up on this.  _______________________________________________________  If you are age 74 or older, your body mass index should be between 23-30. Your Body mass index is 28.9 kg/m. If this is out of the aforementioned range listed, please consider follow up with your Primary Care Provider.  If you are age 87 or younger, your body mass index should be between 19-25. Your Body mass index is 28.9 kg/m. If this is out of the aformentioned range listed, please consider follow up with your Primary Care Provider.  ________________________________________________________  The [facility] providers would like to encourage you to use MYCHART to communicate with providers for non-urgent requests or questions.  Due to long hold times on the telephone, sending your provider a message by [facility] may be a faster and more efficient way to get a response.  Please allow 48 business hours for a response.  Please remember that this is for non-urgent requests.  _______________________________________________________  CONTINUE: Fiber supplement  You have been scheduled for a colonoscopy. Please follow written instructions given to you at your visit today.   If you use inhalers (even only as needed), please bring them with you on the day of your procedure.  DO NOT TAKE 7 DAYS PRIOR TO TEST- Trulicity (dulaglutide) Ozempic, Wegovy (semaglutide) Mounjaro (tirzepatide) Bydureon Bcise (exanatide extended release)  DO NOT TAKE 1 DAY PRIOR TO YOUR TEST Rybelsus (semaglutide) Adlyxin (lixisenatide) Victoza (liraglutide) Byetta (exanatide) ___________________________________________________________________________  Due to recent changes in healthcare laws, you may see the results of your imaging and laboratory studies on MyChart before your  provider has had a chance to review them.  We understand that in some cases there may be results that are confusing or concerning to you. Not all laboratory results come back in the same time frame and the provider may be waiting for multiple results in order to interpret others.  Please give us  48 hours in order for your provider to thoroughly review all the results before contacting the office for clarification of your results.   Thank you for entrusting me with your care and choosing [facility].  Camie Furbish, PA-C

## 2024-02-13 NOTE — Telephone Encounter (Signed)
   Name: Olivia Cruz  DOB: Oct 14, 1950  MRN: [id number]  Primary Cardiologist: Jennifer JONELLE Crape, MD   Preoperative team, please contact this patient and set up a phone call appointment for further preoperative risk assessment. Please obtain consent and complete medication review. Thank you for your help.  I confirm that guidance regarding antiplatelet and oral anticoagulation therapy has been completed and, if necessary, noted below.  None requested   I also confirmed the patient resides in the state of [place] . As per [facility] Medical Board telemedicine laws, the patient must reside in the state in which the provider is licensed.   Josefa CHRISTELLA Beauvais, NP 02/13/2024, 12:26 PM [facility]

## 2024-02-14 DIAGNOSIS — M4316 Spondylolisthesis, lumbar region: Secondary | ICD-10-CM | POA: Diagnosis not present

## 2024-02-15 DIAGNOSIS — G4733 Obstructive sleep apnea (adult) (pediatric): Secondary | ICD-10-CM | POA: Diagnosis not present

## 2024-02-19 DIAGNOSIS — R053 Chronic cough: Secondary | ICD-10-CM | POA: Diagnosis not present

## 2024-02-19 DIAGNOSIS — W19XXXS Unspecified fall, sequela: Secondary | ICD-10-CM | POA: Diagnosis not present

## 2024-02-19 DIAGNOSIS — Z79899 Other long term (current) drug therapy: Secondary | ICD-10-CM | POA: Diagnosis not present

## 2024-02-19 DIAGNOSIS — L4059 Other psoriatic arthropathy: Secondary | ICD-10-CM | POA: Diagnosis not present

## 2024-02-19 DIAGNOSIS — R159 Full incontinence of feces: Secondary | ICD-10-CM | POA: Diagnosis not present

## 2024-02-20 DIAGNOSIS — E039 Hypothyroidism, unspecified: Secondary | ICD-10-CM | POA: Diagnosis not present

## 2024-02-20 DIAGNOSIS — E7800 Pure hypercholesterolemia, unspecified: Secondary | ICD-10-CM | POA: Diagnosis not present

## 2024-02-27 ENCOUNTER — Telehealth: Payer: Self-pay | Admitting: Gastroenterology

## 2024-02-27 DIAGNOSIS — R159 Full incontinence of feces: Secondary | ICD-10-CM

## 2024-02-27 DIAGNOSIS — R194 Change in bowel habit: Secondary | ICD-10-CM

## 2024-02-27 NOTE — Telephone Encounter (Signed)
 PT is calling to speak with a nurse regarding her symptoms. She has been having excessive BM 6-10 a day. She is scheduled for a colonoscopy on 8/21 and wants to know if it should be done earlier considering this must means there's an underlying issue. Please advise.

## 2024-02-27 NOTE — Telephone Encounter (Signed)
 Attempted to reach patient. No answer, phone rang and then gave busy signal. Unable to lvm.

## 2024-02-28 NOTE — Telephone Encounter (Signed)
 Attempted to reach patient. No answer, unable to leave vm. Phone rang and then went to busy signal.

## 2024-02-29 NOTE — Telephone Encounter (Signed)
 Patient returned call. She reports that she is having diarrhea episodes 6-7 times per day. She reports she is still having episodes of incontinence, but not as bad as when she saw provider in office. She has been off of the Miralax  x2 weeks and is still going frequently. Patient reports that the stool is mushy and there looks like there are some undigested food particles. She reports that she was taking her Pantoprazole  BID and was taking Famotidine  q day, she reported concern that she didn't have enough acid to digest her foods. Patient reports that her stools are floating in the toilet, and won't go down when I flush. Patient reports that she has not been taking her fiber supplements. Recommended patient restart fiber as this can help bulk stools.

## 2024-02-29 NOTE — Telephone Encounter (Signed)
 3rd attempt to reach patient. No answer, left vm for patient to return call. Will send my chart message.

## 2024-03-01 NOTE — Telephone Encounter (Signed)
 Attempted to reach patient to discuss recommendations. No answer. LVM for patient to return call.

## 2024-03-01 NOTE — Telephone Encounter (Signed)
 Patient returned call. Discussed recommendations. Patient in agreement. Orders placed. Patient instructed on everything. Verbalized understanding.

## 2024-03-04 DIAGNOSIS — Z85828 Personal history of other malignant neoplasm of skin: Secondary | ICD-10-CM | POA: Diagnosis not present

## 2024-03-04 DIAGNOSIS — L918 Other hypertrophic disorders of the skin: Secondary | ICD-10-CM | POA: Diagnosis not present

## 2024-03-04 DIAGNOSIS — D2262 Melanocytic nevi of left upper limb, including shoulder: Secondary | ICD-10-CM | POA: Diagnosis not present

## 2024-03-04 DIAGNOSIS — L570 Actinic keratosis: Secondary | ICD-10-CM | POA: Diagnosis not present

## 2024-03-04 DIAGNOSIS — D2261 Melanocytic nevi of right upper limb, including shoulder: Secondary | ICD-10-CM | POA: Diagnosis not present

## 2024-03-04 DIAGNOSIS — L821 Other seborrheic keratosis: Secondary | ICD-10-CM | POA: Diagnosis not present

## 2024-03-04 DIAGNOSIS — L720 Epidermal cyst: Secondary | ICD-10-CM | POA: Diagnosis not present

## 2024-03-04 DIAGNOSIS — L820 Inflamed seborrheic keratosis: Secondary | ICD-10-CM | POA: Diagnosis not present

## 2024-03-04 DIAGNOSIS — L603 Nail dystrophy: Secondary | ICD-10-CM | POA: Diagnosis not present

## 2024-03-04 DIAGNOSIS — D1801 Hemangioma of skin and subcutaneous tissue: Secondary | ICD-10-CM | POA: Diagnosis not present

## 2024-03-27 ENCOUNTER — Ambulatory Visit: Attending: Cardiology

## 2024-03-27 DIAGNOSIS — Z01810 Encounter for preprocedural cardiovascular examination: Secondary | ICD-10-CM | POA: Diagnosis not present

## 2024-03-27 NOTE — Progress Notes (Signed)
 Virtual Visit via Telephone Note   Because of Olivia Cruz co-morbid illnesses, she is at least at moderate risk for complications without adequate follow up.  This format is felt to be most appropriate for this patient at this time.  Due to technical limitations with video connection (technology), today's appointment will be conducted as an audio only telehealth visit, and [name] verbally agreed to proceed in this manner.   All issues noted in this document were discussed and addressed.  No physical exam could be performed with this format.  Evaluation Performed:  Preoperative cardiovascular risk assessment _____________   Date:  03/27/2024   Patient ID:  Olivia Cruz, DOB 1950/09/05, MRN [id number] Patient Location:  Home Provider location:   Office  Primary Care Provider:  Sun, Vyvyan, MD Primary Cardiologist:  Jennifer JONELLE Crape, MD  Chief Complaint / Patient Profile   73 y.o. y/o female with a h/o migraines, GERD, hypothyroidism, psoriatic arthritis, and dyslipidemia who is pending colonoscopy and presents today for telephonic preoperative cardiovascular risk assessment.  History of Present Illness    Olivia Cruz is a 73 y.o. female who presents via audio/video conferencing for a telehealth visit today.  Pt was last seen in cardiology clinic on 09/11/2023 by Delon Hoover, NP.  At that time DASHANA GUIZAR was doing okay but was having some chest pain.  Coronary CTA was ordered.  This test revealed a coronary calcium  score of 0 with no evidence of CAD.  The patient is now pending procedure as outlined above. Since her last visit, she feeling fine from a heart standpoint. She was seen originally for a knee replacement. No symptoms of chest pain. She was having some reflux symptoms. No SOB. She lost 14 lbs and her cholesterol is down with no need for medication.   No medications are required to hold.  Past Medical History    Past Medical History:   Diagnosis Date   Achilles tendonosis 10/06/2020   Acute cystitis with hematuria 09/29/2019   Acute infection of nasal sinus 06/21/2021   Allergic rhinoconjunctivitis    Anxiety    Asthma    Cardiac murmur 05/04/2022   Cervical os stenosis 04/09/2015   Chest pain    Chest pain of uncertain etiology 05/04/2022   Chronic back pain 09/23/2021   Chronic knee pain after total replacement of right knee joint 07/21/2020   Chronic right shoulder pain 05/25/2021   Depression    Depression, major, recurrent, mild (HCC)    Diverticulitis    Dysuria 07/12/2021   GERD (gastroesophageal reflux disease)    Glaucoma    Heart murmur    Hypercholesterolemia    Hypothyroidism    Ingrown nail 02/04/2021   Insomnia    Laryngopharyngeal reflux (LPR)    Memory changes    Migraine without aura and without status migrainosus, not intractable 05/07/2021   Migraines    Mild recurrent major depression (HCC) 07/12/2021   Mixed hyperlipidemia 12/22/2019   Muscle tension dysphonia 09/12/2018   Muscle wasting and atrophy, not elsewhere classified, left hand 12/01/2020   Nodule of finger of right hand 12/01/2020   Osteoarthritis    Osteopenia    Pain and swelling of right knee 12/25/2018   Postmenopausal atrophic vaginitis 04/09/2015   Prediabetes 12/22/2019   Psoriatic arthritis (HCC)    RBBB    RLS (restless legs syndrome)    Seasonal allergic rhinitis due to pollen 06/21/2021   Secondary hypothyroidism 12/22/2019   Skin cancer  Snoring 05/07/2021   Spondylolisthesis at L4-L5 level 04/27/2022   Status post total right knee replacement 12/29/2021   STD (sexually transmitted disease)    HSV II   Tendinopathy of right rotator cuff 05/25/2021   Tightness of heel cord, left 11/10/2020   Unilateral primary osteoarthritis, left knee 09/22/2022   Vocal fold atrophy 09/12/2018   Past Surgical History:  Procedure Laterality Date   BACK SURGERY  2024   fusion of L4 and L5   BELPHAROPTOSIS REPAIR      BREAST SURGERY Left    times 2   CARDIAC CATHETERIZATION  10/05/2015   COLONOSCOPY  06/03/2013   Moderate predominantly sigmoid diverticulosis. Small interal hemorroids   FOOT SURGERY Right    Removed bone spur   GANGLION CYST EXCISION Left    foot   HEMORRHOID SURGERY     SKIN CANCER EXCISION     TOTAL KNEE ARTHROPLASTY Right 07/21/2020   UPPER GI ENDOSCOPY  03/23/2016   Mild gastritis, gastric polyps bxbenign squamous mucosa with no abnormaility, fundic glad poly in setting of mild chron gastritis.    Allergies  Allergies  Allergen Reactions   Codeine Anaphylaxis and Nausea Only   Ambien [Zolpidem Tartrate] Other (See Comments)    Memory loss per patient   Clonazepam      Other Reaction(s): Fatique, Mental Status Changes   Cyproheptadine  Hcl Other (See Comments)    Confusion   Dilaudid  [Hydromorphone ]     hallucinations   Hydrocodone Other (See Comments)    anaphylaxis   Lisinopril Cough   Pramipexole  Nausea Only   Sulfa Antibiotics Other (See Comments)    hives   Tramadol Nausea And Vomiting    Home Medications    Prior to Admission medications   Medication Sig Start Date End Date Taking? Authorizing Provider  Adalimumab (HUMIRA PEN) 40 MG/0.4ML PNKT Inject 40 mg into the skin once a week. Unsure if dose is 40 mg- every other week    [provider]  ascorbic acid (VITAMIN C) 500 MG tablet Take 500 mg by mouth daily.    [provider]  beclomethasone (QVAR  REDIHALER) 40 MCG/ACT inhaler INHALE 2 INHALATIONS BY MOUTH 1  TO 2 TIMES DAILY TO PREVENT  COUGH OR WHEEZE. RINSE, GARGLE,  AND SPIT AFTER USE 08/07/23   Kozlow, Camellia PARAS, MD  Biotin w/ Vitamins C & E (HAIR/SKIN/NAILS PO) Take 1 tablet by mouth daily.    [provider]  budesonide  (RHINOCORT  AQUA) 32 MCG/ACT nasal spray Place 1 spray into both nostrils daily. Reported on 02/04/2016 06/21/21   Sherre Clapper, MD  CALCIUM  MAGNESIUM  ZINC PO Take by mouth.    [provider]   cetirizine  (ZYRTEC ) 10 MG tablet Take 1 tablet (10 mg total) by mouth daily as needed for allergies. 05/25/23   Kozlow, Camellia PARAS, MD  clindamycin (CLEOCIN) 300 MG capsule Take 300 mg by mouth as needed. AS NEEDED FOR DENTAL PROCEDURE    [provider]  diclofenac  Sodium (VOLTAREN ) 1 % GEL Apply 1 Application topically as needed for pain (Knee pain). 10/06/20   [provider]  DULoxetine  (CYMBALTA ) 30 MG capsule 1 capsule 2 (two) times daily. 07/31/23   [provider]  famotidine  (PEPCID ) 40 MG tablet TAKE 1 TABLET BY MOUTH DAILY AT  BEDTIME 10/11/23   Kozlow, Camellia PARAS, MD  levothyroxine  (SYNTHROID ) 50 MCG tablet TAKE 1 TABLET BY MOUTH DAILY 10/18/21   Cox, Clapper, MD  methocarbamol  (ROBAXIN ) 500 MG tablet Take 1 tablet (500  mg total) by mouth every 6 (six) hours as needed for muscle spasms. 01/31/23   Colon Shove, MD  Multiple Vitamin (MULTIVITAMIN WITH MINERALS) TABS tablet Take 1 tablet by mouth daily. Woman 50+    [provider]  Na Sulfate-K Sulfate-Mg Sulfate concentrate (SUPREP BOWEL PREP KIT) 17.5-3.13-1.6 GM/177ML SOLN Take 1 kit (354 mLs total) by mouth as directed. 02/13/24   Heinz, Camie BRAVO, PA-C  nitrofurantoin  (MACRODANTIN ) 100 MG capsule Take 100 mg by mouth as needed (after  intercourse). 09/12/18   [provider]  nitroGLYCERIN  (NITROSTAT ) 0.4 MG SL tablet Place 1 tablet (0.4 mg total) under the tongue every 5 (five) minutes as needed for chest pain. 05/04/22   Revankar, Jennifer SAUNDERS, MD  pantoprazole  (PROTONIX ) 40 MG tablet Take 1 tablet 1-2 times per day 05/25/23   Kozlow, Eric J, MD  Propylene Glycol, PF, (SYSTANE COMPLETE PF) 0.6 % SOLN Place 1 drop into both eyes daily as needed (dry eyes).    [provider]  terbinafine  (LAMISIL ) 250 MG tablet TAKE 1 TABLET BY MOUTH EVERY DAY 01/16/24   Lamount Ethan CROME, DPM  valACYclovir  (VALTREX ) 500 MG tablet Take 1 tablet (500 mg total) by mouth 3 (three) times daily as needed (herpes outbreak). 07/12/21    Sherre Clapper, MD    Physical Exam    Vital Signs:  Randie NOVAK Blaszczyk does not have vital signs available for review today.  Given telephonic nature of communication, physical exam is limited. AAOx3. NAD. Normal affect.  Speech and respirations are unlabored.  Accessory Clinical Findings    None  Assessment & Plan    1.  Preoperative Cardiovascular Risk Assessment:  Ms. Chou's perioperative risk of a major cardiac event is 0.4% according to the Revised Cardiac Risk Index (RCRI).  Therefore, she is at low risk for perioperative complications.   Her functional capacity is good at 6.55 METs according to the [employer] Activity Status Index (DASI). Recommendations: According to ACC/AHA guidelines, no further cardiovascular testing needed.  The patient may proceed to surgery at acceptable risk.     The patient was advised that if she develops new symptoms prior to surgery to contact our office to arrange for a follow-up visit, and she verbalized understanding.   A copy of this note will be routed to requesting surgeon.  Time:   Today, I have spent 15 minutes with the patient with telehealth technology discussing medical history, symptoms, and management plan.     Orren LOISE Fabry, PA-C  03/27/2024, 1:52 PM

## 2024-03-28 NOTE — Telephone Encounter (Signed)
 Left message on voicemail that per televisit with cardiology patient has been cleared to proceed with colonoscopy scheduled for 04-11-24 with Dr Charlanne.  Advised for patient to return call to our office with any further questions or concerns.

## 2024-04-03 ENCOUNTER — Encounter: Payer: Self-pay | Admitting: Gastroenterology

## 2024-04-04 ENCOUNTER — Ambulatory Visit: Attending: Family Medicine

## 2024-04-04 VITALS — BP 147/94 | HR 75

## 2024-04-04 DIAGNOSIS — R2681 Unsteadiness on feet: Secondary | ICD-10-CM | POA: Diagnosis present

## 2024-04-04 DIAGNOSIS — R262 Difficulty in walking, not elsewhere classified: Secondary | ICD-10-CM | POA: Diagnosis present

## 2024-04-04 DIAGNOSIS — M6281 Muscle weakness (generalized): Secondary | ICD-10-CM | POA: Diagnosis present

## 2024-04-04 NOTE — Therapy (Signed)
 OUTPATIENT PHYSICAL THERAPY NEURO EVALUATION   Patient Name: Olivia Cruz MRN: [id number] DOB:[birth year], 73 y.o., female Today's Date: 04/04/2024   PCP: Vyvyan Sun, MD REFERRING PROVIDER: Arnett Repress, MD  END OF SESSION:  PT End of Session - 04/04/24 0845     Visit Number 1    Number of Visits 9    Date for PT Re-Evaluation 05/31/24   to allow for travel out of town   Authorization Type HTA    PT Start Time [phone]    PT Stop Time 0927    PT Time Calculation (min) 41 min    Equipment Utilized During Treatment Gait belt    Activity Tolerance Patient tolerated treatment well    Behavior During Therapy [facility] for tasks assessed/performed          Past Medical History:  Diagnosis Date   Achilles tendonosis 10/06/2020   Acute cystitis with hematuria 09/29/2019   Acute infection of nasal sinus 06/21/2021   Allergic rhinoconjunctivitis    Anxiety    Asthma    Cardiac murmur 05/04/2022   Cervical os stenosis 04/09/2015   Chest pain    Chest pain of uncertain etiology 05/04/2022   Chronic back pain 09/23/2021   Chronic knee pain after total replacement of right knee joint 07/21/2020   Chronic right shoulder pain 05/25/2021   Depression    Depression, major, recurrent, mild (HCC)    Diverticulitis    Dysuria 07/12/2021   GERD (gastroesophageal reflux disease)    Glaucoma    Heart murmur    Hypercholesterolemia    Hypothyroidism    Ingrown nail 02/04/2021   Insomnia    Laryngopharyngeal reflux (LPR)    Memory changes    Migraine without aura and without status migrainosus, not intractable 05/07/2021   Migraines    Mild recurrent major depression (HCC) 07/12/2021   Mixed hyperlipidemia 12/22/2019   Muscle tension dysphonia 09/12/2018   Muscle wasting and atrophy, not elsewhere classified, left hand 12/01/2020   Nodule of finger of right hand 12/01/2020   Osteoarthritis    Osteopenia    Pain and swelling of right knee 12/25/2018   Postmenopausal atrophic  vaginitis 04/09/2015   Prediabetes 12/22/2019   Psoriatic arthritis (HCC)    RBBB    RLS (restless legs syndrome)    Seasonal allergic rhinitis due to pollen 06/21/2021   Secondary hypothyroidism 12/22/2019   Skin cancer    Snoring 05/07/2021   Spondylolisthesis at L4-L5 level 04/27/2022   Status post total right knee replacement 12/29/2021   STD (sexually transmitted disease)    HSV II   Tendinopathy of right rotator cuff 05/25/2021   Tightness of heel cord, left 11/10/2020   Unilateral primary osteoarthritis, left knee 09/22/2022   Vocal fold atrophy 09/12/2018   Past Surgical History:  Procedure Laterality Date   BACK SURGERY  2024   fusion of L4 and L5   BELPHAROPTOSIS REPAIR     BREAST SURGERY Left    times 2   CARDIAC CATHETERIZATION  10/05/2015   COLONOSCOPY  06/03/2013   Moderate predominantly sigmoid diverticulosis. Small interal hemorroids   FOOT SURGERY Right    Removed bone spur   GANGLION CYST EXCISION Left    foot   HEMORRHOID SURGERY     SKIN CANCER EXCISION     TOTAL KNEE ARTHROPLASTY Right 07/21/2020   UPPER GI ENDOSCOPY  03/23/2016   Mild gastritis, gastric polyps bxbenign squamous mucosa with no abnormaility, fundic glad poly in setting of mild chron  gastritis.   Patient Active Problem List   Diagnosis Date Noted   Heart murmur    Unilateral primary osteoarthritis, left knee 09/22/2022   Chest pain of uncertain etiology 05/04/2022   Cardiac murmur 05/04/2022   Allergic rhinoconjunctivitis 04/27/2022   Anxiety 04/27/2022   Asthma 04/27/2022   Chest pain 04/27/2022   Depression 04/27/2022   Depression, major, recurrent, mild (HCC) 04/27/2022   Diverticulitis 04/27/2022   GERD (gastroesophageal reflux disease) 04/27/2022   Glaucoma 04/27/2022   Hypercholesterolemia 04/27/2022   Hypothyroidism 04/27/2022   Insomnia 04/27/2022   Memory changes 04/27/2022   Migraines 04/27/2022   Osteoarthritis 04/27/2022   Osteopenia 04/27/2022   Skin cancer  04/27/2022   STD (sexually transmitted disease) 04/27/2022   Spondylolisthesis at L4-L5 level 04/27/2022   Status post total right knee replacement 12/29/2021   Mild recurrent major depression (HCC) 07/12/2021   Dysuria 07/12/2021   Acute infection of nasal sinus 06/21/2021   Seasonal allergic rhinitis due to pollen 06/21/2021   Chronic right shoulder pain 05/25/2021   Tendinopathy of right rotator cuff 05/25/2021   Migraine without aura and without status migrainosus, not intractable 05/07/2021   Snoring 05/07/2021   Ingrown nail 02/04/2021   Muscle wasting and atrophy, not elsewhere classified, left hand 12/01/2020   Nodule of finger of right hand 12/01/2020   Tightness of heel cord, left 11/10/2020   Achilles tendonosis 10/06/2020   Chronic knee pain after total replacement of right knee joint 07/21/2020   Secondary hypothyroidism 12/22/2019   Prediabetes 12/22/2019   Mixed hyperlipidemia 12/22/2019   RLS (restless legs syndrome) 12/22/2019   Psoriatic arthritis (HCC) 10/28/2019   Acute cystitis with hematuria 09/29/2019   Pain and swelling of right knee 12/25/2018   Laryngopharyngeal reflux (LPR) 09/12/2018   Vocal fold atrophy 09/12/2018   Muscle tension dysphonia 09/12/2018   Cervical os stenosis 04/09/2015   Postmenopausal atrophic vaginitis 04/09/2015    ONSET DATE: 03/04/24 referral   REFERRING DIAG:  R26.89 (ICD-10-CM) - Other abnormalities of gait and mobility  Z91.81 (ICD-10-CM) - History of falling    THERAPY DIAG:  Difficulty in walking, not elsewhere classified - Plan: PT plan of care cert/re-cert  Muscle weakness (generalized) - Plan: PT plan of care cert/re-cert  Unsteadiness on feet - Plan: PT plan of care cert/re-cert  Rationale for Evaluation and Treatment: Rehabilitation  SUBJECTIVE:                                                                                                                                                                                              SUBJECTIVE STATEMENT: Patient arrives to clinic alone,  using SPC. 3 years ago had R TKA. Then had a back fusion a year ago and reports that she wasn't as diligent with that PT. Per patient, PCP instructed patient to use a cane. She took herself off the cane and subsequently had a fall in a parking lot, mainly on her R side. She is now using the cane again. Reports she does not feel as though her balance is that good.  Pt accompanied by: self  PERTINENT HISTORY: achilles tendonosis, anxiety, chronic LBP, glaucoma, GERD, HLD, hypothyroidism, psoriatic arthritis, RLS, skin CA, spondylolisthesis L4-L5  PAIN:  Are you having pain? No  PRECAUTIONS: Fall  RED FLAGS: Bowel or bladder incontinence: Yes: known fecal incontinence    WEIGHT BEARING RESTRICTIONS: No  FALLS: Has patient fallen in last 6 months? Yes. Number of falls 1, see above  LIVING ENVIRONMENT: Lives with: lives with their spouse Lives in: House/apartment Stairs: Yes: Internal: 13 steps; on right going up and External: 8, 2 steps; on right going up Has following equipment at home: Single point cane, Walker - 2 wheeled, shower chair, and Grab bars  PLOF: Independent  PATIENT GOALS: to have better balance  OBJECTIVE:  Note: Objective measures were completed at Evaluation unless otherwise noted.  DIAGNOSTIC FINDINGS: 08/04/22 lumbar MRI IMPRESSION: 1. Lumbar spine spondylosis as described above. 2. No acute osseous injury of the lumbar spine.  COGNITION: Overall cognitive status: Within functional limits for tasks assessed   SENSATION: WFL  COORDINATION: WFL B LE heel shin/ figure8   POSTURE: No Significant postural limitations    LOWER EXTREMITY MMT:    MMT Right Eval Left Eval  Hip flexion 4 4  Hip extension    Hip abduction 5 5  Hip adduction 5 5  Hip internal rotation    Hip external rotation    Knee flexion 4 4  Knee extension 5 4  Ankle dorsiflexion 5 4  Ankle  plantarflexion    Ankle inversion    Ankle eversion    (Blank rows = not tested)  BED MOBILITY:  Independent   GAIT: Findings: Gait Characteristics: trendelenburg  FUNCTIONAL TESTS:  MCTSIB:  Condition 1: 30s  Condition 2: 30s, mod sway  Condition 3: 30s mod sway  Condition 4: 17s   [facility] PT Assessment - 04/04/24 0001       Functional Gait  Assessment   Gait assessed  Yes    Gait Level Surface Walks 20 ft in less than 7 sec but greater than 5.5 sec, uses assistive device, slower speed, mild gait deviations, or deviates 6-10 in outside of the 12 in walkway width.    Change in Gait Speed Able to change speed, demonstrates mild gait deviations, deviates 6-10 in outside of the 12 in walkway width, or no gait deviations, unable to achieve a major change in velocity, or uses a change in velocity, or uses an assistive device.    Gait with Horizontal Head Turns Performs head turns with moderate changes in gait velocity, slows down, deviates 10-15 in outside 12 in walkway width but recovers, can continue to walk.    Gait with Vertical Head Turns Performs task with slight change in gait velocity (eg, minor disruption to smooth gait path), deviates 6 - 10 in outside 12 in walkway width or uses assistive device    Gait and Pivot Turn Pivot turns safely in greater than 3 sec and stops with no loss of balance, or pivot turns safely within 3 sec and stops with mild imbalance,  requires small steps to catch balance.    Step Over Obstacle Is able to step over one shoe box (4.5 in total height) but must slow down and adjust steps to clear box safely. May require verbal cueing.    Gait with Narrow Base of Support Ambulates less than 4 steps heel to toe or cannot perform without assistance.    Gait with Eyes Closed Walks 20 ft, slow speed, abnormal gait pattern, evidence for imbalance, deviates 10-15 in outside 12 in walkway width. Requires more than 9 sec to ambulate 20 ft.    Ambulating Backwards Walks 20  ft, uses assistive device, slower speed, mild gait deviations, deviates 6-10 in outside 12 in walkway width.    Steps Two feet to a stair, must use rail.    Total Score 14           PATIENT SURVEYS:  ABC scale: The Activities-Specific Balance Confidence (ABC) Scale 0% 10 20 30  40 50 60 70 80 90 100% No confidence<->completely confident  "How confident are you that you will not lose your balance or become unsteady when you . . .   Date tested 04/04/24  Walk around the house 85%  2. Walk up or down stairs 85%  3. Bend over and pick up a slipper from in front of a closet floor 90%  4. Reach for a small can off a shelf at eye level 90%  5. Stand on tip toes and reach for something above your head 90%  6. Stand on a chair and reach for something 0%  7. Sweep the floor 100%  8. Walk outside the house to a car parked in the driveway 85%  9. Get into or out of a car 100%  10. Walk across a parking lot to the mall 85%  11. Walk up or down a ramp 70%  12. Walk in a crowded mall where people rapidly walk past you 75%  13. Are bumped into by people as you walk through the mall 20%  14. Step onto or off of an escalator while you are holding onto the railing 20%  15. Step onto or off an escalator while holding onto parcels such that you cannot hold onto the railing 0%  16. Walk outside on icy sidewalks 0%  Total: #/16 62%                                                                                                                                 TREATMENT  Self care/home management: -use of AD AAT, esp while in community  -inhibition of muscles due to pain    PATIENT EDUCATION: Education details: PT POC, exam findings, see above Person educated: Patient Education method: Explanation Education comprehension: verbalized understanding and needs further education  HOME EXERCISE PROGRAM: To be provided  GOALS: Goals reviewed with patient? Yes  SHORT TERM GOALS: 05/03/24  1. Pt  will be independent with initial  HEP for improved functional strength and balance  Baseline: to be provided Goal status: INITIAL    LONG TERM GOALS: Target date: 05/31/24  Pt will be independent with final HEP for improved functional strength and balance  Baseline: to be provided Goal status: INITIAL  2.  Pt will improve FGA to >/= 19/30 to demonstrate improved balance and reduced fall risk Baseline: 14/30 Goal status: INITIAL  3.  Patient will be able to complete >/= 30s on condition 4 of MCTSIB to demonstrate improved balance Baseline: 17s Goal status: INITIAL  4.  Patient will score >/=80% on the ABC questionnaire to demonstrate improved confidence that she will not lose her balance with everyday tasks Baseline: 62% Goal status: INITIAL   ASSESSMENT:  CLINICAL IMPRESSION: Patient is a 73 y.o. female who was seen today for physical therapy evaluation and treatment for impaired balance and falls. She has a complex ortho history with a RTKA, lumbar fusion and a need for a L TKA. All of these factors contribute to impaired balance. Patient demonstrates increased fall risk as noted by score of 14/30 on  Functional Gait Assessment.   <22/30 = predictive of falls, <20/30 = fall in 6 months, <18/30 = predictive of falls in PD MCID: 5 points stroke population, 4 points geriatric population (ANPTA Core Set of Outcome Measures for Adults with Neurologic Conditions, 2018). Her MCTSIB score is reflective of impaired balance, primarily with vision occluded and more likely on a compliant surface. Activities-specific Balance Confidence Scale:  Score: 62% Increased risk of falls in community-dwelling, older adults <80% (79.89%)  0% = no confidence - 100% = complete confidence (ANPTA Core Set of Outcome Measures for Adults with Neurologic Conditions, 2018) For now, she does continue to benefit from use of a SPC to minimize her risk for falling, especially whe nou tin the community. She would  benefit from skilled PT services to address the above mentioned deficits.     OBJECTIVE IMPAIRMENTS: Abnormal gait, decreased balance, decreased knowledge of condition, decreased knowledge of use of DME, decreased strength, and pain.   ACTIVITY LIMITATIONS: carrying, lifting, bending, squatting, stairs, locomotion level, and caring for others  PARTICIPATION LIMITATIONS: meal prep, cleaning, interpersonal relationship, driving, shopping, community activity, and occupation  PERSONAL FACTORS: Age, Fitness, Past/current experiences, and Time since onset of injury/illness/exacerbation are also affecting patient's functional outcome.   REHAB POTENTIAL: Good  CLINICAL DECISION MAKING: Stable/uncomplicated  EVALUATION COMPLEXITY: Low  PLAN:  PT FREQUENCY: 2x/week  PT DURATION: 4 weeks  PLANNED INTERVENTIONS: 97164- PT Re-evaluation, 97750- Physical Performance Testing, 97110-Therapeutic exercises, 97530- Therapeutic activity, [id number]- Neuromuscular re-education, 97535- Self Care, [id number]- Manual therapy, [id number]- Gait training, [phone]- Orthotic Initial, [phone]- Orthotic/Prosthetic subsequent, [phone]- Aquatic Therapy, [phone]- Electrical stimulation (manual), Patient/Family education, Balance training, Stair training, Vestibular training, Visual/preceptual remediation/compensation, DME instructions, Cryotherapy, and Moist heat  PLAN FOR NEXT SESSION: HEP for functional strength and balance, EO/EC, head turns, compliant surfaces   Delon DELENA Pop, PT Delon DELENA Pop, PT, DPT, CBIS  04/04/2024, 11:53 AM

## 2024-04-11 ENCOUNTER — Encounter: Payer: Self-pay | Admitting: Gastroenterology

## 2024-04-11 ENCOUNTER — Ambulatory Visit: Admitting: Gastroenterology

## 2024-04-11 VITALS — BP 128/75 | HR 65 | Temp 97.2°F | Resp 12 | Ht 60.0 in | Wt 148.0 lb

## 2024-04-11 DIAGNOSIS — K5900 Constipation, unspecified: Secondary | ICD-10-CM

## 2024-04-11 DIAGNOSIS — K5730 Diverticulosis of large intestine without perforation or abscess without bleeding: Secondary | ICD-10-CM | POA: Diagnosis not present

## 2024-04-11 DIAGNOSIS — R194 Change in bowel habit: Secondary | ICD-10-CM | POA: Diagnosis not present

## 2024-04-11 DIAGNOSIS — K640 First degree hemorrhoids: Secondary | ICD-10-CM

## 2024-04-11 DIAGNOSIS — R159 Full incontinence of feces: Secondary | ICD-10-CM

## 2024-04-11 DIAGNOSIS — F329 Major depressive disorder, single episode, unspecified: Secondary | ICD-10-CM | POA: Diagnosis not present

## 2024-04-11 DIAGNOSIS — E039 Hypothyroidism, unspecified: Secondary | ICD-10-CM | POA: Diagnosis not present

## 2024-04-11 DIAGNOSIS — F419 Anxiety disorder, unspecified: Secondary | ICD-10-CM | POA: Diagnosis not present

## 2024-04-11 MED ORDER — SODIUM CHLORIDE 0.9 % IV SOLN: 500.0000 mL | Freq: Once | INTRAVENOUS | Status: DC

## 2024-04-11 NOTE — Progress Notes (Signed)
 Report given to PACU, vss

## 2024-04-11 NOTE — Op Note (Signed)
 [facility] Patient Name: Damon Baisch Procedure Date: 04/11/2024 1:48 PM MRN: [id number] Endoscopist: Lynnie Bring , MD, [id number] Age: 72 Referring MD:  Date of Birth: [birth year] Gender: Female Account #: [id number] Procedure:                Colonoscopy Indications:              Change in bowel habits. Medicines:                Monitored Anesthesia Care Procedure:                Pre-Anesthesia Assessment:                           - Prior to the procedure, a History and Physical                            was performed, and patient medications and                            allergies were reviewed. The patient's tolerance of                            previous anesthesia was also reviewed. The risks                            and benefits of the procedure and the sedation                            options and risks were discussed with the patient.                            All questions were answered, and informed consent                            was obtained. Prior Anticoagulants: The patient has                            taken no anticoagulant or antiplatelet agents. ASA                            Grade Assessment: II - A patient with mild systemic                            disease. After reviewing the risks and benefits,                            the patient was deemed in satisfactory condition to                            undergo the procedure.                           After obtaining informed consent, the colonoscope  was passed under direct vision. Throughout the                            procedure, the patient's blood pressure, pulse, and                            oxygen  saturations were monitored continuously. The                            Olympus Scope PCF SN: [id number] was introduced                            through the anus and advanced to the 2 cm into the                            ileum. The colonoscopy was performed  without                            difficulty. The patient tolerated the procedure                            well. The quality of the bowel preparation was                            good. The terminal ileum, ileocecal valve,                            appendiceal orifice, and rectum were photographed. Scope In: 1:58:41 PM Scope Out: 2:11:38 PM Scope Withdrawal Time: 0 hours 8 minutes 3 seconds  Total Procedure Duration: 0 hours 12 minutes 57 seconds  Findings:                 Multiple large-mouthed and medium-mouthed                            diverticula were found in the sigmoid colon, few in                            descending colon, transverse colon and ascending                            colon. There was narrowing of the colon in                            association with the diverticular opening in                            sigmoid colon suggestive of muscular hypertrophy.                            The majority of the diverticulum within 30 cm from                            the anal verge in  the sigmoid colon. No endoscopic                            evidence of diverticulitis.                           Non-bleeding internal hemorrhoids were found during                            retroflexion. The hemorrhoids were small and Grade                            I (internal hemorrhoids that do not prolapse).                           The terminal ileum appeared normal.                           Retroflexion in the right colon was performed.                           The entire examined colon appeared normal on direct                            and retroflexion views. Complications:            No immediate complications. Estimated Blood Loss:     Estimated blood loss: none. Impression:               - Mod to severe sigmoid diverticulosis.                           - Non-bleeding internal hemorrhoids.                           - The examined portion of the ileum was normal.                            - The entire examined colon is normal on direct and                            retroflexion views.                           - No specimens collected. Recommendation:           - Patient has a contact number available for                            emergencies. The signs and symptoms of potential                            delayed complications were discussed with the                            patient. Return to normal activities tomorrow.  Written discharge instructions were provided to the                            patient.                           - Resume previous diet.                           - Continue present medications including fiber                            supplements.                           - Repeat colonoscopy is not recommended for                            screening purposes.                           - The findings and recommendations were discussed                            with the patient's family. FU PRN. Lynnie Bring, MD 04/11/2024 2:17:42 PM This report has been signed electronically.

## 2024-04-11 NOTE — Progress Notes (Signed)
 1400 Patient experiencing nausea and states she get sick with anesthesia.  MD updated and Zofran  4 mg IV given, vss

## 2024-04-11 NOTE — Progress Notes (Signed)
 Olivia Cruz [id number] July 11, 1951     Chief Complaint: Bowel incontinence   Referring Provider: Sun, Vyvyan, MD Primary GI MD: Dr. Charlanne   HPI: Olivia Cruz is a 73 y.o. female with past medical history of asthma, right knee replacement, chronic back pain, anxiety/depression, diverticulitis, GERD, hypothyroidism, migraines, HLD, heart murmur who presents today for a complaint of bowel incontinence.     Patient last seen in office 09/18/2023 by Nestor Blower, PA-C for complaint of acute on chronic constipation, referred by PCP.  Had been seen at [facility] ED 07/17/2023 due to concern for bowel obstruction.  Workup was negative and she was discharged with Bentyl  and MiraLAX .   At last office visit patient reported history of hard stools and stool burden.  Recent episodes of severe abdominal cramping and worsening constipation with occasional episodes of suspected overflow diarrhea.  She had been on MiraLAX  every 3 days and Bentyl  as needed for cramping.  Thought was that her chronic constipation has worsened over the last few months due to becoming more sedentary s/p knee replacement, as well as poor water intake. She was advised to increase MiraLAX  to daily use, and increase water intake. Repeat colonoscopy due to change in bowel habits was discussed.  With recent preop abnormal EKG and cardiac workup, recommended holding off until cardiac workup complete. Patient did have a normal colonoscopy in 2019 with recommended recall 2029.   Abdominal x-ray 09/18/2023 showed moderate stool burden without bowel obstruction. TSH 09/18/2023 was normal at 1.9.   Patient called 11/01/2023 complaining of severe, explosive, watery diarrhea and associated lower abdominal cramping.  Noted some BRBPR in small amounts which patient attributed to frequent bathroom trips.  Estimated 20 bowel movements daily, every 20 to 30 minutes.  She had recently had cataract surgery and used optic  antibiotics, but no other antibiotic exposure or sick contacts.   Abdominal x-ray 11/17/2023 showed small volume of formed stool in the colon with nonobstructive bowel gas pattern.   Seen by pulmonology, had unremarkable chest CT 12/25/2023, completed course of azithromycin  and prednisone and was feeling much better at follow-up 01/16/2024.  Discussed controlling reflux symptoms.  Planning to follow-up as needed.   Recently saw PCP Gwen) 02/02/2024, noted to have functional constipation with overflow incontinence. Was advised to take Miralax  daily and add stool softener, add fiber, increase water. If not improved, advised to go back to GI.      Patient states diarrhea she was having back in March resolved shortly after she called in with her symptoms.  States she spoke with Dr. Charlanne on the phone the next day and was feeling better without diarrhea.  She did not complete GI pathogen panel for this reason.   States she was doing pretty well until a few weeks ago, went to the bathroom and realized she had passed stool without knowing it.  Has been having intermittent incontinence.  She will intermittently feel constipated and pass small balls of stool without even knowing it.  Otherwise stools are soft and mushy, can be formed.   States she saw her PCP a couple weeks ago and was advised to take MiraLAX  daily and Benefiber daily for 1 week, as well as increased fruits and vegetables and drink plenty of water.  States that taking MiraLAX  daily made everything worse and she called back and was advised to stop MiraLAX .   Yesterday she had 4 bowel movements, some urgency with the last bowel movement.  On MiraLAX  she  was having 7-8 loose stools a day.  Prior to MiraLAX  states she had a bowel movement once a day.   Reports that she has seen pelvic floor physical therapy in the past for urinary incontinence.  Has not had evaluation regarding fecal incontinence.   States she had a fall last week, felt dizzy and  has been having some low back pain since then.  Denies hitting her head.  Has discussed this with PCP and states she is going to be getting an x-ray.  She had previously been advised to use a cane, but had not been using for the last couple months.  She does have a cane with her today in the office and has started using this again.   She has followed up with cardiology and states that she had a negative workup and has been given clearance for left knee surgery.  Recent coronary CTA with no evidence of CAD.   She is on Pepcid  and Protonix  for GERD.  Denies any acid reflux or heartburn, but has had a chronic cough and has been cleared from a pulmonology standpoint.  Her pulmonologist increased her PPI to twice daily dosing with thought that her cough was due to GERD.  She has also made lifestyle modifications which have been helping.     Previous GI Procedures/Imaging    Colonoscopy 12/07/2017 - Moderate to severe sigmoid diverticulosis.  - Small internal hemorrhoids  - Otherwise normal colonoscopy to terminal ileum.   EGD 03/23/2016 - Mild gastritis - Incidental gastric polyps (status post polypectomy x 3) - Otherwise normal EGD Path: Esophagus, biopsy Benign squamous mucosa with no histopathologic abnormality Negative for eosinophilic esophagitis, dysplasia, or malignancy Stomach, polyps, gastric Fundic gland polyps in the setting of mild chronic gastritis A Warthin-Starry stain is negative for H. pylori Negative for intestinal metaplasia or malignancy       Past Medical History:  Diagnosis Date   Achilles tendonosis 10/06/2020   Acute cystitis with hematuria 09/29/2019   Acute infection of nasal sinus 06/21/2021   Allergic rhinoconjunctivitis     Anxiety     Asthma     Cardiac murmur 05/04/2022   Cervical os stenosis 04/09/2015   Chest pain     Chest pain of uncertain etiology 05/04/2022   Chronic back pain 09/23/2021   Chronic knee pain after total replacement of right knee  joint 07/21/2020   Chronic right shoulder pain 05/25/2021   Depression     Depression, major, recurrent, mild (HCC)     Diverticulitis     Dysuria 07/12/2021   GERD (gastroesophageal reflux disease)     Glaucoma     Heart murmur     Hypercholesterolemia     Hypothyroidism     Ingrown nail 02/04/2021   Insomnia     Laryngopharyngeal reflux (LPR)     Memory changes     Migraine without aura and without status migrainosus, not intractable 05/07/2021   Migraines     Mild recurrent major depression (HCC) 07/12/2021   Mixed hyperlipidemia 12/22/2019   Muscle tension dysphonia 09/12/2018   Muscle wasting and atrophy, not elsewhere classified, left hand 12/01/2020   Nodule of finger of right hand 12/01/2020   Osteoarthritis     Osteopenia     Pain and swelling of right knee 12/25/2018   Postmenopausal atrophic vaginitis 04/09/2015   Prediabetes 12/22/2019   Psoriatic arthritis (HCC)     RBBB     RLS (restless legs syndrome)     Seasonal  allergic rhinitis due to pollen 06/21/2021   Secondary hypothyroidism 12/22/2019   Skin cancer     Snoring 05/07/2021   Spondylolisthesis at L4-L5 level 04/27/2022   Status post total right knee replacement 12/29/2021   STD (sexually transmitted disease)      HSV II   Tendinopathy of right rotator cuff 05/25/2021   Tightness of heel cord, left 11/10/2020   Unilateral primary osteoarthritis, left knee 09/22/2022   Vocal fold atrophy 09/12/2018               Past Surgical History:  Procedure Laterality Date   BACK SURGERY   2024    fusion of L4 and L5   BELPHAROPTOSIS REPAIR       BREAST SURGERY Left      times 2   CARDIAC CATHETERIZATION   10/05/2015   COLONOSCOPY   06/03/2013    Moderate predominantly sigmoid diverticulosis. Small interal hemorroids   FOOT SURGERY Right      Removed bone spur   GANGLION CYST EXCISION Left      foot   HEMORRHOID SURGERY       SKIN CANCER EXCISION       TOTAL KNEE ARTHROPLASTY Right 07/21/2020    UPPER GI ENDOSCOPY   03/23/2016    Mild gastritis, gastric polyps bxbenign squamous mucosa with no abnormaility, fundic glad poly in setting of mild chron gastritis.                Current Outpatient Medications  Medication Sig Dispense Refill   acetaminophen  (TYLENOL ) 160 MG/5ML liquid Take by mouth every 4 (four) hours as needed for fever.       Adalimumab (HUMIRA PEN) 40 MG/0.4ML PNKT Inject 40 mg into the skin once a week. Unsure if dose is 40 mg- every other week       ascorbic acid (VITAMIN C) 500 MG tablet Take 500 mg by mouth daily.       beclomethasone (QVAR  REDIHALER) 40 MCG/ACT inhaler INHALE 2 INHALATIONS BY MOUTH 1  TO 2 TIMES DAILY TO PREVENT  COUGH OR WHEEZE. RINSE, GARGLE,  AND SPIT AFTER USE 42.4 g 0   Biotin w/ Vitamins C & E (HAIR/SKIN/NAILS PO) Take 1 tablet by mouth daily.       budesonide  (RHINOCORT  AQUA) 32 MCG/ACT nasal spray Place 1 spray into both nostrils daily. Reported on 02/04/2016 8.43 mL 3   CALCIUM  MAGNESIUM  ZINC PO Take by mouth.       Calcium -Vitamin D -Vitamin K [id number] MG-UNT-MCG TABS Take 1 tablet by mouth daily.       cetirizine  (ZYRTEC ) 10 MG tablet Take 1 tablet (10 mg total) by mouth daily as needed for allergies. 90 tablet 1   clindamycin (CLEOCIN) 300 MG capsule Take 300 mg by mouth as needed. AS NEEDED FOR DENTAL PROCEDURE       diclofenac  Sodium (VOLTAREN ) 1 % GEL Apply 1 Application topically as needed for pain (Knee pain).       dicyclomine  (BENTYL ) 20 MG tablet Take 20 mg by mouth every 6 (six) hours as needed for spasms.       famotidine  (PEPCID ) 40 MG tablet TAKE 1 TABLET BY MOUTH DAILY AT  BEDTIME 100 tablet 2   levothyroxine  (SYNTHROID ) 50 MCG tablet TAKE 1 TABLET BY MOUTH DAILY 90 tablet 3   methocarbamol  (ROBAXIN ) 500 MG tablet Take 1 tablet (500 mg total) by mouth every 6 (six) hours as needed for muscle spasms. 30 tablet 3   Multiple Vitamin (MULTIVITAMIN  WITH MINERALS) TABS tablet Take 1 tablet by mouth daily. Woman 50+        nitrofurantoin  (MACRODANTIN ) 100 MG capsule Take 100 mg by mouth as needed (after  intercourse).       nitroGLYCERIN  (NITROSTAT ) 0.4 MG SL tablet Place 1 tablet (0.4 mg total) under the tongue every 5 (five) minutes as needed for chest pain. 25 tablet 6   pantoprazole  (PROTONIX ) 40 MG tablet Take 1 tablet 1-2 times per day 180 tablet 1   Propylene Glycol, PF, (SYSTANE COMPLETE PF) 0.6 % SOLN Place 1 drop into both eyes daily as needed (dry eyes).       rosuvastatin  (CRESTOR ) 20 MG tablet Take 1 tablet (20 mg total) by mouth every Monday, Wednesday, and Friday. 36 tablet 3   terbinafine  (LAMISIL ) 250 MG tablet TAKE 1 TABLET BY MOUTH EVERY DAY 42 tablet 2   valACYclovir  (VALTREX ) 500 MG tablet Take 1 tablet (500 mg total) by mouth 3 (three) times daily as needed (herpes outbreak). 21 tablet 0      No current facility-administered medications for this visit.             Allergies as of 02/13/2024 - Review Complete 01/22/2024  Allergen Reaction Noted   Codeine Anaphylaxis and Nausea Only 02/14/2013   Ambien [zolpidem tartrate] Other (See Comments) 09/05/2016   Clonazepam    01/05/2021   Cyproheptadine  hcl Other (See Comments) 09/26/2019   Dilaudid  [hydromorphone ]   09/18/2017   Hydrocodone Other (See Comments) 09/28/2015   Lisinopril Cough 09/26/2019   Pramipexole  Nausea Only 05/07/2021   Sulfa antibiotics Other (See Comments) 02/14/2013   Tramadol Nausea And Vomiting 09/28/2015           Family History  Problem Relation Age of Onset   Breast cancer Sister          lung   Cancer Maternal Grandmother     Multiple births Maternal Grandmother     Multiple births Paternal Grandmother     Cancer Paternal Grandmother     Thyroid  disease Mother     Heart failure Mother     Heart disease Mother     Hyperlipidemia Mother     Hypertension Mother     Stroke Mother     Depression Mother     Parkinson's disease Father     Glaucoma Father     Diverticulitis Father            Social  History  Social History         Tobacco Use   Smoking status: Former      Current packs/day: 0.00      Types: Cigarettes      Quit date: 1986      Years since quitting: 39.5   Smokeless tobacco: Never  Vaping Use   Vaping status: Never Used  Substance Use Topics   Alcohol  use: No      Alcohol /week: 0.0 standard drinks of alcohol    Drug use: No        Review of Systems:    Constitutional: No fever, chills, weakness or fatigue Eyes: No change in vision Ears, Nose, Throat:  No change in hearing or congestion Skin: No rash or itching Cardiovascular: No chest pain, chest pressure or palpitations   Respiratory: No SOB  Gastrointestinal: See HPI and otherwise negative Genitourinary: No dysuria or change in urinary frequency Neurological: No headache or syncope. Positive dizziness with fall last week, dizziness resolved. Musculoskeletal: New low back pain since fall Hematologic:  No bleeding      Physical Exam:  Vital signs: BP 116/78   Pulse 89   Ht 5' (1.524 m)   Wt 148 lb (67.1 kg)   LMP 08/22/2005   SpO2 96%   BMI 28.90 kg/m        Wt Readings from Last 3 Encounters:  02/13/24 148 lb (67.1 kg)  09/18/23 161 lb (73 kg)  09/11/23 162 lb (73.5 kg)      Constitutional: NAD, Well developed, Well nourished, alert and cooperative Head:  Normocephalic and atraumatic.  Eyes: No scleral icterus. Conjunctiva pink. Mouth: No oral lesions. Respiratory: Respirations even and unlabored. Lungs clear to auscultation bilaterally.  No wheezes, crackles, or rhonchi.  Cardiovascular:  Regular rate and rhythm. No murmurs. No peripheral edema. Gastrointestinal:  Soft, nondistended, nontender. No rebound or guarding. Normal bowel sounds. No appreciable masses or hepatomegaly. Rectal:  Deferred to colonoscopy Neurologic:  Alert and oriented x4;  grossly normal neurologically.  Skin:   Dry and intact without significant lesions or rashes. Psychiatric: Oriented to person, place and  time. Demonstrates good judgement and reason without abnormal affect or behaviors.     RELEVANT LABS AND IMAGING: CBC Labs (Brief)          Component Value Date/Time    WBC 5.5 01/22/2024 1642    WBC 13.5 (H) 01/31/2023 0742    RBC 4.80 01/22/2024 1642    RBC 3.83 (L) 01/31/2023 0742    HGB 14.5 01/22/2024 1642    HGB 14.2 02/19/2014 1453    HCT 43.4 01/22/2024 1642    PLT 260 01/22/2024 1642    MCV 90 01/22/2024 1642    MCH 30.2 01/22/2024 1642    MCH 29.8 01/31/2023 0742    MCHC 33.4 01/22/2024 1642    MCHC 33.3 01/31/2023 0742    RDW 13.7 01/22/2024 1642    LYMPHSABS 2.3 09/11/2023 1429    EOSABS 0.2 09/11/2023 1429    BASOSABS 0.0 09/11/2023 1429        CMP     Labs (Brief)          Component Value Date/Time    NA 143 09/11/2023 1429    K 4.2 09/11/2023 1429    CL 104 09/11/2023 1429    CO2 23 09/11/2023 1429    GLUCOSE 100 (H) 09/11/2023 1429    GLUCOSE 142 (H) 01/31/2023 0742    BUN 10 09/11/2023 1429    CREATININE 0.69 09/11/2023 1429    CREATININE 0.71 02/18/2013 1029    CALCIUM  9.7 09/11/2023 1429    PROT 6.6 01/22/2024 1642    ALBUMIN  4.3 01/22/2024 1642    AST 28 01/22/2024 1642    ALT 21 01/22/2024 1642    ALKPHOS 114 01/22/2024 1642    BILITOT 0.2 01/22/2024 1642    GFRNONAA >60 01/31/2023 0742    GFRAA 102 09/15/2020 1101          Assessment/Plan:    Change in bowel habits Constipation Fecal incontinence Patient with change in bowel habits since January.  Has had findings consistent with constipation and overflow diarrhea.  Possible viral illness in March which caused severe diarrhea and frequent bowel movements.  This has since resolved.  She does occasionally pass small balls of stool, otherwise stools are soft and mushy.  She denies any blood in her stool or melena.  Has had some fecal incontinence, and states that sometimes she will have passed stool and not realize it until she goes to the bathroom.  Symptoms worsen with MiraLAX  so she  has stopped taking. Having 4 bowel movements a day.  States she used to have 1 bowel movement daily.   It was discussed at last visit that patient may need repeat colonoscopy based on this change in bowel habits.  Last colonoscopy in 2019 was normal.   - Schedule colonoscopy. I thoroughly discussed the procedure with the patient to include nature of the procedure, alternatives, benefits, and risks (including but not limited to bleeding, infection, perforation, anesthesia/cardiac/pulmonary complications). Patient verbalized understanding and gave verbal consent to proceed with procedure.  - Request cardiac clearance. - Recommend daily fiber supplement - Consider referral to pelvic floor physical therapy for fecal incontinence   GERD Patient denies any acid reflux or heartburn on PPI and Pepcid , however she has had a chronic cough and was worked up by pulmonology with negative findings.  Cough thought to be due to allergies versus GERD.  They increased her PPI to twice daily.  She says she is doing well.   - Continue Protonix  40 mg twice daily - Continue Pepcid  40 mg daily - Lifestyle modifications for GERD     Camie Furbish, PA-C     Attending physician's note   I have taken history, reviewed the chart and examined the patient. I performed a substantive portion of this encounter, including complete performance of at least one of the key components, in conjunction with the APP. I agree with the Advanced Practitioner's note, impression and recommendations.    Anselm Bring, MD [facility] [phone]

## 2024-04-11 NOTE — Patient Instructions (Signed)
 Resume previous diet. Continue present medications including fiber supplements. Repeat colonoscopy is not recommended for screening purposes. Follow up as needed. Handouts provided on diverticulosis and hemorrhoids.   YOU HAD AN ENDOSCOPIC PROCEDURE TODAY AT THE [facility]:   Refer to the procedure report that was given to you for any specific questions about what was found during the examination.  If the procedure report does not answer your questions, please call your gastroenterologist to clarify.  If you requested that your care partner not be given the details of your procedure findings, then the procedure report has been included in a sealed envelope for you to review at your convenience later.  YOU SHOULD EXPECT: Some feelings of bloating in the abdomen. Passage of more gas than usual.  Walking can help get rid of the air that was put into your GI tract during the procedure and reduce the bloating. If you had a lower endoscopy (such as a colonoscopy or flexible sigmoidoscopy) you may notice spotting of blood in your stool or on the toilet paper. If you underwent a bowel prep for your procedure, you may not have a normal bowel movement for a few days.  Please Note:  You might notice some irritation and congestion in your nose or some drainage.  This is from the oxygen  used during your procedure.  There is no need for concern and it should clear up in a day or so.  SYMPTOMS TO REPORT IMMEDIATELY:  Following lower endoscopy (colonoscopy or flexible sigmoidoscopy):  Excessive amounts of blood in the stool  Significant tenderness or worsening of abdominal pains  Swelling of the abdomen that is new, acute  Fever of 100F or higher  For urgent or emergent issues, a gastroenterologist can be reached at any hour by calling [phone]. Do not use MyChart messaging for urgent concerns.    DIET:  We do recommend a small meal at first, but then you may proceed to your regular  diet.  Drink plenty of fluids but you should avoid alcoholic beverages for 24 hours.  ACTIVITY:  You should plan to take it easy for the rest of today and you should NOT DRIVE or use heavy machinery until tomorrow (because of the sedation medicines used during the test).    FOLLOW UP: Our staff will call the number listed on your records the next business day following your procedure.  We will call around 7:15- 8:00 am to check on you and address any questions or concerns that you may have regarding the information given to you following your procedure. If we do not reach you, we will leave a message.     If any biopsies were taken you will be contacted by phone or by letter within the next 1-3 weeks.  Please call us  at [phone] if you have not heard about the biopsies in 3 weeks.    SIGNATURES/CONFIDENTIALITY: You and/or your care partner have signed paperwork which will be entered into your electronic medical record.  These signatures attest to the fact that that the information above on your After Visit Summary has been reviewed and is understood.  Full responsibility of the confidentiality of this discharge information lies with you and/or your care-partner.

## 2024-04-12 ENCOUNTER — Telehealth: Payer: Self-pay

## 2024-04-12 NOTE — Telephone Encounter (Signed)
 Left message on answering machine.

## 2024-04-18 ENCOUNTER — Ambulatory Visit

## 2024-04-18 DIAGNOSIS — M6281 Muscle weakness (generalized): Secondary | ICD-10-CM

## 2024-04-18 DIAGNOSIS — R262 Difficulty in walking, not elsewhere classified: Secondary | ICD-10-CM | POA: Diagnosis not present

## 2024-04-18 DIAGNOSIS — R2681 Unsteadiness on feet: Secondary | ICD-10-CM

## 2024-04-18 NOTE — Therapy (Signed)
 OUTPATIENT PHYSICAL THERAPY NEURO TREATMENT   Patient Name: Olivia Cruz MRN: [id number] DOB:Jun 18, 1951, 73 y.o., female Today's Date: 04/18/2024   PCP: Vyvyan Sun, MD REFERRING PROVIDER: Arnett Repress, MD  END OF SESSION:  PT End of Session - 04/18/24 1535     Visit Number 2    Number of Visits 9    Date for PT Re-Evaluation 05/31/24    Authorization Type HTA    PT Start Time 1534    PT Stop Time 1613    PT Time Calculation (min) 39 min    Equipment Utilized During Treatment Gait belt    Activity Tolerance Patient tolerated treatment well    Behavior During Therapy WFL for tasks assessed/performed          Past Medical History:  Diagnosis Date   Achilles tendonosis 10/06/2020   Acute cystitis with hematuria 09/29/2019   Acute infection of nasal sinus 06/21/2021   Allergic rhinoconjunctivitis    Anxiety    Asthma    Cardiac murmur 05/04/2022   Cataract    Cervical os stenosis 04/09/2015   Chest pain    Chest pain of uncertain etiology 05/04/2022   Chronic back pain 09/23/2021   Chronic knee pain after total replacement of right knee joint 07/21/2020   Chronic right shoulder pain 05/25/2021   Depression    Depression, major, recurrent, mild (HCC)    Diverticulitis    Dysuria 07/12/2021   GERD (gastroesophageal reflux disease)    Heart murmur    Hypercholesterolemia    Hypertension    Hypothyroidism    Ingrown nail 02/04/2021   Insomnia    Laryngopharyngeal reflux (LPR)    Memory changes    Migraine without aura and without status migrainosus, not intractable 05/07/2021   Migraines    Mild recurrent major depression (HCC) 07/12/2021   Mixed hyperlipidemia 12/22/2019   Muscle tension dysphonia 09/12/2018   Muscle wasting and atrophy, not elsewhere classified, left hand 12/01/2020   Nodule of finger of right hand 12/01/2020   Osteoarthritis    Osteopenia    Pain and swelling of right knee 12/25/2018   Postmenopausal atrophic vaginitis 04/09/2015    Prediabetes 12/22/2019   Psoriatic arthritis (HCC)    RBBB    RLS (restless legs syndrome)    Seasonal allergic rhinitis due to pollen 06/21/2021   Secondary hypothyroidism 12/22/2019   Skin cancer    Sleep apnea    Snoring 05/07/2021   Spondylolisthesis at L4-L5 level 04/27/2022   Status post total right knee replacement 12/29/2021   STD (sexually transmitted disease)    HSV II   Tendinopathy of right rotator cuff 05/25/2021   Tightness of heel cord, left 11/10/2020   Unilateral primary osteoarthritis, left knee 09/22/2022   Vocal fold atrophy 09/12/2018   Past Surgical History:  Procedure Laterality Date   BACK SURGERY  2024   fusion of L4 and L5   BELPHAROPTOSIS REPAIR     BREAST SURGERY Left    times 2   CARDIAC CATHETERIZATION  10/05/2015   COLONOSCOPY  06/03/2013   Moderate predominantly sigmoid diverticulosis. Small interal hemorroids   FOOT SURGERY Right    Removed bone spur   GANGLION CYST EXCISION Left    foot   HEMORRHOID SURGERY     SKIN CANCER EXCISION     TOTAL KNEE ARTHROPLASTY Right 07/21/2020   UPPER GI ENDOSCOPY  03/23/2016   Mild gastritis, gastric polyps bxbenign squamous mucosa with no abnormaility, fundic glad poly in setting of mild  chron gastritis.   Patient Active Problem List   Diagnosis Date Noted   Heart murmur    Unilateral primary osteoarthritis, left knee 09/22/2022   Chest pain of uncertain etiology 05/04/2022   Cardiac murmur 05/04/2022   Allergic rhinoconjunctivitis 04/27/2022   Anxiety 04/27/2022   Asthma 04/27/2022   Chest pain 04/27/2022   Depression 04/27/2022   Depression, major, recurrent, mild (HCC) 04/27/2022   Diverticulitis 04/27/2022   GERD (gastroesophageal reflux disease) 04/27/2022   Glaucoma 04/27/2022   Hypercholesterolemia 04/27/2022   Hypothyroidism 04/27/2022   Insomnia 04/27/2022   Memory changes 04/27/2022   Migraines 04/27/2022   Osteoarthritis 04/27/2022   Osteopenia 04/27/2022   Skin cancer  04/27/2022   STD (sexually transmitted disease) 04/27/2022   Spondylolisthesis at L4-L5 level 04/27/2022   Status post total right knee replacement 12/29/2021   Mild recurrent major depression (HCC) 07/12/2021   Dysuria 07/12/2021   Acute infection of nasal sinus 06/21/2021   Seasonal allergic rhinitis due to pollen 06/21/2021   Chronic right shoulder pain 05/25/2021   Tendinopathy of right rotator cuff 05/25/2021   Migraine without aura and without status migrainosus, not intractable 05/07/2021   Snoring 05/07/2021   Ingrown nail 02/04/2021   Muscle wasting and atrophy, not elsewhere classified, left hand 12/01/2020   Nodule of finger of right hand 12/01/2020   Tightness of heel cord, left 11/10/2020   Achilles tendonosis 10/06/2020   Chronic knee pain after total replacement of right knee joint 07/21/2020   Secondary hypothyroidism 12/22/2019   Prediabetes 12/22/2019   Mixed hyperlipidemia 12/22/2019   RLS (restless legs syndrome) 12/22/2019   Psoriatic arthritis (HCC) 10/28/2019   Acute cystitis with hematuria 09/29/2019   Pain and swelling of right knee 12/25/2018   Laryngopharyngeal reflux (LPR) 09/12/2018   Vocal fold atrophy 09/12/2018   Muscle tension dysphonia 09/12/2018   Cervical os stenosis 04/09/2015   Postmenopausal atrophic vaginitis 04/09/2015    ONSET DATE: 03/04/24 referral   REFERRING DIAG:  R26.89 (ICD-10-CM) - Other abnormalities of gait and mobility  Z91.81 (ICD-10-CM) - History of falling    THERAPY DIAG:  Difficulty in walking, not elsewhere classified  Muscle weakness (generalized)  Unsteadiness on feet  Rationale for Evaluation and Treatment: Rehabilitation  SUBJECTIVE:                                                                                                                                                                                             SUBJECTIVE STATEMENT: Patient arrives to clinic with [facility] Reports no falls. Does have  intermittent L knee pain. Not using SPC in the house.  Pt accompanied by:  self  PERTINENT HISTORY: achilles tendonosis, anxiety, chronic LBP, glaucoma, GERD, HLD, hypothyroidism, psoriatic arthritis, RLS, skin CA, spondylolisthesis L4-L5  PAIN:  Are you having pain? No  PRECAUTIONS: Fall  PATIENT GOALS: to have better balance  OBJECTIVE:  Note: Objective measures were completed at Evaluation unless otherwise noted.  DIAGNOSTIC FINDINGS: 08/04/22 lumbar MRI IMPRESSION: 1. Lumbar spine spondylosis as described above. 2. No acute osseous injury of the lumbar spine.                                                                                                                              TREATMENT  NMR: -scifit hills level 3 x10 mins for neural priming  -initial HEP (see below)  -230' carrying 15lb Surge  -deadlift surge x12    PATIENT EDUCATION: Education details: initial HEP, impact on posture on balance and joint replacement on proprioception  Person educated: Patient Education method: Explanation, Demonstration, and Handouts Education comprehension: verbalized understanding and needs further education  HOME EXERCISE PROGRAM: Access Code: [email or web address] URL: [email or web address] Date: 04/18/2024 Prepared by: Delon Pop  Exercises - Corner Balance Feet Together With Eyes Open  - 1 x daily - 7 x weekly - 3 sets - 30s hold - Corner Balance Feet Together With Eyes Closed  - 1 x daily - 7 x weekly - 3 sets - 30s hold - Corner Balance Feet Together: Eyes Open With Head Turns  - 1 x daily - 7 x weekly - 3 sets - 30s hold - Corner Balance Feet Together: Eyes Closed With Head Turns  - 1 x daily - 7 x weekly - 3 sets - 30s hold - Semi-Tandem Corner Balance With Eyes Open  - 1 x daily - 7 x weekly - 3 sets - 30s hold - Semi-Tandem Corner Balance With Eyes Closed  - 1 x daily - 7 x weekly - 3 sets - 30s hold - Single Leg Balance with Opposite Leg Star Reach  - 1 x  daily - 7 x weekly - 3 sets - 10 reps  GOALS: Goals reviewed with patient? Yes  SHORT TERM GOALS: 05/03/24  1. Pt will be independent with initial HEP for improved functional strength and balance  Baseline: to be provided Goal status: INITIAL    LONG TERM GOALS: Target date: 05/31/24  Pt will be independent with final HEP for improved functional strength and balance  Baseline: to be provided Goal status: INITIAL  2.  Pt will improve FGA to >/= 19/30 to demonstrate improved balance and reduced fall risk Baseline: 14/30 Goal status: INITIAL  3.  Patient will be able to complete >/= 30s on condition 4 of MCTSIB to demonstrate improved balance Baseline: 17s Goal status: INITIAL  4.  Patient will score >/=80% on the ABC questionnaire to demonstrate improved confidence that she will not lose her balance with everyday tasks Baseline: 62% Goal status: INITIAL   ASSESSMENT:  CLINICAL IMPRESSION: Patient seen for skilled PT session  with emphasis on prescribing initial HEP. Tolerated balance on solid ground well with appropriate ankle strategy to regain mild LOB. Most challenged by semi-tandem L foot anterior EC. Surge used for increased core engagement as well as providing mild perturbations to gait. Continue POC.   OBJECTIVE IMPAIRMENTS: Abnormal gait, decreased balance, decreased knowledge of condition, decreased knowledge of use of DME, decreased strength, and pain.   ACTIVITY LIMITATIONS: carrying, lifting, bending, squatting, stairs, locomotion level, and caring for others  PARTICIPATION LIMITATIONS: meal prep, cleaning, interpersonal relationship, driving, shopping, community activity, and occupation  PERSONAL FACTORS: Age, Fitness, Past/current experiences, and Time since onset of injury/illness/exacerbation are also affecting patient's functional outcome.   REHAB POTENTIAL: Good  CLINICAL DECISION MAKING: Stable/uncomplicated  EVALUATION COMPLEXITY: Low  PLAN:  PT  FREQUENCY: 2x/week  PT DURATION: 4 weeks  PLANNED INTERVENTIONS: 97164- PT Re-evaluation, 97750- Physical Performance Testing, 97110-Therapeutic exercises, 97530- Therapeutic activity, [id number]- Neuromuscular re-education, 97535- Self Care, [id number]- Manual therapy, [id number]- Gait training, [phone]- Orthotic Initial, [phone]- Orthotic/Prosthetic subsequent, [phone]- Aquatic Therapy, [phone]- Electrical stimulation (manual), Patient/Family education, Balance training, Stair training, Vestibular training, Visual/preceptual remediation/compensation, DME instructions, Cryotherapy, and Moist heat  PLAN FOR NEXT SESSION:  EO/EC, head turns, compliant surfaces, resisted gait   Delon DELENA Pop, PT Delon DELENA Pop, PT, DPT, CBIS  04/18/2024, 4:18 PM

## 2024-04-28 ENCOUNTER — Other Ambulatory Visit: Payer: Self-pay | Admitting: Allergy and Immunology

## 2024-05-02 ENCOUNTER — Ambulatory Visit

## 2024-05-06 ENCOUNTER — Ambulatory Visit: Admitting: Podiatry

## 2024-05-06 ENCOUNTER — Encounter: Payer: Self-pay | Admitting: Podiatry

## 2024-05-06 DIAGNOSIS — M79674 Pain in right toe(s): Secondary | ICD-10-CM

## 2024-05-06 DIAGNOSIS — B351 Tinea unguium: Secondary | ICD-10-CM | POA: Diagnosis not present

## 2024-05-06 MED ORDER — CICLOPIROX 8 % EX SOLN: Freq: Every day | CUTANEOUS | 12 refills | Status: DC

## 2024-05-06 NOTE — Progress Notes (Unsigned)
 No chief complaint on file.   HPI: 73 y.o. female presents today for right hallux nail  onychomycosis.  She has completed her second 90-day course of oral terbinafine  a little over a month ago.  She does still have some residual nail changes affecting the ends of the nail but does have clear nail growth at the base.  Would like to discuss further treatment options.  Did end up getting a course of oral Lamisil  which he paid for out-of-pocket but has waited to take.  Past Medical History:  Diagnosis Date   Achilles tendonosis 10/06/2020   Acute cystitis with hematuria 09/29/2019   Acute infection of nasal sinus 06/21/2021   Allergic rhinoconjunctivitis    Anxiety    Asthma    Cardiac murmur 05/04/2022   Cataract    Cervical os stenosis 04/09/2015   Chest pain    Chest pain of uncertain etiology 05/04/2022   Chronic back pain 09/23/2021   Chronic knee pain after total replacement of right knee joint 07/21/2020   Chronic right shoulder pain 05/25/2021   Depression    Depression, major, recurrent, mild (HCC)    Diverticulitis    Dysuria 07/12/2021   GERD (gastroesophageal reflux disease)    Heart murmur    Hypercholesterolemia    Hypertension    Hypothyroidism    Ingrown nail 02/04/2021   Insomnia    Laryngopharyngeal reflux (LPR)    Memory changes    Migraine without aura and without status migrainosus, not intractable 05/07/2021   Migraines    Mild recurrent major depression (HCC) 07/12/2021   Mixed hyperlipidemia 12/22/2019   Muscle tension dysphonia 09/12/2018   Muscle wasting and atrophy, not elsewhere classified, left hand 12/01/2020   Nodule of finger of right hand 12/01/2020   Osteoarthritis    Osteopenia    Pain and swelling of right knee 12/25/2018   Postmenopausal atrophic vaginitis 04/09/2015   Prediabetes 12/22/2019   Psoriatic arthritis (HCC)    RBBB    RLS (restless legs syndrome)    Seasonal allergic rhinitis due to pollen 06/21/2021   Secondary  hypothyroidism 12/22/2019   Skin cancer    Sleep apnea    Snoring 05/07/2021   Spondylolisthesis at L4-L5 level 04/27/2022   Status post total right knee replacement 12/29/2021   STD (sexually transmitted disease)    HSV II   Tendinopathy of right rotator cuff 05/25/2021   Tightness of heel cord, left 11/10/2020   Unilateral primary osteoarthritis, left knee 09/22/2022   Vocal fold atrophy 09/12/2018    Past Surgical History:  Procedure Laterality Date   BACK SURGERY  2024   fusion of L4 and L5   BELPHAROPTOSIS REPAIR     BREAST SURGERY Left    times 2   CARDIAC CATHETERIZATION  10/05/2015   COLONOSCOPY  06/03/2013   Moderate predominantly sigmoid diverticulosis. Small interal hemorroids   FOOT SURGERY Right    Removed bone spur   GANGLION CYST EXCISION Left    foot   HEMORRHOID SURGERY     SKIN CANCER EXCISION     TOTAL KNEE ARTHROPLASTY Right 07/21/2020   UPPER GI ENDOSCOPY  03/23/2016   Mild gastritis, gastric polyps bxbenign squamous mucosa with no abnormaility, fundic glad poly in setting of mild chron gastritis.    Allergies  Allergen Reactions   Codeine Anaphylaxis and Nausea Only   Ambien [Zolpidem Tartrate] Other (See Comments)    Memory loss per patient   Clonazepam  Other (See Comments)  Other Reaction(s): Fatique, Mental Status Changes   Cyproheptadine  Hcl Other (See Comments)    Confusion   Dilaudid  [Hydromorphone ] Other (See Comments)    hallucinations   Hydrocodone Other (See Comments)    anaphylaxis   Lisinopril Cough   Pramipexole  Nausea Only   Sulfa Antibiotics Other (See Comments)    hives   Tramadol Nausea And Vomiting    ROS denies any nausea, vomiting, fever, chills, chest pain, shortness of breath   Physical Exam: There were no vitals filed for this visit.  General: The patient is alert and oriented x3 in no acute distress.  Dermatology: Skin is warm, dry and supple bilateral lower extremities. Interspaces are clear of maceration  and debris.  Right hallux nail plate evidence of clear nail growth present, 80 to 85% clear nail growth.  There is decreased nail thickness.  Still some changes present to the distal aspect where there is subungual debris and some yellow discoloration present as well as nail thickening greater than 3 millimeters.    Vascular: Palpable pedal pulses bilaterally. Capillary refill within normal limits.  No appreciable edema.  No erythema or calor.  Neurological: Light touch sensation grossly intact bilateral feet.   Musculoskeletal Exam: No pedal deformities noted  Assessment/Plan of Care: 1. Pain due to onychomycosis of toenail of right foot      Meds ordered this encounter  Medications   ciclopirox  (PENLAC ) 8 % solution    Sig: Apply topically at bedtime. Apply over nail and surrounding skin. Apply daily over previous coat. After seven (7) days, may remove with alcohol  and continue cycle.    Dispense:  6.6 mL    Refill:  12   None  Discussed clinical findings with patient today.  # Onychomycosis right first toenail - Has had about 85% nail clearance - Did debride the affected toenail today without incident.  Done using sterile nail nipper to reduce thickness and length. - We discussed further treatment including continuing oral terbinafine  versus trying topical treatment at this point.  Electing to proceed with topical treatment and holding further oral Lamisil . - Prescription for topical ciclopirox  8% nail lacquer sent to patient's pharmacy.  Proper application instructions discussed.  She can also apply 40 to 47% urea  based nail gel to the nails which may help improve penetration of the medicine into the nail plate. - Did discuss that some of the residual nail changes may be due to psoriasis as well.  Reevaluate about 3 months.   Suzie Vandam L. Lamount MAUL, AACFAS [facility]     [address] [place], [place] [address]                 Office [phone]  Fax [phone]

## 2024-05-06 NOTE — Patient Instructions (Signed)
 Look for urea  40-47% nail gel, cream or ointment and apply to the thickened toenails. This can be bought over the counter, at a pharmacy or online such as [employer].  This may help improve the penetration of the Penlac .  For the Penlac , apply to the affected toenail nightly to the whole nail surface, nail edges.  Wash this off thoroughly with rubbing alcohol  or nail polish remover once a week.

## 2024-05-07 ENCOUNTER — Telehealth (HOSPITAL_BASED_OUTPATIENT_CLINIC_OR_DEPARTMENT_OTHER): Payer: Self-pay

## 2024-05-07 ENCOUNTER — Telehealth: Payer: Self-pay

## 2024-05-07 ENCOUNTER — Encounter: Payer: Self-pay | Admitting: Cardiology

## 2024-05-07 ENCOUNTER — Ambulatory Visit: Attending: Family Medicine | Admitting: Physical Therapy

## 2024-05-07 NOTE — Telephone Encounter (Signed)
   Pre-operative Risk Assessment    Patient Name: Olivia Cruz  DOB: 1951/05/01 MRN: [id number]   Date of last office visit: 09/11/23 Date of next office visit: 07/24/24   Request for Surgical Clearance    Procedure:  joint replacement surgery  Date of Surgery:  Clearance TBD                                Surgeon:   Surgeon's Group or Practice Name:  [facility] Phone number:  [phone] Fax number:  [phone]   Type of Clearance Requested:   - Medical    Type of Anesthesia:  Not Indicated   Additional requests/questions:    SignedAnnabella LITTIE Sayres   05/07/2024, 1:47 PM

## 2024-05-07 NOTE — Telephone Encounter (Signed)
   Name: Olivia Cruz  DOB: Jul 08, 1951  MRN: [id number]  Primary Cardiologist: Jennifer JONELLE Crape, MD   Preoperative team, please contact this patient and set up a phone call appointment for further preoperative risk assessment. Please obtain consent and complete medication review. Thank you for your help.  I confirm that guidance regarding antiplatelet and oral anticoagulation therapy has been completed and, if necessary, noted below. (N/A)  I also confirmed the patient resides in the state of [place] . As per [facility] Medical Board telemedicine laws, the patient must reside in the state in which the provider is licensed.   Aleesa Sweigert E Coner Gibbard, NP 05/07/2024, 2:06 PM [facility]

## 2024-05-07 NOTE — Telephone Encounter (Signed)
 1st attempt to reach pt regarding surgical clearance and the need for an TELE appointment.  Left pt a detailed message to call back and get that scheduled.

## 2024-05-07 NOTE — Therapy (Incomplete)
 OUTPATIENT PHYSICAL THERAPY NEURO TREATMENT   Patient Name: Olivia Cruz MRN: [id number] DOB:[birth year], 73 y.o., female Today's Date: 05/07/2024   PCP: Vyvyan Sun, MD REFERRING PROVIDER: Arnett Repress, MD  END OF SESSION:    Past Medical History:  Diagnosis Date   Achilles tendonosis 10/06/2020   Acute cystitis with hematuria 09/29/2019   Acute infection of nasal sinus 06/21/2021   Allergic rhinoconjunctivitis    Anxiety    Asthma    Cardiac murmur 05/04/2022   Cataract    Cervical os stenosis 04/09/2015   Chest pain    Chest pain of uncertain etiology 05/04/2022   Chronic back pain 09/23/2021   Chronic knee pain after total replacement of right knee joint 07/21/2020   Chronic right shoulder pain 05/25/2021   Depression    Depression, major, recurrent, mild (HCC)    Diverticulitis    Dysuria 07/12/2021   GERD (gastroesophageal reflux disease)    Heart murmur    Hypercholesterolemia    Hypertension    Hypothyroidism    Ingrown nail 02/04/2021   Insomnia    Laryngopharyngeal reflux (LPR)    Memory changes    Migraine without aura and without status migrainosus, not intractable 05/07/2021   Migraines    Mild recurrent major depression (HCC) 07/12/2021   Mixed hyperlipidemia 12/22/2019   Muscle tension dysphonia 09/12/2018   Muscle wasting and atrophy, not elsewhere classified, left hand 12/01/2020   Nodule of finger of right hand 12/01/2020   Osteoarthritis    Osteopenia    Pain and swelling of right knee 12/25/2018   Postmenopausal atrophic vaginitis 04/09/2015   Prediabetes 12/22/2019   Psoriatic arthritis (HCC)    RBBB    RLS (restless legs syndrome)    Seasonal allergic rhinitis due to pollen 06/21/2021   Secondary hypothyroidism 12/22/2019   Skin cancer    Sleep apnea    Snoring 05/07/2021   Spondylolisthesis at L4-L5 level 04/27/2022   Status post total right knee replacement 12/29/2021   STD (sexually transmitted disease)    HSV II    Tendinopathy of right rotator cuff 05/25/2021   Tightness of heel cord, left 11/10/2020   Unilateral primary osteoarthritis, left knee 09/22/2022   Vocal fold atrophy 09/12/2018   Past Surgical History:  Procedure Laterality Date   BACK SURGERY  2024   fusion of L4 and L5   BELPHAROPTOSIS REPAIR     BREAST SURGERY Left    times 2   CARDIAC CATHETERIZATION  10/05/2015   COLONOSCOPY  06/03/2013   Moderate predominantly sigmoid diverticulosis. Small interal hemorroids   FOOT SURGERY Right    Removed bone spur   GANGLION CYST EXCISION Left    foot   HEMORRHOID SURGERY     SKIN CANCER EXCISION     TOTAL KNEE ARTHROPLASTY Right 07/21/2020   UPPER GI ENDOSCOPY  03/23/2016   Mild gastritis, gastric polyps bxbenign squamous mucosa with no abnormaility, fundic glad poly in setting of mild chron gastritis.   Patient Active Problem List   Diagnosis Date Noted   Heart murmur    Unilateral primary osteoarthritis, left knee 09/22/2022   Chest pain of uncertain etiology 05/04/2022   Cardiac murmur 05/04/2022   Allergic rhinoconjunctivitis 04/27/2022   Anxiety 04/27/2022   Asthma 04/27/2022   Chest pain 04/27/2022   Depression 04/27/2022   Depression, major, recurrent, mild (HCC) 04/27/2022   Diverticulitis 04/27/2022   GERD (gastroesophageal reflux disease) 04/27/2022   Glaucoma 04/27/2022   Hypercholesterolemia 04/27/2022   Hypothyroidism 04/27/2022  Insomnia 04/27/2022   Memory changes 04/27/2022   Migraines 04/27/2022   Osteoarthritis 04/27/2022   Osteopenia 04/27/2022   Skin cancer 04/27/2022   STD (sexually transmitted disease) 04/27/2022   Spondylolisthesis at L4-L5 level 04/27/2022   Status post total right knee replacement 12/29/2021   Mild recurrent major depression (HCC) 07/12/2021   Dysuria 07/12/2021   Acute infection of nasal sinus 06/21/2021   Seasonal allergic rhinitis due to pollen 06/21/2021   Chronic right shoulder pain 05/25/2021   Tendinopathy of right  rotator cuff 05/25/2021   Migraine without aura and without status migrainosus, not intractable 05/07/2021   Snoring 05/07/2021   Ingrown nail 02/04/2021   Muscle wasting and atrophy, not elsewhere classified, left hand 12/01/2020   Nodule of finger of right hand 12/01/2020   Tightness of heel cord, left 11/10/2020   Achilles tendonosis 10/06/2020   Chronic knee pain after total replacement of right knee joint 07/21/2020   Secondary hypothyroidism 12/22/2019   Prediabetes 12/22/2019   Mixed hyperlipidemia 12/22/2019   RLS (restless legs syndrome) 12/22/2019   Psoriatic arthritis (HCC) 10/28/2019   Acute cystitis with hematuria 09/29/2019   Pain and swelling of right knee 12/25/2018   Laryngopharyngeal reflux (LPR) 09/12/2018   Vocal fold atrophy 09/12/2018   Muscle tension dysphonia 09/12/2018   Cervical os stenosis 04/09/2015   Postmenopausal atrophic vaginitis 04/09/2015    ONSET DATE: 03/04/24 referral   REFERRING DIAG:  R26.89 (ICD-10-CM) - Other abnormalities of gait and mobility  Z91.81 (ICD-10-CM) - History of falling    THERAPY DIAG:  No diagnosis found.  Rationale for Evaluation and Treatment: Rehabilitation  SUBJECTIVE:                                                                                                                                                                                             SUBJECTIVE STATEMENT: Patient arrives to clinic with [facility]. Reports no falls. Does have intermittent L knee pain. Not using SPC in the house.   *** Pt accompanied by: self  PERTINENT HISTORY: achilles tendonosis, anxiety, chronic LBP, glaucoma, GERD, HLD, hypothyroidism, psoriatic arthritis, RLS, skin CA, spondylolisthesis L4-L5  PAIN:  Are you having pain? No  PRECAUTIONS: Fall  PATIENT GOALS: to have better balance  OBJECTIVE:  Note: Objective measures were completed at Evaluation unless otherwise noted.  DIAGNOSTIC FINDINGS: 08/04/22 lumbar  MRI IMPRESSION: 1. Lumbar spine spondylosis as described above. 2. No acute osseous injury of the lumbar spine.  TREATMENT  NMR: -scifit hills level 3 x10 mins for neural priming  -initial HEP (see below)  -230' carrying 15lb Surge  -deadlift surge x12   ***   PATIENT EDUCATION: Education details: initial HEP, impact on posture on balance and joint replacement on proprioception***  Person educated: Patient Education method: Explanation, Demonstration, and Handouts Education comprehension: verbalized understanding and needs further education  HOME EXERCISE PROGRAM: Access Code: [email or web address] URL: [email or web address] Date: 04/18/2024 Prepared by: Delon Pop  Exercises - Corner Balance Feet Together With Eyes Open  - 1 x daily - 7 x weekly - 3 sets - 30s hold - Corner Balance Feet Together With Eyes Closed  - 1 x daily - 7 x weekly - 3 sets - 30s hold - Corner Balance Feet Together: Eyes Open With Head Turns  - 1 x daily - 7 x weekly - 3 sets - 30s hold - Corner Balance Feet Together: Eyes Closed With Head Turns  - 1 x daily - 7 x weekly - 3 sets - 30s hold - Semi-Tandem Corner Balance With Eyes Open  - 1 x daily - 7 x weekly - 3 sets - 30s hold - Semi-Tandem Corner Balance With Eyes Closed  - 1 x daily - 7 x weekly - 3 sets - 30s hold - Single Leg Balance with Opposite Leg Star Reach  - 1 x daily - 7 x weekly - 3 sets - 10 reps  GOALS: Goals reviewed with patient? Yes  SHORT TERM GOALS: 05/03/24***  1. Pt will be independent with initial HEP for improved functional strength and balance  Baseline: to be provided Goal status: INITIAL    LONG TERM GOALS: Target date: 05/31/24  Pt will be independent with final HEP for improved functional strength and balance  Baseline: to be provided Goal status: INITIAL  2.  Pt will improve FGA to  >/= 19/30 to demonstrate improved balance and reduced fall risk Baseline: 14/30 Goal status: INITIAL  3.  Patient will be able to complete >/= 30s on condition 4 of MCTSIB to demonstrate improved balance Baseline: 17s Goal status: INITIAL  4.  Patient will score >/=80% on the ABC questionnaire to demonstrate improved confidence that she will not lose her balance with everyday tasks Baseline: 62% Goal status: INITIAL   ASSESSMENT:  CLINICAL IMPRESSION: Patient seen for skilled PT session with emphasis on prescribing initial HEP. Tolerated balance on solid ground well with appropriate ankle strategy to regain mild LOB. Most challenged by semi-tandem L foot anterior EC. Surge used for increased core engagement as well as providing mild perturbations to gait. Continue POC.   Emphasis of skilled PT session*** Continue POC.    OBJECTIVE IMPAIRMENTS: Abnormal gait, decreased balance, decreased knowledge of condition, decreased knowledge of use of DME, decreased strength, and pain.   ACTIVITY LIMITATIONS: carrying, lifting, bending, squatting, stairs, locomotion level, and caring for others  PARTICIPATION LIMITATIONS: meal prep, cleaning, interpersonal relationship, driving, shopping, community activity, and occupation  PERSONAL FACTORS: Age, Fitness, Past/current experiences, and Time since onset of injury/illness/exacerbation are also affecting patient's functional outcome.   REHAB POTENTIAL: Good  CLINICAL DECISION MAKING: Stable/uncomplicated  EVALUATION COMPLEXITY: Low  PLAN:  PT FREQUENCY: 2x/week  PT DURATION: 4 weeks  PLANNED INTERVENTIONS: 97164- PT Re-evaluation, 97750- Physical Performance Testing, 97110-Therapeutic exercises, 97530- Therapeutic activity, [id number]- Neuromuscular re-education, 97535- Self Care, [id number]- Manual therapy, [id number]- Gait training, [phone]- Orthotic Initial, [phone]- Orthotic/Prosthetic subsequent, [phone]- Aquatic Therapy, [phone]- Electrical stimulation  (manual), Patient/Family education, Balance training, Stair training, Vestibular  training, Visual/preceptual remediation/compensation, DME instructions, Cryotherapy, and Moist heat  PLAN FOR NEXT SESSION:  EO/EC, head turns, compliant surfaces, resisted gait***   Waddell Southgate, PT Waddell Southgate, PT, DPT, CSRS   05/07/2024, 7:53 AM

## 2024-05-07 NOTE — Telephone Encounter (Signed)
 Called patient to set up a televisit appt for pre-op clearance on 05/15/24 @9 :00. Meds, Rec, and Consent done.

## 2024-05-07 NOTE — Telephone Encounter (Signed)
 Pt returning call to sch phone preop visit   Best number [phone]

## 2024-05-07 NOTE — Telephone Encounter (Signed)
 Called patient to set up a televisit appt for pre-op clearance on 05/15/24 @9 :00. Meds, Rec, and Consent done.        Patient Consent for Virtual Visit        Olivia Cruz has provided verbal consent on 05/07/2024 for a virtual visit (video or telephone).   CONSENT FOR VIRTUAL VISIT FOR:  Olivia Cruz  By participating in this virtual visit I agree to the following:  I hereby voluntarily request, consent and authorize [facility] and its employed or contracted physicians, physician assistants, nurse practitioners or other licensed health care professionals (the Practitioner), to provide me with telemedicine health care services (the "Services) as deemed necessary by the treating Practitioner. I acknowledge and consent to receive the Services by the Practitioner via telemedicine. I understand that the telemedicine visit will involve communicating with the Practitioner through live audiovisual communication technology and the disclosure of certain medical information by electronic transmission. I acknowledge that I have been given the opportunity to request an in-person assessment or other available alternative prior to the telemedicine visit and am voluntarily participating in the telemedicine visit.  I understand that I have the right to withhold or withdraw my consent to the use of telemedicine in the course of my care at any time, without affecting my right to future care or treatment, and that the Practitioner or I may terminate the telemedicine visit at any time. I understand that I have the right to inspect all information obtained and/or recorded in the course of the telemedicine visit and may receive copies of available information for a reasonable fee.  I understand that some of the potential risks of receiving the Services via telemedicine include:  Delay or interruption in medical evaluation due to technological equipment failure or disruption; Information  transmitted may not be sufficient (e.g. poor resolution of images) to allow for appropriate medical decision making by the Practitioner; and/or  In rare instances, security protocols could fail, causing a breach of personal health information.  Furthermore, I acknowledge that it is my responsibility to provide information about my medical history, conditions and care that is complete and accurate to the best of my ability. I acknowledge that Practitioner's advice, recommendations, and/or decision may be based on factors not within their control, such as incomplete or inaccurate data provided by me or distortions of diagnostic images or specimens that may result from electronic transmissions. I understand that the practice of medicine is not an exact science and that Practitioner makes no warranties or guarantees regarding treatment outcomes. I acknowledge that a copy of this consent can be made available to me via my patient portal [facility] MyChart), or I can request a printed copy by calling the office of  [facility].    I understand that my insurance will be billed for this visit.   I have read or had this consent read to me. I understand the contents of this consent, which adequately explains the benefits and risks of the Services being provided via telemedicine.  I have been provided ample opportunity to ask questions regarding this consent and the Services and have had my questions answered to my satisfaction. I give my informed consent for the services to be provided through the use of telemedicine in my medical care

## 2024-05-08 ENCOUNTER — Telehealth: Payer: Self-pay

## 2024-05-08 NOTE — Telephone Encounter (Addendum)
 PA request received for Ciclopirox  8% solution. PA was submitted through CoverMyMeds and waiting on response.  Randie Mate  (KeyBETHA BARNACLE) PA Case ID #: [id number] Rx #: [id number]

## 2024-05-09 ENCOUNTER — Ambulatory Visit: Admitting: Physical Therapy

## 2024-05-10 NOTE — Telephone Encounter (Signed)
 PA was approved 05/08/24-04/09/25

## 2024-05-14 ENCOUNTER — Ambulatory Visit: Attending: Family Medicine | Admitting: Physical Therapy

## 2024-05-14 DIAGNOSIS — R262 Difficulty in walking, not elsewhere classified: Secondary | ICD-10-CM | POA: Insufficient documentation

## 2024-05-14 DIAGNOSIS — M6281 Muscle weakness (generalized): Secondary | ICD-10-CM | POA: Diagnosis not present

## 2024-05-14 DIAGNOSIS — R2681 Unsteadiness on feet: Secondary | ICD-10-CM | POA: Insufficient documentation

## 2024-05-14 NOTE — Therapy (Signed)
 OUTPATIENT PHYSICAL THERAPY NEURO TREATMENT   Patient Name: Olivia Cruz MRN: [id number] DOB:1951/07/02, 73 y.o., female Today's Date: 05/14/2024   PCP: Vyvyan Sun, MD REFERRING PROVIDER: Arnett Repress, MD  END OF SESSION:  PT End of Session - 05/14/24 1416     Visit Number 3    Number of Visits 9    Date for Recertification  05/31/24    Authorization Type HTA    PT Start Time 1415   pt arrived late   PT Stop Time 1445    PT Time Calculation (min) 30 min    Equipment Utilized During Treatment Gait belt    Activity Tolerance Patient tolerated treatment well    Behavior During Therapy [facility] for tasks assessed/performed           Past Medical History:  Diagnosis Date   Achilles tendonosis 10/06/2020   Acute cystitis with hematuria 09/29/2019   Acute infection of nasal sinus 06/21/2021   Allergic rhinoconjunctivitis    Anxiety    Asthma    Cardiac murmur 05/04/2022   Cataract    Cervical os stenosis 04/09/2015   Chest pain    Chest pain of uncertain etiology 05/04/2022   Chronic back pain 09/23/2021   Chronic knee pain after total replacement of right knee joint 07/21/2020   Chronic right shoulder pain 05/25/2021   Depression    Depression, major, recurrent, mild    Diverticulitis    Dysuria 07/12/2021   GERD (gastroesophageal reflux disease)    Heart murmur    Hypercholesterolemia    Hypertension    Hypothyroidism    Ingrown nail 02/04/2021   Insomnia    Laryngopharyngeal reflux (LPR)    Memory changes    Migraine without aura and without status migrainosus, not intractable 05/07/2021   Migraines    Mild recurrent major depression 07/12/2021   Mixed hyperlipidemia 12/22/2019   Muscle tension dysphonia 09/12/2018   Muscle wasting and atrophy, not elsewhere classified, left hand 12/01/2020   Nodule of finger of right hand 12/01/2020   Osteoarthritis    Osteopenia    Pain and swelling of right knee 12/25/2018   Postmenopausal atrophic vaginitis  04/09/2015   Prediabetes 12/22/2019   Psoriatic arthritis (HCC)    RBBB    RLS (restless legs syndrome)    Seasonal allergic rhinitis due to pollen 06/21/2021   Secondary hypothyroidism 12/22/2019   Skin cancer    Sleep apnea    Snoring 05/07/2021   Spondylolisthesis at L4-L5 level 04/27/2022   Status post total right knee replacement 12/29/2021   STD (sexually transmitted disease)    HSV II   Tendinopathy of right rotator cuff 05/25/2021   Tightness of heel cord, left 11/10/2020   Unilateral primary osteoarthritis, left knee 09/22/2022   Vocal fold atrophy 09/12/2018   Past Surgical History:  Procedure Laterality Date   BACK SURGERY  2024   fusion of L4 and L5   BELPHAROPTOSIS REPAIR     BREAST SURGERY Left    times 2   CARDIAC CATHETERIZATION  10/05/2015   COLONOSCOPY  06/03/2013   Moderate predominantly sigmoid diverticulosis. Small interal hemorroids   FOOT SURGERY Right    Removed bone spur   GANGLION CYST EXCISION Left    foot   HEMORRHOID SURGERY     SKIN CANCER EXCISION     TOTAL KNEE ARTHROPLASTY Right 07/21/2020   UPPER GI ENDOSCOPY  03/23/2016   Mild gastritis, gastric polyps bxbenign squamous mucosa with no abnormaility, fundic glad poly in  setting of mild chron gastritis.   Patient Active Problem List   Diagnosis Date Noted   Heart murmur    Unilateral primary osteoarthritis, left knee 09/22/2022   Chest pain of uncertain etiology 05/04/2022   Cardiac murmur 05/04/2022   Allergic rhinoconjunctivitis 04/27/2022   Anxiety 04/27/2022   Asthma 04/27/2022   Chest pain 04/27/2022   Depression 04/27/2022   Depression, major, recurrent, mild 04/27/2022   Diverticulitis 04/27/2022   GERD (gastroesophageal reflux disease) 04/27/2022   Glaucoma 04/27/2022   Hypercholesterolemia 04/27/2022   Hypothyroidism 04/27/2022   Insomnia 04/27/2022   Memory changes 04/27/2022   Migraines 04/27/2022   Osteoarthritis 04/27/2022   Osteopenia 04/27/2022   Skin cancer  04/27/2022   STD (sexually transmitted disease) 04/27/2022   Spondylolisthesis at L4-L5 level 04/27/2022   Status post total right knee replacement 12/29/2021   Mild recurrent major depression 07/12/2021   Dysuria 07/12/2021   Acute infection of nasal sinus 06/21/2021   Seasonal allergic rhinitis due to pollen 06/21/2021   Chronic right shoulder pain 05/25/2021   Tendinopathy of right rotator cuff 05/25/2021   Migraine without aura and without status migrainosus, not intractable 05/07/2021   Snoring 05/07/2021   Ingrown nail 02/04/2021   Muscle wasting and atrophy, not elsewhere classified, left hand 12/01/2020   Nodule of finger of right hand 12/01/2020   Tightness of heel cord, left 11/10/2020   Achilles tendonosis 10/06/2020   Chronic knee pain after total replacement of right knee joint 07/21/2020   Secondary hypothyroidism 12/22/2019   Prediabetes 12/22/2019   Mixed hyperlipidemia 12/22/2019   RLS (restless legs syndrome) 12/22/2019   Psoriatic arthritis (HCC) 10/28/2019   Acute cystitis with hematuria 09/29/2019   Pain and swelling of right knee 12/25/2018   Laryngopharyngeal reflux (LPR) 09/12/2018   Vocal fold atrophy 09/12/2018   Muscle tension dysphonia 09/12/2018   Cervical os stenosis 04/09/2015   Postmenopausal atrophic vaginitis 04/09/2015    ONSET DATE: 03/04/24 referral   REFERRING DIAG:  R26.89 (ICD-10-CM) - Other abnormalities of gait and mobility  Z91.81 (ICD-10-CM) - History of falling    THERAPY DIAG:  Difficulty in walking, not elsewhere classified  Muscle weakness (generalized)  Unsteadiness on feet  Rationale for Evaluation and Treatment: Rehabilitation  SUBJECTIVE:                                                                                                                                                                                             SUBJECTIVE STATEMENT:  Pt arrives to clinic with SPC. Reports no falls since last visit, did  have a near fall at [facility] but was able to  grab onto her husband's shirt to catch herself. Otherwise she went to the [employer] since last visit and was able to walk on the beach with no cane, had to navigate a lot of stairs at the house she was staying at and did ok with those.  She reports that she is going to have her L knee replaced, just has to have a tele-visit with Cardiology tomorrow to get cleared and will hopefully get a surgery date scheduled after that. She is pretty sure she is going to have to do prehab before her surgery and then PT 2-3x/week after her surgery, would prefer to do that closer to home. She should have updated information regarding surgery for her next PT visit.   Pt accompanied by: self  PERTINENT HISTORY: achilles tendonosis, anxiety, chronic LBP, glaucoma, GERD, HLD, hypothyroidism, psoriatic arthritis, RLS, skin CA, spondylolisthesis L4-L5  PAIN:  Are you having pain? No  PRECAUTIONS: Fall  PATIENT GOALS: to have better balance  OBJECTIVE:  Note: Objective measures were completed at Evaluation unless otherwise noted.  DIAGNOSTIC FINDINGS: 08/04/22 lumbar MRI IMPRESSION: 1. Lumbar spine spondylosis as described above. 2. No acute osseous injury of the lumbar spine.                                                                                                                              TREATMENT   NMR To work functional strengthening and reactive balance: With purple resistance band: 2 x 230 ft with steady resistance 2 x 230 ft with multidirectional perturbations Pt tends to use cross over stepping strategy with progression to stepping strategy for balance recovery Alt L/R gumdrop taps with purple band attached to ballet bar Most difficulty performing gumdrop taps on far R side due to LLE pain and weakness   PATIENT EDUCATION: Education details: continue HEP, plan to possibly add visits next session depending on her prehab schedule and  knee replacement surgery date Person educated: Patient Education method: Explanation and Demonstration Education comprehension: verbalized understanding and needs further education  HOME EXERCISE PROGRAM: Access Code: [email or web address] URL: [email or web address] Date: 04/18/2024 Prepared by: Delon Pop  Exercises - Corner Balance Feet Together With Eyes Open  - 1 x daily - 7 x weekly - 3 sets - 30s hold - Corner Balance Feet Together With Eyes Closed  - 1 x daily - 7 x weekly - 3 sets - 30s hold - Corner Balance Feet Together: Eyes Open With Head Turns  - 1 x daily - 7 x weekly - 3 sets - 30s hold - Corner Balance Feet Together: Eyes Closed With Head Turns  - 1 x daily - 7 x weekly - 3 sets - 30s hold - Semi-Tandem Corner Balance With Eyes Open  - 1 x daily - 7 x weekly - 3 sets - 30s hold - Semi-Tandem Corner Balance With Eyes Closed  - 1 x daily - 7 x weekly - 3 sets - 30s hold -  Single Leg Balance with Opposite Leg Star Reach  - 1 x daily - 7 x weekly - 3 sets - 10 reps  GOALS: Goals reviewed with patient? Yes  SHORT TERM GOALS: 05/03/24  1. Pt will be independent with initial HEP for improved functional strength and balance  Baseline: to be provided Goal status: INITIAL    LONG TERM GOALS: Target date: 05/31/24  Pt will be independent with final HEP for improved functional strength and balance  Baseline: to be provided Goal status: INITIAL  2.  Pt will improve FGA to >/= 19/30 to demonstrate improved balance and reduced fall risk Baseline: 14/30 Goal status: INITIAL  3.  Patient will be able to complete >/= 30s on condition 4 of MCTSIB to demonstrate improved balance Baseline: 17s Goal status: INITIAL  4.  Patient will score >/=80% on the ABC questionnaire to demonstrate improved confidence that she will not lose her balance with everyday tasks Baseline: 62% Goal status: INITIAL   ASSESSMENT:  CLINICAL IMPRESSION: Session limited by patient's late  arrival. Emphasis of skilled PT session on working on reactive balance, resisted gait, and SLS. Pt initially utilizes cross over stepping strategy for balance with progression to just stepping strategy for recovery. She continues to have difficulty with SLS on her LLE due to L knee joint pain and decreased strength in this limb. She continues to benefit from skilled PT services to work on improving her strength and her balance in order to reduce her fall risk. Continue POC.    OBJECTIVE IMPAIRMENTS: Abnormal gait, decreased balance, decreased knowledge of condition, decreased knowledge of use of DME, decreased strength, and pain.   ACTIVITY LIMITATIONS: carrying, lifting, bending, squatting, stairs, locomotion level, and caring for others  PARTICIPATION LIMITATIONS: meal prep, cleaning, interpersonal relationship, driving, shopping, community activity, and occupation  PERSONAL FACTORS: Age, Fitness, Past/current experiences, and Time since onset of injury/illness/exacerbation are also affecting patient's functional outcome.   REHAB POTENTIAL: Good  CLINICAL DECISION MAKING: Stable/uncomplicated  EVALUATION COMPLEXITY: Low  PLAN:  PT FREQUENCY: 2x/week  PT DURATION: 4 weeks  PLANNED INTERVENTIONS: [id number]- PT Re-evaluation, 97750- Physical Performance Testing, 97110-Therapeutic exercises, 97530- Therapeutic activity, [id number]- Neuromuscular re-education, 97535- Self Care, [id number]- Manual therapy, [id number]- Gait training, [phone]- Orthotic Initial, [phone]- Orthotic/Prosthetic subsequent, [phone]- Aquatic Therapy, [phone]- Electrical stimulation (manual), Patient/Family education, Balance training, Stair training, Vestibular training, Visual/preceptual remediation/compensation, DME instructions, Cryotherapy, and Moist heat  PLAN FOR NEXT SESSION:  assess STG, EO/EC, head turns, compliant surfaces, resisted gait, SLS, does pt have a date scheduled for her knee replacement? Add PT visits to work on balance  depending on her TKA schedule   Yalena Colon, PT W[place]ell [place], PT, DPT, CSRS   05/14/2024, 2:45 PM

## 2024-05-15 ENCOUNTER — Ambulatory Visit: Attending: Cardiology | Admitting: Emergency Medicine

## 2024-05-15 DIAGNOSIS — Z01810 Encounter for preprocedural cardiovascular examination: Secondary | ICD-10-CM

## 2024-05-15 NOTE — Progress Notes (Signed)
 Virtual Visit via Telephone Note   Because of Olivia Cruz co-morbid illnesses, she is at least at moderate risk for complications without adequate follow up.  This format is felt to be most appropriate for this patient at this time.  Due to technical limitations with video connection (technology), today's appointment will be conducted as an audio only telehealth visit, and [name] verbally agreed to proceed in this manner.   All issues noted in this document were discussed and addressed.  No physical exam could be performed with this format.  Evaluation Performed:  Preoperative cardiovascular risk assessment _____________   Date:  05/15/2024   Patient ID:  Olivia Cruz, DOB [birth year], MRN [id number] Patient Location:  Home Provider location:   Office  Primary Care Provider:  Sun, Vyvyan, MD Primary Cardiologist:  Jennifer JONELLE Crape, MD  Chief Complaint / Patient Profile   73 y.o. y/o female with a h/o migraines, GERD, hypothyroidism, psoriatic arthritis, and dyslipidemia who is pending joint replacement surgery on date TBD with [facility] and presents today for telephonic preoperative cardiovascular risk assessment.  History of Present Illness    Olivia Cruz is a 73 y.o. female who presents via audio/video conferencing for a telehealth visit today.  Pt was last seen in cardiology clinic on 09/11/2023 by Delon Hoover, NP.  At that time YALISSA FINK reported chest pains.  Coronary CTA was ordered. This test revealed a coronary calcium  score of 0 with no evidence of CAD. The patient is now pending procedure as outlined above. Since her last visit, she denies chest pain, shortness of breath, lower extremity edema, fatigue, palpitations, melena, hematuria, hemoptysis, diaphoresis, weakness, presyncope, syncope, orthopnea, and PND.  Today patient is doing well without acute cardiovascular  concerns.  She denies any chest pains or anginal symptoms.  She stays fairly active limited by her hip pain.  She does a stationary bike every day without exertional symptoms.  Overall able to complete greater than 4 METS.  Past Medical History    Past Medical History:  Diagnosis Date   Achilles tendonosis 10/06/2020   Acute cystitis with hematuria 09/29/2019   Acute infection of nasal sinus 06/21/2021   Allergic rhinoconjunctivitis    Anxiety    Asthma    Cardiac murmur 05/04/2022   Cataract    Cervical os stenosis 04/09/2015   Chest pain    Chest pain of uncertain etiology 05/04/2022   Chronic back pain 09/23/2021   Chronic knee pain after total replacement of right knee joint 07/21/2020   Chronic right shoulder pain 05/25/2021   Depression    Depression, major, recurrent, mild    Diverticulitis    Dysuria 07/12/2021   GERD (gastroesophageal reflux disease)    Heart murmur    Hypercholesterolemia    Hypertension    Hypothyroidism    Ingrown nail 02/04/2021   Insomnia    Laryngopharyngeal reflux (LPR)    Memory changes    Migraine without aura and without status migrainosus, not intractable 05/07/2021   Migraines    Mild recurrent major depression 07/12/2021   Mixed hyperlipidemia 12/22/2019   Muscle tension dysphonia 09/12/2018   Muscle wasting and atrophy, not elsewhere classified, left hand 12/01/2020   Nodule of finger of right hand 12/01/2020   Osteoarthritis    Osteopenia    Pain and swelling of right knee 12/25/2018   Postmenopausal atrophic vaginitis 04/09/2015   Prediabetes 12/22/2019   Psoriatic  arthritis (HCC)    RBBB    RLS (restless legs syndrome)    Seasonal allergic rhinitis due to pollen 06/21/2021   Secondary hypothyroidism 12/22/2019   Skin cancer    Sleep apnea    Snoring 05/07/2021   Spondylolisthesis at L4-L5 level 04/27/2022   Status post total right knee replacement 12/29/2021   STD (sexually transmitted disease)    HSV II    Tendinopathy of right rotator cuff 05/25/2021   Tightness of heel cord, left 11/10/2020   Unilateral primary osteoarthritis, left knee 09/22/2022   Vocal fold atrophy 09/12/2018   Past Surgical History:  Procedure Laterality Date   BACK SURGERY  2024   fusion of L4 and L5   BELPHAROPTOSIS REPAIR     BREAST SURGERY Left    times 2   CARDIAC CATHETERIZATION  10/05/2015   COLONOSCOPY  06/03/2013   Moderate predominantly sigmoid diverticulosis. Small interal hemorroids   FOOT SURGERY Right    Removed bone spur   GANGLION CYST EXCISION Left    foot   HEMORRHOID SURGERY     SKIN CANCER EXCISION     TOTAL KNEE ARTHROPLASTY Right 07/21/2020   UPPER GI ENDOSCOPY  03/23/2016   Mild gastritis, gastric polyps bxbenign squamous mucosa with no abnormaility, fundic glad poly in setting of mild chron gastritis.    Allergies  Allergies  Allergen Reactions   Codeine Anaphylaxis and Nausea Only   Ambien [Zolpidem Tartrate] Other (See Comments)    Memory loss per patient   Clonazepam  Other (See Comments)    Other Reaction(s): Fatique, Mental Status Changes   Cyproheptadine  Hcl Other (See Comments)    Confusion   Dilaudid  [Hydromorphone ] Other (See Comments)    hallucinations   Hydrocodone Other (See Comments)    anaphylaxis   Lisinopril Cough   Pramipexole  Nausea Only   Sulfa Antibiotics Other (See Comments)    hives   Tramadol Nausea And Vomiting    Home Medications    Prior to Admission medications   Medication Sig Start Date End Date Taking? Authorizing Provider  Adalimumab (HUMIRA PEN) 40 MG/0.4ML PNKT Inject 40 mg into the skin once a week. Unsure if dose is 40 mg- every other week    [provider]  ascorbic acid (VITAMIN C) 500 MG tablet Take 500 mg by mouth daily.    [provider]  beclomethasone (QVAR  REDIHALER) 40 MCG/ACT inhaler INHALE 2 INHALATIONS BY MOUTH 1  TO 2 TIMES DAILY TO PREVENT  COUGH OR WHEEZE. RINSE, GARGLE,  AND SPIT AFTER USE 08/07/23    Kozlow, Camellia PARAS, MD  Biotin w/ Vitamins C & E (HAIR/SKIN/NAILS PO) Take 1 tablet by mouth daily.    [provider]  budesonide  (RHINOCORT  AQUA) 32 MCG/ACT nasal spray Place 1 spray into both nostrils daily. Reported on 02/04/2016 06/21/21   Sherre Clapper, MD  CALCIUM  MAGNESIUM  ZINC PO Take by mouth.    [provider]  cetirizine  (ZYRTEC ) 10 MG tablet Take 1 tablet (10 mg total) by mouth daily as needed for allergies. 05/25/23   Kozlow, Camellia PARAS, MD  ciclopirox  (PENLAC ) 8 % solution Apply topically at bedtime. Apply over nail and surrounding skin. Apply daily over previous coat. After seven (7) days, may remove with alcohol  and continue cycle. 05/06/24   Lamount Ethan CROME, DPM  clindamycin (CLEOCIN) 300 MG capsule Take 300 mg by mouth as needed. AS NEEDED FOR DENTAL PROCEDURE    [provider]  Cyanocobalamin (VITAMIN B 12 PO) Take by  mouth daily.    [provider]  diclofenac  Sodium (VOLTAREN ) 1 % GEL Apply 1 Application topically as needed for pain (Knee pain). 10/06/20   [provider]  DULoxetine  (CYMBALTA ) 30 MG capsule 1 capsule 2 (two) times daily. 07/31/23   [provider]  famotidine  (PEPCID ) 40 MG tablet TAKE 1 TABLET BY MOUTH DAILY AT  BEDTIME Patient not taking: Reported on 05/06/2024 10/11/23   Kozlow, Eric J, MD  levothyroxine  (SYNTHROID ) 50 MCG tablet TAKE 1 TABLET BY MOUTH DAILY 10/18/21   CoxAbigail, MD  methocarbamol  (ROBAXIN ) 500 MG tablet Take 1 tablet (500 mg total) by mouth every 6 (six) hours as needed for muscle spasms. 01/31/23   Colon Shove, MD  Multiple Vitamin (MULTIVITAMIN WITH MINERALS) TABS tablet Take 1 tablet by mouth daily. Woman 50+    [provider]  nitrofurantoin  (MACRODANTIN ) 100 MG capsule Take 100 mg by mouth as needed (after  intercourse). 09/12/18   [provider]  nitroGLYCERIN  (NITROSTAT ) 0.4 MG SL tablet Place 1 tablet (0.4 mg total) under the tongue every 5 (five) minutes as needed for  chest pain. 05/04/22   Revankar, Jennifer SAUNDERS, MD  pantoprazole  (PROTONIX ) 40 MG tablet Take 1 tablet 1-2 times per day 05/25/23   Kozlow, Eric J, MD  Propylene Glycol, PF, (SYSTANE COMPLETE PF) 0.6 % SOLN Place 1 drop into both eyes daily as needed (dry eyes).    [provider]  terbinafine  (LAMISIL ) 250 MG tablet TAKE 1 TABLET BY MOUTH EVERY DAY Patient not taking: Reported on 05/06/2024 01/16/24   Lamount Ethan CROME, DPM  valACYclovir  (VALTREX ) 500 MG tablet Take 1 tablet (500 mg total) by mouth 3 (three) times daily as needed (herpes outbreak). 07/12/21   Sherre Abigail, MD    Physical Exam    Vital Signs:  Randie NOVAK Wieser does not have vital signs available for review today.  Given telephonic nature of communication, physical exam is limited. AAOx3. NAD. Normal affect.  Speech and respirations are unlabored.  Accessory Clinical Findings    None  Assessment & Plan    1.  Preoperative Cardiovascular Risk Assessment: According to the Revised Cardiac Risk Index (RCRI), her Perioperative Risk of Major Cardiac Event is (%): 0.4. Her Functional Capacity in METs is: 5.62 according to the Duke Activity Status Index (DASI).  Therefore, based on ACC/AHA guidelines, patient would be at acceptable risk for the planned procedure without further cardiovascular testing.  The patient was advised that if she develops new symptoms prior to surgery to contact our office to arrange for a follow-up visit, and she verbalized understanding.  A copy of this note will be routed to requesting surgeon.  Time:   Today, I have spent 7 minutes with the patient with telehealth technology discussing medical history, symptoms, and management plan.     Lum CROME Louis, NP  05/15/2024, 9:12 AM

## 2024-05-16 ENCOUNTER — Ambulatory Visit

## 2024-05-16 ENCOUNTER — Ambulatory Visit: Admitting: Allergy and Immunology

## 2024-05-16 ENCOUNTER — Ambulatory Visit: Admitting: Physical Therapy

## 2024-05-16 ENCOUNTER — Encounter: Payer: Self-pay | Admitting: Allergy and Immunology

## 2024-05-16 VITALS — BP 124/72 | HR 77 | Resp 16

## 2024-05-16 DIAGNOSIS — D849 Immunodeficiency, unspecified: Secondary | ICD-10-CM

## 2024-05-16 DIAGNOSIS — R262 Difficulty in walking, not elsewhere classified: Secondary | ICD-10-CM

## 2024-05-16 DIAGNOSIS — M6281 Muscle weakness (generalized): Secondary | ICD-10-CM

## 2024-05-16 DIAGNOSIS — K219 Gastro-esophageal reflux disease without esophagitis: Secondary | ICD-10-CM | POA: Diagnosis not present

## 2024-05-16 DIAGNOSIS — J4530 Mild persistent asthma, uncomplicated: Secondary | ICD-10-CM

## 2024-05-16 DIAGNOSIS — J3089 Other allergic rhinitis: Secondary | ICD-10-CM | POA: Diagnosis not present

## 2024-05-16 DIAGNOSIS — J988 Other specified respiratory disorders: Secondary | ICD-10-CM | POA: Diagnosis not present

## 2024-05-16 DIAGNOSIS — R2681 Unsteadiness on feet: Secondary | ICD-10-CM

## 2024-05-16 MED ORDER — METHYLPREDNISOLONE ACETATE 80 MG/ML IJ SUSP
80.0000 mg | Freq: Once | INTRAMUSCULAR | Status: AC
  Administered 2024-05-16: 80 mg via INTRAMUSCULAR

## 2024-05-16 MED ORDER — AZITHROMYCIN 500 MG PO TABS
500.0000 mg | ORAL_TABLET | Freq: Every day | ORAL | 0 refills | Status: AC
Start: 2024-05-16 — End: 2024-05-19

## 2024-05-16 MED ORDER — FAMOTIDINE 40 MG PO TABS: 40.0000 mg | ORAL_TABLET | Freq: Every day | ORAL | 1 refills | Status: AC

## 2024-05-16 NOTE — Patient Instructions (Addendum)
  1.  Treat and prevent inflammation:  A. Qvar  40 - 2 inhalations 1-2 times per day  B. Budesonide  - 2 sprays each nostril 3-7 times per week  2.  Treat and prevent reflux/LPR:  A. Pantoprazole  40 mg - 1 tablet 1-2 time per day B. Famotidine  40 mg - 1 tablet in evening if needed  3.  If needed:  A. Nasal saline B. OTC Cetirizine  10 mg - 1 tablet 1 time per day  C. Albuterol  HFA - 2 inhalations every 4-6 hours  4. For this recent event:   A. Depomedrol 80 IM delivered in clinic today  B. Azithromycin  500 mg - 1 tablet 1 time per day for 3 days  C. Use the combination of Pantoprazole  and famotidine    5.  Return in 6 months or earlier if problem.    6. Influenza = Tamiflu. Covid = Paxlovid

## 2024-05-16 NOTE — Therapy (Signed)
 OUTPATIENT PHYSICAL THERAPY NEURO TREATMENT - [facility]   Patient Name: Olivia Cruz MRN: [id number] DOB:07/24/1951, 73 y.o., female Today's Date: 05/16/2024   PCP: Vyvyan Sun, MD REFERRING PROVIDER: Arnett Repress, MD  END OF SESSION:  PT End of Session - 05/16/24 1118     Visit Number 4    Number of Visits 9    Date for Recertification  06/20/24   recert, to allow for scheduling delays   Authorization Type HTA    PT Start Time 1115   pt arrived late, got lost again   PT Stop Time 1145    PT Time Calculation (min) 30 min    Equipment Utilized During Treatment Gait belt    Activity Tolerance Patient tolerated treatment well    Behavior During Therapy [facility] for tasks assessed/performed           Past Medical History:  Diagnosis Date   Achilles tendonosis 10/06/2020   Acute cystitis with hematuria 09/29/2019   Acute infection of nasal sinus 06/21/2021   Allergic rhinoconjunctivitis    Anxiety    Asthma    Cardiac murmur 05/04/2022   Cataract    Cervical os stenosis 04/09/2015   Chest pain    Chest pain of uncertain etiology 05/04/2022   Chronic back pain 09/23/2021   Chronic knee pain after total replacement of right knee joint 07/21/2020   Chronic right shoulder pain 05/25/2021   Depression    Depression, major, recurrent, mild    Diverticulitis    Dysuria 07/12/2021   GERD (gastroesophageal reflux disease)    Heart murmur    Hypercholesterolemia    Hypertension    Hypothyroidism    Ingrown nail 02/04/2021   Insomnia    Laryngopharyngeal reflux (LPR)    Memory changes    Migraine without aura and without status migrainosus, not intractable 05/07/2021   Migraines    Mild recurrent major depression 07/12/2021   Mixed hyperlipidemia 12/22/2019   Muscle tension dysphonia 09/12/2018   Muscle wasting and atrophy, not elsewhere classified, left hand 12/01/2020   Nodule of finger of right hand 12/01/2020   Osteoarthritis    Osteopenia    Pain and swelling of  right knee 12/25/2018   Postmenopausal atrophic vaginitis 04/09/2015   Prediabetes 12/22/2019   Psoriatic arthritis (HCC)    RBBB    RLS (restless legs syndrome)    Seasonal allergic rhinitis due to pollen 06/21/2021   Secondary hypothyroidism 12/22/2019   Skin cancer    Sleep apnea    Snoring 05/07/2021   Spondylolisthesis at L4-L5 level 04/27/2022   Status post total right knee replacement 12/29/2021   STD (sexually transmitted disease)    HSV II   Tendinopathy of right rotator cuff 05/25/2021   Tightness of heel cord, left 11/10/2020   Unilateral primary osteoarthritis, left knee 09/22/2022   Vocal fold atrophy 09/12/2018   Past Surgical History:  Procedure Laterality Date   BACK SURGERY  2024   fusion of L4 and L5   BELPHAROPTOSIS REPAIR     BREAST SURGERY Left    times 2   CARDIAC CATHETERIZATION  10/05/2015   COLONOSCOPY  06/03/2013   Moderate predominantly sigmoid diverticulosis. Small interal hemorroids   FOOT SURGERY Right    Removed bone spur   GANGLION CYST EXCISION Left    foot   HEMORRHOID SURGERY     SKIN CANCER EXCISION     TOTAL KNEE ARTHROPLASTY Right 07/21/2020   UPPER GI ENDOSCOPY  03/23/2016   Mild gastritis,  gastric polyps bxbenign squamous mucosa with no abnormaility, fundic glad poly in setting of mild chron gastritis.   Patient Active Problem List   Diagnosis Date Noted   Heart murmur    Unilateral primary osteoarthritis, left knee 09/22/2022   Chest pain of uncertain etiology 05/04/2022   Cardiac murmur 05/04/2022   Allergic rhinoconjunctivitis 04/27/2022   Anxiety 04/27/2022   Asthma 04/27/2022   Chest pain 04/27/2022   Depression 04/27/2022   Depression, major, recurrent, mild 04/27/2022   Diverticulitis 04/27/2022   GERD (gastroesophageal reflux disease) 04/27/2022   Glaucoma 04/27/2022   Hypercholesterolemia 04/27/2022   Hypothyroidism 04/27/2022   Insomnia 04/27/2022   Memory changes 04/27/2022   Migraines 04/27/2022    Osteoarthritis 04/27/2022   Osteopenia 04/27/2022   Skin cancer 04/27/2022   STD (sexually transmitted disease) 04/27/2022   Spondylolisthesis at L4-L5 level 04/27/2022   Status post total right knee replacement 12/29/2021   Mild recurrent major depression 07/12/2021   Dysuria 07/12/2021   Acute infection of nasal sinus 06/21/2021   Seasonal allergic rhinitis due to pollen 06/21/2021   Chronic right shoulder pain 05/25/2021   Tendinopathy of right rotator cuff 05/25/2021   Migraine without aura and without status migrainosus, not intractable 05/07/2021   Snoring 05/07/2021   Ingrown nail 02/04/2021   Muscle wasting and atrophy, not elsewhere classified, left hand 12/01/2020   Nodule of finger of right hand 12/01/2020   Tightness of heel cord, left 11/10/2020   Achilles tendonosis 10/06/2020   Chronic knee pain after total replacement of right knee joint 07/21/2020   Secondary hypothyroidism 12/22/2019   Prediabetes 12/22/2019   Mixed hyperlipidemia 12/22/2019   RLS (restless legs syndrome) 12/22/2019   Psoriatic arthritis (HCC) 10/28/2019   Acute cystitis with hematuria 09/29/2019   Pain and swelling of right knee 12/25/2018   Laryngopharyngeal reflux (LPR) 09/12/2018   Vocal fold atrophy 09/12/2018   Muscle tension dysphonia 09/12/2018   Cervical os stenosis 04/09/2015   Postmenopausal atrophic vaginitis 04/09/2015    ONSET DATE: 03/04/24 referral   REFERRING DIAG:  R26.89 (ICD-10-CM) - Other abnormalities of gait and mobility  Z91.81 (ICD-10-CM) - History of falling    THERAPY DIAG:  Difficulty in walking, not elsewhere classified  Muscle weakness (generalized)  Unsteadiness on feet  Rationale for Evaluation and Treatment: Rehabilitation  SUBJECTIVE:                                                                                                                                                                                             SUBJECTIVE STATEMENT:  Pt  denies any falls, got lost of her way again here.  Her L knee remains sore and achy.  Pt got cleared by cardiology for her TKA, scheduled on 06/17/24. She will likely do a few PT visits of pre-hab and then anticipates 2-3/x week of PT following her surgery, is wanting to attend the clinic closer to her home for this.  She reports that her current HEP is going well.   Pt accompanied by: self  PERTINENT HISTORY: achilles tendonosis, anxiety, chronic LBP, glaucoma, GERD, HLD, hypothyroidism, psoriatic arthritis, RLS, skin CA, spondylolisthesis L4-L5  PAIN:  Are you having pain? No  PRECAUTIONS: Fall  PATIENT GOALS: to have better balance  OBJECTIVE:  Note: Objective measures were completed at Evaluation unless otherwise noted.  DIAGNOSTIC FINDINGS: 08/04/22 lumbar MRI IMPRESSION: 1. Lumbar spine spondylosis as described above. 2. No acute osseous injury of the lumbar spine.                                                                                                                              TREATMENT   NMR To work functional strengthening and reactive balance in // bars: Lateral sidestepping along foam beam  3 x 10 ft L/R, intermittent UE support In // bars standing on airex with no UE support: Alt L/R gumdrop taps 3 x 10 reps B Increased stability with each set of exercise Lateral sidestepping with red TB with alt L/R gumdrop taps 3 x 10 ft L/R   PATIENT EDUCATION: Education details: continue HEP, add visits leading up to her TKA Person educated: Patient Education method: Medical illustrator Education comprehension: verbalized understanding and needs further education  HOME EXERCISE PROGRAM: Access Code: [email or web address] URL: [email or web address] Date: 04/18/2024 Prepared by: Delon Pop  Exercises - Corner Balance Feet Together With Eyes Open  - 1 x daily - 7 x weekly - 3 sets - 30s hold - Corner Balance Feet Together With Eyes Closed   - 1 x daily - 7 x weekly - 3 sets - 30s hold - Corner Balance Feet Together: Eyes Open With Head Turns  - 1 x daily - 7 x weekly - 3 sets - 30s hold - Corner Balance Feet Together: Eyes Closed With Head Turns  - 1 x daily - 7 x weekly - 3 sets - 30s hold - Semi-Tandem Corner Balance With Eyes Open  - 1 x daily - 7 x weekly - 3 sets - 30s hold - Semi-Tandem Corner Balance With Eyes Closed  - 1 x daily - 7 x weekly - 3 sets - 30s hold - Single Leg Balance with Opposite Leg Star Reach  - 1 x daily - 7 x weekly - 3 sets - 10 reps  GOALS: Goals reviewed with patient? Yes  SHORT TERM GOALS: 05/03/24  1. Pt will be independent with initial HEP for improved functional strength and balance  Baseline: to be provided Goal status: MET   LONG TERM GOALS: Target date: 05/31/24  Pt will be independent with final HEP for improved  functional strength and balance  Baseline: to be provided Goal status: INITIAL  2.  Pt will improve FGA to >/= 19/30 to demonstrate improved balance and reduced fall risk Baseline: 14/30 Goal status: INITIAL  3.  Patient will be able to complete >/= 30s on condition 4 of MCTSIB to demonstrate improved balance Baseline: 17s Goal status: INITIAL  4.  Patient will score >/=80% on the ABC questionnaire to demonstrate improved confidence that she will not lose her balance with everyday tasks Baseline: 62% Goal status: INITIAL  NEW SHORT TERM GOALS=LONG TERM GOALS due to length of POC   NEW LONG TERM GOALS:  Target date: 06/13/2024   Pt will be independent with final HEP for improved functional strength and balance  Baseline: to be provided Goal status: INITIAL  2.  Pt will improve FGA to >/= 19/30 to demonstrate improved balance and reduced fall risk Baseline: 14/30 Goal status: INITIAL  3.  Patient will be able to complete >/= 30s on condition 4 of MCTSIB to demonstrate improved balance Baseline: 17s Goal status: INITIAL  4.  Patient will score >/=80% on the  ABC questionnaire to demonstrate improved confidence that she will not lose her balance with everyday tasks Baseline: 62% Goal status: INITIAL    ASSESSMENT:  CLINICAL IMPRESSION: Session limited by patient's late arrival. Emphasis of skilled PT session on conitnuing to work on SLS stability, stance on compliant surfaces, and functional LE strengthening She continues to struggle with SLS on her LLE as compared to her RLE. She continues to benefit from skilled PT services to work on improving her strength and her balance in order to reduce her fall risk. Pt has met 1/1 STG due to being independent with her initial HEP. Extended date for LTG with recertification of PT services this date, added visits to POC to cover multiple missed visits by patient. Continue POC.    OBJECTIVE IMPAIRMENTS: Abnormal gait, decreased balance, decreased knowledge of condition, decreased knowledge of use of DME, decreased strength, and pain.   ACTIVITY LIMITATIONS: carrying, lifting, bending, squatting, stairs, locomotion level, and caring for others  PARTICIPATION LIMITATIONS: meal prep, cleaning, interpersonal relationship, driving, shopping, community activity, and occupation  PERSONAL FACTORS: Age, Fitness, Past/current experiences, and Time since onset of injury/illness/exacerbation are also affecting patient's functional outcome.   REHAB POTENTIAL: Good  CLINICAL DECISION MAKING: Stable/uncomplicated  EVALUATION COMPLEXITY: Low  PLAN:  PT FREQUENCY: 1-2x/week  PT DURATION: 4 weeks + 4 weeks (recert)  PLANNED INTERVENTIONS: 02835- PT Re-evaluation, 97750- Physical Performance Testing, 97110-Therapeutic exercises, 97530- Therapeutic activity, [id number]- Neuromuscular re-education, 97535- Self Care, 02859- Manual therapy, [id number]- Gait training, [phone]- Orthotic Initial, [phone]- Orthotic/Prosthetic subsequent, [phone]- Aquatic Therapy, [phone]- Electrical stimulation (manual), Patient/Family education, Balance  training, Stair training, Vestibular training, Visual/preceptual remediation/compensation, DME instructions, Cryotherapy, and Moist heat  PLAN FOR NEXT SESSION: EO/EC, head turns, compliant surfaces, resisted gait, SLS especially on LLE   Waddell Southgate, PT Waddell Southgate, PT, DPT, CSRS   05/16/2024, 12:43 PM

## 2024-05-16 NOTE — Progress Notes (Signed)
 [place] [facility] [place] [facility] [place] [facility] [place] [facility]   Follow-up Note  Referring Provider: Sun, Vyvyan, MD Primary Provider: Sun, Vyvyan, MD Date of Office Visit: 05/16/2024  Subjective:   Olivia Cruz (DOB: 1951/06/22) is a 73 y.o. female who [facility] in re-evaluation of the following:  HPI: Alta returns to this clinic in evaluation of asthma, allergic rhinitis, LPR, and immunosuppression with Humira.  I last saw her in this clinic 31 August 2023.  She was really doing very well with both her upper and lower airway and her reflux issue while consistently using a relatively low dose of inhaled steroids and nasal steroids and continuing to use pantoprazole  1 or 2 times per day.  She did not require systemic steroid or an antibiotic for any type of airway issue.  She did not require any additional famotidine .  Unfortunately, almost a month ago while vacationing in [place]  she developed a sore throat and runny nose and a cough as did several other members of the family in [place] .  She resolved her throat issue and she resolved her nasal issue but she cannot resolve her cough.  And she is making some green mucus production.  She does not have any anosmia and she does not have any associated systemic or constitutional symptoms or fever.  Allergies as of 05/16/2024       Reactions   Codeine Anaphylaxis, Nausea Only   Ambien [zolpidem Tartrate] Other (See Comments)   Memory loss per patient   Clonazepam  Other (See Comments)   Other Reaction(s): Fatique, Mental Status Changes   Cyproheptadine  Hcl Other (See Comments)   Confusion   Dilaudid  [hydromorphone ] Other (See Comments)   hallucinations   Hydrocodone Other (See Comments)   anaphylaxis   Lisinopril Cough   Pramipexole  Nausea Only   Sulfa Antibiotics Other (See Comments)   hives   Tramadol Nausea And Vomiting        Medication List    ascorbic acid 500 MG  tablet Commonly known as: VITAMIN C Take 500 mg by mouth daily.   azithromycin  500 MG tablet Commonly known as: Zithromax  Take 1 tablet (500 mg total) by mouth daily for 3 days. Started by: Chavis Tessler J Rickiya Picariello   budesonide  32 MCG/ACT nasal spray Commonly known as: RHINOCORT  AQUA Place 1 spray into both nostrils daily. Reported on 02/04/2016   CALCIUM  MAGNESIUM  ZINC PO Take by mouth.   cetirizine  10 MG tablet Commonly known as: ZYRTEC  Take 1 tablet (10 mg total) by mouth daily as needed for allergies.   ciclopirox  8 % solution Commonly known as: PENLAC  Apply topically at bedtime. Apply over nail and surrounding skin. Apply daily over previous coat. After seven (7) days, may remove with alcohol  and continue cycle.   clindamycin 300 MG capsule Commonly known as: CLEOCIN Take 300 mg by mouth as needed. AS NEEDED FOR DENTAL PROCEDURE   diclofenac  Sodium 1 % Gel Commonly known as: VOLTAREN  Apply 1 Application topically as needed for pain (Knee pain).   DULoxetine  30 MG capsule Commonly known as: CYMBALTA  1 capsule 2 (two) times daily.   famotidine  40 MG tablet Commonly known as: PEPCID  Take 1 tablet (40 mg total) by mouth at bedtime.   HAIR/SKIN/NAILS PO Take 1 tablet by mouth daily.   Humira (2 Pen) 40 MG/0.4ML pen Generic drug: adalimumab Inject 40 mg into the skin once a week. Unsure if dose is 40 mg- every other week   levothyroxine   50 MCG tablet Commonly known as: SYNTHROID  TAKE 1 TABLET BY MOUTH DAILY   methocarbamol  500 MG tablet Commonly known as: ROBAXIN  Take 1 tablet (500 mg total) by mouth every 6 (six) hours as needed for muscle spasms.   multivitamin with minerals Tabs tablet Take 1 tablet by mouth daily. Woman 50+   nitrofurantoin  100 MG capsule Commonly known as: MACRODANTIN  Take 100 mg by mouth as needed (after  intercourse).   nitroGLYCERIN  0.4 MG SL tablet Commonly known as: NITROSTAT  Place 1 tablet (0.4 mg total) under the tongue every 5 (five)  minutes as needed for chest pain.   pantoprazole  40 MG tablet Commonly known as: PROTONIX  Take 1 tablet 1-2 times per day   Qvar  RediHaler 40 MCG/ACT inhaler Generic drug: beclomethasone INHALE 2 INHALATIONS BY MOUTH 1  TO 2 TIMES DAILY TO PREVENT  COUGH OR WHEEZE. RINSE, GARGLE,  AND SPIT AFTER USE   Systane Complete PF 0.6 % Soln Generic drug: Propylene Glycol (PF) Place 1 drop into both eyes daily as needed (dry eyes).   valACYclovir  500 MG tablet Commonly known as: VALTREX  Take 1 tablet (500 mg total) by mouth 3 (three) times daily as needed (herpes outbreak).   VITAMIN B 12 PO Take by mouth daily.    Past Medical History:  Diagnosis Date   Achilles tendonosis 10/06/2020   Acute cystitis with hematuria 09/29/2019   Acute infection of nasal sinus 06/21/2021   Allergic rhinoconjunctivitis    Anxiety    Asthma    Cardiac murmur 05/04/2022   Cataract    Cervical os stenosis 04/09/2015   Chest pain    Chest pain of uncertain etiology 05/04/2022   Chronic back pain 09/23/2021   Chronic knee pain after total replacement of right knee joint 07/21/2020   Chronic right shoulder pain 05/25/2021   Depression    Depression, major, recurrent, mild    Diverticulitis    Dysuria 07/12/2021   GERD (gastroesophageal reflux disease)    Heart murmur    Hypercholesterolemia    Hypertension    Hypothyroidism    Ingrown nail 02/04/2021   Insomnia    Laryngopharyngeal reflux (LPR)    Memory changes    Migraine without aura and without status migrainosus, not intractable 05/07/2021   Migraines    Mild recurrent major depression 07/12/2021   Mixed hyperlipidemia 12/22/2019   Muscle tension dysphonia 09/12/2018   Muscle wasting and atrophy, not elsewhere classified, left hand 12/01/2020   Nodule of finger of right hand 12/01/2020   Osteoarthritis    Osteopenia    Pain and swelling of right knee 12/25/2018   Postmenopausal atrophic vaginitis 04/09/2015   Prediabetes 12/22/2019    Psoriatic arthritis (HCC)    RBBB    RLS (restless legs syndrome)    Seasonal allergic rhinitis due to pollen 06/21/2021   Secondary hypothyroidism 12/22/2019   Skin cancer    Sleep apnea    Snoring 05/07/2021   Spondylolisthesis at L4-L5 level 04/27/2022   Status post total right knee replacement 12/29/2021   STD (sexually transmitted disease)    HSV II   Tendinopathy of right rotator cuff 05/25/2021   Tightness of heel cord, left 11/10/2020   Unilateral primary osteoarthritis, left knee 09/22/2022   Vocal fold atrophy 09/12/2018    Past Surgical History:  Procedure Laterality Date   BACK SURGERY  2024   fusion of L4 and L5   BELPHAROPTOSIS REPAIR     BREAST SURGERY Left    times 2  CARDIAC CATHETERIZATION  10/05/2015   COLONOSCOPY  06/03/2013   Moderate predominantly sigmoid diverticulosis. Small interal hemorroids   FOOT SURGERY Right    Removed bone spur   GANGLION CYST EXCISION Left    foot   HEMORRHOID SURGERY     SKIN CANCER EXCISION     TOTAL KNEE ARTHROPLASTY Right 07/21/2020   UPPER GI ENDOSCOPY  03/23/2016   Mild gastritis, gastric polyps bxbenign squamous mucosa with no abnormaility, fundic glad poly in setting of mild chron gastritis.    Review of systems negative except as noted in HPI / PMHx or noted below:  Review of Systems  Constitutional: Negative.   HENT: Negative.    Eyes: Negative.   Respiratory: Negative.    Cardiovascular: Negative.   Gastrointestinal: Negative.   Genitourinary: Negative.   Musculoskeletal: Negative.   Skin: Negative.   Neurological: Negative.   Endo/Heme/Allergies: Negative.   Psychiatric/Behavioral: Negative.       Objective:   Vitals:   05/16/24 1559  BP: 124/72  Pulse: 77  Resp: 16  SpO2: 99%          Physical Exam Constitutional:      Appearance: She is not diaphoretic.  HENT:     Head: Normocephalic.     Right Ear: Tympanic membrane, ear canal and external ear normal.     Left Ear: Tympanic  membrane, ear canal and external ear normal.     Nose: Nose normal. No mucosal edema or rhinorrhea.     Mouth/Throat:     Pharynx: Uvula midline. No oropharyngeal exudate.  Eyes:     Conjunctiva/sclera: Conjunctivae normal.  Neck:     Thyroid : No thyromegaly.     Trachea: Trachea normal. No tracheal tenderness or tracheal deviation.  Cardiovascular:     Rate and Rhythm: Normal rate and regular rhythm.     Heart sounds: Normal heart sounds, S1 normal and S2 normal. No murmur heard. Pulmonary:     Effort: No respiratory distress.     Breath sounds: Normal breath sounds. No stridor. No wheezing or rales.  Lymphadenopathy:     Head:     Right side of head: No tonsillar adenopathy.     Left side of head: No tonsillar adenopathy.     Cervical: No cervical adenopathy.  Skin:    Findings: No erythema or rash.     Nails: There is no clubbing.  Neurological:     Mental Status: She is alert.     Diagnostics: Spirometry was performed and demonstrated an FEV1 of 1.94 at 106 % of predicted.  Assessment and Plan:   1. Asthma, well controlled, mild persistent   2. Perennial allergic rhinitis   3. LPRD (laryngopharyngeal reflux disease)   4. Immunosuppression   5. Respiratory tract infection    1.  Treat and prevent inflammation:  A. Qvar  40 - 2 inhalations 1-2 times per day  B. Budesonide  - 2 sprays each nostril 3-7 times per week  2.  Treat and prevent reflux/LPR:  A. Pantoprazole  40 mg - 1 tablet 1-2 time per day B. Famotidine  40 mg - 1 tablet in evening if needed  3.  If needed:  A. Nasal saline B. OTC Cetirizine  10 mg - 1 tablet 1 time per day C. Albuterol  HFA - 2 inhalations every 4-6 hours  4. For this recent event:   A. Depomedrol 80 IM delivered in clinic today  B. Azithromycin  500 mg - 1 tablet 1 time per day for 3 days  C. Use the combination of Pantoprazole  and famotidine    5.  Return in 6 months or earlier if problem.    6. Influenza = Tamiflu. Covid =  Paxlovid  Dakisha appears to have developed some type of respiratory tract infection and and she is making some green sputum production and given the fact that she is immunosuppressed I am going to give her a broad-spectrum antibiotic including coverage for mycoplasma as well as some anti-inflammatory agents for airway in the form of a systemic steroid while she maintains therapy with her Qvar  and nasal budesonide .  Assuming she does well with this plan I will see her back in this clinic in 6 months or earlier if there is a problem.  Camellia Denis, MD Allergy / Immunology [facility]

## 2024-05-17 DIAGNOSIS — G4733 Obstructive sleep apnea (adult) (pediatric): Secondary | ICD-10-CM | POA: Diagnosis not present

## 2024-05-20 ENCOUNTER — Encounter: Payer: Self-pay | Admitting: Allergy and Immunology

## 2024-05-27 DIAGNOSIS — D3131 Benign neoplasm of right choroid: Secondary | ICD-10-CM | POA: Diagnosis not present

## 2024-05-31 DIAGNOSIS — G4733 Obstructive sleep apnea (adult) (pediatric): Secondary | ICD-10-CM | POA: Diagnosis not present

## 2024-05-31 DIAGNOSIS — Z2989 Encounter for other specified prophylactic measures: Secondary | ICD-10-CM | POA: Diagnosis not present

## 2024-05-31 DIAGNOSIS — Z23 Encounter for immunization: Secondary | ICD-10-CM | POA: Diagnosis not present

## 2024-06-03 ENCOUNTER — Ambulatory Visit: Payer: Self-pay

## 2024-06-03 ENCOUNTER — Ambulatory Visit: Attending: Family Medicine

## 2024-06-03 DIAGNOSIS — M6281 Muscle weakness (generalized): Secondary | ICD-10-CM | POA: Diagnosis not present

## 2024-06-03 DIAGNOSIS — R262 Difficulty in walking, not elsewhere classified: Secondary | ICD-10-CM | POA: Diagnosis not present

## 2024-06-03 DIAGNOSIS — R2681 Unsteadiness on feet: Secondary | ICD-10-CM | POA: Insufficient documentation

## 2024-06-03 NOTE — Therapy (Signed)
 OUTPATIENT PHYSICAL THERAPY NEURO TREATMENT/ DISCHARGE SUMMARY   Patient Name: Olivia Cruz MRN: [id number] DOB:1951-08-07, 73 y.o., female Today's Date: 06/03/2024  PHYSICAL THERAPY DISCHARGE SUMMARY  Visits from Start of Care: 5  Current functional level related to goals / functional outcomes: See below   Remaining deficits: See below   Education / Equipment: PT POC, exam findings, HEP, fall precautions   Patient agrees to discharge. Patient goals were partially met. Patient is being discharged due to being pleased with the current functional level.  PCP: Vyvyan Sun, MD REFERRING PROVIDER: Arnett Repress, MD  END OF SESSION:  PT End of Session - 06/03/24 1447     Visit Number 5    Number of Visits 9    Date for Recertification  06/20/24    Authorization Type HTA    PT Start Time 1446    PT Stop Time 1515    PT Time Calculation (min) 29 min    Equipment Utilized During Treatment Gait belt    Activity Tolerance Patient tolerated treatment well    Behavior During Therapy WFL for tasks assessed/performed           Past Medical History:  Diagnosis Date   Achilles tendonosis 10/06/2020   Acute cystitis with hematuria 09/29/2019   Acute infection of nasal sinus 06/21/2021   Allergic rhinoconjunctivitis    Anxiety    Asthma    Cardiac murmur 05/04/2022   Cataract    Cervical os stenosis 04/09/2015   Chest pain    Chest pain of uncertain etiology 05/04/2022   Chronic back pain 09/23/2021   Chronic knee pain after total replacement of right knee joint 07/21/2020   Chronic right shoulder pain 05/25/2021   Depression    Depression, major, recurrent, mild    Diverticulitis    Dysuria 07/12/2021   GERD (gastroesophageal reflux disease)    Heart murmur    Hypercholesterolemia    Hypertension    Hypothyroidism    Ingrown nail 02/04/2021   Insomnia    Laryngopharyngeal reflux (LPR)    Memory changes    Migraine without aura and without status migrainosus,  not intractable 05/07/2021   Migraines    Mild recurrent major depression 07/12/2021   Mixed hyperlipidemia 12/22/2019   Muscle tension dysphonia 09/12/2018   Muscle wasting and atrophy, not elsewhere classified, left hand 12/01/2020   Nodule of finger of right hand 12/01/2020   Osteoarthritis    Osteopenia    Pain and swelling of right knee 12/25/2018   Postmenopausal atrophic vaginitis 04/09/2015   Prediabetes 12/22/2019   Psoriatic arthritis (HCC)    RBBB    RLS (restless legs syndrome)    Seasonal allergic rhinitis due to pollen 06/21/2021   Secondary hypothyroidism 12/22/2019   Skin cancer    Sleep apnea    Snoring 05/07/2021   Spondylolisthesis at L4-L5 level 04/27/2022   Status post total right knee replacement 12/29/2021   STD (sexually transmitted disease)    HSV II   Tendinopathy of right rotator cuff 05/25/2021   Tightness of heel cord, left 11/10/2020   Unilateral primary osteoarthritis, left knee 09/22/2022   Vocal fold atrophy 09/12/2018   Past Surgical History:  Procedure Laterality Date   BACK SURGERY  2024   fusion of L4 and L5   BELPHAROPTOSIS REPAIR     BREAST SURGERY Left    times 2   CARDIAC CATHETERIZATION  10/05/2015   COLONOSCOPY  06/03/2013   Moderate predominantly sigmoid diverticulosis. Small interal hemorroids  FOOT SURGERY Right    Removed bone spur   GANGLION CYST EXCISION Left    foot   HEMORRHOID SURGERY     SKIN CANCER EXCISION     TOTAL KNEE ARTHROPLASTY Right 07/21/2020   UPPER GI ENDOSCOPY  03/23/2016   Mild gastritis, gastric polyps bxbenign squamous mucosa with no abnormaility, fundic glad poly in setting of mild chron gastritis.   Patient Active Problem List   Diagnosis Date Noted   Heart murmur    Unilateral primary osteoarthritis, left knee 09/22/2022   Chest pain of uncertain etiology 05/04/2022   Cardiac murmur 05/04/2022   Allergic rhinoconjunctivitis 04/27/2022   Anxiety 04/27/2022   Asthma 04/27/2022   Chest pain  04/27/2022   Depression 04/27/2022   Depression, major, recurrent, mild 04/27/2022   Diverticulitis 04/27/2022   GERD (gastroesophageal reflux disease) 04/27/2022   Glaucoma 04/27/2022   Hypercholesterolemia 04/27/2022   Hypothyroidism 04/27/2022   Insomnia 04/27/2022   Memory changes 04/27/2022   Migraines 04/27/2022   Osteoarthritis 04/27/2022   Osteopenia 04/27/2022   Skin cancer 04/27/2022   STD (sexually transmitted disease) 04/27/2022   Spondylolisthesis at L4-L5 level 04/27/2022   Status post total right knee replacement 12/29/2021   Mild recurrent major depression 07/12/2021   Dysuria 07/12/2021   Acute infection of nasal sinus 06/21/2021   Seasonal allergic rhinitis due to pollen 06/21/2021   Chronic right shoulder pain 05/25/2021   Tendinopathy of right rotator cuff 05/25/2021   Migraine without aura and without status migrainosus, not intractable 05/07/2021   Snoring 05/07/2021   Ingrown nail 02/04/2021   Muscle wasting and atrophy, not elsewhere classified, left hand 12/01/2020   Nodule of finger of right hand 12/01/2020   Tightness of heel cord, left 11/10/2020   Achilles tendonosis 10/06/2020   Chronic knee pain after total replacement of right knee joint 07/21/2020   Secondary hypothyroidism 12/22/2019   Prediabetes 12/22/2019   Mixed hyperlipidemia 12/22/2019   RLS (restless legs syndrome) 12/22/2019   Psoriatic arthritis (HCC) 10/28/2019   Acute cystitis with hematuria 09/29/2019   Pain and swelling of right knee 12/25/2018   Laryngopharyngeal reflux (LPR) 09/12/2018   Vocal fold atrophy 09/12/2018   Muscle tension dysphonia 09/12/2018   Cervical os stenosis 04/09/2015   Postmenopausal atrophic vaginitis 04/09/2015    ONSET DATE: 03/04/24 referral   REFERRING DIAG:  R26.89 (ICD-10-CM) - Other abnormalities of gait and mobility  Z91.81 (ICD-10-CM) - History of falling    THERAPY DIAG:  Difficulty in walking, not elsewhere classified  Muscle  weakness (generalized)  Unsteadiness on feet  Rationale for Evaluation and Treatment: Rehabilitation  SUBJECTIVE:  SUBJECTIVE STATEMENT: Patient arrives to clinic alone, no AD. Agreeable to DC. Denies falls.    Pt accompanied by: self  PERTINENT HISTORY: achilles tendonosis, anxiety, chronic LBP, glaucoma, GERD, HLD, hypothyroidism, psoriatic arthritis, RLS, skin CA, spondylolisthesis L4-L5  PAIN:  Are you having pain? No  PRECAUTIONS: Fall  PATIENT GOALS: to have better balance  OBJECTIVE:  Note: Objective measures were completed at Evaluation unless otherwise noted.  DIAGNOSTIC FINDINGS: 08/04/22 lumbar MRI IMPRESSION: 1. Lumbar spine spondylosis as described above. 2. No acute osseous injury of the lumbar spine.                                                                                                                              TREATMENT  Theract: -ABC: 81.25%  [facility] PT Assessment - 06/03/24 0001       Functional Gait  Assessment   Gait assessed  Yes    Gait Level Surface Walks 20 ft in less than 7 sec but greater than 5.5 sec, uses assistive device, slower speed, mild gait deviations, or deviates 6-10 in outside of the 12 in walkway width.    Change in Gait Speed Able to smoothly change walking speed without loss of balance or gait deviation. Deviate no more than 6 in outside of the 12 in walkway width.    Gait with Horizontal Head Turns Performs head turns smoothly with slight change in gait velocity (eg, minor disruption to smooth gait path), deviates 6-10 in outside 12 in walkway width, or uses an assistive device.    Gait with Vertical Head Turns Performs task with slight change in gait velocity (eg, minor disruption to smooth gait path), deviates 6 - 10 in outside 12 in  walkway width or uses assistive device    Gait and Pivot Turn Pivot turns safely within 3 sec and stops quickly with no loss of balance.    Step Over Obstacle Is able to step over one shoe box (4.5 in total height) without changing gait speed. No evidence of imbalance.    Gait with Narrow Base of Support Ambulates 4-7 steps.    Gait with Eyes Closed Walks 20 ft, uses assistive device, slower speed, mild gait deviations, deviates 6-10 in outside 12 in walkway width. Ambulates 20 ft in less than 9 sec but greater than 7 sec.    Ambulating Backwards Walks 20 ft, uses assistive device, slower speed, mild gait deviations, deviates 6-10 in outside 12 in walkway width.    Steps Alternating feet, must use rail.    Total Score 21             PATIENT EDUCATION: Education details: exam findings, PT POC, continue HEP Person educated: Patient Education method: Medical illustrator Education comprehension: verbalized understanding and needs further education  HOME EXERCISE PROGRAM: Access Code: [email or web address] URL: [email or web address] Date: 04/18/2024 Prepared by: Delon Pop  Exercises - Corner Balance Feet Together With Eyes Open  - 1 x daily -  7 x weekly - 3 sets - 30s hold - Corner Balance Feet Together With Eyes Closed  - 1 x daily - 7 x weekly - 3 sets - 30s hold - Corner Balance Feet Together: Eyes Open With Head Turns  - 1 x daily - 7 x weekly - 3 sets - 30s hold - Corner Balance Feet Together: Eyes Closed With Head Turns  - 1 x daily - 7 x weekly - 3 sets - 30s hold - Semi-Tandem Corner Balance With Eyes Open  - 1 x daily - 7 x weekly - 3 sets - 30s hold - Semi-Tandem Corner Balance With Eyes Closed  - 1 x daily - 7 x weekly - 3 sets - 30s hold - Single Leg Balance with Opposite Leg Star Reach  - 1 x daily - 7 x weekly - 3 sets - 10 reps  GOALS: Goals reviewed with patient? Yes  SHORT TERM GOALS: 05/03/24  1. Pt will be independent with initial HEP for improved  functional strength and balance  Baseline: to be provided Goal status: MET   LONG TERM GOALS: Target date: 05/31/24  Pt will be independent with final HEP for improved functional strength and balance  Baseline: to be provided; provided Goal status: MET  2.  Pt will improve FGA to >/= 19/30 to demonstrate improved balance and reduced fall risk Baseline: 14/30; 21/30 Goal status: MET  3.  Patient will be able to complete >/= 30s on condition 4 of MCTSIB to demonstrate improved balance Baseline: 17s; 17s Goal status: NOT MET  4.  Patient will score >/=80% on the ABC questionnaire to demonstrate improved confidence that she will not lose her balance with everyday tasks Baseline: 62%; 81.25% Goal status: MET  NEW SHORT TERM GOALS=LONG TERM GOALS due to length of POC   NEW LONG TERM GOALS:  Target date: 06/13/2024   Pt will be independent with final HEP for improved functional strength and balance  Baseline: to be provided; provided Goal status: MET  2.  Pt will improve FGA to >/= 19/30 to demonstrate improved balance and reduced fall risk Baseline: 14/30; 21/30 Goal status: MET  3.  Patient will be able to complete >/= 30s on condition 4 of MCTSIB to demonstrate improved balance Baseline: 17s; 17s Goal status: NOT MET  4.  Patient will score >/=80% on the ABC questionnaire to demonstrate improved confidence that she will not lose her balance with everyday tasks Baseline: 62%; 81.25% Goal status: MET    ASSESSMENT:  CLINICAL IMPRESSION: Patient seen for skilled PT session with emphasis on goal assessment and dc. She met 3/4 LTG. Activities-specific Balance Confidence Scale:  Score: 81.25% Increased risk of falls in community-dwelling, older adults <80% (79.89%)  0% = no confidence - 100% = complete confidence (ANPTA Core Set of Outcome Measures for Adults with Neurologic Conditions, 2018). Patient demonstrates increased fall risk as noted by score of 21/30 on   Functional Gait Assessment.   <22/30 = predictive of falls, <20/30 = fall in 6 months, <18/30 = predictive of falls in PD MCID: 5 points stroke population, 4 points geriatric population (ANPTA Core Set of Outcome Measures for Adults with Neurologic Conditions, 2018). Discussed expected decrease in balance s/p L TKA, but with rehab should hopefully rebound. Patient verbalized understanding and is agreeable to dc.      OBJECTIVE IMPAIRMENTS: Abnormal gait, decreased balance, decreased knowledge of condition, decreased knowledge of use of DME, decreased strength, and pain.   ACTIVITY LIMITATIONS: carrying,  lifting, bending, squatting, stairs, locomotion level, and caring for others  PARTICIPATION LIMITATIONS: meal prep, cleaning, interpersonal relationship, driving, shopping, community activity, and occupation  PERSONAL FACTORS: Age, Fitness, Past/current experiences, and Time since onset of injury/illness/exacerbation are also affecting patient's functional outcome.   REHAB POTENTIAL: Good  CLINICAL DECISION MAKING: Stable/uncomplicated  EVALUATION COMPLEXITY: Low  PLAN:  PT FREQUENCY: 1-2x/week  PT DURATION: 4 weeks + 4 weeks (recert)  PLANNED INTERVENTIONS: 02835- PT Re-evaluation, 97750- Physical Performance Testing, 97110-Therapeutic exercises, 97530- Therapeutic activity, [id number]- Neuromuscular re-education, 97535- Self Care, 02859- Manual therapy, [id number]- Gait training, [phone]- Orthotic Initial, [phone]- Orthotic/Prosthetic subsequent, [phone]- Aquatic Therapy, [phone]- Electrical stimulation (manual), Patient/Family education, Balance training, Stair training, Vestibular training, Visual/preceptual remediation/compensation, DME instructions, Cryotherapy, and Moist heat  PLAN FOR NEXT SESSION: dc from PT   Delon DELENA Pop, PT Delon DELENA Pop, PT, DPT, CBIS  06/03/2024, 3:20 PM

## 2024-06-05 ENCOUNTER — Ambulatory Visit: Payer: Self-pay

## 2024-06-06 DIAGNOSIS — R7303 Prediabetes: Secondary | ICD-10-CM | POA: Diagnosis not present

## 2024-06-06 DIAGNOSIS — E785 Hyperlipidemia, unspecified: Secondary | ICD-10-CM | POA: Diagnosis not present

## 2024-06-06 DIAGNOSIS — K219 Gastro-esophageal reflux disease without esophagitis: Secondary | ICD-10-CM | POA: Diagnosis not present

## 2024-06-06 DIAGNOSIS — J45998 Other asthma: Secondary | ICD-10-CM | POA: Diagnosis not present

## 2024-06-06 DIAGNOSIS — M1712 Unilateral primary osteoarthritis, left knee: Secondary | ICD-10-CM | POA: Diagnosis not present

## 2024-06-06 DIAGNOSIS — E039 Hypothyroidism, unspecified: Secondary | ICD-10-CM | POA: Diagnosis not present

## 2024-06-12 ENCOUNTER — Ambulatory Visit: Payer: Self-pay

## 2024-06-13 ENCOUNTER — Ambulatory Visit: Admitting: Podiatry

## 2024-06-13 DIAGNOSIS — M62552 Muscle wasting and atrophy, not elsewhere classified, left thigh: Secondary | ICD-10-CM | POA: Diagnosis not present

## 2024-06-13 DIAGNOSIS — M79674 Pain in right toe(s): Secondary | ICD-10-CM

## 2024-06-13 DIAGNOSIS — M25562 Pain in left knee: Secondary | ICD-10-CM | POA: Diagnosis not present

## 2024-06-13 DIAGNOSIS — R2689 Other abnormalities of gait and mobility: Secondary | ICD-10-CM | POA: Diagnosis not present

## 2024-06-13 NOTE — Progress Notes (Signed)
 Chief Complaint  Patient presents with   Ingrown Toenail    Right hallux, bilateral nail borders. Wants to make sure it is not infected due to having knee SX. knee SX is 10/27   HPI: 73 y.o. female presents today with c/o possible ingrown toenail to the right great toenail.  She wore a pair of shoes that put some pressure on the toenail, and she got worried since there was some residual pain.  She has a knee replacement scheduled for next week, and can't have any infection present.  She noticed some redness and called the office right away to be seen.  Past Medical History:  Diagnosis Date   Achilles tendonosis 10/06/2020   Acute cystitis with hematuria 09/29/2019   Acute infection of nasal sinus 06/21/2021   Allergic rhinoconjunctivitis    Anxiety    Asthma    Cardiac murmur 05/04/2022   Cataract    Cervical os stenosis 04/09/2015   Chest pain    Chest pain of uncertain etiology 05/04/2022   Chronic back pain 09/23/2021   Chronic knee pain after total replacement of right knee joint 07/21/2020   Chronic right shoulder pain 05/25/2021   Depression    Depression, major, recurrent, mild    Diverticulitis    Dysuria 07/12/2021   GERD (gastroesophageal reflux disease)    Heart murmur    Hypercholesterolemia    Hypertension    Hypothyroidism    Ingrown nail 02/04/2021   Insomnia    Laryngopharyngeal reflux (LPR)    Memory changes    Migraine without aura and without status migrainosus, not intractable 05/07/2021   Migraines    Mild recurrent major depression 07/12/2021   Mixed hyperlipidemia 12/22/2019   Muscle tension dysphonia 09/12/2018   Muscle wasting and atrophy, not elsewhere classified, left hand 12/01/2020   Nodule of finger of right hand 12/01/2020   Osteoarthritis    Osteopenia    Pain and swelling of right knee 12/25/2018   Postmenopausal atrophic vaginitis 04/09/2015   Prediabetes 12/22/2019   Psoriatic arthritis (HCC)    RBBB    RLS (restless legs  syndrome)    Seasonal allergic rhinitis due to pollen 06/21/2021   Secondary hypothyroidism 12/22/2019   Skin cancer    Sleep apnea    Snoring 05/07/2021   Spondylolisthesis at L4-L5 level 04/27/2022   Status post total right knee replacement 12/29/2021   STD (sexually transmitted disease)    HSV II   Tendinopathy of right rotator cuff 05/25/2021   Tightness of heel cord, left 11/10/2020   Unilateral primary osteoarthritis, left knee 09/22/2022   Vocal fold atrophy 09/12/2018   Past Surgical History:  Procedure Laterality Date   BACK SURGERY  2024   fusion of L4 and L5   BELPHAROPTOSIS REPAIR     BREAST SURGERY Left    times 2   CARDIAC CATHETERIZATION  10/05/2015   COLONOSCOPY  06/03/2013   Moderate predominantly sigmoid diverticulosis. Small interal hemorroids   FOOT SURGERY Right    Removed bone spur   GANGLION CYST EXCISION Left    foot   HEMORRHOID SURGERY     SKIN CANCER EXCISION     TOTAL KNEE ARTHROPLASTY Right 07/21/2020   UPPER GI ENDOSCOPY  03/23/2016   Mild gastritis, gastric polyps bxbenign squamous mucosa with no abnormaility, fundic glad poly in setting of mild chron gastritis.   Allergies  Allergen Reactions   Codeine Anaphylaxis and Nausea Only   Ambien [Zolpidem Tartrate] Other (See Comments)  Memory loss per patient   Clonazepam  Other (See Comments)    Other Reaction(s): Fatique, Mental Status Changes   Cyproheptadine  Hcl Other (See Comments)    Confusion   Dilaudid  [Hydromorphone ] Other (See Comments)    hallucinations   Hydrocodone Other (See Comments)    anaphylaxis   Lisinopril Cough   Pramipexole  Nausea Only   Sulfa Antibiotics Other (See Comments)    hives   Tramadol Nausea And Vomiting     Physical Exam: On inspection of the right hallux nail, there is some thickened residual fungal nail in the distal 20%.  The proximal 80% is clear.  Pain with pressure along the thicker distal portion of nail.  Palpable pedal pulses.  Minimal  erythema at distal pulp of toe, without calor or drainage.   Assessment/Plan of Care: 1. Pain in right toe(s)     Discussed findings with the patient today.  The distal 20% of the right hallux nail was trimmed and debrided to decrease length and thickness.  She noted immediate improvement once this was completed.  There not appear to be any other abnormality present.  Will see how she does with this over the next few days.  She will call our office if she is having any other issues.  Follow-up as scheduled with Dr. Lamount Awanda CHARM Loel, DPM, FACFAS [facility]     [address] [place], [place] [address]                Office [phone]  Fax [phone]

## 2024-06-17 DIAGNOSIS — G8918 Other acute postprocedural pain: Secondary | ICD-10-CM | POA: Diagnosis not present

## 2024-06-17 DIAGNOSIS — G2581 Restless legs syndrome: Secondary | ICD-10-CM | POA: Diagnosis not present

## 2024-06-17 DIAGNOSIS — J45909 Unspecified asthma, uncomplicated: Secondary | ICD-10-CM | POA: Diagnosis not present

## 2024-06-17 DIAGNOSIS — I10 Essential (primary) hypertension: Secondary | ICD-10-CM | POA: Diagnosis not present

## 2024-06-17 DIAGNOSIS — L4050 Arthropathic psoriasis, unspecified: Secondary | ICD-10-CM | POA: Diagnosis not present

## 2024-06-17 DIAGNOSIS — M1712 Unilateral primary osteoarthritis, left knee: Secondary | ICD-10-CM | POA: Diagnosis not present

## 2024-06-17 DIAGNOSIS — E782 Mixed hyperlipidemia: Secondary | ICD-10-CM | POA: Diagnosis not present

## 2024-06-17 DIAGNOSIS — E039 Hypothyroidism, unspecified: Secondary | ICD-10-CM | POA: Diagnosis not present

## 2024-06-17 DIAGNOSIS — K219 Gastro-esophageal reflux disease without esophagitis: Secondary | ICD-10-CM | POA: Diagnosis not present

## 2024-06-17 DIAGNOSIS — Z79899 Other long term (current) drug therapy: Secondary | ICD-10-CM | POA: Diagnosis not present

## 2024-06-17 DIAGNOSIS — Z471 Aftercare following joint replacement surgery: Secondary | ICD-10-CM | POA: Diagnosis not present

## 2024-06-17 DIAGNOSIS — Z880 Allergy status to penicillin: Secondary | ICD-10-CM | POA: Diagnosis not present

## 2024-06-17 DIAGNOSIS — Z96652 Presence of left artificial knee joint: Secondary | ICD-10-CM | POA: Diagnosis not present

## 2024-06-17 DIAGNOSIS — Z882 Allergy status to sulfonamides status: Secondary | ICD-10-CM | POA: Diagnosis not present

## 2024-06-17 DIAGNOSIS — G4733 Obstructive sleep apnea (adult) (pediatric): Secondary | ICD-10-CM | POA: Diagnosis not present

## 2024-06-17 DIAGNOSIS — Z7982 Long term (current) use of aspirin: Secondary | ICD-10-CM | POA: Diagnosis not present

## 2024-06-17 DIAGNOSIS — Z888 Allergy status to other drugs, medicaments and biological substances status: Secondary | ICD-10-CM | POA: Diagnosis not present

## 2024-06-17 DIAGNOSIS — Z885 Allergy status to narcotic agent status: Secondary | ICD-10-CM | POA: Diagnosis not present

## 2024-06-17 DIAGNOSIS — Z7951 Long term (current) use of inhaled steroids: Secondary | ICD-10-CM | POA: Diagnosis not present

## 2024-06-17 DIAGNOSIS — M1711 Unilateral primary osteoarthritis, right knee: Secondary | ICD-10-CM | POA: Diagnosis not present

## 2024-06-17 DIAGNOSIS — Z87891 Personal history of nicotine dependence: Secondary | ICD-10-CM | POA: Diagnosis not present

## 2024-06-17 HISTORY — PX: REPLACEMENT TOTAL KNEE: SUR1224

## 2024-06-20 ENCOUNTER — Ambulatory Visit (HOSPITAL_BASED_OUTPATIENT_CLINIC_OR_DEPARTMENT_OTHER)
Admission: EM | Admit: 2024-06-20 | Discharge: 2024-06-20 | Disposition: A | Discharge status: Home / Self Care | Attending: Family Medicine | Admitting: Family Medicine

## 2024-06-20 ENCOUNTER — Encounter (HOSPITAL_BASED_OUTPATIENT_CLINIC_OR_DEPARTMENT_OTHER): Payer: Self-pay

## 2024-06-20 DIAGNOSIS — M25562 Pain in left knee: Secondary | ICD-10-CM | POA: Diagnosis not present

## 2024-06-20 DIAGNOSIS — R350 Frequency of micturition: Secondary | ICD-10-CM | POA: Insufficient documentation

## 2024-06-20 DIAGNOSIS — R2689 Other abnormalities of gait and mobility: Secondary | ICD-10-CM | POA: Diagnosis not present

## 2024-06-20 DIAGNOSIS — Z96652 Presence of left artificial knee joint: Secondary | ICD-10-CM | POA: Diagnosis not present

## 2024-06-20 LAB — POCT URINE DIPSTICK
Bilirubin, UA: NEGATIVE
Glucose, UA: NEGATIVE mg/dL
Ketones, POC UA: NEGATIVE mg/dL
Leukocytes, UA: NEGATIVE
Nitrite, UA: NEGATIVE
POC PROTEIN,UA: NEGATIVE
Spec Grav, UA: >=1.03 — AB (ref 1.010–1.025)
Urobilinogen, UA: 0.2 U/dL
pH, UA: 6.5 (ref 5.0–8.0)

## 2024-06-20 NOTE — ED Triage Notes (Signed)
 Dysuria, pain/burning, urinary frequency with compromised output x 3 days. States doesn't feel as if her bladder is emptying completely.

## 2024-06-20 NOTE — Discharge Instructions (Addendum)
 Urine showed some trace blood but otherwise normal.  There is no infection at this time.  We will send for culture and call if any changes need to be made.  Like we spoke about this could be caused by your constipation.  Continue the MiraLAX  you can do it twice daily until good bowel movement.  Make sure you are complaining of fluids.  Follow-up as needed

## 2024-06-20 NOTE — ED Provider Notes (Signed)
 [facility]    CSN: [id number] Arrival date & time: 06/20/24  1638      History   Chief Complaint Chief Complaint  Patient presents with   Dysuria    HPI Olivia Cruz is a 73 y.o. female.   Patient is a 73 year old female presents today with dysuria, urinary frequency with compromised output x 3 days. States doesn't feel as if her bladder is emptying completely.  She has also had some constipation.  Currently taking MiraLAX .  No back pain, flank pain or fevers.    Dysuria   Past Medical History:  Diagnosis Date   Achilles tendonosis 10/06/2020   Acute cystitis with hematuria 09/29/2019   Acute infection of nasal sinus 06/21/2021   Allergic rhinoconjunctivitis    Anxiety    Asthma    Cardiac murmur 05/04/2022   Cataract    Cervical os stenosis 04/09/2015   Chest pain    Chest pain of uncertain etiology 05/04/2022   Chronic back pain 09/23/2021   Chronic knee pain after total replacement of right knee joint 07/21/2020   Chronic right shoulder pain 05/25/2021   Depression    Depression, major, recurrent, mild    Diverticulitis    Dysuria 07/12/2021   GERD (gastroesophageal reflux disease)    Heart murmur    Hypercholesterolemia    Hypertension    Hypothyroidism    Ingrown nail 02/04/2021   Insomnia    Laryngopharyngeal reflux (LPR)    Memory changes    Migraine without aura and without status migrainosus, not intractable 05/07/2021   Migraines    Mild recurrent major depression 07/12/2021   Mixed hyperlipidemia 12/22/2019   Muscle tension dysphonia 09/12/2018   Muscle wasting and atrophy, not elsewhere classified, left hand 12/01/2020   Nodule of finger of right hand 12/01/2020   Osteoarthritis    Osteopenia    Pain and swelling of right knee 12/25/2018   Postmenopausal atrophic vaginitis 04/09/2015   Prediabetes 12/22/2019   Psoriatic arthritis (HCC)    RBBB    RLS (restless legs syndrome)    Seasonal allergic rhinitis due to pollen  06/21/2021   Secondary hypothyroidism 12/22/2019   Skin cancer    Sleep apnea    Snoring 05/07/2021   Spondylolisthesis at L4-L5 level 04/27/2022   Status post total right knee replacement 12/29/2021   STD (sexually transmitted disease)    HSV II   Tendinopathy of right rotator cuff 05/25/2021   Tightness of heel cord, left 11/10/2020   Unilateral primary osteoarthritis, left knee 09/22/2022   Vocal fold atrophy 09/12/2018    Patient Active Problem List   Diagnosis Date Noted   Heart murmur    Unilateral primary osteoarthritis, left knee 09/22/2022   Chest pain of uncertain etiology 05/04/2022   Cardiac murmur 05/04/2022   Allergic rhinoconjunctivitis 04/27/2022   Anxiety 04/27/2022   Asthma 04/27/2022   Chest pain 04/27/2022   Depression 04/27/2022   Depression, major, recurrent, mild 04/27/2022   Diverticulitis 04/27/2022   GERD (gastroesophageal reflux disease) 04/27/2022   Glaucoma 04/27/2022   Hypercholesterolemia 04/27/2022   Hypothyroidism 04/27/2022   Insomnia 04/27/2022   Memory changes 04/27/2022   Migraines 04/27/2022   Osteoarthritis 04/27/2022   Osteopenia 04/27/2022   Skin cancer 04/27/2022   STD (sexually transmitted disease) 04/27/2022   Spondylolisthesis at L4-L5 level 04/27/2022   Status post total right knee replacement 12/29/2021   Mild recurrent major depression 07/12/2021   Dysuria 07/12/2021   Acute infection of nasal sinus 06/21/2021  Seasonal allergic rhinitis due to pollen 06/21/2021   Chronic right shoulder pain 05/25/2021   Tendinopathy of right rotator cuff 05/25/2021   Migraine without aura and without status migrainosus, not intractable 05/07/2021   Snoring 05/07/2021   Ingrown nail 02/04/2021   Muscle wasting and atrophy, not elsewhere classified, left hand 12/01/2020   Nodule of finger of right hand 12/01/2020   Tightness of heel cord, left 11/10/2020   Achilles tendonosis 10/06/2020   Chronic knee pain after total replacement of  right knee joint 07/21/2020   Secondary hypothyroidism 12/22/2019   Prediabetes 12/22/2019   Mixed hyperlipidemia 12/22/2019   RLS (restless legs syndrome) 12/22/2019   Psoriatic arthritis (HCC) 10/28/2019   Acute cystitis with hematuria 09/29/2019   Pain and swelling of right knee 12/25/2018   Laryngopharyngeal reflux (LPR) 09/12/2018   Vocal fold atrophy 09/12/2018   Muscle tension dysphonia 09/12/2018   Cervical os stenosis 04/09/2015   Postmenopausal atrophic vaginitis 04/09/2015    Past Surgical History:  Procedure Laterality Date   BACK SURGERY  2024   fusion of L4 and L5   BELPHAROPTOSIS REPAIR     BREAST SURGERY Left    times 2   CARDIAC CATHETERIZATION  10/05/2015   COLONOSCOPY  06/03/2013   Moderate predominantly sigmoid diverticulosis. Small interal hemorroids   FOOT SURGERY Right    Removed bone spur   GANGLION CYST EXCISION Left    foot   HEMORRHOID SURGERY     SKIN CANCER EXCISION     TOTAL KNEE ARTHROPLASTY Right 07/21/2020   UPPER GI ENDOSCOPY  03/23/2016   Mild gastritis, gastric polyps bxbenign squamous mucosa with no abnormaility, fundic glad poly in setting of mild chron gastritis.    OB History     Gravida  4   Para  2   Term      Preterm      AB  2   Living  2      SAB  2   IAB      Ectopic      Multiple      Live Births  2            Home Medications    Prior to Admission medications   Medication Sig Start Date End Date Taking? Authorizing Provider  Adalimumab (HUMIRA PEN) 40 MG/0.4ML PNKT Inject 40 mg into the skin once a week. Unsure if dose is 40 mg- every other week    [provider]  ascorbic acid (VITAMIN C) 500 MG tablet Take 500 mg by mouth daily.    [provider]  beclomethasone (QVAR  REDIHALER) 40 MCG/ACT inhaler INHALE 2 INHALATIONS BY MOUTH 1  TO 2 TIMES DAILY TO PREVENT  COUGH OR WHEEZE. RINSE, GARGLE,  AND SPIT AFTER USE 08/07/23   Kozlow, Camellia PARAS, MD  Biotin w/ Vitamins C & E  (HAIR/SKIN/NAILS PO) Take 1 tablet by mouth daily.    [provider]  budesonide  (RHINOCORT  AQUA) 32 MCG/ACT nasal spray Place 1 spray into both nostrils daily. Reported on 02/04/2016 06/21/21   Sherre Clapper, MD  CALCIUM  MAGNESIUM  ZINC PO Take by mouth.    [provider]  cetirizine  (ZYRTEC ) 10 MG tablet Take 1 tablet (10 mg total) by mouth daily as needed for allergies. 05/25/23   Kozlow, Camellia PARAS, MD  ciclopirox  (PENLAC ) 8 % solution Apply topically at bedtime. Apply over nail and surrounding skin. Apply daily over previous coat. After seven (7) days, may remove with alcohol  and continue  cycle. 05/06/24   Lamount Ethan CROME, DPM  clindamycin (CLEOCIN) 300 MG capsule Take 300 mg by mouth as needed. AS NEEDED FOR DENTAL PROCEDURE    [provider]  Cyanocobalamin (VITAMIN B 12 PO) Take by mouth daily.    [provider]  diclofenac  Sodium (VOLTAREN ) 1 % GEL Apply 1 Application topically as needed for pain (Knee pain). 10/06/20   [provider]  DULoxetine  (CYMBALTA ) 30 MG capsule 1 capsule 2 (two) times daily. 07/31/23   [provider]  famotidine  (PEPCID ) 40 MG tablet Take 1 tablet (40 mg total) by mouth at bedtime. 05/16/24   Kozlow, Camellia PARAS, MD  levothyroxine  (SYNTHROID ) 50 MCG tablet TAKE 1 TABLET BY MOUTH DAILY 10/18/21   Cox, Abigail, MD  methocarbamol  (ROBAXIN ) 500 MG tablet Take 1 tablet (500 mg total) by mouth every 6 (six) hours as needed for muscle spasms. 01/31/23   Colon Shove, MD  Multiple Vitamin (MULTIVITAMIN WITH MINERALS) TABS tablet Take 1 tablet by mouth daily. Woman 50+    [provider]  nitrofurantoin  (MACRODANTIN ) 100 MG capsule Take 100 mg by mouth as needed (after  intercourse). 09/12/18   [provider]  nitroGLYCERIN  (NITROSTAT ) 0.4 MG SL tablet Place 1 tablet (0.4 mg total) under the tongue every 5 (five) minutes as needed for chest pain. 05/04/22   Revankar, Jennifer SAUNDERS, MD  pantoprazole  (PROTONIX ) 40 MG tablet  Take 1 tablet 1-2 times per day 05/25/23   Kozlow, Eric J, MD  Propylene Glycol, PF, (SYSTANE COMPLETE PF) 0.6 % SOLN Place 1 drop into both eyes daily as needed (dry eyes).    [provider]  valACYclovir  (VALTREX ) 500 MG tablet Take 1 tablet (500 mg total) by mouth 3 (three) times daily as needed (herpes outbreak). 07/12/21   CoxAbigail, MD    Family History Family History  Problem Relation Age of Onset   Thyroid  disease Mother    Heart failure Mother    Heart disease Mother    Hyperlipidemia Mother    Hypertension Mother    Stroke Mother    Depression Mother    Parkinson's disease Father    Glaucoma Father    Diverticulitis Father    Breast cancer Sister        lung   Cancer Maternal Grandmother    Multiple births Maternal Grandmother    Multiple births Paternal Grandmother    Cancer Paternal Grandmother    Colon cancer Neg Hx    Esophageal cancer Neg Hx    Pancreatic cancer Neg Hx    Stomach cancer Neg Hx    Rectal cancer Neg Hx     Social History Social History   Tobacco Use   Smoking status: Former    Current packs/day: 0.00    Types: Cigarettes    Quit date: 1986    Years since quitting: 39.8   Smokeless tobacco: Never  Vaping Use   Vaping status: Never Used  Substance Use Topics   Alcohol  use: No    Alcohol /week: 0.0 standard drinks of alcohol    Drug use: No     Allergies   Codeine, Ambien [zolpidem tartrate], Clonazepam , Cyproheptadine  hcl, Dilaudid  [hydromorphone ], Hydrocodone, Lisinopril, Pramipexole , Sulfa antibiotics, and Tramadol   Review of Systems Review of Systems  Genitourinary:  Positive for dysuria.     Physical Exam Triage Vital Signs ED Triage Vitals  Encounter Vitals Group     BP 06/20/24 1712 (!) 143/80     Girls Systolic BP Percentile --  Girls Diastolic BP Percentile --      Boys Systolic BP Percentile --      Boys Diastolic BP Percentile --      Pulse Rate 06/20/24 1712 90     Resp 06/20/24 1712 20      Temp 06/20/24 1712 98.3 F (36.8 C)     Temp Source 06/20/24 1712 Oral     SpO2 06/20/24 1712 92 %     Weight --      Height --      Head Circumference --      Peak Flow --      Pain Score 06/20/24 1714 3     Pain Loc --      Pain Education --      Exclude from Growth Chart --    No data found.  Updated Vital Signs BP (!) 143/80 (BP Location: Right Arm)   Pulse 90   Temp 98.3 F (36.8 C) (Oral)   Resp 20   LMP 08/22/2005   SpO2 92%   Visual Acuity Right Eye Distance:   Left Eye Distance:   Bilateral Distance:    Right Eye Near:   Left Eye Near:    Bilateral Near:     Physical Exam Vitals and nursing note reviewed.  Constitutional:      General: She is not in acute distress.    Appearance: Normal appearance. She is not ill-appearing, toxic-appearing or diaphoretic.  Pulmonary:     Effort: Pulmonary effort is normal.  Neurological:     Mental Status: She is alert.  Psychiatric:        Mood and Affect: Mood normal.      UC Treatments / Results  Labs (all labs ordered are listed, but only abnormal results are displayed) Labs Reviewed  POCT URINE DIPSTICK - Abnormal; Notable for the following components:      Result Value   Spec Grav, UA >=1.030 (*)    Blood, UA trace-intact (*)    All other components within normal limits    EKG   Radiology No results found.  Procedures Procedures (including critical care time)  Medications Ordered in UC Medications - No data to display  Initial Impression / Assessment and Plan / UC Course  I have reviewed the triage vital signs and the nursing notes.  Pertinent labs & imaging results that were available during my care of the patient were reviewed by me and considered in my medical decision making (see chart for details).     Urinary frequency- Urine showed some trace blood but otherwise normal.  There is no infection at this time.  We will send for culture and call if any changes need to be made.  Like we  spoke about this could be caused by your constipation.  Continue the MiraLAX  you can do it twice daily until good bowel movement.  Make sure you are complaining of fluids.  Follow-up as needed Final Clinical Impressions(s) / UC Diagnoses   Final diagnoses:  Urinary frequency     Discharge Instructions      Urine showed some trace blood but otherwise normal.  There is no infection at this time.  We will send for culture and call if any changes need to be made.  Like we spoke about this could be caused by your constipation.  Continue the MiraLAX  you can do it twice daily until good bowel movement.  Make sure you are complaining of fluids.  Follow-up as needed  ED Prescriptions   None    PDMP not reviewed this encounter.   Adah Wilbert LABOR, FNP 06/24/24 [phone]

## 2024-06-21 ENCOUNTER — Other Ambulatory Visit: Payer: Self-pay | Admitting: Allergy and Immunology

## 2024-06-21 DIAGNOSIS — K219 Gastro-esophageal reflux disease without esophagitis: Secondary | ICD-10-CM

## 2024-06-21 LAB — URINE CULTURE: Culture: NO GROWTH

## 2024-06-24 ENCOUNTER — Ambulatory Visit (HOSPITAL_BASED_OUTPATIENT_CLINIC_OR_DEPARTMENT_OTHER): Payer: Self-pay | Admitting: Family Medicine

## 2024-06-24 ENCOUNTER — Encounter (HOSPITAL_BASED_OUTPATIENT_CLINIC_OR_DEPARTMENT_OTHER): Payer: Self-pay

## 2024-06-24 ENCOUNTER — Ambulatory Visit (HOSPITAL_COMMUNITY): Payer: Self-pay

## 2024-06-24 ENCOUNTER — Ambulatory Visit (HOSPITAL_BASED_OUTPATIENT_CLINIC_OR_DEPARTMENT_OTHER)
Admission: EM | Admit: 2024-06-24 | Discharge: 2024-06-24 | Disposition: A | Discharge status: Home / Self Care | Attending: Family Medicine | Admitting: Family Medicine

## 2024-06-24 ENCOUNTER — Ambulatory Visit (INDEPENDENT_AMBULATORY_CARE_PROVIDER_SITE_OTHER): Admit: 2024-06-24 | Discharge: 2024-06-24 | Disposition: A | Discharge status: Home / Self Care | Admitting: Radiology

## 2024-06-24 DIAGNOSIS — R1084 Generalized abdominal pain: Secondary | ICD-10-CM | POA: Diagnosis not present

## 2024-06-24 DIAGNOSIS — I878 Other specified disorders of veins: Secondary | ICD-10-CM | POA: Diagnosis not present

## 2024-06-24 NOTE — ED Triage Notes (Signed)
 Pt c/o constipation since her surgery on 06/17/24. She has had one BM since her surgery. She has not taken her pain pills this morning. She has tried taking miralax , fleet enema, mineral oil, prune juice, and increase water intake with no relief. She did not realize that she was recommended to take miralax  twice a day for the constipation and has not been taking it that much. She has been having increased R&LLQ pain and rectum pain.

## 2024-06-24 NOTE — Discharge Instructions (Signed)
 Purchase some magnesium  citrate over-the-counter for constipation Make sure you are drinking plenty of fluids.   Go to the ER if your symptoms worsen

## 2024-06-24 NOTE — ED Provider Notes (Signed)
 [facility]    CSN: [id number] Arrival date & time: 06/24/24  1101      History   Chief Complaint Chief Complaint  Patient presents with   Constipation    HPI Olivia Cruz is a 73 y.o. female.   Pt c/o constipation since her surgery on 06/17/24. She has had one BM since her surgery. She has not taken her pain pills this morning.  Currently taking oxycodone , 2 pills every 4 hours.  She has tried taking miralax , fleet enema, mineral oil, prune juice, and increase water intake with no relief. She did not realize that she was recommended to take miralax  twice a day for the constipation and has not been taking it that much. She has been having increased R&LLQ pain and rectum pain.     Constipation   Past Medical History:  Diagnosis Date   Achilles tendonosis 10/06/2020   Acute cystitis with hematuria 09/29/2019   Acute infection of nasal sinus 06/21/2021   Allergic rhinoconjunctivitis    Anxiety    Asthma    Cardiac murmur 05/04/2022   Cataract    Cervical os stenosis 04/09/2015   Chest pain    Chest pain of uncertain etiology 05/04/2022   Chronic back pain 09/23/2021   Chronic knee pain after total replacement of right knee joint 07/21/2020   Chronic right shoulder pain 05/25/2021   Depression    Depression, major, recurrent, mild    Diverticulitis    Dysuria 07/12/2021   GERD (gastroesophageal reflux disease)    Heart murmur    Hypercholesterolemia    Hypertension    Hypothyroidism    Ingrown nail 02/04/2021   Insomnia    Laryngopharyngeal reflux (LPR)    Memory changes    Migraine without aura and without status migrainosus, not intractable 05/07/2021   Migraines    Mild recurrent major depression 07/12/2021   Mixed hyperlipidemia 12/22/2019   Muscle tension dysphonia 09/12/2018   Muscle wasting and atrophy, not elsewhere classified, left hand 12/01/2020   Nodule of finger of right hand 12/01/2020   Osteoarthritis    Osteopenia    Pain and  swelling of right knee 12/25/2018   Postmenopausal atrophic vaginitis 04/09/2015   Prediabetes 12/22/2019   Psoriatic arthritis (HCC)    RBBB    RLS (restless legs syndrome)    Seasonal allergic rhinitis due to pollen 06/21/2021   Secondary hypothyroidism 12/22/2019   Skin cancer    Sleep apnea    Snoring 05/07/2021   Spondylolisthesis at L4-L5 level 04/27/2022   Status post total right knee replacement 12/29/2021   STD (sexually transmitted disease)    HSV II   Tendinopathy of right rotator cuff 05/25/2021   Tightness of heel cord, left 11/10/2020   Unilateral primary osteoarthritis, left knee 09/22/2022   Vocal fold atrophy 09/12/2018    Patient Active Problem List   Diagnosis Date Noted   Heart murmur    Unilateral primary osteoarthritis, left knee 09/22/2022   Chest pain of uncertain etiology 05/04/2022   Cardiac murmur 05/04/2022   Allergic rhinoconjunctivitis 04/27/2022   Anxiety 04/27/2022   Asthma 04/27/2022   Chest pain 04/27/2022   Depression 04/27/2022   Depression, major, recurrent, mild 04/27/2022   Diverticulitis 04/27/2022   GERD (gastroesophageal reflux disease) 04/27/2022   Glaucoma 04/27/2022   Hypercholesterolemia 04/27/2022   Hypothyroidism 04/27/2022   Insomnia 04/27/2022   Memory changes 04/27/2022   Migraines 04/27/2022   Osteoarthritis 04/27/2022   Osteopenia 04/27/2022   Skin cancer 04/27/2022  STD (sexually transmitted disease) 04/27/2022   Spondylolisthesis at L4-L5 level 04/27/2022   Status post total right knee replacement 12/29/2021   Mild recurrent major depression 07/12/2021   Dysuria 07/12/2021   Acute infection of nasal sinus 06/21/2021   Seasonal allergic rhinitis due to pollen 06/21/2021   Chronic right shoulder pain 05/25/2021   Tendinopathy of right rotator cuff 05/25/2021   Migraine without aura and without status migrainosus, not intractable 05/07/2021   Snoring 05/07/2021   Ingrown nail 02/04/2021   Muscle wasting and  atrophy, not elsewhere classified, left hand 12/01/2020   Nodule of finger of right hand 12/01/2020   Tightness of heel cord, left 11/10/2020   Achilles tendonosis 10/06/2020   Chronic knee pain after total replacement of right knee joint 07/21/2020   Secondary hypothyroidism 12/22/2019   Prediabetes 12/22/2019   Mixed hyperlipidemia 12/22/2019   RLS (restless legs syndrome) 12/22/2019   Psoriatic arthritis (HCC) 10/28/2019   Acute cystitis with hematuria 09/29/2019   Pain and swelling of right knee 12/25/2018   Laryngopharyngeal reflux (LPR) 09/12/2018   Vocal fold atrophy 09/12/2018   Muscle tension dysphonia 09/12/2018   Cervical os stenosis 04/09/2015   Postmenopausal atrophic vaginitis 04/09/2015    Past Surgical History:  Procedure Laterality Date   BACK SURGERY  2024   fusion of L4 and L5   BELPHAROPTOSIS REPAIR     BREAST SURGERY Left    times 2   CARDIAC CATHETERIZATION  10/05/2015   COLONOSCOPY  06/03/2013   Moderate predominantly sigmoid diverticulosis. Small interal hemorroids   FOOT SURGERY Right    Removed bone spur   GANGLION CYST EXCISION Left    foot   HEMORRHOID SURGERY     REPLACEMENT TOTAL KNEE Left 06/17/2024   SKIN CANCER EXCISION     TOTAL KNEE ARTHROPLASTY Right 07/21/2020   UPPER GI ENDOSCOPY  03/23/2016   Mild gastritis, gastric polyps bxbenign squamous mucosa with no abnormaility, fundic glad poly in setting of mild chron gastritis.    OB History     Gravida  4   Para  2   Term      Preterm      AB  2   Living  2      SAB  2   IAB      Ectopic      Multiple      Live Births  2            Home Medications    Prior to Admission medications   Medication Sig Start Date End Date Taking? Authorizing Provider  Adalimumab (HUMIRA PEN) 40 MG/0.4ML PNKT Inject 40 mg into the skin once a week. Unsure if dose is 40 mg- every other week    [provider]  ascorbic acid (VITAMIN C) 500 MG tablet Take 500 mg by mouth  daily.    [provider]  beclomethasone (QVAR  REDIHALER) 40 MCG/ACT inhaler INHALE 2 INHALATIONS BY MOUTH 1  TO 2 TIMES DAILY TO PREVENT  COUGH OR WHEEZE. RINSE, GARGLE,  AND SPIT AFTER USE 08/07/23   Kozlow, Camellia PARAS, MD  Biotin w/ Vitamins C & E (HAIR/SKIN/NAILS PO) Take 1 tablet by mouth daily.    [provider]  budesonide  (RHINOCORT  AQUA) 32 MCG/ACT nasal spray Place 1 spray into both nostrils daily. Reported on 02/04/2016 06/21/21   Sherre Clapper, MD  CALCIUM  MAGNESIUM  ZINC PO Take by mouth.    [provider]  cetirizine  (ZYRTEC ) 10 MG tablet Take 1  tablet (10 mg total) by mouth daily as needed for allergies. 05/25/23   Kozlow, Camellia PARAS, MD  ciclopirox  (PENLAC ) 8 % solution Apply topically at bedtime. Apply over nail and surrounding skin. Apply daily over previous coat. After seven (7) days, may remove with alcohol  and continue cycle. 05/06/24   Lamount Ethan CROME, DPM  clindamycin (CLEOCIN) 300 MG capsule Take 300 mg by mouth as needed. AS NEEDED FOR DENTAL PROCEDURE    [provider]  Cyanocobalamin (VITAMIN B 12 PO) Take by mouth daily.    [provider]  diclofenac  Sodium (VOLTAREN ) 1 % GEL Apply 1 Application topically as needed for pain (Knee pain). 10/06/20   [provider]  DULoxetine  (CYMBALTA ) 30 MG capsule 1 capsule 2 (two) times daily. 07/31/23   [provider]  famotidine  (PEPCID ) 40 MG tablet Take 1 tablet (40 mg total) by mouth at bedtime. 05/16/24   Kozlow, Camellia PARAS, MD  levothyroxine  (SYNTHROID ) 50 MCG tablet TAKE 1 TABLET BY MOUTH DAILY 10/18/21   CoxAbigail, MD  methocarbamol  (ROBAXIN ) 500 MG tablet Take 1 tablet (500 mg total) by mouth every 6 (six) hours as needed for muscle spasms. 01/31/23   Colon Shove, MD  Multiple Vitamin (MULTIVITAMIN WITH MINERALS) TABS tablet Take 1 tablet by mouth daily. Woman 50+    [provider]  nitrofurantoin  (MACRODANTIN ) 100 MG capsule Take 100 mg by mouth as needed (after   intercourse). 09/12/18   [provider]  nitroGLYCERIN  (NITROSTAT ) 0.4 MG SL tablet Place 1 tablet (0.4 mg total) under the tongue every 5 (five) minutes as needed for chest pain. 05/04/22   Revankar, Jennifer SAUNDERS, MD  pantoprazole  (PROTONIX ) 40 MG tablet Take 1 tablet 1-2 times per day 05/25/23   Kozlow, Eric J, MD  Propylene Glycol, PF, (SYSTANE COMPLETE PF) 0.6 % SOLN Place 1 drop into both eyes daily as needed (dry eyes).    [provider]  valACYclovir  (VALTREX ) 500 MG tablet Take 1 tablet (500 mg total) by mouth 3 (three) times daily as needed (herpes outbreak). 07/12/21   CoxAbigail, MD    Family History Family History  Problem Relation Age of Onset   Thyroid  disease Mother    Heart failure Mother    Heart disease Mother    Hyperlipidemia Mother    Hypertension Mother    Stroke Mother    Depression Mother    Parkinson's disease Father    Glaucoma Father    Diverticulitis Father    Breast cancer Sister        lung   Cancer Maternal Grandmother    Multiple births Maternal Grandmother    Multiple births Paternal Grandmother    Cancer Paternal Grandmother    Colon cancer Neg Hx    Esophageal cancer Neg Hx    Pancreatic cancer Neg Hx    Stomach cancer Neg Hx    Rectal cancer Neg Hx     Social History Social History   Tobacco Use   Smoking status: Former    Current packs/day: 0.00    Types: Cigarettes    Quit date: 1986    Years since quitting: 39.8   Smokeless tobacco: Never  Vaping Use   Vaping status: Never Used  Substance Use Topics   Alcohol  use: No    Alcohol /week: 0.0 standard drinks of alcohol    Drug use: No     Allergies   Codeine, Ambien [zolpidem tartrate], Clonazepam , Cyproheptadine  hcl, Dilaudid  [hydromorphone ], Hydrocodone, Lisinopril, Pramipexole , Sulfa antibiotics, and  Tramadol   Review of Systems Review of Systems  Gastrointestinal:  Positive for constipation.     Physical Exam Triage Vital Signs ED Triage Vitals   Encounter Vitals Group     BP 06/24/24 1120 133/89     Girls Systolic BP Percentile --      Girls Diastolic BP Percentile --      Boys Systolic BP Percentile --      Boys Diastolic BP Percentile --      Pulse Rate 06/24/24 1120 (!) 108     Resp 06/24/24 1120 20     Temp 06/24/24 1120 98.4 F (36.9 C)     Temp Source 06/24/24 1120 Oral     SpO2 06/24/24 1120 96 %     Weight --      Height --      Head Circumference --      Peak Flow --      Pain Score 06/24/24 1117 9     Pain Loc --      Pain Education --      Exclude from Growth Chart --    No data found.  Updated Vital Signs BP 133/89 (BP Location: Right Arm)   Pulse (!) 108   Temp 98.4 F (36.9 C) (Oral)   Resp 20   LMP 08/22/2005   SpO2 96%   Visual Acuity Right Eye Distance:   Left Eye Distance:   Bilateral Distance:    Right Eye Near:   Left Eye Near:    Bilateral Near:     Physical Exam Vitals and nursing note reviewed.  Constitutional:      General: She is not in acute distress.    Appearance: Normal appearance. She is not ill-appearing, toxic-appearing or diaphoretic.  Pulmonary:     Effort: Pulmonary effort is normal.  Neurological:     Mental Status: She is alert.  Psychiatric:        Mood and Affect: Mood normal.      UC Treatments / Results  Labs (all labs ordered are listed, but only abnormal results are displayed) Labs Reviewed - No data to display  EKG   Radiology No results found.  Procedures Procedures (including critical care time)  Medications Ordered in UC Medications - No data to display  Initial Impression / Assessment and Plan / UC Course  I have reviewed the triage vital signs and the nursing notes.  Pertinent labs & imaging results that were available during my care of the patient were reviewed by me and considered in my medical decision making (see chart for details).     Generalized abdominal pain with constipation.  X-ray revealed moderate stool burden.  She  has tried multiple things without any relief.  Recommended to try magnesium  citrate over-the-counter.  Hopefully this will help.  If not she will need to go to the ER.  Also recommended that she start to wean down some on the oxycodone  since this is most likely the cause of her constipation.  She is taking high doses of this. Final Clinical Impressions(s) / UC Diagnoses   Final diagnoses:  Generalized abdominal pain     Discharge Instructions      Purchase some magnesium  citrate over-the-counter for constipation Make sure you are drinking plenty of fluids.   Go to the ER if your symptoms worsen    ED Prescriptions   None    PDMP not reviewed this encounter.   Adah Wilbert LABOR, FNP 06/24/24 1246

## 2024-06-27 DIAGNOSIS — Z96652 Presence of left artificial knee joint: Secondary | ICD-10-CM | POA: Diagnosis not present

## 2024-06-27 DIAGNOSIS — R2689 Other abnormalities of gait and mobility: Secondary | ICD-10-CM | POA: Diagnosis not present

## 2024-06-27 DIAGNOSIS — M25562 Pain in left knee: Secondary | ICD-10-CM | POA: Diagnosis not present

## 2024-07-01 DIAGNOSIS — Z96652 Presence of left artificial knee joint: Secondary | ICD-10-CM | POA: Diagnosis not present

## 2024-07-01 DIAGNOSIS — R2689 Other abnormalities of gait and mobility: Secondary | ICD-10-CM | POA: Diagnosis not present

## 2024-07-01 DIAGNOSIS — M25562 Pain in left knee: Secondary | ICD-10-CM | POA: Diagnosis not present

## 2024-07-02 DIAGNOSIS — Z96652 Presence of left artificial knee joint: Secondary | ICD-10-CM | POA: Diagnosis not present

## 2024-07-04 DIAGNOSIS — Z96652 Presence of left artificial knee joint: Secondary | ICD-10-CM | POA: Diagnosis not present

## 2024-07-04 DIAGNOSIS — M25562 Pain in left knee: Secondary | ICD-10-CM | POA: Diagnosis not present

## 2024-07-04 DIAGNOSIS — R2689 Other abnormalities of gait and mobility: Secondary | ICD-10-CM | POA: Diagnosis not present

## 2024-07-08 DIAGNOSIS — M25562 Pain in left knee: Secondary | ICD-10-CM | POA: Diagnosis not present

## 2024-07-08 DIAGNOSIS — Z96652 Presence of left artificial knee joint: Secondary | ICD-10-CM | POA: Diagnosis not present

## 2024-07-08 DIAGNOSIS — R2689 Other abnormalities of gait and mobility: Secondary | ICD-10-CM | POA: Diagnosis not present

## 2024-07-11 DIAGNOSIS — M25562 Pain in left knee: Secondary | ICD-10-CM | POA: Diagnosis not present

## 2024-07-11 DIAGNOSIS — R2689 Other abnormalities of gait and mobility: Secondary | ICD-10-CM | POA: Diagnosis not present

## 2024-07-11 DIAGNOSIS — Z96652 Presence of left artificial knee joint: Secondary | ICD-10-CM | POA: Diagnosis not present

## 2024-07-16 DIAGNOSIS — R2689 Other abnormalities of gait and mobility: Secondary | ICD-10-CM | POA: Diagnosis not present

## 2024-07-16 DIAGNOSIS — M25562 Pain in left knee: Secondary | ICD-10-CM | POA: Diagnosis not present

## 2024-07-16 DIAGNOSIS — Z96652 Presence of left artificial knee joint: Secondary | ICD-10-CM | POA: Diagnosis not present

## 2024-07-19 DIAGNOSIS — M25562 Pain in left knee: Secondary | ICD-10-CM | POA: Diagnosis not present

## 2024-07-19 DIAGNOSIS — R2689 Other abnormalities of gait and mobility: Secondary | ICD-10-CM | POA: Diagnosis not present

## 2024-07-19 DIAGNOSIS — Z96652 Presence of left artificial knee joint: Secondary | ICD-10-CM | POA: Diagnosis not present

## 2024-07-22 DIAGNOSIS — R2689 Other abnormalities of gait and mobility: Secondary | ICD-10-CM | POA: Diagnosis not present

## 2024-07-22 DIAGNOSIS — Z96652 Presence of left artificial knee joint: Secondary | ICD-10-CM | POA: Diagnosis not present

## 2024-07-22 DIAGNOSIS — M25562 Pain in left knee: Secondary | ICD-10-CM | POA: Diagnosis not present

## 2024-07-24 ENCOUNTER — Ambulatory Visit: Admitting: Cardiology

## 2024-07-24 DIAGNOSIS — Z96652 Presence of left artificial knee joint: Secondary | ICD-10-CM | POA: Diagnosis not present

## 2024-07-29 DIAGNOSIS — R053 Chronic cough: Secondary | ICD-10-CM | POA: Diagnosis not present

## 2024-07-29 DIAGNOSIS — Z79899 Other long term (current) drug therapy: Secondary | ICD-10-CM | POA: Diagnosis not present

## 2024-07-29 DIAGNOSIS — L4059 Other psoriatic arthropathy: Secondary | ICD-10-CM | POA: Diagnosis not present

## 2024-08-05 ENCOUNTER — Encounter: Payer: Self-pay | Admitting: Podiatry

## 2024-08-05 ENCOUNTER — Ambulatory Visit: Admitting: Podiatry

## 2024-08-05 DIAGNOSIS — B351 Tinea unguium: Secondary | ICD-10-CM | POA: Diagnosis not present

## 2024-08-05 DIAGNOSIS — M7662 Achilles tendinitis, left leg: Secondary | ICD-10-CM | POA: Diagnosis not present

## 2024-08-05 DIAGNOSIS — M79674 Pain in right toe(s): Secondary | ICD-10-CM | POA: Diagnosis not present

## 2024-08-05 MED ORDER — CICLOPIROX 8 % EX SOLN: Freq: Every day | CUTANEOUS | 12 refills | Status: AC

## 2024-08-05 MED ORDER — METHYLPREDNISOLONE 4 MG PO TBPK: ORAL_TABLET | ORAL | 0 refills | Status: DC

## 2024-08-05 NOTE — Patient Instructions (Signed)

## 2024-08-05 NOTE — Progress Notes (Unsigned)
°  Subjective:  Patient ID: Olivia Cruz, female    DOB: 1950-11-20,  MRN: [id number]  Chief Complaint  Patient presents with   Nail Problem    Checking right hallux nail, looking better.  Left Achilles pain, most probably inflamed from PT for left knee replacement.     Discussed the use of AI scribe software for clinical note transcription with the patient, who gave verbal consent to proceed.  History of Present Illness Olivia Cruz is a 73 year old female with psoriatic arthritis who presents with left Achilles tendon pain and toenail issues.  She reports brittle, thickened distal toenails involving about fifteen percent of right first toenail.  She has previously completed course of oral terbinafine .  Did not start topical Penlac , states that she did not receive the prescription.  She has aching pain at the insertion of the left Achilles tendon near the heel bone, present since a strain in 2008, with recurrence in 2022 and persistent symptoms since her left knee replacement seven weeks ago. Certain shoes, such as [employer] slip-ins, worsen the pain.  She had left knee replacement surgery seven weeks ago and attends physical therapy twice weekly with good knee range of motion. She delayed resuming Humira after surgery because of infection and healing concerns.  She currently takes Tylenol  and meloxicam  twice daily for pain and has taken prednisone in the past without problems.      Objective:    Physical Exam MUSCULOSKELETAL: Tenderness on palpation of the left Achilles tendon. Thickening and inflammation of the left Achilles tendon. No gaps in the left Achilles tendon. Muscle strength 5/5 in dorsiflexion, plantarflexion inversion and eversion bilaterally. DP and PT pulses palpable 2/4 bilaterally.  Cap refill intact to the digits Residual nail plate thickening right first toenail with brittle appearance and some discoloration affecting the distal 15% of the nail   No  images are attached to the encounter.    Results RADIOLOGY Knee X-ray: Normal (07/29/2024)   Assessment:   1. Tendonitis, Achilles, left   2. Pain due to onychomycosis of toenail of right foot      Plan:  Patient was evaluated and treated and all questions answered.  Assessment and Plan Assessment & Plan Onychomycosis of right toenail Onychomycosis with 15% of the nail affected, remainder healthy. - Prescribed Penlac  for topical treatment.  Reviewed proper application instructions.  Left Achilles tendinitis Chronic tendinitis with recent exacerbation post-knee surgery. Pain localized to tendon and heel, with thickening and inflammation. No current tears. Pain influenced by footwear and exercises. Meloxicam  used for pain.  Currently taking Humira.  Decided against boot due to gait concerns while recovering from left total knee replacement - Use felt heel lifts to reduce tendon pressure. - Provided compression sleeve with padding. - Continue meloxicam . - Initiated Medrol  Dose Pack taper. - Advised rest and immobilization. - Written instructions for home stretching regimen dispensed.  Complete 2-3 times a day over several weeks. - Follow-up in 3-4 weeks.      No follow-ups on file.

## 2024-08-27 DIAGNOSIS — G4730 Sleep apnea, unspecified: Secondary | ICD-10-CM | POA: Insufficient documentation

## 2024-08-27 DIAGNOSIS — H269 Unspecified cataract: Secondary | ICD-10-CM | POA: Insufficient documentation

## 2024-08-27 DIAGNOSIS — I10 Essential (primary) hypertension: Secondary | ICD-10-CM | POA: Insufficient documentation

## 2024-08-27 DIAGNOSIS — I4510 Unspecified right bundle-branch block: Secondary | ICD-10-CM | POA: Insufficient documentation

## 2024-08-29 ENCOUNTER — Encounter: Payer: Self-pay | Admitting: Cardiology

## 2024-08-29 ENCOUNTER — Ambulatory Visit: Payer: PRIVATE HEALTH INSURANCE | Attending: Cardiology | Admitting: Cardiology

## 2024-08-29 ENCOUNTER — Encounter: Payer: Self-pay | Admitting: Gastroenterology

## 2024-08-29 VITALS — BP 130/70 | HR 80 | Ht 61.0 in | Wt 155.2 lb

## 2024-08-29 DIAGNOSIS — E66811 Obesity, class 1: Secondary | ICD-10-CM | POA: Diagnosis not present

## 2024-08-29 DIAGNOSIS — M6289 Other specified disorders of muscle: Secondary | ICD-10-CM

## 2024-08-29 DIAGNOSIS — I10 Essential (primary) hypertension: Secondary | ICD-10-CM

## 2024-08-29 DIAGNOSIS — R32 Unspecified urinary incontinence: Secondary | ICD-10-CM

## 2024-08-29 DIAGNOSIS — R159 Full incontinence of feces: Secondary | ICD-10-CM

## 2024-08-29 DIAGNOSIS — E782 Mixed hyperlipidemia: Secondary | ICD-10-CM | POA: Diagnosis not present

## 2024-08-29 MED ORDER — ROSUVASTATIN CALCIUM 10 MG PO TABS: 10.0000 mg | ORAL_TABLET | Freq: Every day | ORAL | 3 refills | Status: AC

## 2024-08-29 MED ORDER — FISH OIL 1000 MG PO CAPS: 2.0000 | ORAL_CAPSULE | Freq: Two times a day (BID) | ORAL | Status: AC

## 2024-08-29 NOTE — Progress Notes (Signed)
 " Cardiology Office Note:    Date:  08/29/2024   ID:  Olivia Cruz, DOB 1951-05-16, MRN [id number]  PCP:  Sun, Vyvyan, MD  Cardiologist:  Jennifer JONELLE Crape, MD   Referring MD: Sun, Vyvyan, MD    ASSESSMENT:    1. Primary hypertension   2. Mixed hyperlipidemia   3. Obesity (BMI 30.0-34.9)    PLAN:    In order of problems listed above:  Primary prevention stressed with the patient.  Importance of compliance with diet medication stressed and patient verbalized standing. She was advised to ambulate to the best of her ability.  Hyperlipidemia: Diet was emphasized.  Because of her very markedly elevated lipids I suggested rosuvastatin .  She is agreeable.  Benefits risks explained.  With statin milligrams daily and she will back in 6 weeks for liver lipid check.  Diet emphasized.  She was also encouraged to take fish oil  2 g twice daily. Obesity: Weight reduction stressed diet emphasized.  Risks of obesity explained and she promises to do better. Patient will be seen in follow-up appointment in 12 months or earlier if the patient has any concerns.   Medication Adjustments/Labs and Tests Ordered: Current medicines are reviewed at length with the patient today.  Concerns regarding medicines are outlined above.  No orders of the defined types were placed in this encounter.  No orders of the defined types were placed in this encounter.    No chief complaint on file.    History of Present Illness:    Olivia Cruz is a 74 y.o. female.  Patient has past medical history of familial hyperlipidemia and obesity.  She underwent knee replacement surgeries bilaterally and recovering.  She leads a sedentary lifestyle.  She denies any chest pain orthopnea or PND.  Recent blood work reveals markedly elevated lipids, LDL and triglycerides.  She is aware of this.  This was just done on the sixth of the month.  At the time of my evaluation, the patient is alert awake oriented and in no  distress.  Her calcium  score is 0.  Past Medical History:  Diagnosis Date   Achilles tendonosis 10/06/2020   Acute cystitis with hematuria 09/29/2019   Acute infection of nasal sinus 06/21/2021   Allergic rhinoconjunctivitis    Anxiety    Asthma    Cardiac murmur 05/04/2022   Cataract    Cervical os stenosis 04/09/2015   Chest pain    Chest pain of uncertain etiology 05/04/2022   Chronic knee pain after total replacement of right knee joint 07/21/2020   Chronic right shoulder pain 05/25/2021   Depression    Depression, major, recurrent, mild    Diverticulitis    Dysuria 07/12/2021   GERD (gastroesophageal reflux disease)    Glaucoma 04/27/2022   Heart murmur    Hypercholesterolemia    Hypertension    Hypothyroidism    Ingrown nail 02/04/2021   Insomnia    Laryngopharyngeal reflux (LPR)    Memory changes    Migraine without aura and without status migrainosus, not intractable 05/07/2021   Migraines    Mild recurrent major depression 07/12/2021   Mixed hyperlipidemia 12/22/2019   Muscle tension dysphonia 09/12/2018   Muscle wasting and atrophy, not elsewhere classified, left hand 12/01/2020   Nodule of finger of right hand 12/01/2020   Osteoarthritis    Osteopenia    Pain and swelling of right knee 12/25/2018   Postmenopausal atrophic vaginitis 04/09/2015   Prediabetes 12/22/2019   Psoriatic arthritis (HCC)  RBBB    RLS (restless legs syndrome)    Seasonal allergic rhinitis due to pollen 06/21/2021   Secondary hypothyroidism 12/22/2019   Skin cancer    Sleep apnea    Snoring 05/07/2021   Spondylolisthesis at L4-L5 level 04/27/2022   Status post total right knee replacement 12/29/2021   STD (sexually transmitted disease)    HSV II   Tendinopathy of right rotator cuff 05/25/2021   Tightness of heel cord, left 11/10/2020   Unilateral primary osteoarthritis, left knee 09/22/2022   Vocal fold atrophy 09/12/2018    Past Surgical History:  Procedure Laterality  Date   BACK SURGERY  2024   fusion of L4 and L5   BELPHAROPTOSIS REPAIR     BREAST SURGERY Left    times 2   CARDIAC CATHETERIZATION  10/05/2015   COLONOSCOPY  06/03/2013   Moderate predominantly sigmoid diverticulosis. Small interal hemorroids   FOOT SURGERY Right    Removed bone spur   GANGLION CYST EXCISION Left    foot   HEMORRHOID SURGERY     REPLACEMENT TOTAL KNEE Left 06/17/2024   SKIN CANCER EXCISION     TOTAL KNEE ARTHROPLASTY Right 07/21/2020   UPPER GI ENDOSCOPY  03/23/2016   Mild gastritis, gastric polyps bxbenign squamous mucosa with no abnormaility, fundic glad poly in setting of mild chron gastritis.    Current Medications: Active Medications[1]   Allergies:   Codeine, Ambien [zolpidem tartrate], Clonazepam , Cyproheptadine  hcl, Dilaudid  [hydromorphone ], Hydrocodone, Lisinopril, Pramipexole , Sulfa antibiotics, and Tramadol   Social History   Socioeconomic History   Marital status: Married    Spouse name: Not on file   Number of children: 2   Years of education: Not on file   Highest education level: Not on file  Occupational History   Occupation: [employer]  Tobacco Use   Smoking status: Former    Current packs/day: 0.00    Types: Cigarettes    Quit date: 1986    Years since quitting: 40.0   Smokeless tobacco: Never  Vaping Use   Vaping status: Never Used  Substance and Sexual Activity   Alcohol  use: No    Alcohol /week: 0.0 standard drinks of alcohol    Drug use: No   Sexual activity: Yes    Partners: Male    Birth control/protection: Post-menopausal    Comment: husband vasectomy  Other Topics Concern   Not on file  Social History Narrative   Right handed   Drinks caffeine prn   Social Drivers of Health   Tobacco Use: Medium Risk (08/29/2024)   Patient History    Smoking Tobacco Use: Former    Smokeless Tobacco Use: Never    Passive Exposure: Not on Actuary Strain: Low Risk (10/07/2021)   Overall Financial  Resource Strain (CARDIA)    Difficulty of Paying Living Expenses: Not very hard  Food Insecurity: Low Risk (06/17/2024)   Received from [facility]   Epic    Within the past 12 months, you worried that your food would run out before you got money to buy more: Never true    Within the past 12 months, the food you bought just didn't last and you didn't have money to get more. : Never true  Transportation Needs: No Transportation Needs (06/17/2024)   Received from [facility]    In the past 12 months, has lack of reliable transportation kept you from medical appointments, meetings, work or from getting things needed for daily living? :  No  Physical Activity: Not on file  Stress: Not on file  Social Connections: Not on file  Depression (EYV7-0): Medium Risk (09/23/2021)   Depression (PHQ2-9)    PHQ-2 Score: 5  Alcohol  Screen: Not on file  Housing: Low Risk (06/17/2024)   Received from [facility]   Epic    What is your living situation today?: I have a steady place to live    Think about the place you live. Do you have problems with any of the following? Choose all that apply:: None/None on this list  Utilities: Low Risk (06/17/2024)   Received from [facility]   Utilities    In the past 12 months has the electric, gas, oil, or water company threatened to shut off services in your home? : No  Health Literacy: Not on file     Family History: The patient's family history includes Breast cancer in her sister; Cancer in her maternal grandmother and paternal grandmother; Depression in her mother; Diverticulitis in her father; Glaucoma in her father; Heart disease in her mother; Heart failure in her mother; Hyperlipidemia in her mother; Hypertension in her mother; Multiple births in her maternal grandmother and paternal grandmother; Parkinson's disease in her father; Stroke in her mother; Thyroid  disease in her mother. There is no history of Colon cancer, Esophageal  cancer, Pancreatic cancer, Stomach cancer, or Rectal cancer.  ROS:   Please see the history of present illness.    All other systems reviewed and are negative.  EKGs/Labs/Other Studies Reviewed:    The following studies were reviewed today: I discussed my findings with the patient at length   Recent Labs: 09/11/2023: BUN 10; Creatinine, Ser 0.69; Potassium 4.2; Sodium 143 09/18/2023: TSH 1.90 01/22/2024: ALT 21; Hemoglobin 14.5; Platelets 260  Recent Lipid Panel    Component Value Date/Time   CHOL 114 07/12/2021 0803   TRIG 93 07/12/2021 0803   HDL 52 07/12/2021 0803   CHOLHDL 2.2 07/12/2021 0803   CHOLHDL 2.1 02/18/2013 1029   VLDL 16 02/18/2013 1029   LDLCALC 44 07/12/2021 0803   LDLDIRECT 105 (H) 09/11/2023 1423    Physical Exam:    VS:  BP 130/70   Pulse 80   Ht 5' 1 (1.549 m)   Wt 155 lb 3.2 oz (70.4 kg)   LMP 08/22/2005   SpO2 97%   BMI 29.32 kg/m     Wt Readings from Last 3 Encounters:  08/29/24 155 lb 3.2 oz (70.4 kg)  04/11/24 148 lb (67.1 kg)  02/13/24 148 lb (67.1 kg)     GEN: Patient is in no acute distress HEENT: Normal NECK: No JVD; No carotid bruits LYMPHATICS: No lymphadenopathy CARDIAC: Hear sounds regular, 2/6 systolic murmur at the apex. RESPIRATORY:  Clear to auscultation without rales, wheezing or rhonchi  ABDOMEN: Soft, non-tender, non-distended MUSCULOSKELETAL:  No edema; No deformity  SKIN: Warm and dry NEUROLOGIC:  Alert and oriented x 3 PSYCHIATRIC:  Normal affect   Signed, Jennifer JONELLE Crape, MD  08/29/2024 1:46 PM    [facility]     [1]  Current Meds  Medication Sig   Adalimumab (HUMIRA PEN) 40 MG/0.4ML PNKT Inject 40 mg into the skin once a week. Unsure if dose is 40 mg- every other week   beclomethasone (QVAR  REDIHALER) 40 MCG/ACT inhaler INHALE 2 INHALATIONS BY MOUTH 1  TO 2 TIMES DAILY TO PREVENT  COUGH OR WHEEZE. RINSE, GARGLE,  AND SPIT AFTER USE   Biotin w/ Vitamins C &  E (HAIR/SKIN/NAILS PO) Take  1 tablet by mouth daily.   budesonide  (RHINOCORT  AQUA) 32 MCG/ACT nasal spray Place 1 spray into both nostrils daily. Reported on 02/04/2016   CALCIUM  MAGNESIUM  ZINC PO Take by mouth.   cetirizine  (ZYRTEC ) 10 MG tablet Take 1 tablet (10 mg total) by mouth daily as needed for allergies.   ciclopirox  (PENLAC ) 8 % solution Apply topically at bedtime. Apply over nail and surrounding skin. Apply daily over previous coat. After seven (7) days, may remove with alcohol  and continue cycle.   clindamycin (CLEOCIN) 300 MG capsule Take 300 mg by mouth as needed. AS NEEDED FOR DENTAL PROCEDURE   diclofenac  Sodium (VOLTAREN ) 1 % GEL Apply 1 Application topically as needed for pain (Knee pain).   DULoxetine  (CYMBALTA ) 30 MG capsule 1 capsule 2 (two) times daily.   famotidine  (PEPCID ) 40 MG tablet Take 1 tablet (40 mg total) by mouth at bedtime.   levothyroxine  (SYNTHROID ) 50 MCG tablet TAKE 1 TABLET BY MOUTH DAILY   melatonin 5 MG TABS Take 5 mg by mouth at bedtime as needed (sleep).   methocarbamol  (ROBAXIN ) 500 MG tablet Take 1 tablet (500 mg total) by mouth every 6 (six) hours as needed for muscle spasms.   Multiple Vitamin (MULTIVITAMIN WITH MINERALS) TABS tablet Take 1 tablet by mouth daily. Woman 50+   nitrofurantoin  (MACRODANTIN ) 100 MG capsule Take 100 mg by mouth as needed (after  intercourse).   nitroGLYCERIN  (NITROSTAT ) 0.4 MG SL tablet Place 1 tablet (0.4 mg total) under the tongue every 5 (five) minutes as needed for chest pain.   pantoprazole  (PROTONIX ) 40 MG tablet TAKE 1 TABLET BY MOUTH TWICE A DAY   pregabalin (LYRICA) 75 MG capsule Take 75 mg by mouth 2 (two) times daily.   Propylene Glycol, PF, (SYSTANE COMPLETE PF) 0.6 % SOLN Place 1 drop into both eyes daily as needed (dry eyes).   valACYclovir  (VALTREX ) 500 MG tablet Take 1 tablet (500 mg total) by mouth 3 (three) times daily as needed (herpes outbreak).   [DISCONTINUED] ascorbic acid (VITAMIN C) 500 MG tablet Take 500 mg by mouth daily.    [DISCONTINUED] Cyanocobalamin (VITAMIN B 12 PO) Take by mouth daily.   "

## 2024-08-29 NOTE — Patient Instructions (Signed)
 Medication Instructions:  Your physician has recommended you make the following change in your medication:   Start Rosuvastatin  10 mg daily  Start taking fish oil  2 grams twice daily  *If you need a refill on your cardiac medications before your next appointment, please call your pharmacy*   Lab Work: Your physician recommends that you return for lab work in: 6 weeks for fasting lipids and BMP. You need to have labs done when you are fasting.  You can come Monday through Friday 8:30 am to 12:00 pm and 1:15 to 4:30. You do not need to make an appointment as the order has already been placed.   If you have labs (blood work) drawn today and your tests are completely normal, you will receive your results only by: MyChart Message (if you have MyChart) OR A paper copy in the mail If you have any lab test that is abnormal or we need to change your treatment, we will call you to review the results.   Testing/Procedures: None ordered   Follow-Up: At [facility], you and your health needs are our priority.  As part of our continuing mission to provide you with exceptional heart care, we have created designated Provider Care Teams.  These Care Teams include your primary Cardiologist (physician) and Advanced Practice Providers (APPs -  Physician Assistants and Nurse Practitioners) who all work together to provide you with the care you need, when you need it.  We recommend signing up for the patient portal called MyChart.  Sign up information is provided on this After Visit Summary.  MyChart is used to connect with patients for Virtual Visits (Telemedicine).  Patients are able to view lab/test results, encounter notes, upcoming appointments, etc.  Non-urgent messages can be sent to your provider as well.   To learn more about what you can do with MyChart, go to [email or web address].    Your next appointment:   9 month(s)  The format for your next appointment:   In  Person  Provider:   Jennifer Crape, MD    Other Instructions none  Important Information About Sugar

## 2024-08-30 ENCOUNTER — Encounter: Payer: Self-pay | Admitting: Cardiology

## 2024-08-30 DIAGNOSIS — E782 Mixed hyperlipidemia: Secondary | ICD-10-CM

## 2024-08-30 DIAGNOSIS — I10 Essential (primary) hypertension: Secondary | ICD-10-CM

## 2024-09-02 ENCOUNTER — Ambulatory Visit: Admitting: Physician Assistant

## 2024-09-02 ENCOUNTER — Telehealth: Payer: Self-pay

## 2024-09-02 ENCOUNTER — Other Ambulatory Visit: Payer: Self-pay

## 2024-09-02 ENCOUNTER — Encounter: Payer: Self-pay | Admitting: Physician Assistant

## 2024-09-02 VITALS — BP 130/80 | HR 62 | Ht 61.0 in | Wt 155.1 lb

## 2024-09-02 DIAGNOSIS — R159 Full incontinence of feces: Secondary | ICD-10-CM | POA: Diagnosis not present

## 2024-09-02 DIAGNOSIS — K219 Gastro-esophageal reflux disease without esophagitis: Secondary | ICD-10-CM | POA: Diagnosis not present

## 2024-09-02 DIAGNOSIS — Z8719 Personal history of other diseases of the digestive system: Secondary | ICD-10-CM | POA: Diagnosis not present

## 2024-09-02 DIAGNOSIS — R32 Unspecified urinary incontinence: Secondary | ICD-10-CM

## 2024-09-02 DIAGNOSIS — K5900 Constipation, unspecified: Secondary | ICD-10-CM

## 2024-09-02 DIAGNOSIS — M6289 Other specified disorders of muscle: Secondary | ICD-10-CM

## 2024-09-02 MED ORDER — LINACLOTIDE 72 MCG PO CAPS: 72.0000 ug | ORAL_CAPSULE | Freq: Every day | ORAL | 3 refills | Status: DC

## 2024-09-02 MED ORDER — LINACLOTIDE 72 MCG PO CAPS: ORAL_CAPSULE | ORAL | 0 refills | Status: DC

## 2024-09-02 NOTE — Telephone Encounter (Signed)
 I spoke to Olivia Cruz and updated the procedure for 12/11/24 to Anorectal manometry cpt code 08879.

## 2024-09-02 NOTE — Addendum Note (Signed)
 Addended by: WILLIEMAE JOLA PARAS on: 09/02/2024 11:29 AM   Modules accepted: Orders

## 2024-09-02 NOTE — Progress Notes (Signed)
 "    09/02/2024 Olivia Cruz [id number] 02-04-1951  Referring provider: Sun, Vyvyan, MD Primary GI doctor: Dr. Charlanne  ASSESSMENT AND PLAN:  Fecal incontinence with constipation History of hemorrhoidectomy and lumbar fusion 2024, urinary incontinence 07/2017 CT abdomen pelvis [facility] diverticulitis, diverticular hypertrophy 09/18/2023 KUB moderate stool burden without bowel obstruction 11/17/2023 KUB formed stool throughout the colon 06/24/2024 KUB constipation moderate formed stool - linzess  71 after bowel purge - ARM for evaluation  - pelvic floor PT referral placed - close follow up  CCS 04/11/2024 colonoscopy moderate to severe sigmoid diverticulosis nonbleeding internal hemorrhoids normal TI Recall 04/10/2034  GERD with LPR 03/2016 EGD mild gastritis benign gastric polyps negative H. Pylori No symptoms at this time  Psoriatic arthritis On Humira  Patient Care Team: Sun, Vyvyan, MD as PCP - General (Family Medicine) Revankar, Jennifer SAUNDERS, MD as PCP - Cardiology (Cardiology) Trudy Lynwood DASEN, MD as Referring Physician (Dermatology) Maree Korene PEDLAR, MD as Referring Physician (Rheumatology) Delene Rush, MD as Referring Physician (Orthopedic Surgery) Myrick Elspeth PARAS., DPM (Podiatry)  HISTORY OF PRESENT ILLNESS: 74 y.o. female with a past medical history listed below presents for evaluation of fecal incontinence.   Last seen in the office 02/13/2024 by Lauraine Pa, PA for bowel incontinence.  Discussed the use of AI scribe software for clinical note transcription with the patient, who gave verbal consent to proceed.  History of Present Illness   Olivia Cruz is a 74 year old female with chronic constipation and pelvic floor dysfunction who presents for evaluation of worsening fecal incontinence.  Progressive bowel dysfunction began after a back fusion in 2024. Initially, she experienced severe constipation with hard, pellet-like stools, sometimes requiring  manual disimpaction. Over time, stool consistency became softer with occasional small black specks. She currently has regular bowel movements but reports persistent passage of stool after the first morning bowel movement. Previous treatments include Miralax , herbal supplements, fiber tablets, and Benefiber, with only temporary improvement after colonoscopy. Multiple abdominal x-rays, most recently in November 2025, have shown stool present. She has not undergone a bowel cleanse except for colonoscopy preparation.  Fecal incontinence is typically small in volume and often discovered on her pad when using the bathroom. Larger volume incontinence occurs occasionally, such as last night while sitting and watching TV, without her awareness. Incontinence is more likely with increased intra-abdominal pressure, such as bending over. Nighttime episodes are rare, with only one occurrence. Most days, sitting to urinate results in passage of some stool. She describes the fecal incontinence as embarrassing and terrible, with significant impact on her quality of life. Passing gas can result in stool leakage. She endorses some gas and bloating.  Longstanding urinary incontinence has been evaluated by urology, including urodynamic testing. She has tried medications and Kegel exercises without significant improvement and did not pursue physical therapy. She often feels incomplete bladder emptying and sometimes manually compresses her bladder to void more completely. Frequent urination is present, and she has worn pads for years. No vaginal heaviness or protrusion reported.  Surgical history includes hemorrhoidectomy at age 81-23 and two vaginal deliveries of small birth weight infants without traumatic delivery. After knee replacement surgery, she experienced severe constipation requiring urgent care and use of Senokot. She denies urgency with bowel movements, abdominal pain, fevers, chills, nausea, or significant trauma  during childbirth. She does not drink milk, occasionally eats yogurt, and does not like ice cream.        She  reports that she quit smoking about  40 years ago. Her smoking use included cigarettes. She has never used smokeless tobacco. She reports that she does not drink alcohol  and does not use drugs.  RELEVANT GI HISTORY, IMAGING AND LABS: Results         Colonoscopy 12/07/2017 - Moderate to severe sigmoid diverticulosis.  - Small internal hemorrhoids  - Otherwise normal colonoscopy to terminal ileum.   EGD 03/23/2016 - Mild gastritis - Incidental gastric polyps (status post polypectomy x 3) - Otherwise normal EGD Path: Esophagus, biopsy Benign squamous mucosa with no histopathologic abnormality Negative for eosinophilic esophagitis, dysplasia, or malignancy Stomach, polyps, gastric Fundic gland polyps in the setting of mild chronic gastritis A Warthin-Starry stain is negative for H. pylori Negative for intestinal metaplasia or malignancy CBC    Component Value Date/Time   WBC 5.5 01/22/2024 1642   WBC 13.5 (H) 01/31/2023 0742   RBC 4.80 01/22/2024 1642   RBC 3.83 (L) 01/31/2023 0742   HGB 14.5 01/22/2024 1642   HGB 14.2 02/19/2014 1453   HCT 43.4 01/22/2024 1642   PLT 260 01/22/2024 1642   MCV 90 01/22/2024 1642   MCH 30.2 01/22/2024 1642   MCH 29.8 01/31/2023 0742   MCHC 33.4 01/22/2024 1642   MCHC 33.3 01/31/2023 0742   RDW 13.7 01/22/2024 1642   LYMPHSABS 2.3 09/11/2023 1429   EOSABS 0.2 09/11/2023 1429   BASOSABS 0.0 09/11/2023 1429   Recent Labs    09/11/23 1429 01/22/24 1642  HGB 14.8 14.5    CMP     Component Value Date/Time   NA 143 09/11/2023 1429   K 4.2 09/11/2023 1429   CL 104 09/11/2023 1429   CO2 23 09/11/2023 1429   GLUCOSE 100 (H) 09/11/2023 1429   GLUCOSE 142 (H) 01/31/2023 0742   BUN 10 09/11/2023 1429   CREATININE 0.69 09/11/2023 1429   CREATININE 0.71 02/18/2013 1029   CALCIUM  9.7 09/11/2023 1429   PROT 6.6 01/22/2024 1642    ALBUMIN  4.3 01/22/2024 1642   AST 28 01/22/2024 1642   ALT 21 01/22/2024 1642   ALKPHOS 114 01/22/2024 1642   BILITOT 0.2 01/22/2024 1642   GFRNONAA >60 01/31/2023 0742   GFRAA 102 09/15/2020 1101      Latest Ref Rng & Units 01/22/2024    4:42 PM 09/11/2023    2:29 PM 06/06/2023    1:25 PM  Hepatic Function  Total Protein 6.0 - 8.5 g/dL 6.6  6.9  6.8   Albumin  3.8 - 4.8 g/dL 4.3  4.4  4.3   AST 0 - 40 IU/L 28  27  30    ALT 0 - 32 IU/L 21  26  21    Alk Phosphatase 44 - 121 IU/L 114  129  121   Total Bilirubin 0.0 - 1.2 mg/dL 0.2  0.3  0.4   Bilirubin, Direct 0.00 - 0.40 mg/dL <9.91         Current Medications:   Current Outpatient Medications (Endocrine & Metabolic):    levothyroxine  (SYNTHROID ) 50 MCG tablet, TAKE 1 TABLET BY MOUTH DAILY  Current Outpatient Medications (Cardiovascular):    nitroGLYCERIN  (NITROSTAT ) 0.4 MG SL tablet, Place 1 tablet (0.4 mg total) under the tongue every 5 (five) minutes as needed for chest pain.   rosuvastatin  (CRESTOR ) 10 MG tablet, Take 1 tablet (10 mg total) by mouth daily.  Current Outpatient Medications (Respiratory):    beclomethasone (QVAR  REDIHALER) 40 MCG/ACT inhaler, INHALE 2 INHALATIONS BY MOUTH 1  TO 2 TIMES DAILY TO PREVENT  COUGH OR  WHEEZE. RINSE, GARGLE,  AND SPIT AFTER USE   budesonide  (RHINOCORT  AQUA) 32 MCG/ACT nasal spray, Place 1 spray into both nostrils daily. Reported on 02/04/2016   cetirizine  (ZYRTEC ) 10 MG tablet, Take 1 tablet (10 mg total) by mouth daily as needed for allergies.  Current Outpatient Medications (Analgesics):    Adalimumab (HUMIRA PEN) 40 MG/0.4ML PNKT, Inject 40 mg into the skin once a week. Unsure if dose is 40 mg- every other week   meloxicam  (MOBIC ) 15 MG tablet, Take 15 mg by mouth daily.  Current Outpatient Medications (Other):    Biotin w/ Vitamins C & E (HAIR/SKIN/NAILS PO), Take 1 tablet by mouth daily.   CALCIUM  MAGNESIUM  ZINC PO, Take by mouth.   ciclopirox  (PENLAC ) 8 % solution, Apply  topically at bedtime. Apply over nail and surrounding skin. Apply daily over previous coat. After seven (7) days, may remove with alcohol  and continue cycle.   clindamycin (CLEOCIN) 300 MG capsule, Take 300 mg by mouth as needed. AS NEEDED FOR DENTAL PROCEDURE   diclofenac  Sodium (VOLTAREN ) 1 % GEL, Apply 1 Application topically as needed for pain (Knee pain).   DULoxetine  (CYMBALTA ) 30 MG capsule, 1 capsule 2 (two) times daily.   famotidine  (PEPCID ) 40 MG tablet, Take 1 tablet (40 mg total) by mouth at bedtime.   linaclotide  (LINZESS ) 72 MCG capsule, Take 1 capsule daily before meals.   linaclotide  (LINZESS ) 72 MCG capsule, Take 1 capsule (72 mcg total) by mouth daily before breakfast. Take 1 capsule daily before meals.   melatonin 5 MG TABS, Take 5 mg by mouth at bedtime as needed (sleep).   methocarbamol  (ROBAXIN ) 500 MG tablet, Take 1 tablet (500 mg total) by mouth every 6 (six) hours as needed for muscle spasms.   Multiple Vitamin (MULTIVITAMIN WITH MINERALS) TABS tablet, Take 1 tablet by mouth daily. Woman 50+   nitrofurantoin  (MACRODANTIN ) 100 MG capsule, Take 100 mg by mouth as needed (after  intercourse).   Omega-3 Fatty Acids (FISH OIL ) 1000 MG CAPS, Take 2 capsules (2,000 mg total) by mouth 2 (two) times daily.   pantoprazole  (PROTONIX ) 40 MG tablet, TAKE 1 TABLET BY MOUTH TWICE A DAY   pregabalin (LYRICA) 75 MG capsule, Take 75 mg by mouth 2 (two) times daily.   Propylene Glycol, PF, (SYSTANE COMPLETE PF) 0.6 % SOLN, Place 1 drop into both eyes daily as needed (dry eyes).   valACYclovir  (VALTREX ) 500 MG tablet, Take 1 tablet (500 mg total) by mouth 3 (three) times daily as needed (herpes outbreak).  Medical History:  Past Medical History:  Diagnosis Date   Achilles tendonosis 10/06/2020   Acute cystitis with hematuria 09/29/2019   Acute infection of nasal sinus 06/21/2021   Allergic rhinoconjunctivitis    Anxiety    Asthma    Cardiac murmur 05/04/2022   Cataract    Cervical os  stenosis 04/09/2015   Chest pain    Chest pain of uncertain etiology 05/04/2022   Chronic knee pain after total replacement of right knee joint 07/21/2020   Chronic right shoulder pain 05/25/2021   Depression    Depression, major, recurrent, mild    Diverticulitis    Dysuria 07/12/2021   GERD (gastroesophageal reflux disease)    Glaucoma 04/27/2022   Heart murmur    Hypercholesterolemia    Hypertension    Hypothyroidism    Ingrown nail 02/04/2021   Insomnia    Laryngopharyngeal reflux (LPR)    Memory changes    Migraine without aura and without status  migrainosus, not intractable 05/07/2021   Migraines    Mild recurrent major depression 07/12/2021   Mixed hyperlipidemia 12/22/2019   Muscle tension dysphonia 09/12/2018   Muscle wasting and atrophy, not elsewhere classified, left hand 12/01/2020   Nodule of finger of right hand 12/01/2020   Osteoarthritis    Osteopenia    Pain and swelling of right knee 12/25/2018   Postmenopausal atrophic vaginitis 04/09/2015   Prediabetes 12/22/2019   Psoriatic arthritis (HCC)    RBBB    RLS (restless legs syndrome)    Seasonal allergic rhinitis due to pollen 06/21/2021   Secondary hypothyroidism 12/22/2019   Skin cancer    Sleep apnea    Snoring 05/07/2021   Spondylolisthesis at L4-L5 level 04/27/2022   Status post total right knee replacement 12/29/2021   STD (sexually transmitted disease)    HSV II   Tendinopathy of right rotator cuff 05/25/2021   Tightness of heel cord, left 11/10/2020   Unilateral primary osteoarthritis, left knee 09/22/2022   Vocal fold atrophy 09/12/2018   Allergies: Allergies[1]   Surgical History:  She  has a past surgical history that includes Breast surgery (Left); Ganglion cyst excision (Left); Hemorrhoid surgery; Foot surgery (Right); Upper gi endoscopy (03/23/2016); Colonoscopy (06/03/2013); Skin cancer excision; Blepharoptosis repair; Total knee arthroplasty (Right, 07/21/2020); Cardiac  catheterization (10/05/2015); Back surgery (2024); and Replacement total knee (Left, 06/17/2024). Family History:  Her family history includes Breast cancer in her sister; Cancer in her maternal grandmother and paternal grandmother; Depression in her mother; Diverticulitis in her father; Glaucoma in her father; Heart disease in her mother; Heart failure in her mother; Hyperlipidemia in her mother; Hypertension in her mother; Multiple births in her maternal grandmother and paternal grandmother; Parkinson's disease in her father; Stroke in her mother; Thyroid  disease in her mother.  REVIEW OF SYSTEMS  : All other systems reviewed and negative except where noted in the History of Present Illness.  PHYSICAL EXAM: BP 130/80   Pulse 62   Ht 5' 1 (1.549 m)   Wt 155 lb 2 oz (70.4 kg)   LMP 08/22/2005   BMI 29.31 kg/m  Physical Exam   GENERAL APPEARANCE: Well nourished, in no apparent distress. HEENT: No cervical lymphadenopathy, unremarkable thyroid , sclerae anicteric, conjunctiva pink. RESPIRATORY: Respiratory effort normal, breath sounds equal bilaterally without rales, rhonchi, or wheezing. CARDIO: Regular rate and rhythm with no murmurs, rubs, or gallops, peripheral pulses intact. ABDOMEN: Soft, non-distended, active bowel sounds in all four quadrants, no tenderness to palpation, no rebound, no mass appreciated. RECTAL: Minimal stool around rectum, decreased rectal tone, soft stool present, no rectal masses, hemorrhoidal skin tag present. MUSCULOSKELETAL: Full range of motion, normal gait, without edema. SKIN: Dry, intact without rashes or lesions. No jaundice. NEURO: Alert, oriented, no focal deficits. PSYCH: Cooperative, normal mood and affect.      Alan JONELLE Coombs, PA-C 10:22 AM      [1]  Allergies Allergen Reactions   Codeine Anaphylaxis and Nausea Only   Ambien [Zolpidem Tartrate] Other (See Comments)    Memory loss per patient   Clonazepam  Other (See Comments)    Other  Reaction(s): Fatique, Mental Status Changes   Cyproheptadine  Hcl Other (See Comments)    Confusion   Dilaudid  [Hydromorphone ] Other (See Comments)    hallucinations   Hydrocodone Other (See Comments)    anaphylaxis   Lisinopril Cough   Pramipexole  Nausea Only   Sulfa Antibiotics Other (See Comments)    hives   Tramadol Nausea And Vomiting   "

## 2024-09-02 NOTE — Patient Instructions (Addendum)
 You have been scheduled to have an anorectal manometry at [facility] on Wednesday, 12/11/24 at 10:30 am. Please arrive 30 minutes prior to your appointment time for registration (1st floor of the hospital-admissions).  Please make certain to use 1 Fleets enema 2 hours prior to coming for your appointment. You can purchase Fleets enemas from the laxative section at your drug store. You should not eat anything during the two hours prior to the procedure. You may take regular medications with small sips of water at least 2 hours prior to the study.  Anorectal manometry is a test performed to evaluate patients with constipation or fecal incontinence. This test measures the pressures of the anal sphincter muscles, the sensation in the rectum, and the neural reflexes that are needed for normal bowel movements.  THE PROCEDURE The test takes approximately 30 minutes to 1 hour. You will be asked to change into a hospital gown. A technician or nurse will explain the procedure to you, take a brief health history, and answer any questions you may have. The patient then lies on his or her left side. A small, flexible tube, about the size of a thermometer, with a balloon at the end is inserted into the rectum. The catheter is connected to a machine that measures the pressure. During the test, the small balloon attached to the catheter may be inflated in the rectum to assess the normal reflex pathways. The nurse or technician may also ask the person to squeeze, relax, and push at various times. The anal sphincter muscle pressures are measured during each of these maneuvers. To squeeze, the patient tightens the sphincter muscles as if trying to prevent anything from coming out. To push or bear down, the patient strains down as if trying to have a bowel movement.   I am recommending a bowel purge to aid in cleaning out your bowels.   OVER THE COUNTER SHOPPING GUIDE:  Purchase 1 (one) 119 GRAM bottle of  Miralax  Purchase 1 (one) box of 5 mg Dulcolax tablets Purchase 1 (one) 32 ounce bottle of Gatorade  Bowel Purge Steps:  In the morning, mix the entire bottle of Miralax  in the 32 ounces of Gatorade, then refrigerate to dissolve completely. Take 4 dulcolax tablets, then wait 1 hour After 1 hour, drink the Miralax  solution you have prepared over the next 2-3 hours. You should expect results within 1 to 6 hours after completing this bowel purge. Go to the ER if you have severe AB pain, can not pass gas or stool in over 12 hours, can not hold down any food.  This will aid in cleaning out your bowels  I would also suggest an over the counter fleet enema at this same time.     We will refer you to pelvic floor physical therapy.    Linzess  72 mcg start after the bowel purge *IBS-C patients may begin to experience relief from belly pain and overall abdominal symptoms (pain, discomfort, and bloating) in about 1 week,  with symptoms typically improving over 12 weeks.  Take at least 30 minutes before the first meal of the day on an empty stomach You can have a loose stool if you eat a high-fat breakfast. Give it at least 7 days, may have more bowel movements during that time.   The diarrhea should go away and you should start having normal, complete, full bowel movements.  It may be helpful to start treatment when you can be near the comfort of your  own bathroom, such as a weekend.  After you are out we can send in a prescription if you did well, there is a prescription card  First do a trial off milk/lactose products if you use them.  Add fiber like benefiber or citracel once a day    FODMAP stands for fermentable oligo-, di-, mono-saccharides and polyols (1). These are the scientific terms used to classify groups of carbs that are difficult for our body to digest and that are notorious for triggering digestive symptoms like bloating, gas, loose stools and stomach pain.   You can try low  FODMAP diet  - start with eliminating just one column at a time that you feel may be a trigger for you. - the table at the very bottom contains foods that are low in FODMAPs   Sometimes trying to eliminate the FODMAP's from your diet is difficult or tricky, if you are stuggling with trying to do the elimination diet you can try an enzyme.  There is a food enzymes that you sprinkle in or on your food that helps break down the FODMAP. You can read more about the enzyme by going to this site: https://fodzyme.com/    Thank you for trusting me with your gastrointestinal care!   Alan Coombs, PA-C  _______________________________________________________  If your blood pressure at your visit was 140/90 or greater, please contact your primary care physician to follow up on this.  _______________________________________________________  If you are age 75 or older, your body mass index should be between 23-30. Your Body mass index is 29.31 kg/m. If this is out of the aforementioned range listed, please consider follow up with your Primary Care Provider.  If you are age 79 or younger, your body mass index should be between 19-25. Your Body mass index is 29.31 kg/m. If this is out of the aformentioned range listed, please consider follow up with your Primary Care Provider.   ________________________________________________________  The [place] [facility] providers would like to encourage you to use MYCHART to communicate with providers for non-urgent requests or questions.  Due to long hold times on the telephone, sending your provider a message by [facility] may be a faster and more efficient way to get a response.  Please allow 48 business hours for a response.  Please remember that this is for non-urgent requests.  _______________________________________________________  [facility] is using a team-based approach to care.  Your team is made up of your doctor and two to three APPS. Our  APPS (Nurse Practitioners and Physician Assistants) work with your physician to ensure care continuity for you. They are fully qualified to address your health concerns and develop a treatment plan. They communicate directly with your gastroenterologist to care for you. Seeing the Advanced Practice Practitioners on your physician's team can help you by facilitating care more promptly, often allowing for earlier appointments, access to diagnostic testing, procedures, and other specialty referrals.

## 2024-09-03 ENCOUNTER — Ambulatory Visit: Admitting: Podiatry

## 2024-09-05 ENCOUNTER — Other Ambulatory Visit: Payer: Self-pay

## 2024-09-05 ENCOUNTER — Encounter: Payer: Self-pay | Admitting: Physical Therapy

## 2024-09-05 ENCOUNTER — Encounter: Attending: Physician Assistant | Admitting: Physical Therapy

## 2024-09-05 DIAGNOSIS — K5900 Constipation, unspecified: Secondary | ICD-10-CM | POA: Insufficient documentation

## 2024-09-05 DIAGNOSIS — M6281 Muscle weakness (generalized): Secondary | ICD-10-CM

## 2024-09-05 DIAGNOSIS — R32 Unspecified urinary incontinence: Secondary | ICD-10-CM | POA: Insufficient documentation

## 2024-09-05 DIAGNOSIS — R278 Other lack of coordination: Secondary | ICD-10-CM

## 2024-09-05 DIAGNOSIS — M6289 Other specified disorders of muscle: Secondary | ICD-10-CM | POA: Insufficient documentation

## 2024-09-05 DIAGNOSIS — R159 Full incontinence of feces: Secondary | ICD-10-CM | POA: Insufficient documentation

## 2024-09-05 NOTE — Patient Instructions (Signed)
 How To Poop Better:  What are Good Poops? There is no one exact normal, but they should be REGULAR.  This varies from person to person and ranges from up to 3x/day or as little as 3-4/week.  This should stay consistent for you.  They should be formed and ideally one solid mass that doesn't fall apart or dissolve in the water and is brown in color.  Lifestyle Tips:  Fiber: Eat 25-31 grams per day Do not hold it.  If you need to go, GO! Try to go every day around the same time Walk and move more Probiotics for more healthy gut bacteria Water and fluids: half of your healthy body weight in ounces  Toileting Tips:  Posture: knees above hips, back flat, look straight ahead, RELAX Relax all the muscles from your face down to your toes Breathe: slow deep breaths into your belly and pelvic floor is RELAXED Blow: Tighten belly and blow like blowing up a balloon, make "SH" sound, make a vowel sound with a deep voice Do NOT sit more than 10 minutes After you are finished, tighten the muscles to reset pelvic floor back to normal    Moisturizers They are used in the vagina to hydrate the mucous membrane that make up the vaginal canal. Designed to keep a more normal acid balance (ph) Once placed in the vagina, it will last between two to three days.  Use 2-3 times per week at bedtime  Ingredients to avoid is glycerin and fragrance, can increase chance of infection Should not be used just before sex due to causing irritation Most are gels administered either in a tampon-shaped applicator or as a vaginal suppository. They are non-hormonal.   Types of Moisturizers(internal use)  Vitamin E vaginal suppositories- Whole foods, Amazon Moist Again Coconut oil- can break down condoms, any grocery store (prefer organic) Julva- (Do no use if taking  Tamoxifen) amazon Yes moisturizer- amazon NeuEve Silk , NeuEve Silver for menopausal or over 65 (if have severe vaginal atrophy or cancer treatments use  NeuEve Silk for  1 month than move to [employer])- [employer], [place].com Olive and Bee intimate cream- www.oliveandbee.com.au Mae vaginal moisturizer- Amazon Aloe Good Clean Love Hyaluronic acid Hyalofemme Reveree hyaluronic acid inserts   Creams to use externally on the Vulva area Marathon Oil (good for for cancer patients that had radiation to the area)- [place] or [employer].https://garcia-[email or web address]/ Vulva Balm/ V-magic cream by medicine mama- amazon Julva-amazon Vital "V Wild Yam salve ( help moisturize and help with thinning vulvar area, does have Beeswax MoodMaid Botanical Pro-Meno Wild Yam Cream- Amazon Desert Harvest Gele Cleo by Zane Herald labial moisturizer (Amazon),  Coconut or olive oil aloe Good Clean Love Enchanted Rose by intimate rose  Things to avoid in the vaginal area Do not use things to irritate the vulvar area No lotions just specialized creams for the vulva area- Neogyn, V-magic,  No soaps; can use Aveeno or Calendula cleanser, unscented Dove if needed. Must be gentle No deodorants No douches Good to sleep without underwear to let the vaginal area to air out No scrubbing: spread the lips to let warm water rinse over labias and pat dry   Olivia Cruz, PT [facility] [address] [place], [place] [address] W: [phone] [email or web address]

## 2024-09-05 NOTE — Therapy (Signed)
 " OUTPATIENT PHYSICAL THERAPY FEMALE PELVIC EVALUATION   Patient Name: Olivia Cruz MRN: [id number] DOB:[birth year], 74 y.o., female Today's Date: 09/05/2024  END OF SESSION:  PT End of Session - 09/05/24 1421     Visit Number 1    Date for Recertification  11/28/24    Authorization Type [employer]    Authorization - Visit Number 1    Authorization - Number of Visits 10    PT Start Time 1400    PT Stop Time 1510    PT Time Calculation (min) 70 min    Activity Tolerance Patient tolerated treatment well    Behavior During Therapy [facility] for tasks assessed/performed          Past Medical History:  Diagnosis Date   Achilles tendonosis 10/06/2020   Acute cystitis with hematuria 09/29/2019   Acute infection of nasal sinus 06/21/2021   Allergic rhinoconjunctivitis    Anxiety    Asthma    Cardiac murmur 05/04/2022   Cataract    Cervical os stenosis 04/09/2015   Chest pain    Chest pain of uncertain etiology 05/04/2022   Chronic knee pain after total replacement of right knee joint 07/21/2020   Chronic right shoulder pain 05/25/2021   Depression    Depression, major, recurrent, mild    Diverticulitis    Dysuria 07/12/2021   GERD (gastroesophageal reflux disease)    Glaucoma 04/27/2022   Heart murmur    Hypercholesterolemia    Hypertension    Hypothyroidism    Ingrown nail 02/04/2021   Insomnia    Laryngopharyngeal reflux (LPR)    Memory changes    Migraine without aura and without status migrainosus, not intractable 05/07/2021   Migraines    Mild recurrent major depression 07/12/2021   Mixed hyperlipidemia 12/22/2019   Muscle tension dysphonia 09/12/2018   Muscle wasting and atrophy, not elsewhere classified, left hand 12/01/2020   Nodule of finger of right hand 12/01/2020   Osteoarthritis    Osteopenia    Pain and swelling of right knee 12/25/2018   Postmenopausal atrophic vaginitis 04/09/2015   Prediabetes 12/22/2019   Psoriatic arthritis (HCC)     RBBB    RLS (restless legs syndrome)    Seasonal allergic rhinitis due to pollen 06/21/2021   Secondary hypothyroidism 12/22/2019   Skin cancer    Sleep apnea    Snoring 05/07/2021   Spondylolisthesis at L4-L5 level 04/27/2022   Status post total right knee replacement 12/29/2021   STD (sexually transmitted disease)    HSV II   Tendinopathy of right rotator cuff 05/25/2021   Tightness of heel cord, left 11/10/2020   Unilateral primary osteoarthritis, left knee 09/22/2022   Vocal fold atrophy 09/12/2018   Past Surgical History:  Procedure Laterality Date   BACK SURGERY  2024   fusion of L4 and L5   BELPHAROPTOSIS REPAIR     BREAST SURGERY Left    times 2   CARDIAC CATHETERIZATION  10/05/2015   COLONOSCOPY  06/03/2013   Moderate predominantly sigmoid diverticulosis. Small interal hemorroids   FOOT SURGERY Right    Removed bone spur   GANGLION CYST EXCISION Left    foot   HEMORRHOID SURGERY     REPLACEMENT TOTAL KNEE Left 06/17/2024   SKIN CANCER EXCISION     TOTAL KNEE ARTHROPLASTY Right 07/21/2020   UPPER GI ENDOSCOPY  03/23/2016   Mild gastritis, gastric polyps bxbenign squamous mucosa with no abnormaility, fundic glad poly in setting of mild chron gastritis.  Patient Active Problem List   Diagnosis Date Noted   Obesity (BMI 30.0-34.9) 08/29/2024   Cataract    Hypertension    RBBB    Sleep apnea    Heart murmur    Unilateral primary osteoarthritis, left knee 09/22/2022   Chest pain of uncertain etiology 05/04/2022   Cardiac murmur 05/04/2022   Allergic rhinoconjunctivitis 04/27/2022   Anxiety 04/27/2022   Asthma 04/27/2022   Chest pain 04/27/2022   Depression 04/27/2022   Depression, major, recurrent, mild 04/27/2022   Diverticulitis 04/27/2022   GERD (gastroesophageal reflux disease) 04/27/2022   Glaucoma 04/27/2022   Hypercholesterolemia 04/27/2022   Hypothyroidism 04/27/2022   Insomnia 04/27/2022   Memory changes 04/27/2022   Migraines 04/27/2022    Osteoarthritis 04/27/2022   Osteopenia 04/27/2022   Skin cancer 04/27/2022   STD (sexually transmitted disease) 04/27/2022   Spondylolisthesis at L4-L5 level 04/27/2022   Status post total right knee replacement 12/29/2021   Mild recurrent major depression 07/12/2021   Dysuria 07/12/2021   Acute infection of nasal sinus 06/21/2021   Seasonal allergic rhinitis due to pollen 06/21/2021   Chronic right shoulder pain 05/25/2021   Tendinopathy of right rotator cuff 05/25/2021   Migraine without aura and without status migrainosus, not intractable 05/07/2021   Snoring 05/07/2021   Ingrown nail 02/04/2021   Muscle wasting and atrophy, not elsewhere classified, left hand 12/01/2020   Nodule of finger of right hand 12/01/2020   Tightness of heel cord, left 11/10/2020   Achilles tendonosis 10/06/2020   Chronic knee pain after total replacement of right knee joint 07/21/2020   Secondary hypothyroidism 12/22/2019   Prediabetes 12/22/2019   Mixed hyperlipidemia 12/22/2019   RLS (restless legs syndrome) 12/22/2019   Psoriatic arthritis (HCC) 10/28/2019   Acute cystitis with hematuria 09/29/2019   Pain and swelling of right knee 12/25/2018   Laryngopharyngeal reflux (LPR) 09/12/2018   Vocal fold atrophy 09/12/2018   Muscle tension dysphonia 09/12/2018   Cervical os stenosis 04/09/2015   Postmenopausal atrophic vaginitis 04/09/2015    PCP: Sun, Vyvyan, MD  REFERRING PROVIDER: Craig Alan SAUNDERS, PA-C   REFERRING DIAG:  R15.9 (ICD-10-CM) - Incontinence of feces, unspecified fecal incontinence type  R32 (ICD-10-CM) - Urinary incontinence, unspecified type  M62.89 (ICD-10-CM) - Pelvic floor dysfunction  K59.00 (ICD-10-CM) - Constipation, unspecified constipation type    THERAPY DIAG:  Muscle weakness (generalized) - Plan: PT plan of care cert/re-cert  Other lack of coordination - Plan: PT plan of care cert/re-cert  Urinary incontinence, unspecified type - Plan: PT plan of care  cert/re-cert  Incontinence of feces, unspecified fecal incontinence type - Plan: PT plan of care cert/re-cert  Rationale for Evaluation and Treatment: Rehabilitation  ONSET DATE: 9/24  SUBJECTIVE:  SUBJECTIVE STATEMENT: Patient noticed a hard ball will come out when she did not want it to. Patient has a training and development officer.  Fluid intake: water, decaffeinated coffee, sweet tea   PERTINENT HISTORY:  Medications for current condition: not at this time Surgeries: Hemorrhoid surgery; back surgery; skin cancer Other: Hypothyroidism; Hypertension Sexual abuse: No  PAIN:  Are you having pain? No  PRECAUTIONS: Other: skin cancer  RED FLAGS: None   WEIGHT BEARING RESTRICTIONS: No  FALLS:  Has patient fallen in last 6 months? No  OCCUPATION: retired  ACTIVITY LEVEL : ride exercise bike, exercises  PLOF: Independent  PATIENT GOALS: reduce constipation   BOWEL MOVEMENT: Pain with bowel movement: No Type of bowel movement:Type (Bristol Stool Scale) Type 4, Frequency 6-12 per day, Strain no, and Splinting no Fully empty rectum: No Leakage: Yes: 6-12 times per day                                                  Caused by: sit to stand, bend over Bowel urgency: no Pads: Yes: 4 pads per day Fiber supplement/laxative No  URINATION: Pain with urination: No Fully empty bladder: No has to press on the lower abdomen to empty the bladder, she will feel the urge again after she stands up                                         Post-void dribble: No Stream: Strong Urgency: Yes  Frequency:during the day every 2 s                                                        Nocturia: No   Leakage: Urge to void, Walking to the bathroom, Sneezing, Laughing, Exercise, and Lifting Pads/briefs: Yes: 4  pads  INTERCOURSE:  Ability to have vaginal penetration Yes  Pain with intercourse: Deep Penetration Dryness: Yes  Climax: sometimes Marinoff Scale: 1/3 Lubricant:no  PREGNANCY: Vaginal deliveries 2 Tearing No  PROLAPSE: None   OBJECTIVE:  Note: Objective measures were completed at Evaluation unless otherwise noted.  PATIENT SURVEYS:  PFIQ-7: 92 UIQ-7 48 CRAIG -7 52   COGNITION: Overall cognitive status: Within functional limits for tasks assessed     SENSATION: Light touch: Appears intact    GAIT: Assistive device utilized: None  POSTURE: rounded shoulders and forward head   LUMBARAROM/PROM: lumbar ROM decreased by 25%   LOWER EXTREMITY ROM: bilateral hip ROM is full   LOWER EXTREMITY MMT:  MMT Right eval Left eval  Hip extension 4/5 5/5  Hip abduction 4/5 5/5   (Blank rows = not tested) PALPATION:  Pelvic Alignment: ASIS are equal  Abdominal: tenderness in the abdominal superiorly and lower left quadrant  Diastasis: No Distortion: No  Breathing: difficulty with diaphragmatic breathing Scar tissue: No                External Perineal Exam: paleness around the vulvar and decreased perineal body mobility  Internal Pelvic Floor: tenderness located on bilateral levator ani, obturator internist and tightness along the introitus and decreased contraction of the right vaginal wall; rectally there was tightness in the anococcygeal ligament, along the coccygeus, decreased coccyx movement, and decreased movement of the anterior wall of the rectum. There was stool in the rectum with exam. After the therapist performed manual work to the rectal tissue patient was able to push the therapist finger out of the rectum and increased strength to 4/5.   Patient confirms identification and approves PT to assess internal pelvic floor and treatment Yes All internal or external pelvic floor assessments and/or treatments are completed with  proper hand hygiene and gloves hands. If needed gloves are changed with hand hygiene during patient care time. No emotional/communication barriers or cognitive limitation. Patient is motivated to learn. Patient understands and agrees with treatment goals and plan. PT explains patient will be examined in standing, sitting, and lying down to see how their muscles and joints work. When they are ready, they will be asked to remove their underwear so PT can examine their perineum. The patient is also given the option of providing their own chaperone as one is not provided in our facility. The patient also has the right and is explained the right to defer or refuse any part of the evaluation or treatment including the internal exam. With the patient's consent, PT will use one gloved finger to gently assess the muscles of the pelvic floor, seeing how well it contracts and relaxes and if there is muscle symmetry. After, the patient will get dressed and PT and patient will discuss exam findings and plan of care. PT and patient discuss plan of care, schedule, attendance policy and HEP activities.   PELVIC MMT:   MMT eval  Vaginal 2/5 with more weakness on the right  Internal Anal Sphincter 2/5  External Anal Sphincter 2/5  Puborectalis 2/5  (Blank rows = not tested)          TODAY'S TREATMENT:                                                                                                                              DATE: 09/05/24  EVAL Examination completed, findings reviewed, pt educated on POC, HEP, and female pelvic floor anatomy, reasoning with pelvic floor assessment internally with pt consent. Pt motivated to participate in PT and agreeable to attempt recommendations.     PATIENT EDUCATION:  Education details: Access Code: [email or web address], educated patient on vaginal moisturizers Person educated: Patient Education method: Explanation, Demonstration, Tactile cues, Verbal cues, and Handouts Education  comprehension: verbalized understanding, returned demonstration, verbal cues required, tactile cues required, and needs further education  HOME EXERCISE PROGRAM: 09/05/24 Access Code: [email or web address] URL: [email or web address] Date: 09/05/2024 Prepared by: Channing Pereyra  Exercises - Seated Pelvic Floor Contraction  - 3 x daily - 7 x weekly - 1 sets - 10 reps - 10 sec hold  Patient Education -  Abdominal Massage for Constipation  ASSESSMENT:  CLINICAL IMPRESSION: Patient is a 74 y.o. female who was seen today for physical therapy evaluation and treatment for fecal incontinence, constipation, urinary incontinence, and pelvic floor dysfunction. Patient has had issues since 9/24. Patient will have 6-12 bowel movements per day that are short Type 4. She will leak balls of stool with sit to stand or bending over. She leaks urine with urge to void, walking to the bathroom, sneezing, laughing, exercise, and lifting. Pelvic floor strength is 2/5. She has tenderness vaginally on the levator ani and obturator internist. The vulva is pale looking. Decreased mobility of the introitus and decreased strength on the right compared the left pelvic floor. The rectal area does not have tenderness, instead has tightness in the anterior wall, anococcygeal ligament, coccygeus and decreased mobility of the coccyx. The patients rectal strength increased from 2/5 to 4/5 after the therapist did some manual work. Patient was able to push the therapist finger out of the rectum after the manual work and education on breathing to assist. Patient will benefit from skilled therapy to work on pelvic floor coordination and strength to improve urinary leakage and the ability to reduce the number of bowel movements per day.   OBJECTIVE IMPAIRMENTS: decreased activity tolerance, decreased coordination, decreased strength, and increased fascial restrictions.   ACTIVITY LIMITATIONS: bending, sitting, standing, transfers,  continence, and toileting  PARTICIPATION LIMITATIONS: shopping and community activity  PERSONAL FACTORS: 1-2 comorbidities: Hemorrhoid surgery; back surgery; skin cancer are also affecting patient's functional outcome.   REHAB POTENTIAL: Excellent  CLINICAL DECISION MAKING: Evolving/moderate complexity  EVALUATION COMPLEXITY: Moderate   GOALS: Goals reviewed with patient? Yes  SHORT TERM GOALS: Target date: 10/03/24  Patient educated on how to sit on the commode and breath correctly to have a bowel movement.  Baseline: Goal status: INITIAL  2.  Patient is able to perform diaphragmatic breathing to elongate the pelvic floor.  Baseline:  Goal status: INITIAL  3.  Patient educated on vaginal moisturizers and lubricants to manage the vaginal dryness she is having.  Baseline:  Goal status: INITIAL  4.  Patient educated on urge suppression drill to reduce urgency.  Baseline:  Goal status: INITIAL    LONG TERM GOALS: Target date: 11/28/24  Patient independent with advanced HEP for core and pelvic floor.  Baseline:  Goal status: INITIAL  2.  Patient is able to use the commode 2-3 times per day due to improved coordination of the pelvic floor muscles.  Baseline:  Goal status: INITIAL  3.  Pelvic floor strength >/= 4/5 so she is able to reduce her use of pads to 0-1 per day.  Baseline:  Goal status: INITIAL  4.  Patient is able to bend forward or go from sit to stand without stool leakage.  Baseline:  Goal status: INITIAL  5.  Patient is able to perform the [facility] to reduce urinary leakage with sneezing and laughing to minimal no leakage..  Baseline:  Goal status: INITIAL  6.  Patient is able to contract her pelvic floor in a lengthen position so she will not leak as she lift.  Baseline:  Goal status: INITIAL  PLAN:  PT FREQUENCY: 1-2x/week  PT DURATION: 12 weeks  PLANNED INTERVENTIONS: 97110-Therapeutic exercises, 97530- Therapeutic activity, 97112-  Neuromuscular re-education, 97535- Self Care, 02859- Manual therapy, 20560 (1-2 muscles), 20561 (3+ muscles)- Dry Needling, Patient/Family education, and Biofeedback  PLAN FOR NEXT SESSION: manual work to the vaginal muscles, educated on vaginal lubricants, diaphragmatic breathing,  go over how to have a bowel movement, abdominal work   Channing Pereyra, PT 09/05/24 3:55 PM Channing Pereyra, PT [facility] [address] [place], [place] [address] W: [phone] [email or web address]   "

## 2024-09-06 ENCOUNTER — Other Ambulatory Visit: Payer: Self-pay | Admitting: Physician Assistant

## 2024-09-06 MED ORDER — LINACLOTIDE 72 MCG PO CAPS: 72.0000 ug | ORAL_CAPSULE | Freq: Every day | ORAL | 3 refills | Status: DC

## 2024-09-06 MED ORDER — LUBIPROSTONE 8 MCG PO CAPS: 8.0000 ug | ORAL_CAPSULE | Freq: Two times a day (BID) | ORAL | 0 refills | Status: DC

## 2024-09-06 NOTE — Addendum Note (Signed)
 Addended by: CRAIG PALMA on: 09/06/2024 01:06 PM   Modules accepted: Orders

## 2024-09-09 ENCOUNTER — Other Ambulatory Visit (HOSPITAL_COMMUNITY): Payer: Self-pay

## 2024-09-09 NOTE — Telephone Encounter (Signed)
 Linzess  30 per 30 is $54.22 Amitiza  60 per 30 is $209.47 Trulance 30 per 30 $320.62  From what I can see, the patient still has a small deductible to meet. I'm unable to see what the new co-pays would be after this is met.

## 2024-09-12 ENCOUNTER — Encounter: Admitting: Physical Therapy

## 2024-09-12 ENCOUNTER — Encounter: Payer: Self-pay | Admitting: Physical Therapy

## 2024-09-12 DIAGNOSIS — R32 Unspecified urinary incontinence: Secondary | ICD-10-CM

## 2024-09-12 DIAGNOSIS — R159 Full incontinence of feces: Secondary | ICD-10-CM | POA: Diagnosis not present

## 2024-09-12 DIAGNOSIS — R278 Other lack of coordination: Secondary | ICD-10-CM

## 2024-09-12 DIAGNOSIS — M6281 Muscle weakness (generalized): Secondary | ICD-10-CM

## 2024-09-12 NOTE — Patient Instructions (Addendum)
 Moisturizers They are used in the vagina to hydrate the mucous membrane that make up the vaginal canal. Designed to keep a more normal acid balance (ph) Use daily at bedtime  Ingredients to avoid is glycerin and fragrance, can increase chance of infection   Creams to use externally on the Vulva area Marathon Oil (good for for cancer patients that had radiation to the area)- amazon or [employer] [email or web address] Vulva Balm/ V-magic cream by medicine mama- amazon Julva-amazon Vital V Wild Yam salve ( help moisturize and help with thinning vulvar area, does have Beeswax MoodMaid Botanical Pro-Meno Wild Yam Cream- Amazon Desert Harvest Gele Cleo by Sherrlyn labial moisturizer (Amazon),  Coconut or olive oil aloe Good Clean Love Enchanted Rose by intimate rose Momotarro Bonafide Honeypot  Things to avoid in the vaginal area Do not use things to irritate the vulvar area No lotions just specialized creams for the vulva area- Neogyn, V-magic,  No soaps; can use Aveeno or Calendula cleanser, unscented Dove if needed. Must be gentle No deodorants No douches Good to sleep without underwear to let the vaginal area to air out No scrubbing: spread the lips to let warm water rinse over labias and pat dry   Channing Pereyra, PT [facility] [address] [place], [place] [address] W: [phone] [email or web address]

## 2024-09-12 NOTE — Therapy (Signed)
 [facility] at [facility] [address] [place], [place], [address] Phone: [phone]   Fax:  [phone]  Physical Therapy Treatment  Patient Details  Name: Olivia Cruz MRN: [id number] Date of Birth: April 09, 1951 No data recorded  Encounter Date: 09/12/2024   PT End of Session - 09/12/24 1505     Visit Number 2    Date for Recertification  11/28/24    Authorization Type [employer] team advantage    Authorization - Visit Number 2    Authorization - Number of Visits 10    PT Start Time 1500    PT Stop Time 1545    PT Time Calculation (min) 45 min    Activity Tolerance Patient tolerated treatment well    Behavior During Therapy [facility] for tasks assessed/performed          Past Medical History:  Diagnosis Date   Achilles tendonosis 10/06/2020   Acute cystitis with hematuria 09/29/2019   Acute infection of nasal sinus 06/21/2021   Allergic rhinoconjunctivitis    Anxiety    Asthma    Cardiac murmur 05/04/2022   Cataract    Cervical os stenosis 04/09/2015   Chest pain    Chest pain of uncertain etiology 05/04/2022   Chronic knee pain after total replacement of right knee joint 07/21/2020   Chronic right shoulder pain 05/25/2021   Depression    Depression, major, recurrent, mild    Diverticulitis    Dysuria 07/12/2021   GERD (gastroesophageal reflux disease)    Glaucoma 04/27/2022   Heart murmur    Hypercholesterolemia    Hypertension    Hypothyroidism    Ingrown nail 02/04/2021   Insomnia    Laryngopharyngeal reflux (LPR)    Memory changes    Migraine without aura and without status migrainosus, not intractable 05/07/2021   Migraines    Mild recurrent major depression 07/12/2021   Mixed hyperlipidemia 12/22/2019   Muscle tension dysphonia 09/12/2018   Muscle wasting and atrophy, not elsewhere classified, left hand 12/01/2020   Nodule of finger of right hand 12/01/2020   Osteoarthritis    Osteopenia     Pain and swelling of right knee 12/25/2018   Postmenopausal atrophic vaginitis 04/09/2015   Prediabetes 12/22/2019   Psoriatic arthritis (HCC)    RBBB    RLS (restless legs syndrome)    Seasonal allergic rhinitis due to pollen 06/21/2021   Secondary hypothyroidism 12/22/2019   Skin cancer    Sleep apnea    Snoring 05/07/2021   Spondylolisthesis at L4-L5 level 04/27/2022   Status post total right knee replacement 12/29/2021   STD (sexually transmitted disease)    HSV II   Tendinopathy of right rotator cuff 05/25/2021   Tightness of heel cord, left 11/10/2020   Unilateral primary osteoarthritis, left knee 09/22/2022   Vocal fold atrophy 09/12/2018    Past Surgical History:  Procedure Laterality Date   BACK SURGERY  2024   fusion of L4 and L5   BELPHAROPTOSIS REPAIR     BREAST SURGERY Left    times 2   CARDIAC CATHETERIZATION  10/05/2015   COLONOSCOPY  06/03/2013   Moderate predominantly sigmoid diverticulosis. Small interal hemorroids   FOOT SURGERY Right    Removed bone spur   GANGLION CYST EXCISION Left    foot   HEMORRHOID SURGERY     REPLACEMENT TOTAL KNEE Left 06/17/2024   SKIN CANCER EXCISION     TOTAL KNEE ARTHROPLASTY Right 07/21/2020   UPPER GI  ENDOSCOPY  03/23/2016   Mild gastritis, gastric polyps bxbenign squamous mucosa with no abnormaility, fundic glad poly in setting of mild chron gastritis.    There were no vitals filed for this visit.    Problem List Patient Active Problem List   Diagnosis Date Noted   Obesity (BMI 30.0-34.9) 08/29/2024   Cataract    Hypertension    RBBB    Sleep apnea    Heart murmur    Unilateral primary osteoarthritis, left knee 09/22/2022   Chest pain of uncertain etiology 05/04/2022   Cardiac murmur 05/04/2022   Allergic rhinoconjunctivitis 04/27/2022   Anxiety 04/27/2022   Asthma 04/27/2022   Chest pain 04/27/2022   Depression 04/27/2022   Depression, major, recurrent, mild 04/27/2022   Diverticulitis 04/27/2022    GERD (gastroesophageal reflux disease) 04/27/2022   Glaucoma 04/27/2022   Hypercholesterolemia 04/27/2022   Hypothyroidism 04/27/2022   Insomnia 04/27/2022   Memory changes 04/27/2022   Migraines 04/27/2022   Osteoarthritis 04/27/2022   Osteopenia 04/27/2022   Skin cancer 04/27/2022   STD (sexually transmitted disease) 04/27/2022   Spondylolisthesis at L4-L5 level 04/27/2022   Status post total right knee replacement 12/29/2021   Mild recurrent major depression 07/12/2021   Dysuria 07/12/2021   Acute infection of nasal sinus 06/21/2021   Seasonal allergic rhinitis due to pollen 06/21/2021   Chronic right shoulder pain 05/25/2021   Tendinopathy of right rotator cuff 05/25/2021   Migraine without aura and without status migrainosus, not intractable 05/07/2021   Snoring 05/07/2021   Ingrown nail 02/04/2021   Muscle wasting and atrophy, not elsewhere classified, left hand 12/01/2020   Nodule of finger of right hand 12/01/2020   Tightness of heel cord, left 11/10/2020   Achilles tendonosis 10/06/2020   Chronic knee pain after total replacement of right knee joint 07/21/2020   Secondary hypothyroidism 12/22/2019   Prediabetes 12/22/2019   Mixed hyperlipidemia 12/22/2019   RLS (restless legs syndrome) 12/22/2019   Psoriatic arthritis (HCC) 10/28/2019   Acute cystitis with hematuria 09/29/2019   Pain and swelling of right knee 12/25/2018   Laryngopharyngeal reflux (LPR) 09/12/2018   Vocal fold atrophy 09/12/2018   Muscle tension dysphonia 09/12/2018   Cervical os stenosis 04/09/2015   Postmenopausal atrophic vaginitis 04/09/2015   REFERRING PROVIDER: Craig Alan SAUNDERS, PA-C    REFERRING DIAG:  R15.9 (ICD-10-CM) - Incontinence of feces, unspecified fecal incontinence type  R32 (ICD-10-CM) - Urinary incontinence, unspecified type  M62.89 (ICD-10-CM) - Pelvic floor dysfunction  K59.00 (ICD-10-CM) - Constipation, unspecified constipation type      THERAPY DIAG:  Muscle  weakness (generalized)    Other lack of coordination  Urinary incontinence, unspecified type    Incontinence of feces, unspecified fecal incontinence type    Rationale for Evaluation and Treatment: Rehabilitation   ONSET DATE: 9/24   SUBJECTIVE:  SUBJECTIVE STATEMENT: MD want me to use the fiber. I did a bowel purge over the weekend. I have used all of my Linzess . I have not had accidents from last visit.   Fluid intake: water, decaffeinated coffee, sweet tea     PERTINENT HISTORY:  Medications for current condition: not at this time Surgeries: Hemorrhoid surgery; back surgery; skin cancer Other: Hypothyroidism; Hypertension Sexual abuse: No   PAIN:  Are you having pain? No   PRECAUTIONS: Other: skin cancer   RED FLAGS: None       WEIGHT BEARING RESTRICTIONS: No   FALLS:  Has patient fallen in last 6 months? No   OCCUPATION: retired   ACTIVITY LEVEL : ride exercise bike, exercises   PLOF: Independent   PATIENT GOALS: reduce constipation     BOWEL MOVEMENT: Pain with bowel movement: No Type of bowel movement:Type (Bristol Stool Scale) Type 4, Frequency 6-12 per day, Strain no, and Splinting no Fully empty rectum: No Leakage: Yes: 6-12 times per day                                                  Caused by: sit to stand, bend over Bowel urgency: no Pads: Yes: 4 pads per day Fiber supplement/laxative No   URINATION: Pain with urination: No Fully empty bladder: No has to press on the lower abdomen to empty the bladder, she will feel the urge again after she stands up                                         Post-void dribble: No Stream: Strong Urgency: Yes  Frequency:during the day every 2 s                                                        Nocturia: No   Leakage: Urge  to void, Walking to the bathroom, Sneezing, Laughing, Exercise, and Lifting Pads/briefs: Yes: 4 pads   INTERCOURSE:             Ability to have vaginal penetration Yes  Pain with intercourse: Deep Penetration Dryness: Yes  Climax: sometimes Marinoff Scale: 1/3 Lubricant:no   PREGNANCY: Vaginal deliveries 2 Tearing No   PROLAPSE: None     OBJECTIVE:  Note: Objective measures were completed at Evaluation unless otherwise noted.   PATIENT SURVEYS:  PFIQ-7: 92 UIQ-7 48 CRAIG -7 52     COGNITION: Overall cognitive status: Within functional limits for tasks assessed                          SENSATION: Light touch: Appears intact       GAIT: Assistive device utilized: None   POSTURE: rounded shoulders and forward head     LUMBARAROM/PROM: lumbar ROM decreased by 25%     LOWER EXTREMITY ROM: bilateral hip ROM is full     LOWER EXTREMITY MMT:   MMT Right eval Left eval  Hip extension 4/5 5/5  Hip abduction 4/5 5/5   (Blank rows = not tested) PALPATION:  Pelvic Alignment:  ASIS are equal   Abdominal: tenderness in the abdominal superiorly and lower left quadrant             Diastasis: No Distortion: No  Breathing: difficulty with diaphragmatic breathing Scar tissue: No                 External Perineal Exam: paleness around the vulvar and decreased perineal body mobility                             Internal Pelvic Floor: tenderness located on bilateral levator ani, obturator internist and tightness along the introitus and decreased contraction of the right vaginal wall; rectally there was tightness in the anococcygeal ligament, along the coccygeus, decreased coccyx movement, and decreased movement of the anterior wall of the rectum. There was stool in the rectum with exam. After the therapist performed manual work to the rectal tissue patient was able to push the therapist finger out of the rectum and increased strength to 4/5.    Patient confirms  identification and approves PT to assess internal pelvic floor and treatment Yes All internal or external pelvic floor assessments and/or treatments are completed with proper hand hygiene and gloves hands. If needed gloves are changed with hand hygiene during patient care time. No emotional/communication barriers or cognitive limitation. Patient is motivated to learn. Patient understands and agrees with treatment goals and plan. PT explains patient will be examined in standing, sitting, and lying down to see how their muscles and joints work. When they are ready, they will be asked to remove their underwear so PT can examine their perineum. The patient is also given the option of providing their own chaperone as one is not provided in our facility. The patient also has the right and is explained the right to defer or refuse any part of the evaluation or treatment including the internal exam. With the patient's consent, PT will use one gloved finger to gently assess the muscles of the pelvic floor, seeing how well it contracts and relaxes and if there is muscle symmetry. After, the patient will get dressed and PT and patient will discuss exam findings and plan of care. PT and patient discuss plan of care, schedule, attendance policy and HEP activities.    PELVIC MMT:   MMT eval  Vaginal 2/5 with more weakness on the right  Internal Anal Sphincter 2/5  External Anal Sphincter 2/5  Puborectalis 2/5  (Blank rows = not tested)             TODAY'S TREATMENT:      09/12/24 Manual: Soft tissue mobilization: Circular massage to the abdomen to improve peristalic motion of the intestines Manual work to the diaphragm Myofascial release: Fascial release to the lower abdomen to reduce restrictions Neuromuscular re-education: Core retraining: Transverse abdominus contraction with ball squeeze Therapeutic activities: Functional strengthening activities: Educated patient on sitting on commode with stool,  breathing in to relax the pelvic floor and breathing to push the stool out. Patient able to return demonstration correctly Self-care: Educated patient on vaginal moisturizers and how to apply on the vulvar area  DATE: 09/05/24  EVAL Examination completed, findings reviewed, pt educated on POC, HEP, and female pelvic floor anatomy, reasoning with pelvic floor assessment internally with pt consent. Pt motivated to participate in PT and agreeable to attempt recommendations.        PATIENT EDUCATION:  09/12/24 Education details: Access Code: [email or web address], educated patient on vaginal moisturizers Person educated: Patient Education method: Explanation, Demonstration, Tactile cues, Verbal cues, and Handouts Education comprehension: verbalized understanding, returned demonstration, verbal cues required, tactile cues required, and needs further education   HOME EXERCISE PROGRAM: 09/12/24 Access Code: [email or web address] URL: [email or web address] Date: 09/12/2024 Prepared by: Channing Pereyra  Exercises - Seated Pelvic Floor Contraction  - 3 x daily - 7 x weekly - 1 sets - 10 reps - 10 sec hold - Hooklying Transversus Abdominis Palpation  - 1 x daily - 7 x weekly - 1 sets - 10 reps  Patient Education - Abdominal Massage for Constipation   ASSESSMENT:   CLINICAL IMPRESSION: Patient is a 74 y.o. female who was seen today for physical therapy  treatment for fecal incontinence, constipation, urinary incontinence, and pelvic floor dysfunction.  Patient is able to empty her bladder after therapist taught her how to perform the technique. Patient has not had stool leakage since last visit. She understands how to use the vaginal moisturizer to reduce vaginal dryness. She is learning how to contract the upper and lower abdominal equally. Patient will benefit from skilled therapy to work on  pelvic floor coordination and strength to improve urinary leakage and the ability to reduce the number of bowel movements per day.    OBJECTIVE IMPAIRMENTS: decreased activity tolerance, decreased coordination, decreased strength, and increased fascial restrictions.    ACTIVITY LIMITATIONS: bending, sitting, standing, transfers, continence, and toileting   PARTICIPATION LIMITATIONS: shopping and community activity   PERSONAL FACTORS: 1-2 comorbidities: Hemorrhoid surgery; back surgery; skin cancer are also affecting patient's functional outcome.    REHAB POTENTIAL: Excellent   CLINICAL DECISION MAKING: Evolving/moderate complexity   EVALUATION COMPLEXITY: Moderate     GOALS: Goals reviewed with patient? Yes   SHORT TERM GOALS: Target date: 10/03/24   Patient educated on how to sit on the commode and breath correctly to have a bowel movement.  Baseline: Goal status: Met 09/12/24   2.  Patient is able to perform diaphragmatic breathing to elongate the pelvic floor.  Baseline:  Goal status: INITIAL   3.  Patient educated on vaginal moisturizers and lubricants to manage the vaginal dryness she is having.  Baseline:  Goal status: Met 09/12/24   4.  Patient educated on urge suppression drill to reduce urgency.  Baseline:  Goal status: INITIAL       LONG TERM GOALS: Target date: 11/28/24   Patient independent with advanced HEP for core and pelvic floor.  Baseline:  Goal status: INITIAL   2.  Patient is able to use the commode 2-3 times per day due to improved coordination of the pelvic floor muscles.  Baseline:  Goal status: INITIAL   3.  Pelvic floor strength >/= 4/5 so she is able to reduce her use of pads to 0-1 per day.  Baseline:  Goal status: INITIAL   4.  Patient is able to bend forward or go from sit to stand without stool leakage.  Baseline:  Goal status: INITIAL   5.  Patient is able to perform the [facility] to reduce urinary leakage with sneezing and laughing to  minimal no leakage..  Baseline:  Goal status: INITIAL  6.  Patient is able to contract her pelvic floor in a lengthen position so she will not leak as she lift.  Baseline:  Goal status: INITIAL   PLAN:   PT FREQUENCY: 1-2x/week   PT DURATION: 12 weeks   PLANNED INTERVENTIONS: 97110-Therapeutic exercises, 97530- Therapeutic activity, 97112- Neuromuscular re-education, 97535- Self Care, 02859- Manual therapy, 20560 (1-2 muscles), 20561 (3+ muscles)- Dry Needling, Patient/Family education, and Biofeedback   PLAN FOR NEXT SESSION: manual work to the vaginal muscles, educated on vaginal lubricants, core strength   Channing Pereyra, PT 09/12/24 3:59 PM   [place] [facility] at [facility] [address], [address] [place], [place], [address] Phone: [phone]   Fax:  [phone]  Name: Olivia Cruz MRN: [id number] Date of Birth: 10-09-1950

## 2024-09-20 ENCOUNTER — Telehealth: Payer: Self-pay | Admitting: Cardiology

## 2024-09-20 NOTE — Telephone Encounter (Signed)
 error

## 2024-09-20 NOTE — Telephone Encounter (Signed)
 Pt is wanting to have some updated labs since she has been working eatting habits and excercise

## 2024-09-21 ENCOUNTER — Encounter: Payer: Self-pay | Admitting: Physical Therapy

## 2024-09-24 ENCOUNTER — Encounter: Payer: Self-pay | Admitting: Physical Therapy

## 2024-09-24 ENCOUNTER — Encounter: Admitting: Physical Therapy

## 2024-09-24 DIAGNOSIS — R32 Unspecified urinary incontinence: Secondary | ICD-10-CM

## 2024-09-24 DIAGNOSIS — R159 Full incontinence of feces: Secondary | ICD-10-CM

## 2024-09-24 DIAGNOSIS — R278 Other lack of coordination: Secondary | ICD-10-CM

## 2024-09-24 DIAGNOSIS — M6281 Muscle weakness (generalized): Secondary | ICD-10-CM

## 2024-09-25 NOTE — Addendum Note (Signed)
 Addended by: GLENFORD ALAN CROME on: 09/25/2024 09:01 AM   Modules accepted: Orders

## 2024-10-01 ENCOUNTER — Encounter: Payer: Self-pay | Admitting: Physical Therapy

## 2024-10-28 ENCOUNTER — Ambulatory Visit: Admitting: Pharmacist Clinician (PhC)/ Clinical Pharmacy Specialist

## 2024-11-07 ENCOUNTER — Encounter: Admitting: Physical Therapy

## 2024-11-14 ENCOUNTER — Encounter: Admitting: Physical Therapy

## 2024-11-21 ENCOUNTER — Encounter: Admitting: Physical Therapy

## 2024-12-11 ENCOUNTER — Encounter (HOSPITAL_COMMUNITY): Payer: Self-pay

## 2024-12-11 ENCOUNTER — Ambulatory Visit (HOSPITAL_COMMUNITY): Admit: 2024-12-11 | Admitting: Gastroenterology

## 2024-12-11 SURGERY — MANOMETRY, ANORECTAL
Anesthesia: Monitor Anesthesia Care
